# Patient Record
Sex: Female | Born: 1940
Health system: Southern US, Community
[De-identification: ages and names within clinical notes are randomized; demographics above are authoritative.]

## PROBLEM LIST (undated history)

## (undated) DIAGNOSIS — C50919 Malignant neoplasm of unspecified site of unspecified female breast: Secondary | ICD-10-CM

---

## 2020-02-27 ENCOUNTER — Other Ambulatory Visit: Payer: Self-pay

## 2020-02-27 ENCOUNTER — Encounter (HOSPITAL_COMMUNITY): Payer: Self-pay | Admitting: Family Medicine

## 2020-02-27 ENCOUNTER — Emergency Department (HOSPITAL_COMMUNITY): Payer: Medicare HMO

## 2020-02-27 ENCOUNTER — Inpatient Hospital Stay (HOSPITAL_COMMUNITY)
Admission: EM | Admit: 2020-02-27 | Discharge: 2020-03-05 | DRG: 579 | Disposition: A | Discharge status: Home Health | Payer: Medicare HMO | Attending: Internal Medicine | Admitting: Internal Medicine

## 2020-02-27 DIAGNOSIS — R627 Adult failure to thrive: Secondary | ICD-10-CM | POA: Diagnosis present

## 2020-02-27 DIAGNOSIS — R64 Cachexia: Secondary | ICD-10-CM | POA: Diagnosis not present

## 2020-02-27 DIAGNOSIS — J9811 Atelectasis: Secondary | ICD-10-CM | POA: Diagnosis present

## 2020-02-27 DIAGNOSIS — L89151 Pressure ulcer of sacral region, stage 1: Secondary | ICD-10-CM | POA: Diagnosis present

## 2020-02-27 DIAGNOSIS — E871 Hypo-osmolality and hyponatremia: Secondary | ICD-10-CM | POA: Diagnosis present

## 2020-02-27 DIAGNOSIS — C770 Secondary and unspecified malignant neoplasm of lymph nodes of head, face and neck: Secondary | ICD-10-CM | POA: Diagnosis present

## 2020-02-27 DIAGNOSIS — E876 Hypokalemia: Secondary | ICD-10-CM | POA: Diagnosis present

## 2020-02-27 DIAGNOSIS — C799 Secondary malignant neoplasm of unspecified site: Secondary | ICD-10-CM

## 2020-02-27 DIAGNOSIS — C7951 Secondary malignant neoplasm of bone: Secondary | ICD-10-CM | POA: Diagnosis present

## 2020-02-27 DIAGNOSIS — E162 Hypoglycemia, unspecified: Secondary | ICD-10-CM | POA: Diagnosis present

## 2020-02-27 DIAGNOSIS — I248 Other forms of acute ischemic heart disease: Secondary | ICD-10-CM | POA: Diagnosis present

## 2020-02-27 DIAGNOSIS — R778 Other specified abnormalities of plasma proteins: Secondary | ICD-10-CM | POA: Diagnosis not present

## 2020-02-27 DIAGNOSIS — Z800 Family history of malignant neoplasm of digestive organs: Secondary | ICD-10-CM

## 2020-02-27 DIAGNOSIS — R944 Abnormal results of kidney function studies: Secondary | ICD-10-CM | POA: Diagnosis present

## 2020-02-27 DIAGNOSIS — R000 Tachycardia, unspecified: Secondary | ICD-10-CM | POA: Diagnosis present

## 2020-02-27 DIAGNOSIS — E860 Dehydration: Secondary | ICD-10-CM | POA: Diagnosis present

## 2020-02-27 DIAGNOSIS — Z7189 Other specified counseling: Secondary | ICD-10-CM | POA: Diagnosis not present

## 2020-02-27 DIAGNOSIS — Z681 Body mass index (BMI) 19 or less, adult: Secondary | ICD-10-CM

## 2020-02-27 DIAGNOSIS — Z602 Problems related to living alone: Secondary | ICD-10-CM | POA: Diagnosis present

## 2020-02-27 DIAGNOSIS — R7989 Other specified abnormal findings of blood chemistry: Secondary | ICD-10-CM | POA: Diagnosis present

## 2020-02-27 DIAGNOSIS — R54 Age-related physical debility: Secondary | ICD-10-CM | POA: Diagnosis present

## 2020-02-27 DIAGNOSIS — L8990 Pressure ulcer of unspecified site, unspecified stage: Secondary | ICD-10-CM | POA: Insufficient documentation

## 2020-02-27 DIAGNOSIS — C801 Malignant (primary) neoplasm, unspecified: Secondary | ICD-10-CM | POA: Diagnosis not present

## 2020-02-27 DIAGNOSIS — Z66 Do not resuscitate: Secondary | ICD-10-CM | POA: Diagnosis not present

## 2020-02-27 DIAGNOSIS — Z8249 Family history of ischemic heart disease and other diseases of the circulatory system: Secondary | ICD-10-CM

## 2020-02-27 DIAGNOSIS — E872 Acidosis: Secondary | ICD-10-CM | POA: Diagnosis present

## 2020-02-27 DIAGNOSIS — G9389 Other specified disorders of brain: Secondary | ICD-10-CM | POA: Diagnosis not present

## 2020-02-27 DIAGNOSIS — N6320 Unspecified lump in the left breast, unspecified quadrant: Secondary | ICD-10-CM | POA: Diagnosis present

## 2020-02-27 DIAGNOSIS — N6332 Unspecified lump in axillary tail of the left breast: Secondary | ICD-10-CM | POA: Diagnosis not present

## 2020-02-27 DIAGNOSIS — E43 Unspecified severe protein-calorie malnutrition: Secondary | ICD-10-CM | POA: Diagnosis present

## 2020-02-27 DIAGNOSIS — R0602 Shortness of breath: Secondary | ICD-10-CM | POA: Diagnosis not present

## 2020-02-27 DIAGNOSIS — R531 Weakness: Secondary | ICD-10-CM

## 2020-02-27 DIAGNOSIS — Z20822 Contact with and (suspected) exposure to covid-19: Secondary | ICD-10-CM | POA: Diagnosis not present

## 2020-02-27 DIAGNOSIS — R2232 Localized swelling, mass and lump, left upper limb: Secondary | ICD-10-CM | POA: Diagnosis not present

## 2020-02-27 DIAGNOSIS — C773 Secondary and unspecified malignant neoplasm of axilla and upper limb lymph nodes: Secondary | ICD-10-CM | POA: Diagnosis not present

## 2020-02-27 DIAGNOSIS — C787 Secondary malignant neoplasm of liver and intrahepatic bile duct: Secondary | ICD-10-CM | POA: Diagnosis present

## 2020-02-27 DIAGNOSIS — C50912 Malignant neoplasm of unspecified site of left female breast: Principal | ICD-10-CM | POA: Diagnosis present

## 2020-02-27 DIAGNOSIS — Z170 Estrogen receptor positive status [ER+]: Secondary | ICD-10-CM | POA: Diagnosis not present

## 2020-02-27 DIAGNOSIS — C7801 Secondary malignant neoplasm of right lung: Secondary | ICD-10-CM | POA: Diagnosis not present

## 2020-02-27 DIAGNOSIS — Z803 Family history of malignant neoplasm of breast: Secondary | ICD-10-CM

## 2020-02-27 DIAGNOSIS — R109 Unspecified abdominal pain: Secondary | ICD-10-CM | POA: Diagnosis not present

## 2020-02-27 DIAGNOSIS — K5900 Constipation, unspecified: Secondary | ICD-10-CM | POA: Diagnosis present

## 2020-02-27 DIAGNOSIS — R100 Acute abdomen: Secondary | ICD-10-CM | POA: Diagnosis not present

## 2020-02-27 DIAGNOSIS — J90 Pleural effusion, not elsewhere classified: Secondary | ICD-10-CM | POA: Diagnosis present

## 2020-02-27 DIAGNOSIS — J910 Malignant pleural effusion: Secondary | ICD-10-CM | POA: Diagnosis present

## 2020-02-27 DIAGNOSIS — M5431 Sciatica, right side: Secondary | ICD-10-CM | POA: Diagnosis present

## 2020-02-27 DIAGNOSIS — C7989 Secondary malignant neoplasm of other specified sites: Secondary | ICD-10-CM | POA: Diagnosis not present

## 2020-02-27 DIAGNOSIS — C50919 Malignant neoplasm of unspecified site of unspecified female breast: Secondary | ICD-10-CM | POA: Diagnosis not present

## 2020-02-27 DIAGNOSIS — J984 Other disorders of lung: Secondary | ICD-10-CM | POA: Diagnosis present

## 2020-02-27 DIAGNOSIS — Z515 Encounter for palliative care: Secondary | ICD-10-CM | POA: Diagnosis not present

## 2020-02-27 DIAGNOSIS — Z823 Family history of stroke: Secondary | ICD-10-CM

## 2020-02-27 DIAGNOSIS — Z789 Other specified health status: Secondary | ICD-10-CM | POA: Diagnosis not present

## 2020-02-27 DIAGNOSIS — C3490 Malignant neoplasm of unspecified part of unspecified bronchus or lung: Secondary | ICD-10-CM | POA: Diagnosis not present

## 2020-02-27 DIAGNOSIS — S2231XA Fracture of one rib, right side, initial encounter for closed fracture: Secondary | ICD-10-CM | POA: Diagnosis not present

## 2020-02-27 DIAGNOSIS — R079 Chest pain, unspecified: Secondary | ICD-10-CM | POA: Diagnosis not present

## 2020-02-27 DIAGNOSIS — F40240 Claustrophobia: Secondary | ICD-10-CM | POA: Diagnosis present

## 2020-02-27 DIAGNOSIS — J449 Chronic obstructive pulmonary disease, unspecified: Secondary | ICD-10-CM | POA: Diagnosis not present

## 2020-02-27 LAB — HEPATIC FUNCTION PANEL
ALT: 30 U/L (ref 0–44)
AST: 154 U/L — ABNORMAL HIGH (ref 15–41)
Albumin: 2.6 g/dL — ABNORMAL LOW (ref 3.5–5.0)
Alkaline Phosphatase: 652 U/L — ABNORMAL HIGH (ref 38–126)
Bilirubin, Direct: 0.3 mg/dL — ABNORMAL HIGH (ref 0.0–0.2)
Indirect Bilirubin: 0.3 mg/dL (ref 0.3–0.9)
Total Bilirubin: 0.6 mg/dL (ref 0.3–1.2)
Total Protein: 6.4 g/dL — ABNORMAL LOW (ref 6.5–8.1)

## 2020-02-27 LAB — BASIC METABOLIC PANEL
Anion gap: 16 — ABNORMAL HIGH (ref 5–15)
BUN: 31 mg/dL — ABNORMAL HIGH (ref 8–23)
CO2: 21 mmol/L — ABNORMAL LOW (ref 22–32)
Calcium: 11.1 mg/dL — ABNORMAL HIGH (ref 8.9–10.3)
Chloride: 97 mmol/L — ABNORMAL LOW (ref 98–111)
Creatinine, Ser: 0.95 mg/dL (ref 0.44–1.00)
GFR, Estimated: >60 mL/min (ref >60)
Glucose, Bld: 110 mg/dL — ABNORMAL HIGH (ref 70–99)
Potassium: 4.1 mmol/L (ref 3.5–5.1)
Sodium: 134 mmol/L — ABNORMAL LOW (ref 135–145)

## 2020-02-27 LAB — TSH: TSH: 3.188 u[IU]/mL (ref 0.350–4.500)

## 2020-02-27 LAB — TROPONIN I (HIGH SENSITIVITY)
Troponin I (High Sensitivity): 18 ng/L — ABNORMAL HIGH (ref <18)
Troponin I (High Sensitivity): 18 ng/L — ABNORMAL HIGH (ref <18)

## 2020-02-27 LAB — CBC
HCT: 40.9 % (ref 36.0–46.0)
Hemoglobin: 13.5 g/dL (ref 12.0–15.0)
MCH: 27.4 pg (ref 26.0–34.0)
MCHC: 33 g/dL (ref 30.0–36.0)
MCV: 83.1 fL (ref 80.0–100.0)
Platelets: 394 10*3/uL (ref 150–400)
RBC: 4.92 MIL/uL (ref 3.87–5.11)
RDW: 21 % — ABNORMAL HIGH (ref 11.5–15.5)
WBC: 9.4 10*3/uL (ref 4.0–10.5)
nRBC: 0 % (ref 0.0–0.2)

## 2020-02-27 LAB — BRAIN NATRIURETIC PEPTIDE: B Natriuretic Peptide: 71.5 pg/mL (ref 0.0–100.0)

## 2020-02-27 LAB — SARS CORONAVIRUS 2 BY RT PCR (HOSPITAL ORDER, PERFORMED IN ~~LOC~~ HOSPITAL LAB): SARS Coronavirus 2: NEGATIVE

## 2020-02-27 LAB — POC SARS CORONAVIRUS 2 AG -  ED: SARS Coronavirus 2 Ag: NEGATIVE

## 2020-02-27 MED ORDER — ONDANSETRON HCL 4 MG/2ML IJ SOLN: 4.0000 mg | Freq: Four times a day (QID) | INTRAMUSCULAR | Status: DC | PRN

## 2020-02-27 MED ORDER — HYDROCODONE-ACETAMINOPHEN 5-325 MG PO TABS: 1.0000 | ORAL_TABLET | ORAL | Status: DC | PRN

## 2020-02-27 MED ORDER — ONDANSETRON HCL 4 MG PO TABS: 4.0000 mg | ORAL_TABLET | Freq: Four times a day (QID) | ORAL | Status: DC | PRN

## 2020-02-27 MED ORDER — SODIUM CHLORIDE 0.9 % IV SOLN: 250.0000 mL | INTRAVENOUS | Status: DC | PRN

## 2020-02-27 MED ORDER — IOHEXOL 350 MG/ML SOLN
100.0000 mL | Freq: Once | INTRAVENOUS | Status: AC | PRN
  Administered 2020-02-27: 100 mL via INTRAVENOUS

## 2020-02-27 MED ORDER — POLYETHYLENE GLYCOL 3350 17 G PO PACK
17.0000 g | PACK | Freq: Every day | ORAL | Status: DC | PRN
  Administered 2020-03-02: 17 g via ORAL

## 2020-02-27 MED ORDER — ACETAMINOPHEN 650 MG RE SUPP: 650.0000 mg | Freq: Four times a day (QID) | RECTAL | Status: DC | PRN

## 2020-02-27 MED ORDER — FUROSEMIDE 10 MG/ML IJ SOLN
20.0000 mg | Freq: Once | INTRAMUSCULAR | Status: AC
  Administered 2020-02-27: 20 mg via INTRAVENOUS
  Filled 2020-02-27: qty 2

## 2020-02-27 MED ORDER — ACETAMINOPHEN 325 MG PO TABS
650.0000 mg | ORAL_TABLET | Freq: Four times a day (QID) | ORAL | Status: DC | PRN
  Administered 2020-03-01 – 2020-03-04 (×6): 650 mg via ORAL
  Filled 2020-02-27 (×5): qty 2

## 2020-02-27 MED ORDER — SODIUM CHLORIDE 0.9% FLUSH
3.0000 mL | Freq: Two times a day (BID) | INTRAVENOUS | Status: DC
  Administered 2020-02-27 – 2020-03-05 (×10): 3 mL via INTRAVENOUS

## 2020-02-27 NOTE — ED Provider Notes (Signed)
Emergency Department Provider Note   I have reviewed the triage vital signs and the nursing notes.   HISTORY  Chief Complaint Weakness   HPI Morgan Yu is a 80 y.o. female with no known past medical history presents to the emergency department with increased generalized weakness, left arm swelling, breast mass.  Patient tells me that she is not seen a primary care doctor in many years.  She has some sciatica type pain in the right leg which is unchanged today.  She denies feeling weakness or numbness in this leg.  No bowel or bladder incontinence.  No fevers or chills.  Is not having pain in her chest or shortness of breath.  No abdominal pain.  She does have some constipation symptoms which is typical for her.  No blood or black in the bowel movement.  She does not take any prescription medications.  She states 2 weeks ago she noticed some itching on the left breast and then a mass there developed.  She subsequently developed swelling in the left upper extremity. Notes that the left breast is now firm but not tender.     History reviewed. No pertinent past medical history.  Patient Active Problem List   Diagnosis Date Noted  . Metastatic disease ([place]) 02/27/2020  . Elevated LFTs 02/27/2020  . Elevated troponin 02/27/2020   Allergies Patient has no known allergies.  Family History  Problem Relation Age of Onset  . Stroke Mother   . Heart attack Father   . Cancer Brother     Social History Social History   Tobacco Use  . Smoking status: Never Smoker  . Smokeless tobacco: Never Used  Substance Use Topics  . Alcohol use: Not Currently  . Drug use: Never    Review of Systems  Constitutional: No fever/chills. Positive generalized weakness.  Eyes: No visual changes. ENT: No sore throat. Cardiovascular: Denies chest pain. Unilateral LUE edema.  Respiratory: Denies shortness of breath. Gastrointestinal: No abdominal pain.  No nausea, no vomiting.  No diarrhea.  No  constipation. Genitourinary: Negative for dysuria. Musculoskeletal: Negative for back pain. Skin: Itching and rash to the left breast.  Neurological: Negative for headaches, focal weakness or numbness.  10-point ROS otherwise negative.  ____________________________________________   PHYSICAL EXAM:  VITAL SIGNS: ED Triage Vitals  Enc Vitals Group     BP 02/27/20 1521 (!) 160/80     Pulse Rate 02/27/20 1521 (!) 118     Resp 02/27/20 1521 15     Temp 02/27/20 1521 98.5 F (36.9 C)     Temp Source 02/27/20 1521 Oral     SpO2 02/27/20 1521 96 %   Constitutional: Alert and oriented. Well appearing and in no acute distress. Eyes: Conjunctivae are normal.  Head: Atraumatic. Nose: No congestion/rhinnorhea. Mouth/Throat: Mucous membranes are slightly dry.  Neck: No stridor.   Cardiovascular: Mild tachycardia. Good peripheral circulation. Grossly normal heart sounds.  There is significant pitting edema in the left upper extremity through the entire arm with no associated cellulitis.  Respiratory: Normal respiratory effort.  No retractions. Lungs CTAB. Gastrointestinal: Soft and nontender. No distention.  Musculoskeletal: No lower extremity tenderness nor edema. No gross deformities of extremities. Neurologic:  Normal speech and language.  Normal strength and sensation of the bilateral lower extremities including the right lower extremity. Skin:  Skin is warm and dry.  Nonblanching rash with very firm left breast.  There is some changes to the skin over the nipple with mild inversion. No  areas of necrosis.   ____________________________________________   LABS (all labs ordered are listed, but only abnormal results are displayed)  Labs Reviewed  BASIC METABOLIC PANEL - Abnormal; Notable for the following components:      Result Value   Sodium 134 (*)    Chloride 97 (*)    CO2 21 (*)    Glucose, Bld 110 (*)    BUN 31 (*)    Calcium 11.1 (*)    Anion gap 16 (*)    All other  components within normal limits  CBC - Abnormal; Notable for the following components:   RDW 21.0 (*)    All other components within normal limits  HEPATIC FUNCTION PANEL - Abnormal; Notable for the following components:   Total Protein 6.4 (*)    Albumin 2.6 (*)    AST 154 (*)    Alkaline Phosphatase 652 (*)    Bilirubin, Direct 0.3 (*)    All other components within normal limits  TROPONIN I (HIGH SENSITIVITY) - Abnormal; Notable for the following components:   Troponin I (High Sensitivity) 18 (*)    All other components within normal limits  TROPONIN I (HIGH SENSITIVITY) - Abnormal; Notable for the following components:   Troponin I (High Sensitivity) 18 (*)    All other components within normal limits  SARS CORONAVIRUS 2 BY RT PCR (HOSPITAL ORDER, [facility])  TSH  BRAIN NATRIURETIC PEPTIDE  COMPREHENSIVE METABOLIC PANEL  CBC  POC SARS CORONAVIRUS 2 AG -  ED   ____________________________________________  EKG   EKG Interpretation  Date/Time:  Wednesday February 27 2020 18:23:00 EST Ventricular Rate:  104 PR Interval:  126 QRS Duration: 82 QT Interval:  276 QTC Calculation: 363 R Axis:   82 Text Interpretation: Sinus tachycardia Atrial premature complex Anterior infarct, old Repol abnrm suggests ischemia, inferior leads No STEMI Confirmed by Nanda Quinton [phone]) on 02/27/2020 6:26:51 PM       ____________________________________________  RADIOLOGY  CT Angio Chest PE W and/or Wo Contrast  Result Date: 02/27/2020 CLINICAL DATA:  Acute abdominal pain. Elevated liver function test. Suspected breast cancer. Query metastasis. Query shortness of breath. Pulmonary embolus suspected with high probability. EXAM: CT ANGIOGRAPHY CHEST CT ABDOMEN AND PELVIS WITH CONTRAST TECHNIQUE: Multidetector CT imaging of the chest was performed using the standard protocol during bolus administration of intravenous contrast. Multiplanar CT image reconstructions and  MIPs were obtained to evaluate the vascular anatomy. Multidetector CT imaging of the abdomen and pelvis was performed using the standard protocol during bolus administration of intravenous contrast. CONTRAST:  115mL OMNIPAQUE IOHEXOL 350 MG/ML SOLN COMPARISON:  Chest radiograph 02/27/2020 FINDINGS: CTA CHEST FINDINGS Cardiovascular: Satisfactory opacification of the pulmonary arteries to the segmental level. No evidence of pulmonary embolism. Normal heart size. No pericardial effusion. Normal caliber thoracic aorta. No aortic dissection. Mediastinum/Nodes: Esophagus is decompressed. No significant lymphadenopathy in the mediastinum. Multiple enlarged lymph nodes in the left axilla, measuring up to 2.5 x 3.3 cm in diameter, likely metastatic. Lungs/Pleura: Moderate bilateral pleural effusions. Atelectasis in the lung bases with patchy airspace infiltrates in the remaining lungs. This could represent edema or pneumonia. Noncalcified nodule in the right middle lung measuring 9 mm in diameter, likely metastatic. Additional smaller nodules demonstrated in the right lower lung. Evaluation is limited due to motion artifact. Musculoskeletal: Diffuse skeletal metastasis with mixed sclerotic and lucent lesions throughout the visualized skeletal system. Review of the MIP images confirms the above findings. CT ABDOMEN and PELVIS FINDINGS Hepatobiliary:  Multiple low-attenuation peripherally enhancing lesions demonstrated throughout the liver consistent with diffuse hepatic metastasis. Portal veins are patent. Gallbladder and bile ducts are unremarkable. Pancreas: Unremarkable. No pancreatic ductal dilatation or surrounding inflammatory changes. Spleen: Normal in size without focal abnormality. Adrenals/Urinary Tract: Adrenal glands are unremarkable. Kidneys are normal, without renal calculi, focal lesion, or hydronephrosis. Bladder is unremarkable. Stomach/Bowel: Stomach is within normal limits. Appendix appears normal. No  evidence of bowel wall thickening, distention, or inflammatory changes. Vascular/Lymphatic: Aortic atherosclerosis. No enlarged abdominal or pelvic lymph nodes. Reproductive: Uterus and bilateral adnexa are unremarkable. Other: Small amount of free fluid in the abdomen and pelvis, possibly reactive or may indicate peritoneal metastasis. Prominent upper abdominal varices. Edema in the subcutaneous fat throughout the abdominal wall. No free air. Musculoskeletal: Diffuse skeletal metastasis with mixed sclerotic and lucent lesions for about the visualized skeletal elements. Review of the MIP images confirms the above findings. IMPRESSION: 1. No evidence of significant pulmonary embolus. 2. Moderate bilateral pleural effusions with basilar atelectasis. Patchy airspace infiltrates in the remaining lungs may represent edema or pneumonia. 3. Noncalcified nodules in the right middle lung and right lower lung, likely metastatic. 4. Diffuse hepatic metastasis. 5. Diffuse skeletal metastasis. 6. Prominent upper abdominal varices. 7. Small amount of free fluid in the abdomen and pelvis, may be reactive or may indicate peritoneal metastasis. 8. Edema in the subcutaneous fat throughout the abdominal wall. Aortic Atherosclerosis (ICD10-I70.0). Electronically Signed   By: Lucienne Capers M.D.   On: 02/27/2020 19:40   CT ABDOMEN PELVIS W CONTRAST  Result Date: 02/27/2020 CLINICAL DATA:  Acute abdominal pain. Elevated liver function test. Suspected breast cancer. Query metastasis. Query shortness of breath. Pulmonary embolus suspected with high probability. EXAM: CT ANGIOGRAPHY CHEST CT ABDOMEN AND PELVIS WITH CONTRAST TECHNIQUE: Multidetector CT imaging of the chest was performed using the standard protocol during bolus administration of intravenous contrast. Multiplanar CT image reconstructions and MIPs were obtained to evaluate the vascular anatomy. Multidetector CT imaging of the abdomen and pelvis was performed using the  standard protocol during bolus administration of intravenous contrast. CONTRAST:  143mL OMNIPAQUE IOHEXOL 350 MG/ML SOLN COMPARISON:  Chest radiograph 02/27/2020 FINDINGS: CTA CHEST FINDINGS Cardiovascular: Satisfactory opacification of the pulmonary arteries to the segmental level. No evidence of pulmonary embolism. Normal heart size. No pericardial effusion. Normal caliber thoracic aorta. No aortic dissection. Mediastinum/Nodes: Esophagus is decompressed. No significant lymphadenopathy in the mediastinum. Multiple enlarged lymph nodes in the left axilla, measuring up to 2.5 x 3.3 cm in diameter, likely metastatic. Lungs/Pleura: Moderate bilateral pleural effusions. Atelectasis in the lung bases with patchy airspace infiltrates in the remaining lungs. This could represent edema or pneumonia. Noncalcified nodule in the right middle lung measuring 9 mm in diameter, likely metastatic. Additional smaller nodules demonstrated in the right lower lung. Evaluation is limited due to motion artifact. Musculoskeletal: Diffuse skeletal metastasis with mixed sclerotic and lucent lesions throughout the visualized skeletal system. Review of the MIP images confirms the above findings. CT ABDOMEN and PELVIS FINDINGS Hepatobiliary: Multiple low-attenuation peripherally enhancing lesions demonstrated throughout the liver consistent with diffuse hepatic metastasis. Portal veins are patent. Gallbladder and bile ducts are unremarkable. Pancreas: Unremarkable. No pancreatic ductal dilatation or surrounding inflammatory changes. Spleen: Normal in size without focal abnormality. Adrenals/Urinary Tract: Adrenal glands are unremarkable. Kidneys are normal, without renal calculi, focal lesion, or hydronephrosis. Bladder is unremarkable. Stomach/Bowel: Stomach is within normal limits. Appendix appears normal. No evidence of bowel wall thickening, distention, or inflammatory changes. Vascular/Lymphatic: Aortic atherosclerosis. No enlarged  abdominal or pelvic lymph nodes. Reproductive: Uterus and bilateral adnexa are unremarkable. Other: Small amount of free fluid in the abdomen and pelvis, possibly reactive or may indicate peritoneal metastasis. Prominent upper abdominal varices. Edema in the subcutaneous fat throughout the abdominal wall. No free air. Musculoskeletal: Diffuse skeletal metastasis with mixed sclerotic and lucent lesions for about the visualized skeletal elements. Review of the MIP images confirms the above findings. IMPRESSION: 1. No evidence of significant pulmonary embolus. 2. Moderate bilateral pleural effusions with basilar atelectasis. Patchy airspace infiltrates in the remaining lungs may represent edema or pneumonia. 3. Noncalcified nodules in the right middle lung and right lower lung, likely metastatic. 4. Diffuse hepatic metastasis. 5. Diffuse skeletal metastasis. 6. Prominent upper abdominal varices. 7. Small amount of free fluid in the abdomen and pelvis, may be reactive or may indicate peritoneal metastasis. 8. Edema in the subcutaneous fat throughout the abdominal wall. Aortic Atherosclerosis (ICD10-I70.0). Electronically Signed   By: Lucienne Capers M.D.   On: 02/27/2020 19:40   DG Chest Portable 1 View  Result Date: 02/27/2020 CLINICAL DATA:  Shortness of breath EXAM: PORTABLE CHEST 1 VIEW COMPARISON:  None. FINDINGS: There is hyperinflation of the lungs compatible with COPD. Small left pleural effusion with left base atelectasis. Right lung clear. No acute bony abnormality. Multiple old healed right rib fractures. IMPRESSION: Small left pleural effusion with left base atelectasis. COPD. Electronically Signed   By: Rolm Baptise M.D.   On: 02/27/2020 17:56   UE VENOUS DUPLEX ([facility] & [facility] 7 am - 7 pm)  Result Date: 02/27/2020 UPPER VENOUS STUDY  Indications: Swelling of LT forearm/wrist/hand. Limitations: Depth and size of vessels. Comparison Study: No prior studies available. Performing Technologist: Darlin Coco  RDMS  Examination Guidelines: A complete evaluation includes B-mode imaging, spectral Doppler, color Doppler, and power Doppler as needed of all accessible portions of each vessel. Bilateral testing is considered an integral part of a complete examination. Limited examinations for reoccurring indications may be performed as noted.  Right Findings: +----------+------------+---------+-----------+----------+-------+ RIGHT     CompressiblePhasicitySpontaneousPropertiesSummary +----------+------------+---------+-----------+----------+-------+ Subclavian               Yes       Yes                      +----------+------------+---------+-----------+----------+-------+  Left Findings: +----------+------------+---------+-----------+----------+-------+ LEFT      CompressiblePhasicitySpontaneousPropertiesSummary +----------+------------+---------+-----------+----------+-------+ IJV           Full       Yes       Yes                      +----------+------------+---------+-----------+----------+-------+ Subclavian    Full       Yes       Yes                      +----------+------------+---------+-----------+----------+-------+ Axillary      Full       Yes       Yes                      +----------+------------+---------+-----------+----------+-------+ Brachial      Full       Yes       Yes                      +----------+------------+---------+-----------+----------+-------+ Radial        Full                                          +----------+------------+---------+-----------+----------+-------+  Ulnar         Full                                          +----------+------------+---------+-----------+----------+-------+ Cephalic      Full                                          +----------+------------+---------+-----------+----------+-------+ Basilic       Full                                           +----------+------------+---------+-----------+----------+-------+  Summary:  Right: No evidence of thrombosis in the subclavian.  Left: No evidence of deep vein thrombosis in the upper extremity. No evidence of superficial vein thrombosis in the upper extremity.  Indicental: Appearance of enlarged, hypoechoic lymph node with loss of hilar definition in the axilla.  *See table(s) above for measurements and observations.    Preliminary     ____________________________________________   PROCEDURES  Procedure(s) performed:   Procedures  None  ____________________________________________   INITIAL IMPRESSION / ASSESSMENT AND PLAN / ED COURSE  Pertinent labs & imaging results that were available during my care of the patient were reviewed by me and considered in my medical decision making (see chart for details).   Patient with no known prior past history with no current PCP presents with generalized weakness.  She has significant left upper extremity edema with left breast mass concerning for cancer.  Does not appear clinically consistent with abscess.  Patient has tachycardia but no subjective shortness of breath.  No hypoxemia.  Labs from triage include basic metabolic panel and CBC which were unremarkable.  I have added on UA, TSH, troponin, BNP, left upper extremity Doppler along with portable chest x-ray.   Patient's chest x-ray showing pleural effusion.  Imaging ordered of the chest, abdomen, pelvis looking for PE with persistent tachycardia.  No PE but patient does have moderate bilateral pleural effusions likely malignant.  The additional lung fields show some edema versus infiltrate.  Favor edema clinically and have added Lasix.  Patient is not hypoxemic but is persistently tachycardic.   CT abdomen pelvis shows abdominal wall edema along with ascites and multiple areas of metastatic disease in the liver.  I discussed these findings with the patient in detail.  Given her persistent  tachycardia and edema/effusions will admit for Lasix +/- ECHO. Troponin unchanged. Plan for admit.   Discussed patient's case with [facility] to request admission. Patient and family (if present) updated with plan. Care transferred to [facility] service.  I reviewed all nursing notes, vitals, pertinent old records, EKGs, labs, imaging (as available).  ____________________________________________  FINAL CLINICAL IMPRESSION(S) / ED DIAGNOSES  Final diagnoses:  Generalized weakness  Malignant pleural effusion  Metastatic malignant neoplasm, unspecified site (HCC)     MEDICATIONS GIVEN DURING THIS VISIT:  Medications  furosemide (LASIX) injection 20 mg (has no administration in time range)  sodium chloride flush (NS) 0.9 % injection 3 mL (has no administration in time range)  0.9 %  sodium chloride infusion (has no administration in time range)  acetaminophen (TYLENOL) tablet 650 mg (has no administration in time range)    Or  acetaminophen (TYLENOL) suppository 650 mg (has no administration in time range)  HYDROcodone-acetaminophen (NORCO/VICODIN) 5-325 MG per tablet 1 tablet (has no administration in time range)  polyethylene glycol (MIRALAX / GLYCOLAX) packet 17 g (has no administration in time range)  ondansetron (ZOFRAN) tablet 4 mg (has no administration in time range)    Or  ondansetron (ZOFRAN) injection 4 mg (has no administration in time range)  iohexol (OMNIPAQUE) 350 MG/ML injection 100 mL (100 mLs Intravenous Contrast Given 02/27/20 1928)    Note:  This document was prepared using Dragon voice recognition software and may include unintentional dictation errors.  Nanda Quinton, MD, [facility]    Chelsae Zanella, [facility], MD 02/27/20 [phone]

## 2020-02-27 NOTE — ED Triage Notes (Signed)
Pt c/o increasing weakness, reports hx of sciatic pain. Denies cough/fever/headache/chest pain/shortness of breath.

## 2020-02-27 NOTE — H&P (Addendum)
History and Physical    Morgan Yu XTK:[id number] DOB: December 09, 1940 DOA: 02/27/2020  PCP: Patient, No Pcp Per   Patient coming from: Home   Chief Complaint: Left arm swelling, left breast mass   HPI: Morgan Yu is a 80 y.o. female is denies any significant past medical history though has not seen a physician in many years, now presenting to the emergency department with concerns for left breast mass and left arm swelling.  The patient notes that she developed pruritus and later a lump involving the left breast a few months ago and then developed some swelling of the left arm over the past 2 weeks.  She has had 1 mammogram which she states was many years ago.  Never had a colonoscopy.  She denies any chest pain, fevers, chills, leg swelling, cough, or shortness of breath.  ED Course: Upon arrival to the ED, patient is found to be afebrile, saturating well on room air, and tachycardic to 110 with stable blood pressure.  EKG features sinus tachycardia and PAC.  Chemistry panel notable for alkaline phosphatase of 652, albumin 2.6, and AST 154.  CBC is unremarkable.  High-sensitivity troponin is 18 twice.  BNP is normal.  CTA chest is negative for PE but notable for moderate bilateral pleural effusions, patchy airspace opacities, right middle and right lower lobe nodules concerning for metastatic disease, as well as diffuse hepatic and skeletal metastases.  Ultrasound of the left upper extremity is negative for DVT but notable for enlarged axillary node.  Patient was given 20 mg IV Lasix in the ED.  COVID-19 PCR is pending.  Review of Systems:  All other systems reviewed and apart from HPI, are negative.  History reviewed. No pertinent past medical history.  History reviewed. No pertinent surgical history.  Social History:   reports that she has never smoked. She has never used smokeless tobacco. She reports previous alcohol use. She reports that she does not use drugs.  No Known  Allergies  Family History  Problem Relation Age of Onset  . Stroke Mother   . Heart attack Father   . Cancer Brother      Prior to Admission medications   Not on File    Physical Exam: Vitals:   02/27/20 1815 02/27/20 1830 02/27/20 1845 02/27/20 1915  BP: (!) 142/74 (!) 147/66 (!) 144/69 (!) 166/78  Pulse: (!) 107 (!) 105 (!) 103 (!) 114  Resp: (!) 22 (!) 23 19 (!) 23  Temp:      TempSrc:      SpO2: 95% 93% 93% 96%    Constitutional: NAD, calm  Eyes: PERTLA, lids and conjunctivae normal ENMT: Mucous membranes are moist. Posterior pharynx clear of any exudate or lesions.   Neck: normal, supple, no masses, no thyromegaly Respiratory: Diminished bilaterally. No wheezing. No accessory muscle use.  Cardiovascular: Rate ~110 and regular. Left arm swelling.   Abdomen: No distension, no tenderness, soft. Bowel sounds active.  Musculoskeletal: no clubbing / cyanosis. No joint deformity upper and lower extremities.   Skin: Induration and edema to left arm. Warm, dry, well-perfused. Neurologic: CN 2-12 grossly intact. Sensation intact. Moving all extremities.  Psychiatric: Alert and oriented to person, place, and situation. Pleasant and cooperative.    Labs and Imaging on Admission: I have personally reviewed following labs and imaging studies  CBC: Recent Labs  Lab 02/27/20 1537  WBC 9.4  HGB 13.5  HCT 40.9  MCV 83.1  PLT 992   Basic Metabolic Panel:  Recent Labs  Lab 02/27/20 1537  NA 134*  K 4.1  CL 97*  CO2 21*  GLUCOSE 110*  BUN 31*  CREATININE 0.95  CALCIUM 11.1*   GFR: CrCl cannot be calculated (Unknown ideal weight.). Liver Function Tests: Recent Labs  Lab 02/27/20 1721  AST 154*  ALT 30  ALKPHOS 652*  BILITOT 0.6  PROT 6.4*  ALBUMIN 2.6*   No results for input(s): LIPASE, AMYLASE in the last 168 hours. No results for input(s): AMMONIA in the last 168 hours. Coagulation Profile: No results for input(s): INR, PROTIME in the last 168  hours. Cardiac Enzymes: No results for input(s): CKTOTAL, CKMB, CKMBINDEX, TROPONINI in the last 168 hours. BNP (last 3 results) No results for input(s): PROBNP in the last 8760 hours. HbA1C: No results for input(s): HGBA1C in the last 72 hours. CBG: No results for input(s): GLUCAP in the last 168 hours. Lipid Profile: No results for input(s): CHOL, HDL, LDLCALC, TRIG, CHOLHDL, LDLDIRECT in the last 72 hours. Thyroid Function Tests: Recent Labs    02/27/20 1721  TSH 3.188   Anemia Panel: No results for input(s): VITAMINB12, FOLATE, FERRITIN, TIBC, IRON, RETICCTPCT in the last 72 hours. Urine analysis: No results found for: COLORURINE, APPEARANCEUR, LABSPEC, PHURINE, GLUCOSEU, HGBUR, BILIRUBINUR, KETONESUR, PROTEINUR, UROBILINOGEN, NITRITE, LEUKOCYTESUR Sepsis Labs: @LABRCNTIP (procalcitonin:4,lacticidven:4) )No results found for this or any previous visit (from the past 240 hour(s)).   Radiological Exams on Admission: CT Angio Chest PE W and/or Wo Contrast  Result Date: 02/27/2020 CLINICAL DATA:  Acute abdominal pain. Elevated liver function test. Suspected breast cancer. Query metastasis. Query shortness of breath. Pulmonary embolus suspected with high probability. EXAM: CT ANGIOGRAPHY CHEST CT ABDOMEN AND PELVIS WITH CONTRAST TECHNIQUE: Multidetector CT imaging of the chest was performed using the standard protocol during bolus administration of intravenous contrast. Multiplanar CT image reconstructions and MIPs were obtained to evaluate the vascular anatomy. Multidetector CT imaging of the abdomen and pelvis was performed using the standard protocol during bolus administration of intravenous contrast. CONTRAST:  155mL OMNIPAQUE IOHEXOL 350 MG/ML SOLN COMPARISON:  Chest radiograph 02/27/2020 FINDINGS: CTA CHEST FINDINGS Cardiovascular: Satisfactory opacification of the pulmonary arteries to the segmental level. No evidence of pulmonary embolism. Normal heart size. No pericardial  effusion. Normal caliber thoracic aorta. No aortic dissection. Mediastinum/Nodes: Esophagus is decompressed. No significant lymphadenopathy in the mediastinum. Multiple enlarged lymph nodes in the left axilla, measuring up to 2.5 x 3.3 cm in diameter, likely metastatic. Lungs/Pleura: Moderate bilateral pleural effusions. Atelectasis in the lung bases with patchy airspace infiltrates in the remaining lungs. This could represent edema or pneumonia. Noncalcified nodule in the right middle lung measuring 9 mm in diameter, likely metastatic. Additional smaller nodules demonstrated in the right lower lung. Evaluation is limited due to motion artifact. Musculoskeletal: Diffuse skeletal metastasis with mixed sclerotic and lucent lesions throughout the visualized skeletal system. Review of the MIP images confirms the above findings. CT ABDOMEN and PELVIS FINDINGS Hepatobiliary: Multiple low-attenuation peripherally enhancing lesions demonstrated throughout the liver consistent with diffuse hepatic metastasis. Portal veins are patent. Gallbladder and bile ducts are unremarkable. Pancreas: Unremarkable. No pancreatic ductal dilatation or surrounding inflammatory changes. Spleen: Normal in size without focal abnormality. Adrenals/Urinary Tract: Adrenal glands are unremarkable. Kidneys are normal, without renal calculi, focal lesion, or hydronephrosis. Bladder is unremarkable. Stomach/Bowel: Stomach is within normal limits. Appendix appears normal. No evidence of bowel wall thickening, distention, or inflammatory changes. Vascular/Lymphatic: Aortic atherosclerosis. No enlarged abdominal or pelvic lymph nodes. Reproductive: Uterus and bilateral adnexa are unremarkable.  Other: Small amount of free fluid in the abdomen and pelvis, possibly reactive or may indicate peritoneal metastasis. Prominent upper abdominal varices. Edema in the subcutaneous fat throughout the abdominal wall. No free air. Musculoskeletal: Diffuse skeletal  metastasis with mixed sclerotic and lucent lesions for about the visualized skeletal elements. Review of the MIP images confirms the above findings. IMPRESSION: 1. No evidence of significant pulmonary embolus. 2. Moderate bilateral pleural effusions with basilar atelectasis. Patchy airspace infiltrates in the remaining lungs may represent edema or pneumonia. 3. Noncalcified nodules in the right middle lung and right lower lung, likely metastatic. 4. Diffuse hepatic metastasis. 5. Diffuse skeletal metastasis. 6. Prominent upper abdominal varices. 7. Small amount of free fluid in the abdomen and pelvis, may be reactive or may indicate peritoneal metastasis. 8. Edema in the subcutaneous fat throughout the abdominal wall. Aortic Atherosclerosis (ICD10-I70.0). Electronically Signed   By: Lucienne Capers M.D.   On: 02/27/2020 19:40   CT ABDOMEN PELVIS W CONTRAST  Result Date: 02/27/2020 CLINICAL DATA:  Acute abdominal pain. Elevated liver function test. Suspected breast cancer. Query metastasis. Query shortness of breath. Pulmonary embolus suspected with high probability. EXAM: CT ANGIOGRAPHY CHEST CT ABDOMEN AND PELVIS WITH CONTRAST TECHNIQUE: Multidetector CT imaging of the chest was performed using the standard protocol during bolus administration of intravenous contrast. Multiplanar CT image reconstructions and MIPs were obtained to evaluate the vascular anatomy. Multidetector CT imaging of the abdomen and pelvis was performed using the standard protocol during bolus administration of intravenous contrast. CONTRAST:  151mL OMNIPAQUE IOHEXOL 350 MG/ML SOLN COMPARISON:  Chest radiograph 02/27/2020 FINDINGS: CTA CHEST FINDINGS Cardiovascular: Satisfactory opacification of the pulmonary arteries to the segmental level. No evidence of pulmonary embolism. Normal heart size. No pericardial effusion. Normal caliber thoracic aorta. No aortic dissection. Mediastinum/Nodes: Esophagus is decompressed. No significant  lymphadenopathy in the mediastinum. Multiple enlarged lymph nodes in the left axilla, measuring up to 2.5 x 3.3 cm in diameter, likely metastatic. Lungs/Pleura: Moderate bilateral pleural effusions. Atelectasis in the lung bases with patchy airspace infiltrates in the remaining lungs. This could represent edema or pneumonia. Noncalcified nodule in the right middle lung measuring 9 mm in diameter, likely metastatic. Additional smaller nodules demonstrated in the right lower lung. Evaluation is limited due to motion artifact. Musculoskeletal: Diffuse skeletal metastasis with mixed sclerotic and lucent lesions throughout the visualized skeletal system. Review of the MIP images confirms the above findings. CT ABDOMEN and PELVIS FINDINGS Hepatobiliary: Multiple low-attenuation peripherally enhancing lesions demonstrated throughout the liver consistent with diffuse hepatic metastasis. Portal veins are patent. Gallbladder and bile ducts are unremarkable. Pancreas: Unremarkable. No pancreatic ductal dilatation or surrounding inflammatory changes. Spleen: Normal in size without focal abnormality. Adrenals/Urinary Tract: Adrenal glands are unremarkable. Kidneys are normal, without renal calculi, focal lesion, or hydronephrosis. Bladder is unremarkable. Stomach/Bowel: Stomach is within normal limits. Appendix appears normal. No evidence of bowel wall thickening, distention, or inflammatory changes. Vascular/Lymphatic: Aortic atherosclerosis. No enlarged abdominal or pelvic lymph nodes. Reproductive: Uterus and bilateral adnexa are unremarkable. Other: Small amount of free fluid in the abdomen and pelvis, possibly reactive or may indicate peritoneal metastasis. Prominent upper abdominal varices. Edema in the subcutaneous fat throughout the abdominal wall. No free air. Musculoskeletal: Diffuse skeletal metastasis with mixed sclerotic and lucent lesions for about the visualized skeletal elements. Review of the MIP images confirms  the above findings. IMPRESSION: 1. No evidence of significant pulmonary embolus. 2. Moderate bilateral pleural effusions with basilar atelectasis. Patchy airspace infiltrates in the remaining lungs may represent  edema or pneumonia. 3. Noncalcified nodules in the right middle lung and right lower lung, likely metastatic. 4. Diffuse hepatic metastasis. 5. Diffuse skeletal metastasis. 6. Prominent upper abdominal varices. 7. Small amount of free fluid in the abdomen and pelvis, may be reactive or may indicate peritoneal metastasis. 8. Edema in the subcutaneous fat throughout the abdominal wall. Aortic Atherosclerosis (ICD10-I70.0). Electronically Signed   By: Lucienne Capers M.D.   On: 02/27/2020 19:40   DG Chest Portable 1 View  Result Date: 02/27/2020 CLINICAL DATA:  Shortness of breath EXAM: PORTABLE CHEST 1 VIEW COMPARISON:  None. FINDINGS: There is hyperinflation of the lungs compatible with COPD. Small left pleural effusion with left base atelectasis. Right lung clear. No acute bony abnormality. Multiple old healed right rib fractures. IMPRESSION: Small left pleural effusion with left base atelectasis. COPD. Electronically Signed   By: Rolm Baptise M.D.   On: 02/27/2020 17:56   UE VENOUS DUPLEX ([facility] & [facility] 7 am - 7 pm)  Result Date: 02/27/2020 UPPER VENOUS STUDY  Indications: Swelling of LT forearm/wrist/hand. Limitations: Depth and size of vessels. Comparison Study: No prior studies available. Performing Technologist: Darlin Coco RDMS  Examination Guidelines: A complete evaluation includes B-mode imaging, spectral Doppler, color Doppler, and power Doppler as needed of all accessible portions of each vessel. Bilateral testing is considered an integral part of a complete examination. Limited examinations for reoccurring indications may be performed as noted.  Right Findings: +----------+------------+---------+-----------+----------+-------+ RIGHT     CompressiblePhasicitySpontaneousPropertiesSummary  +----------+------------+---------+-----------+----------+-------+ Subclavian               Yes       Yes                      +----------+------------+---------+-----------+----------+-------+  Left Findings: +----------+------------+---------+-----------+----------+-------+ LEFT      CompressiblePhasicitySpontaneousPropertiesSummary +----------+------------+---------+-----------+----------+-------+ IJV           Full       Yes       Yes                      +----------+------------+---------+-----------+----------+-------+ Subclavian    Full       Yes       Yes                      +----------+------------+---------+-----------+----------+-------+ Axillary      Full       Yes       Yes                      +----------+------------+---------+-----------+----------+-------+ Brachial      Full       Yes       Yes                      +----------+------------+---------+-----------+----------+-------+ Radial        Full                                          +----------+------------+---------+-----------+----------+-------+ Ulnar         Full                                          +----------+------------+---------+-----------+----------+-------+ Cephalic      Full                                          +----------+------------+---------+-----------+----------+-------+  Basilic       Full                                          +----------+------------+---------+-----------+----------+-------+  Summary:  Right: No evidence of thrombosis in the subclavian.  Left: No evidence of deep vein thrombosis in the upper extremity. No evidence of superficial vein thrombosis in the upper extremity.  Indicental: Appearance of enlarged, hypoechoic lymph node with loss of hilar definition in the axilla.  *See table(s) above for measurements and observations.    Preliminary     EKG: Independently reviewed. Sinus tachycardia, rate 104, PAC.    Assessment/Plan   1. Metastatic disease  - Presents with ~2 weeks of left arm swelling, does not have DVT or superficial thrombosis but axillary adenopathy was noted on [place] in ED and CT findings consistent with metastatic disease  - Message sent to oncology with request for routine consultation    2. Left arm swelling  - Ruled-out for DVT in ED, likely secondary to lymphatic obstruction  - Elevate arm, supportive care    3. Pleural effusions, ?pulmonary edema  - Albumin is 2.6, BNP normal, likely related to metastatic disease  - She was given 20 mg IV Lasix in ED  - Check echo, consider thoracentesis   4. Elevated troponin  - Borderline troponin elevation noted in ED  - There is no anginal symptoms, unlikely ACS, echo ordered as above   DVT prophylaxis: SCDs  Code Status: Full  Level of Care: medical telemetry  Family Communication: Cousin updated at bedside Disposition Plan:  Patient is from: Home  Anticipated d/c is to: Home  Anticipated d/c date is: 02/28/20 Patient currently: Pending therapy assessment, possible biopsy  Consults called: Message sent to oncology with request for routine consultation   Admission status: Observation     Vianne Bulls, MD [facility]  02/27/2020, 8:52 PM

## 2020-02-27 NOTE — Progress Notes (Signed)
Upper extremity venous LT study completed.  Preliminary results relayed to Long, MD.   See CV Proc for preliminary results report.   Darlin Coco, RDMS

## 2020-02-28 ENCOUNTER — Inpatient Hospital Stay (HOSPITAL_COMMUNITY): Payer: Medicare HMO

## 2020-02-28 ENCOUNTER — Encounter (HOSPITAL_COMMUNITY): Payer: Self-pay | Admitting: Family Medicine

## 2020-02-28 DIAGNOSIS — C787 Secondary malignant neoplasm of liver and intrahepatic bile duct: Secondary | ICD-10-CM

## 2020-02-28 DIAGNOSIS — Z515 Encounter for palliative care: Secondary | ICD-10-CM

## 2020-02-28 DIAGNOSIS — L8990 Pressure ulcer of unspecified site, unspecified stage: Secondary | ICD-10-CM | POA: Insufficient documentation

## 2020-02-28 DIAGNOSIS — R0602 Shortness of breath: Secondary | ICD-10-CM | POA: Diagnosis not present

## 2020-02-28 DIAGNOSIS — Z170 Estrogen receptor positive status [ER+]: Secondary | ICD-10-CM

## 2020-02-28 DIAGNOSIS — N6320 Unspecified lump in the left breast, unspecified quadrant: Secondary | ICD-10-CM

## 2020-02-28 DIAGNOSIS — R531 Weakness: Secondary | ICD-10-CM

## 2020-02-28 DIAGNOSIS — C50912 Malignant neoplasm of unspecified site of left female breast: Principal | ICD-10-CM

## 2020-02-28 DIAGNOSIS — Z7189 Other specified counseling: Secondary | ICD-10-CM

## 2020-02-28 LAB — COMPREHENSIVE METABOLIC PANEL
ALT: 27 U/L (ref 0–44)
AST: 143 U/L — ABNORMAL HIGH (ref 15–41)
Albumin: 2.2 g/dL — ABNORMAL LOW (ref 3.5–5.0)
Alkaline Phosphatase: 590 U/L — ABNORMAL HIGH (ref 38–126)
Anion gap: 16 — ABNORMAL HIGH (ref 5–15)
BUN: 28 mg/dL — ABNORMAL HIGH (ref 8–23)
CO2: 21 mmol/L — ABNORMAL LOW (ref 22–32)
Calcium: 10.7 mg/dL — ABNORMAL HIGH (ref 8.9–10.3)
Chloride: 97 mmol/L — ABNORMAL LOW (ref 98–111)
Creatinine, Ser: 0.89 mg/dL (ref 0.44–1.00)
GFR, Estimated: >60 mL/min (ref >60)
Glucose, Bld: 70 mg/dL (ref 70–99)
Potassium: 3.7 mmol/L (ref 3.5–5.1)
Sodium: 134 mmol/L — ABNORMAL LOW (ref 135–145)
Total Bilirubin: 1.2 mg/dL (ref 0.3–1.2)
Total Protein: 5.7 g/dL — ABNORMAL LOW (ref 6.5–8.1)

## 2020-02-28 LAB — CBC
HCT: 38 % (ref 36.0–46.0)
Hemoglobin: 12.6 g/dL (ref 12.0–15.0)
MCH: 27.6 pg (ref 26.0–34.0)
MCHC: 33.2 g/dL (ref 30.0–36.0)
MCV: 83.3 fL (ref 80.0–100.0)
Platelets: 349 10*3/uL (ref 150–400)
RBC: 4.56 MIL/uL (ref 3.87–5.11)
RDW: 21.2 % — ABNORMAL HIGH (ref 11.5–15.5)
WBC: 9.2 10*3/uL (ref 4.0–10.5)
nRBC: 0 % (ref 0.0–0.2)

## 2020-02-28 LAB — ECHOCARDIOGRAM COMPLETE
AR max vel: 1.93 cm2
AV Peak grad: 12.5 mmHg
Ao pk vel: 1.77 m/s
Area-P 1/2: 5.66 cm2
S' Lateral: 2.4 cm

## 2020-02-28 MED ORDER — PERFLUTREN LIPID MICROSPHERE
1.0000 mL | INTRAVENOUS | Status: AC | PRN
  Administered 2020-02-28: 2 mL via INTRAVENOUS
  Filled 2020-02-28: qty 10

## 2020-02-28 MED ORDER — TRAMADOL HCL 50 MG PO TABS: 50.0000 mg | ORAL_TABLET | Freq: Four times a day (QID) | ORAL | Status: DC | PRN

## 2020-02-28 MED ORDER — LORAZEPAM 2 MG/ML IJ SOLN
0.5000 mg | Freq: Once | INTRAMUSCULAR | Status: AC
  Administered 2020-02-28: 0.5 mg via INTRAVENOUS
  Filled 2020-02-28: qty 1

## 2020-02-28 MED ORDER — GADOBUTROL 1 MMOL/ML IV SOLN
5.0000 mL | Freq: Once | INTRAVENOUS | Status: AC | PRN
  Administered 2020-02-28: 5 mL via INTRAVENOUS

## 2020-02-28 MED ORDER — MIRTAZAPINE 15 MG PO TABS
7.5000 mg | ORAL_TABLET | Freq: Every day | ORAL | Status: DC
  Administered 2020-02-28 – 2020-03-01 (×3): 7.5 mg via ORAL
  Filled 2020-02-28 (×4): qty 1

## 2020-02-28 MED ORDER — FUROSEMIDE 10 MG/ML IJ SOLN
20.0000 mg | Freq: Every day | INTRAMUSCULAR | Status: DC
  Administered 2020-02-29 – 2020-03-02 (×3): 20 mg via INTRAVENOUS
  Filled 2020-02-28 (×3): qty 2

## 2020-02-28 MED ORDER — SODIUM CHLORIDE 0.9 % IV SOLN
60.0000 mg | Freq: Once | INTRAVENOUS | Status: AC
  Administered 2020-02-28: 60 mg via INTRAVENOUS
  Filled 2020-02-28: qty 20

## 2020-02-28 NOTE — ED Notes (Signed)
Pt transported to MRI 

## 2020-02-28 NOTE — Consult Note (Signed)
Chief Complaint: Patient was seen in consultation today for image guided biopsy of liver lesion Chief Complaint  Patient presents with  . Weakness    Referring Physician(s): Iruku,P  Supervising Physician: Corrie Mckusick  Patient Status: [facility] - ED  History of Present Illness: Morgan Yu is a 80 y.o. female  with no significant past medical history but has not seen a physician in many years.  She presented to the [facility] ED 1/26 with weakness.  She has a left breast mass and developed left arm swelling.  The patient thinks that the breast mass and swelling have been present for at least a few weeks to months.  She states that she initially developed pruritus and later a lump involving the left breast.  Left arm swelling has been more recent.  Last mammogram was many years ago.  CTA chest and CT of the abdomen pelvis were performed which showed no evidence of PE but did show moderate bilateral pleural effusions, nodules in the right middle lung and right lower lung likely metastatic, diffuse hepatic metastasis, diffuse skeletal metastasis.LUE venous doppler study yesterday was neg for DVT but showed enlarged axillary node. Request now received from oncology for image guided liver lesion biopsy.   History reviewed. No pertinent past medical history.  History reviewed. No pertinent surgical history.  Allergies: Patient has no known allergies.  Medications: Prior to Admission medications   Medication Sig Start Date End Date Taking? Authorizing Provider  acetaminophen (TYLENOL) 500 MG tablet Take 500 mg by mouth every 6 (six) hours as needed for moderate pain.   Yes [provider]  Probiotic Product (PROBIOTIC PO) Take 1 capsule by mouth daily.   Yes [provider]     Family History  Problem Relation Age of Onset  . Stroke Mother   . Heart attack Father   . Cancer Brother   . Esophageal cancer Maternal Aunt   . Breast cancer Maternal Aunt     Social History    Socioeconomic History  . Marital status: Widowed    Spouse name: Not on file  . Number of children: Not on file  . Years of education: Not on file  . Highest education level: Not on file  Occupational History  . Not on file  Tobacco Use  . Smoking status: Never Smoker  . Smokeless tobacco: Never Used  Substance and Sexual Activity  . Alcohol use: Not Currently  . Drug use: Never  . Sexual activity: Not on file  Other Topics Concern  . Not on file  Social History Narrative   No biological children   Has 2 stepchildren   Social Determinants of Health   Financial Resource Strain: Not on file  Food Insecurity: Not on file  Transportation Needs: Not on file  Physical Activity: Not on file  Stress: Not on file  Social Connections: Not on file      Review of Systems denies fever,HA,CP,dyspnea, cough, abd /back pain,N/V or bleeding  Vital Signs: BP 128/69   Pulse 91   Temp 98.5 F (36.9 C) (Oral)   Resp 19   SpO2 94%   Physical Exam awake, answers questions ok; chest- dim BS bases; heart-RRR; abd- soft,+BS,NT; palpable cervical/left axillary nodes; friable, erythematous skin noted medial left breast(reported left breast mass); no LE edema  Imaging: CT Angio Chest PE W and/or Wo Contrast  Result Date: 02/27/2020 CLINICAL DATA:  Acute abdominal pain. Elevated liver function test. Suspected breast cancer. Query metastasis. Query shortness of  breath. Pulmonary embolus suspected with high probability. EXAM: CT ANGIOGRAPHY CHEST CT ABDOMEN AND PELVIS WITH CONTRAST TECHNIQUE: Multidetector CT imaging of the chest was performed using the standard protocol during bolus administration of intravenous contrast. Multiplanar CT image reconstructions and MIPs were obtained to evaluate the vascular anatomy. Multidetector CT imaging of the abdomen and pelvis was performed using the standard protocol during bolus administration of intravenous contrast. CONTRAST:  172mL OMNIPAQUE IOHEXOL 350  MG/ML SOLN COMPARISON:  Chest radiograph 02/27/2020 FINDINGS: CTA CHEST FINDINGS Cardiovascular: Satisfactory opacification of the pulmonary arteries to the segmental level. No evidence of pulmonary embolism. Normal heart size. No pericardial effusion. Normal caliber thoracic aorta. No aortic dissection. Mediastinum/Nodes: Esophagus is decompressed. No significant lymphadenopathy in the mediastinum. Multiple enlarged lymph nodes in the left axilla, measuring up to 2.5 x 3.3 cm in diameter, likely metastatic. Lungs/Pleura: Moderate bilateral pleural effusions. Atelectasis in the lung bases with patchy airspace infiltrates in the remaining lungs. This could represent edema or pneumonia. Noncalcified nodule in the right middle lung measuring 9 mm in diameter, likely metastatic. Additional smaller nodules demonstrated in the right lower lung. Evaluation is limited due to motion artifact. Musculoskeletal: Diffuse skeletal metastasis with mixed sclerotic and lucent lesions throughout the visualized skeletal system. Review of the MIP images confirms the above findings. CT ABDOMEN and PELVIS FINDINGS Hepatobiliary: Multiple low-attenuation peripherally enhancing lesions demonstrated throughout the liver consistent with diffuse hepatic metastasis. Portal veins are patent. Gallbladder and bile ducts are unremarkable. Pancreas: Unremarkable. No pancreatic ductal dilatation or surrounding inflammatory changes. Spleen: Normal in size without focal abnormality. Adrenals/Urinary Tract: Adrenal glands are unremarkable. Kidneys are normal, without renal calculi, focal lesion, or hydronephrosis. Bladder is unremarkable. Stomach/Bowel: Stomach is within normal limits. Appendix appears normal. No evidence of bowel wall thickening, distention, or inflammatory changes. Vascular/Lymphatic: Aortic atherosclerosis. No enlarged abdominal or pelvic lymph nodes. Reproductive: Uterus and bilateral adnexa are unremarkable. Other: Small amount of  free fluid in the abdomen and pelvis, possibly reactive or may indicate peritoneal metastasis. Prominent upper abdominal varices. Edema in the subcutaneous fat throughout the abdominal wall. No free air. Musculoskeletal: Diffuse skeletal metastasis with mixed sclerotic and lucent lesions for about the visualized skeletal elements. Review of the MIP images confirms the above findings. IMPRESSION: 1. No evidence of significant pulmonary embolus. 2. Moderate bilateral pleural effusions with basilar atelectasis. Patchy airspace infiltrates in the remaining lungs may represent edema or pneumonia. 3. Noncalcified nodules in the right middle lung and right lower lung, likely metastatic. 4. Diffuse hepatic metastasis. 5. Diffuse skeletal metastasis. 6. Prominent upper abdominal varices. 7. Small amount of free fluid in the abdomen and pelvis, may be reactive or may indicate peritoneal metastasis. 8. Edema in the subcutaneous fat throughout the abdominal wall. Aortic Atherosclerosis (ICD10-I70.0). Electronically Signed   By: Lucienne Capers M.D.   On: 02/27/2020 19:40   MR Brain W Wo Contrast  Result Date: 02/28/2020 CLINICAL DATA:  Breast cancer, staging EXAM: MRI HEAD WITHOUT AND WITH CONTRAST TECHNIQUE: Multiplanar, multiecho pulse sequences of the brain and surrounding structures were obtained without and with intravenous contrast. CONTRAST:  12mL GADAVIST GADOBUTROL 1 MMOL/ML IV SOLN COMPARISON:  None. FINDINGS: Brain: There is no acute infarction or intracranial hemorrhage. There is no intracranial mass, mass effect, or edema. There is no hydrocephalus or extra-axial fluid collection. Prominence of the ventricles and sulci reflects minor generalized parenchymal volume loss. Patchy foci of T2 hyperintensity in the supratentorial white matter are nonspecific but probably reflect mild chronic microvascular ischemic changes. Punctate  focus of susceptibility in the right temporal subcortical white matter likely reflects  chronic microhemorrhage. No abnormal enhancement. Vascular: Major vessel flow voids at the skull base are preserved. Skull and upper cervical spine: Abnormal T1 marrow signal in the visualized cervical spine. Patchy abnormal marrow signal the calvarium and skull base. Sinuses/Orbits: Paranasal sinuses are aerated. Orbits are unremarkable. Other: Sella is unremarkable.  Mastoid air cells are clear. IMPRESSION: No evidence of intracranial metastatic disease. Diffuse osseous metastatic disease. Mild chronic microvascular ischemic changes. Electronically Signed   By: Macy Mis M.D.   On: 02/28/2020 13:57   CT ABDOMEN PELVIS W CONTRAST  Result Date: 02/27/2020 CLINICAL DATA:  Acute abdominal pain. Elevated liver function test. Suspected breast cancer. Query metastasis. Query shortness of breath. Pulmonary embolus suspected with high probability. EXAM: CT ANGIOGRAPHY CHEST CT ABDOMEN AND PELVIS WITH CONTRAST TECHNIQUE: Multidetector CT imaging of the chest was performed using the standard protocol during bolus administration of intravenous contrast. Multiplanar CT image reconstructions and MIPs were obtained to evaluate the vascular anatomy. Multidetector CT imaging of the abdomen and pelvis was performed using the standard protocol during bolus administration of intravenous contrast. CONTRAST:  158mL OMNIPAQUE IOHEXOL 350 MG/ML SOLN COMPARISON:  Chest radiograph 02/27/2020 FINDINGS: CTA CHEST FINDINGS Cardiovascular: Satisfactory opacification of the pulmonary arteries to the segmental level. No evidence of pulmonary embolism. Normal heart size. No pericardial effusion. Normal caliber thoracic aorta. No aortic dissection. Mediastinum/Nodes: Esophagus is decompressed. No significant lymphadenopathy in the mediastinum. Multiple enlarged lymph nodes in the left axilla, measuring up to 2.5 x 3.3 cm in diameter, likely metastatic. Lungs/Pleura: Moderate bilateral pleural effusions. Atelectasis in the lung bases with  patchy airspace infiltrates in the remaining lungs. This could represent edema or pneumonia. Noncalcified nodule in the right middle lung measuring 9 mm in diameter, likely metastatic. Additional smaller nodules demonstrated in the right lower lung. Evaluation is limited due to motion artifact. Musculoskeletal: Diffuse skeletal metastasis with mixed sclerotic and lucent lesions throughout the visualized skeletal system. Review of the MIP images confirms the above findings. CT ABDOMEN and PELVIS FINDINGS Hepatobiliary: Multiple low-attenuation peripherally enhancing lesions demonstrated throughout the liver consistent with diffuse hepatic metastasis. Portal veins are patent. Gallbladder and bile ducts are unremarkable. Pancreas: Unremarkable. No pancreatic ductal dilatation or surrounding inflammatory changes. Spleen: Normal in size without focal abnormality. Adrenals/Urinary Tract: Adrenal glands are unremarkable. Kidneys are normal, without renal calculi, focal lesion, or hydronephrosis. Bladder is unremarkable. Stomach/Bowel: Stomach is within normal limits. Appendix appears normal. No evidence of bowel wall thickening, distention, or inflammatory changes. Vascular/Lymphatic: Aortic atherosclerosis. No enlarged abdominal or pelvic lymph nodes. Reproductive: Uterus and bilateral adnexa are unremarkable. Other: Small amount of free fluid in the abdomen and pelvis, possibly reactive or may indicate peritoneal metastasis. Prominent upper abdominal varices. Edema in the subcutaneous fat throughout the abdominal wall. No free air. Musculoskeletal: Diffuse skeletal metastasis with mixed sclerotic and lucent lesions for about the visualized skeletal elements. Review of the MIP images confirms the above findings. IMPRESSION: 1. No evidence of significant pulmonary embolus. 2. Moderate bilateral pleural effusions with basilar atelectasis. Patchy airspace infiltrates in the remaining lungs may represent edema or pneumonia.  3. Noncalcified nodules in the right middle lung and right lower lung, likely metastatic. 4. Diffuse hepatic metastasis. 5. Diffuse skeletal metastasis. 6. Prominent upper abdominal varices. 7. Small amount of free fluid in the abdomen and pelvis, may be reactive or may indicate peritoneal metastasis. 8. Edema in the subcutaneous fat throughout the abdominal wall. Aortic Atherosclerosis (  ICD10-I70.0). Electronically Signed   By: Lucienne Capers M.D.   On: 02/27/2020 19:40   DG Chest Portable 1 View  Result Date: 02/27/2020 CLINICAL DATA:  Shortness of breath EXAM: PORTABLE CHEST 1 VIEW COMPARISON:  None. FINDINGS: There is hyperinflation of the lungs compatible with COPD. Small left pleural effusion with left base atelectasis. Right lung clear. No acute bony abnormality. Multiple old healed right rib fractures. IMPRESSION: Small left pleural effusion with left base atelectasis. COPD. Electronically Signed   By: Rolm Baptise M.D.   On: 02/27/2020 17:56   UE VENOUS DUPLEX ([facility] & [facility] 7 am - 7 pm)  Result Date: 02/27/2020 UPPER VENOUS STUDY  Indications: Swelling of LT forearm/wrist/hand. Limitations: Depth and size of vessels. Comparison Study: No prior studies available. Performing Technologist: Darlin Coco RDMS  Examination Guidelines: A complete evaluation includes B-mode imaging, spectral Doppler, color Doppler, and power Doppler as needed of all accessible portions of each vessel. Bilateral testing is considered an integral part of a complete examination. Limited examinations for reoccurring indications may be performed as noted.  Right Findings: +----------+------------+---------+-----------+----------+-------+ RIGHT     CompressiblePhasicitySpontaneousPropertiesSummary +----------+------------+---------+-----------+----------+-------+ Subclavian               Yes       Yes                      +----------+------------+---------+-----------+----------+-------+  Left Findings:  +----------+------------+---------+-----------+----------+-------+ LEFT      CompressiblePhasicitySpontaneousPropertiesSummary +----------+------------+---------+-----------+----------+-------+ IJV           Full       Yes       Yes                      +----------+------------+---------+-----------+----------+-------+ Subclavian    Full       Yes       Yes                      +----------+------------+---------+-----------+----------+-------+ Axillary      Full       Yes       Yes                      +----------+------------+---------+-----------+----------+-------+ Brachial      Full       Yes       Yes                      +----------+------------+---------+-----------+----------+-------+ Radial        Full                                          +----------+------------+---------+-----------+----------+-------+ Ulnar         Full                                          +----------+------------+---------+-----------+----------+-------+ Cephalic      Full                                          +----------+------------+---------+-----------+----------+-------+ Basilic       Full                                          +----------+------------+---------+-----------+----------+-------+  Summary:  Right: No evidence of thrombosis in the subclavian.  Left: No evidence of deep vein thrombosis in the upper extremity. No evidence of superficial vein thrombosis in the upper extremity.  Indicental: Appearance of enlarged, hypoechoic lymph node with loss of hilar definition in the axilla.  *See table(s) above for measurements and observations.  Diagnosing physician: Harold Barban MD Electronically signed by Harold Barban MD on 02/27/2020 at 9:42:15 PM.    Final     Labs:  CBC: Recent Labs    02/27/20 1537 02/28/20 0443  WBC 9.4 9.2  HGB 13.5 12.6  HCT 40.9 38.0  PLT 394 349    COAGS: No results for input(s): INR, APTT in the last 8760  hours.  BMP: Recent Labs    02/27/20 1537 02/28/20 0443  NA 134* 134*  K 4.1 3.7  CL 97* 97*  CO2 21* 21*  GLUCOSE 110* 70  BUN 31* 28*  CALCIUM 11.1* 10.7*  CREATININE 0.95 0.89  GFRNONAA >60 >60    LIVER FUNCTION TESTS: Recent Labs    02/27/20 1721 02/28/20 0443  BILITOT 0.6 1.2  AST 154* 143*  ALT 30 27  ALKPHOS 652* 590*  PROT 6.4* 5.7*  ALBUMIN 2.6* 2.2*    TUMOR MARKERS: No results for input(s): AFPTM, CEA, CA199, CHROMGRNA in the last 8760 hours.  Assessment and Plan: 80 y.o. female  with no significant past medical history but has not seen a physician in many years.  She presented to the [facility] ED 1/26 with weakness.  She has a left breast mass and developed left arm swelling.  The patient thinks that the breast mass and swelling have been present for at least a few weeks to months.  She states that she initially developed pruritus and later a lump involving the left breast.  Left arm swelling has been more recent.  Last mammogram was many years ago.  CTA chest and CT of the abdomen pelvis were performed which showed no evidence of PE but did show moderate bilateral pleural effusions, nodules in the right middle lung and right lower lung likely metastatic, diffuse hepatic metastasis, diffuse skeletal metastasis.LUE venous doppler study yesterday was neg for DVT but showed enlarged axillary node. Request now received from oncology for image guided liver lesion biopsy. Imaging studies were reviewed by Dr. Kathlene Cote. WBC nl, hgb 12.6, plts 349k, creat nl, COVID 19 neg, PT/INR pend.  Risks and benefits of procedure was discussed with the patient  family including, but not limited to bleeding, infection, damage to adjacent structures or low yield requiring additional tests.  All of the questions were answered and there is agreement to proceed.  Consent signed and in chart.  Procedure planned for 1/28  Thank you for this interesting consult.  I greatly enjoyed meeting HILDAGARD SOBECKI and look forward to participating in their care.  A copy of this report was sent to the requesting provider on this date.  Electronically Signed: D. Rowe Robert, PA-C 02/28/2020, 3:15 PM   I spent a total of 30 minutes   in face to face in clinical consultation, greater than 50% of which was counseling/coordinating care for image guided liver lesion biopsy

## 2020-02-28 NOTE — Evaluation (Signed)
Physical Therapy Evaluation Patient Details Name: Morgan Yu MRN: [id number] DOB: Dec 24, 1940 Today's Date: 02/28/2020   History of Present Illness  Pt is a 80 y/o female admitted secondary to worsening LUE swelling. Found to have L breast mass with concern for metastasis. No pertinent PMH in the chart.  Clinical Impression  Pt admitted secondary to problem above with deficits below. Requiring min guard A for mobility within ED room. Pt reporting RLE pain at baseline secondary to sciatica. Feel pt would benefit from HHPT at d/c. Reports she has good family support that can assist when needed. Will continue to follow acutely.     Follow Up Recommendations Home health PT    Equipment Recommendations  Rolling walker with 5" wheels    Recommendations for Other Services OT consult     Precautions / Restrictions Precautions Precautions: Fall Restrictions Weight Bearing Restrictions: No      Mobility  Bed Mobility Overal bed mobility: Needs Assistance Bed Mobility: Supine to Sit;Sit to Supine     Supine to sit: Min assist Sit to supine: Supervision   General bed mobility comments: Required assist for trunk elevation to come to sitting.    Transfers Overall transfer level: Needs assistance Equipment used: 1 person hand held assist Transfers: Sit to/from Stand Sit to Stand: Min guard         General transfer comment: Min guard A for steadying  Ambulation/Gait Ambulation/Gait assistance: Min guard Gait Distance (Feet): 10 Feet Assistive device: 1 person hand held assist Gait Pattern/deviations: Step-through pattern;Decreased stride length;Narrow base of support Gait velocity: Decreased   General Gait Details: Narrow BOS with mild unsteadiness. No overt LOB noted. Using HHA for support  Stairs            Wheelchair Mobility    Modified Rankin (Stroke Patients Only)       Balance Overall balance assessment: Needs assistance Sitting-balance support: No  upper extremity supported;Feet supported Sitting balance-Leahy Scale: Good     Standing balance support: Single extremity supported;During functional activity Standing balance-Leahy Scale: Fair Standing balance comment: Able to maintain static standing without UE support                             Pertinent Vitals/Pain Pain Assessment: No/denies pain    Home Living Family/patient expects to be discharged to:: Private residence Living Arrangements: Alone Available Help at Discharge: Family;Available 24 hours/day Type of Home: House Home Access: Stairs to enter Entrance Stairs-Rails: None Entrance Stairs-Number of Steps: 3 Home Layout: One level Home Equipment: None      Prior Function Level of Independence: Independent               Hand Dominance   Dominant Hand: Right    Extremity/Trunk Assessment   Upper Extremity Assessment Upper Extremity Assessment: Defer to OT evaluation;LUE deficits/detail LUE Deficits / Details: LUE swelling noted throughout arm.    Lower Extremity Assessment Lower Extremity Assessment: Generalized weakness;RLE deficits/detail RLE Deficits / Details: Reports sciatic pain at baseline in RLE    Cervical / Trunk Assessment Cervical / Trunk Assessment: Kyphotic  Communication   Communication: No difficulties  Cognition Arousal/Alertness: Awake/alert Behavior During Therapy: WFL for tasks assessed/performed Overall Cognitive Status: Within Functional Limits for tasks assessed  General Comments      Exercises     Assessment/Plan    PT Assessment Patient needs continued PT services  PT Problem List Decreased strength;Decreased balance;Decreased mobility;Decreased activity tolerance       PT Treatment Interventions DME instruction;Gait training;Functional mobility training;Therapeutic exercise;Therapeutic activities;Stair training;Patient/family education;Balance  training    PT Goals (Current goals can be found in the Care Plan section)  Acute Rehab PT Goals Patient Stated Goal: to go home PT Goal Formulation: With patient Time For Goal Achievement: 03/13/20 Potential to Achieve Goals: Good    Frequency Min 3X/week   Barriers to discharge        Co-evaluation               AM-PAC PT "6 Clicks" Mobility  Outcome Measure Help needed turning from your back to your side while in a flat bed without using bedrails?: A Little Help needed moving from lying on your back to sitting on the side of a flat bed without using bedrails?: A Little Help needed moving to and from a bed to a chair (including a wheelchair)?: A Little Help needed standing up from a chair using your arms (e.g., wheelchair or bedside chair)?: A Little Help needed to walk in hospital room?: A Little Help needed climbing 3-5 steps with a railing? : A Little 6 Click Score: 18    End of Session Equipment Utilized During Treatment: Gait belt Activity Tolerance: Patient tolerated treatment well Patient left: in bed;with call bell/phone within reach;with family/visitor present (on stretcher in ED) Nurse Communication: Mobility status PT Visit Diagnosis: Unsteadiness on feet (R26.81);Muscle weakness (generalized) (M62.81)    Time: 4854-6270 PT Time Calculation (min) (ACUTE ONLY): 23 min   Charges:   PT Evaluation $PT Eval Low Complexity: 1 Low PT Treatments $Therapeutic Activity: 8-22 mins        Lou Miner, DPT  Acute Rehabilitation Services  Pager: [phone] Office: [phone]   Rudean Hitt 02/28/2020, 1:51 PM

## 2020-02-28 NOTE — Consult Note (Addendum)
[place]  [phone] [phone]   MEDICAL ONCOLOGY - INITIAL CONSULTATION  Referral MD: Dr. Antonieta Pert  Reason for Referral: Abnormal CT scans concerning for metastatic cancer  HPI: [name] is a 80 year old female with no significant past medical history but has not seen a physician in many years.  She presented to the emergency department with weakness.  She has a left breast mass and developed left arm swelling.  The patient thinks that the breast mass and swelling have been present for at least a few weeks to months.  She states that she initially developed pruritus and later a lump involving the left breast.  Left arm swelling has been more recent.  Last mammogram was many years ago.  CTA chest and CT of the abdomen pelvis were performed which showed no evidence of PE but did show moderate bilateral pleural effusions, nodules in the right middle lung and right lower lung likely metastatic, diffuse hepatic metastasis, diffuse skeletal metastasis.  CBC normal.  Chemistry performed earlier today showed an elevated BUN of 28, normal creatinine 0.89, elevated calcium of 10.7 with a low albumin of 2.2 (corrected calcium of 11.7), AST 143, alk phos 590, normal T bili.  The patient was seen in the emergency department today.  Her cousin, Morgan Yu, was at the bedside.  The patient tells [place] that she has had weight loss recently but unsure how much weight she has lost.  She is not certain how long the breast mass has been present and how long she has had left arm swelling but estimates this to be at least weeks to months.  She reports generalized weakness.  She is not having any headaches or dizziness.  No seizure activity.  Denies chest pain, shortness of breath, cough, abdominal pain, nausea, vomiting. No bony pain reported.  She fell a few nights ago.  Thinks that she lost her balance.  She reports being independent with her ADLs but over the past 2 weeks has stopped  driving.  Family member at the bedside indicates that she may have some mild confusion.  The patient is widowed and lives alone.  She has no biological children but has 2 stepchildren.  She has friends and family who check on her.  Denies history of alcohol tobacco use.  Family history significant for a maternal aunt with esophageal cancer (smoker) and a paternal aunt with breast cancer in her 76s.  The patient reports menarche around age 74 and menopause at around age 77.  Used hormone replacement therapy for about 1 year around age 77.  Medical oncology was asked see the patient make recommendations regarding her abnormal CT scan results.   History reviewed. No pertinent past medical history.:  History reviewed. No pertinent surgical history.:  Current Facility-Administered Medications  Medication Dose Route Frequency Provider Last Rate Last Admin  . 0.9 %  sodium chloride infusion  250 mL Intravenous PRN Opyd, Ilene Qua, MD      . acetaminophen (TYLENOL) tablet 650 mg  650 mg Oral Q6H PRN Opyd, Ilene Qua, MD       Or  . acetaminophen (TYLENOL) suppository 650 mg  650 mg Rectal Q6H PRN Opyd, Ilene Qua, MD      . Derrill Memo ON 02/29/2020] furosemide (LASIX) injection 20 mg  20 mg Intravenous Daily Kc, Ramesh, MD      . LORazepam (ATIVAN) injection 0.5 mg  0.5 mg Intravenous Once Maryanna Shape, NP      . ondansetron (  ZOFRAN) tablet 4 mg  4 mg Oral Q6H PRN Opyd, Ilene Qua, MD       Or  . ondansetron (ZOFRAN) injection 4 mg  4 mg Intravenous Q6H PRN Opyd, Ilene Qua, MD      . polyethylene glycol (MIRALAX / GLYCOLAX) packet 17 g  17 g Oral Daily PRN Opyd, Ilene Qua, MD      . sodium chloride flush (NS) 0.9 % injection 3 mL  3 mL Intravenous Q12H Opyd, Ilene Qua, MD   3 mL at 02/27/20 2109  . traMADol (ULTRAM) tablet 50 mg  50 mg Oral Q6H PRN Antonieta Pert, MD       Current Outpatient Medications  Medication Sig Dispense Refill  . acetaminophen (TYLENOL) 500 MG tablet Take 500 mg by mouth every 6  (six) hours as needed for moderate pain.    . Probiotic Product (PROBIOTIC PO) Take 1 capsule by mouth daily.       No Known Allergies:  Family History  Problem Relation Age of Onset  . Stroke Mother   . Heart attack Father   . Cancer Brother   . Esophageal cancer Maternal Aunt   . Breast cancer Maternal Aunt   :  Social History   Socioeconomic History  . Marital status: Widowed    Spouse name: Not on file  . Number of children: Not on file  . Years of education: Not on file  . Highest education level: Not on file  Occupational History  . Not on file  Tobacco Use  . Smoking status: Never Smoker  . Smokeless tobacco: Never Used  Substance and Sexual Activity  . Alcohol use: Not Currently  . Drug use: Never  . Sexual activity: Not on file  Other Topics Concern  . Not on file  Social History Narrative   No biological children   Has 2 stepchildren   Social Determinants of Health   Financial Resource Strain: Not on file  Food Insecurity: Not on file  Transportation Needs: Not on file  Physical Activity: Not on file  Stress: Not on file  Social Connections: Not on file  Intimate Partner Violence: Not on file  :  Review of Systems: A comprehensive 14 point review of systems was negative except as noted in the HPI.  Exam: Patient Vitals for the past 24 hrs:  BP Temp Temp src Pulse Resp SpO2  02/28/20 1000 124/78 -- -- 100 19 92 %  02/28/20 0930 134/66 -- -- 96 19 93 %  02/28/20 0700 134/68 -- -- 96 20 93 %  02/28/20 0630 135/68 -- -- 95 19 92 %  02/28/20 0600 126/64 -- -- 87 17 91 %  02/28/20 0530 134/67 -- -- 91 18 91 %  02/28/20 0500 134/70 -- -- 93 20 93 %  02/28/20 0400 139/63 -- -- 88 18 93 %  02/28/20 0230 139/64 -- -- 92 19 92 %  02/27/20 2300 (!) 141/71 -- -- 97 17 95 %  02/27/20 2100 (!) 153/85 -- -- (!) 109 (!) 23 93 %  02/27/20 1915 (!) 166/78 -- -- (!) 114 (!) 23 96 %  02/27/20 1845 (!) 144/69 -- -- (!) 103 19 93 %  02/27/20 1830 (!) 147/66 --  -- (!) 105 (!) 23 93 %  02/27/20 1815 (!) 142/74 -- -- (!) 107 (!) 22 95 %  02/27/20 1800 (!) 157/82 -- -- (!) 109 18 93 %  02/27/20 1745 (!) 147/78 -- -- (!) 104 19 96 %  02/27/20 1730 140/75 -- -- (!) 105 20 92 %  02/27/20 1521 (!) 160/80 98.5 F (36.9 C) Oral (!) 118 15 96 %    General: Elderly female, no distress    Eyes:  no scleral icterus.   ENT:  There were no oropharyngeal lesions.   Lymphatics: Multiple palpable cervical lymph nodes, palpable left axillary lymph nodes BREAST: Left breast with erythema, skin involvement noted, palpable mass in the left breast occupying pretty much the entire breast,  Respiratory: lungs were clear bilaterally without wheezing or crackles.   Cardiovascular:  Regular rate and rhythm, S1/S2, without murmur, rub or gallop.  There was no pedal edema.   GI:  abdomen was soft, flat, nontender, nondistended, without organomegaly.   Musculoskeletal: Strength symmetrical in the upper and lower extremities. Skin exam was without echymosis, petichae.   Neuro exam was nonfocal. Patient was alert and oriented.  Attention was good.   Language was appropriate.  Mood was normal without depression.  Speech was not pressured.  Thought content was not tangential.     Lab Results  Component Value Date   WBC 9.2 02/28/2020   HGB 12.6 02/28/2020   HCT 38.0 02/28/2020   PLT 349 02/28/2020   GLUCOSE 70 02/28/2020   ALT 27 02/28/2020   AST 143 (H) 02/28/2020   NA 134 (L) 02/28/2020   K 3.7 02/28/2020   CL 97 (L) 02/28/2020   CREATININE 0.89 02/28/2020   BUN 28 (H) 02/28/2020   CO2 21 (L) 02/28/2020    CT Angio Chest PE W and/or Wo Contrast  Result Date: 02/27/2020 CLINICAL DATA:  Acute abdominal pain. Elevated liver function test. Suspected breast cancer. Query metastasis. Query shortness of breath. Pulmonary embolus suspected with high probability. EXAM: CT ANGIOGRAPHY CHEST CT ABDOMEN AND PELVIS WITH CONTRAST TECHNIQUE: Multidetector CT imaging of the chest  was performed using the standard protocol during bolus administration of intravenous contrast. Multiplanar CT image reconstructions and MIPs were obtained to evaluate the vascular anatomy. Multidetector CT imaging of the abdomen and pelvis was performed using the standard protocol during bolus administration of intravenous contrast. CONTRAST:  129m OMNIPAQUE IOHEXOL 350 MG/ML SOLN COMPARISON:  Chest radiograph 02/27/2020 FINDINGS: CTA CHEST FINDINGS Cardiovascular: Satisfactory opacification of the pulmonary arteries to the segmental level. No evidence of pulmonary embolism. Normal heart size. No pericardial effusion. Normal caliber thoracic aorta. No aortic dissection. Mediastinum/Nodes: Esophagus is decompressed. No significant lymphadenopathy in the mediastinum. Multiple enlarged lymph nodes in the left axilla, measuring up to 2.5 x 3.3 cm in diameter, likely metastatic. Lungs/Pleura: Moderate bilateral pleural effusions. Atelectasis in the lung bases with patchy airspace infiltrates in the remaining lungs. This could represent edema or pneumonia. Noncalcified nodule in the right middle lung measuring 9 mm in diameter, likely metastatic. Additional smaller nodules demonstrated in the right lower lung. Evaluation is limited due to motion artifact. Musculoskeletal: Diffuse skeletal metastasis with mixed sclerotic and lucent lesions throughout the visualized skeletal system. Review of the MIP images confirms the above findings. CT ABDOMEN and PELVIS FINDINGS Hepatobiliary: Multiple low-attenuation peripherally enhancing lesions demonstrated throughout the liver consistent with diffuse hepatic metastasis. Portal veins are patent. Gallbladder and bile ducts are unremarkable. Pancreas: Unremarkable. No pancreatic ductal dilatation or surrounding inflammatory changes. Spleen: Normal in size without focal abnormality. Adrenals/Urinary Tract: Adrenal glands are unremarkable. Kidneys are normal, without renal calculi,  focal lesion, or hydronephrosis. Bladder is unremarkable. Stomach/Bowel: Stomach is within normal limits. Appendix appears normal. No evidence of bowel wall thickening, distention, or inflammatory  changes. Vascular/Lymphatic: Aortic atherosclerosis. No enlarged abdominal or pelvic lymph nodes. Reproductive: Uterus and bilateral adnexa are unremarkable. Other: Small amount of free fluid in the abdomen and pelvis, possibly reactive or may indicate peritoneal metastasis. Prominent upper abdominal varices. Edema in the subcutaneous fat throughout the abdominal wall. No free air. Musculoskeletal: Diffuse skeletal metastasis with mixed sclerotic and lucent lesions for about the visualized skeletal elements. Review of the MIP images confirms the above findings. IMPRESSION: 1. No evidence of significant pulmonary embolus. 2. Moderate bilateral pleural effusions with basilar atelectasis. Patchy airspace infiltrates in the remaining lungs may represent edema or pneumonia. 3. Noncalcified nodules in the right middle lung and right lower lung, likely metastatic. 4. Diffuse hepatic metastasis. 5. Diffuse skeletal metastasis. 6. Prominent upper abdominal varices. 7. Small amount of free fluid in the abdomen and pelvis, may be reactive or may indicate peritoneal metastasis. 8. Edema in the subcutaneous fat throughout the abdominal wall. Aortic Atherosclerosis (ICD10-I70.0). Electronically Signed   By: Lucienne Capers M.D.   On: 02/27/2020 19:40   CT ABDOMEN PELVIS W CONTRAST  Result Date: 02/27/2020 CLINICAL DATA:  Acute abdominal pain. Elevated liver function test. Suspected breast cancer. Query metastasis. Query shortness of breath. Pulmonary embolus suspected with high probability. EXAM: CT ANGIOGRAPHY CHEST CT ABDOMEN AND PELVIS WITH CONTRAST TECHNIQUE: Multidetector CT imaging of the chest was performed using the standard protocol during bolus administration of intravenous contrast. Multiplanar CT image reconstructions  and MIPs were obtained to evaluate the vascular anatomy. Multidetector CT imaging of the abdomen and pelvis was performed using the standard protocol during bolus administration of intravenous contrast. CONTRAST:  112m OMNIPAQUE IOHEXOL 350 MG/ML SOLN COMPARISON:  Chest radiograph 02/27/2020 FINDINGS: CTA CHEST FINDINGS Cardiovascular: Satisfactory opacification of the pulmonary arteries to the segmental level. No evidence of pulmonary embolism. Normal heart size. No pericardial effusion. Normal caliber thoracic aorta. No aortic dissection. Mediastinum/Nodes: Esophagus is decompressed. No significant lymphadenopathy in the mediastinum. Multiple enlarged lymph nodes in the left axilla, measuring up to 2.5 x 3.3 cm in diameter, likely metastatic. Lungs/Pleura: Moderate bilateral pleural effusions. Atelectasis in the lung bases with patchy airspace infiltrates in the remaining lungs. This could represent edema or pneumonia. Noncalcified nodule in the right middle lung measuring 9 mm in diameter, likely metastatic. Additional smaller nodules demonstrated in the right lower lung. Evaluation is limited due to motion artifact. Musculoskeletal: Diffuse skeletal metastasis with mixed sclerotic and lucent lesions throughout the visualized skeletal system. Review of the MIP images confirms the above findings. CT ABDOMEN and PELVIS FINDINGS Hepatobiliary: Multiple low-attenuation peripherally enhancing lesions demonstrated throughout the liver consistent with diffuse hepatic metastasis. Portal veins are patent. Gallbladder and bile ducts are unremarkable. Pancreas: Unremarkable. No pancreatic ductal dilatation or surrounding inflammatory changes. Spleen: Normal in size without focal abnormality. Adrenals/Urinary Tract: Adrenal glands are unremarkable. Kidneys are normal, without renal calculi, focal lesion, or hydronephrosis. Bladder is unremarkable. Stomach/Bowel: Stomach is within normal limits. Appendix appears normal. No  evidence of bowel wall thickening, distention, or inflammatory changes. Vascular/Lymphatic: Aortic atherosclerosis. No enlarged abdominal or pelvic lymph nodes. Reproductive: Uterus and bilateral adnexa are unremarkable. Other: Small amount of free fluid in the abdomen and pelvis, possibly reactive or may indicate peritoneal metastasis. Prominent upper abdominal varices. Edema in the subcutaneous fat throughout the abdominal wall. No free air. Musculoskeletal: Diffuse skeletal metastasis with mixed sclerotic and lucent lesions for about the visualized skeletal elements. Review of the MIP images confirms the above findings. IMPRESSION: 1. No evidence of significant pulmonary embolus.  2. Moderate bilateral pleural effusions with basilar atelectasis. Patchy airspace infiltrates in the remaining lungs may represent edema or pneumonia. 3. Noncalcified nodules in the right middle lung and right lower lung, likely metastatic. 4. Diffuse hepatic metastasis. 5. Diffuse skeletal metastasis. 6. Prominent upper abdominal varices. 7. Small amount of free fluid in the abdomen and pelvis, may be reactive or may indicate peritoneal metastasis. 8. Edema in the subcutaneous fat throughout the abdominal wall. Aortic Atherosclerosis (ICD10-I70.0). Electronically Signed   By: Lucienne Capers M.D.   On: 02/27/2020 19:40   DG Chest Portable 1 View  Result Date: 02/27/2020 CLINICAL DATA:  Shortness of breath EXAM: PORTABLE CHEST 1 VIEW COMPARISON:  None. FINDINGS: There is hyperinflation of the lungs compatible with COPD. Small left pleural effusion with left base atelectasis. Right lung clear. No acute bony abnormality. Multiple old healed right rib fractures. IMPRESSION: Small left pleural effusion with left base atelectasis. COPD. Electronically Signed   By: Rolm Baptise M.D.   On: 02/27/2020 17:56   UE VENOUS DUPLEX ([facility] & [facility] 7 am - 7 pm)  Result Date: 02/27/2020 UPPER VENOUS STUDY  Indications: Swelling of LT  forearm/wrist/hand. Limitations: Depth and size of vessels. Comparison Study: No prior studies available. Performing Technologist: Darlin Coco RDMS  Examination Guidelines: A complete evaluation includes B-mode imaging, spectral Doppler, color Doppler, and power Doppler as needed of all accessible portions of each vessel. Bilateral testing is considered an integral part of a complete examination. Limited examinations for reoccurring indications may be performed as noted.  Right Findings: +----------+------------+---------+-----------+----------+-------+ RIGHT     CompressiblePhasicitySpontaneousPropertiesSummary +----------+------------+---------+-----------+----------+-------+ Subclavian               Yes       Yes                      +----------+------------+---------+-----------+----------+-------+  Left Findings: +----------+------------+---------+-----------+----------+-------+ LEFT      CompressiblePhasicitySpontaneousPropertiesSummary +----------+------------+---------+-----------+----------+-------+ IJV           Full       Yes       Yes                      +----------+------------+---------+-----------+----------+-------+ Subclavian    Full       Yes       Yes                      +----------+------------+---------+-----------+----------+-------+ Axillary      Full       Yes       Yes                      +----------+------------+---------+-----------+----------+-------+ Brachial      Full       Yes       Yes                      +----------+------------+---------+-----------+----------+-------+ Radial        Full                                          +----------+------------+---------+-----------+----------+-------+ Ulnar         Full                                          +----------+------------+---------+-----------+----------+-------+  Cephalic      Full                                           +----------+------------+---------+-----------+----------+-------+ Basilic       Full                                          +----------+------------+---------+-----------+----------+-------+  Summary:  Right: No evidence of thrombosis in the subclavian.  Left: No evidence of deep vein thrombosis in the upper extremity. No evidence of superficial vein thrombosis in the upper extremity.  Indicental: Appearance of enlarged, hypoechoic lymph node with loss of hilar definition in the axilla.  *See table(s) above for measurements and observations.  Diagnosing physician: Harold Barban MD Electronically signed by Harold Barban MD on 02/27/2020 at 9:42:15 PM.    Final      CT Angio Chest PE W and/or Wo Contrast  Result Date: 02/27/2020 CLINICAL DATA:  Acute abdominal pain. Elevated liver function test. Suspected breast cancer. Query metastasis. Query shortness of breath. Pulmonary embolus suspected with high probability. EXAM: CT ANGIOGRAPHY CHEST CT ABDOMEN AND PELVIS WITH CONTRAST TECHNIQUE: Multidetector CT imaging of the chest was performed using the standard protocol during bolus administration of intravenous contrast. Multiplanar CT image reconstructions and MIPs were obtained to evaluate the vascular anatomy. Multidetector CT imaging of the abdomen and pelvis was performed using the standard protocol during bolus administration of intravenous contrast. CONTRAST:  143m OMNIPAQUE IOHEXOL 350 MG/ML SOLN COMPARISON:  Chest radiograph 02/27/2020 FINDINGS: CTA CHEST FINDINGS Cardiovascular: Satisfactory opacification of the pulmonary arteries to the segmental level. No evidence of pulmonary embolism. Normal heart size. No pericardial effusion. Normal caliber thoracic aorta. No aortic dissection. Mediastinum/Nodes: Esophagus is decompressed. No significant lymphadenopathy in the mediastinum. Multiple enlarged lymph nodes in the left axilla, measuring up to 2.5 x 3.3 cm in diameter, likely metastatic.  Lungs/Pleura: Moderate bilateral pleural effusions. Atelectasis in the lung bases with patchy airspace infiltrates in the remaining lungs. This could represent edema or pneumonia. Noncalcified nodule in the right middle lung measuring 9 mm in diameter, likely metastatic. Additional smaller nodules demonstrated in the right lower lung. Evaluation is limited due to motion artifact. Musculoskeletal: Diffuse skeletal metastasis with mixed sclerotic and lucent lesions throughout the visualized skeletal system. Review of the MIP images confirms the above findings. CT ABDOMEN and PELVIS FINDINGS Hepatobiliary: Multiple low-attenuation peripherally enhancing lesions demonstrated throughout the liver consistent with diffuse hepatic metastasis. Portal veins are patent. Gallbladder and bile ducts are unremarkable. Pancreas: Unremarkable. No pancreatic ductal dilatation or surrounding inflammatory changes. Spleen: Normal in size without focal abnormality. Adrenals/Urinary Tract: Adrenal glands are unremarkable. Kidneys are normal, without renal calculi, focal lesion, or hydronephrosis. Bladder is unremarkable. Stomach/Bowel: Stomach is within normal limits. Appendix appears normal. No evidence of bowel wall thickening, distention, or inflammatory changes. Vascular/Lymphatic: Aortic atherosclerosis. No enlarged abdominal or pelvic lymph nodes. Reproductive: Uterus and bilateral adnexa are unremarkable. Other: Small amount of free fluid in the abdomen and pelvis, possibly reactive or may indicate peritoneal metastasis. Prominent upper abdominal varices. Edema in the subcutaneous fat throughout the abdominal wall. No free air. Musculoskeletal: Diffuse skeletal metastasis with mixed sclerotic and lucent lesions for about the visualized skeletal elements. Review of the MIP images confirms the above findings. IMPRESSION: 1. No  evidence of significant pulmonary embolus. 2. Moderate bilateral pleural effusions with basilar atelectasis.  Patchy airspace infiltrates in the remaining lungs may represent edema or pneumonia. 3. Noncalcified nodules in the right middle lung and right lower lung, likely metastatic. 4. Diffuse hepatic metastasis. 5. Diffuse skeletal metastasis. 6. Prominent upper abdominal varices. 7. Small amount of free fluid in the abdomen and pelvis, may be reactive or may indicate peritoneal metastasis. 8. Edema in the subcutaneous fat throughout the abdominal wall. Aortic Atherosclerosis (ICD10-I70.0). Electronically Signed   By: Lucienne Capers M.D.   On: 02/27/2020 19:40   CT ABDOMEN PELVIS W CONTRAST  Result Date: 02/27/2020 CLINICAL DATA:  Acute abdominal pain. Elevated liver function test. Suspected breast cancer. Query metastasis. Query shortness of breath. Pulmonary embolus suspected with high probability. EXAM: CT ANGIOGRAPHY CHEST CT ABDOMEN AND PELVIS WITH CONTRAST TECHNIQUE: Multidetector CT imaging of the chest was performed using the standard protocol during bolus administration of intravenous contrast. Multiplanar CT image reconstructions and MIPs were obtained to evaluate the vascular anatomy. Multidetector CT imaging of the abdomen and pelvis was performed using the standard protocol during bolus administration of intravenous contrast. CONTRAST:  169m OMNIPAQUE IOHEXOL 350 MG/ML SOLN COMPARISON:  Chest radiograph 02/27/2020 FINDINGS: CTA CHEST FINDINGS Cardiovascular: Satisfactory opacification of the pulmonary arteries to the segmental level. No evidence of pulmonary embolism. Normal heart size. No pericardial effusion. Normal caliber thoracic aorta. No aortic dissection. Mediastinum/Nodes: Esophagus is decompressed. No significant lymphadenopathy in the mediastinum. Multiple enlarged lymph nodes in the left axilla, measuring up to 2.5 x 3.3 cm in diameter, likely metastatic. Lungs/Pleura: Moderate bilateral pleural effusions. Atelectasis in the lung bases with patchy airspace infiltrates in the remaining  lungs. This could represent edema or pneumonia. Noncalcified nodule in the right middle lung measuring 9 mm in diameter, likely metastatic. Additional smaller nodules demonstrated in the right lower lung. Evaluation is limited due to motion artifact. Musculoskeletal: Diffuse skeletal metastasis with mixed sclerotic and lucent lesions throughout the visualized skeletal system. Review of the MIP images confirms the above findings. CT ABDOMEN and PELVIS FINDINGS Hepatobiliary: Multiple low-attenuation peripherally enhancing lesions demonstrated throughout the liver consistent with diffuse hepatic metastasis. Portal veins are patent. Gallbladder and bile ducts are unremarkable. Pancreas: Unremarkable. No pancreatic ductal dilatation or surrounding inflammatory changes. Spleen: Normal in size without focal abnormality. Adrenals/Urinary Tract: Adrenal glands are unremarkable. Kidneys are normal, without renal calculi, focal lesion, or hydronephrosis. Bladder is unremarkable. Stomach/Bowel: Stomach is within normal limits. Appendix appears normal. No evidence of bowel wall thickening, distention, or inflammatory changes. Vascular/Lymphatic: Aortic atherosclerosis. No enlarged abdominal or pelvic lymph nodes. Reproductive: Uterus and bilateral adnexa are unremarkable. Other: Small amount of free fluid in the abdomen and pelvis, possibly reactive or may indicate peritoneal metastasis. Prominent upper abdominal varices. Edema in the subcutaneous fat throughout the abdominal wall. No free air. Musculoskeletal: Diffuse skeletal metastasis with mixed sclerotic and lucent lesions for about the visualized skeletal elements. Review of the MIP images confirms the above findings. IMPRESSION: 1. No evidence of significant pulmonary embolus. 2. Moderate bilateral pleural effusions with basilar atelectasis. Patchy airspace infiltrates in the remaining lungs may represent edema or pneumonia. 3. Noncalcified nodules in the right middle  lung and right lower lung, likely metastatic. 4. Diffuse hepatic metastasis. 5. Diffuse skeletal metastasis. 6. Prominent upper abdominal varices. 7. Small amount of free fluid in the abdomen and pelvis, may be reactive or may indicate peritoneal metastasis. 8. Edema in the subcutaneous fat throughout the abdominal wall. Aortic Atherosclerosis (ICD10-I70.0).  Electronically Signed   By: Lucienne Capers M.D.   On: 02/27/2020 19:40   DG Chest Portable 1 View  Result Date: 02/27/2020 CLINICAL DATA:  Shortness of breath EXAM: PORTABLE CHEST 1 VIEW COMPARISON:  None. FINDINGS: There is hyperinflation of the lungs compatible with COPD. Small left pleural effusion with left base atelectasis. Right lung clear. No acute bony abnormality. Multiple old healed right rib fractures. IMPRESSION: Small left pleural effusion with left base atelectasis. COPD. Electronically Signed   By: Rolm Baptise M.D.   On: 02/27/2020 17:56   UE VENOUS DUPLEX ([facility] & [facility] 7 am - 7 pm)  Result Date: 02/27/2020 UPPER VENOUS STUDY  Indications: Swelling of LT forearm/wrist/hand. Limitations: Depth and size of vessels. Comparison Study: No prior studies available. Performing Technologist: Darlin Coco RDMS  Examination Guidelines: A complete evaluation includes B-mode imaging, spectral Doppler, color Doppler, and power Doppler as needed of all accessible portions of each vessel. Bilateral testing is considered an integral part of a complete examination. Limited examinations for reoccurring indications may be performed as noted.  Right Findings: +----------+------------+---------+-----------+----------+-------+ RIGHT     CompressiblePhasicitySpontaneousPropertiesSummary +----------+------------+---------+-----------+----------+-------+ Subclavian               Yes       Yes                      +----------+------------+---------+-----------+----------+-------+  Left Findings:  +----------+------------+---------+-----------+----------+-------+ LEFT      CompressiblePhasicitySpontaneousPropertiesSummary +----------+------------+---------+-----------+----------+-------+ IJV           Full       Yes       Yes                      +----------+------------+---------+-----------+----------+-------+ Subclavian    Full       Yes       Yes                      +----------+------------+---------+-----------+----------+-------+ Axillary      Full       Yes       Yes                      +----------+------------+---------+-----------+----------+-------+ Brachial      Full       Yes       Yes                      +----------+------------+---------+-----------+----------+-------+ Radial        Full                                          +----------+------------+---------+-----------+----------+-------+ Ulnar         Full                                          +----------+------------+---------+-----------+----------+-------+ Cephalic      Full                                          +----------+------------+---------+-----------+----------+-------+ Basilic       Full                                          +----------+------------+---------+-----------+----------+-------+  Summary:  Right: No evidence of thrombosis in the subclavian.  Left: No evidence of deep vein thrombosis in the upper extremity. No evidence of superficial vein thrombosis in the upper extremity.  Indicental: Appearance of enlarged, hypoechoic lymph node with loss of hilar definition in the axilla.  *See table(s) above for measurements and observations.  Diagnosing physician: Harold Barban MD Electronically signed by Harold Barban MD on 02/27/2020 at 9:42:15 PM.    Final    Assessment and Plan:  Palpable left breast mass with axillary and cervical lymphadenopathy with abnormal CT scans concerning for pulmonary nodules, liver masses, and bone lesions -Discussed CT  scan findings with the patient and her family member which are concerning for metastatic breast cancer. -We discussed that we need to confirm the diagnosis with a biopsy.  Recommend ultrasound-guided biopsy of one of her liver lesions. -We discussed that there are different types of breast cancer and treatment would depend on the biopsy results. -We discussed that metastatic breast cancer is not curable but is treatable. -Further recommendations regarding treatment pending biopsy results. -Recommend MRI of the brain with and without contrast to evaluate for metastatic disease.  An order for Ativan 0.5 mg IV 30 minutes prior to MRI has been ordered due to claustrophobia.  Hypercalcemia likely due to malignancy -Corrected calcium is elevated at 11.7. -We will discuss with Dr. Chryl Heck about possible bisphosphonate therapy.  We will follow up with the patient once the biopsy results are available and make further recommendations regarding treatment options.  Thank you for this referral.   Mikey Bussing, DNP, AGPCNP-BC, AOCNP  Attending Note  I personally saw the patient, reviewed the chart and examined the patient. The plan of care was discussed with the patient and the admitting team. I agree with the assessment and plan as documented above. Thank you very much for the consultation. I agree that this presentation is highly concerning for metastatic breast cancer If she indeed is found to have ER positive breast cancer, she can be started on oral therapy and she may tolerate it very well.  CT guided liver biopsy will establish the diagnosis and the stage. Hypercalcemia likely from malignancy, mild, gave her a dose of pamidronate, Please montior CMP daily while she is here. Ok to FU outpatient with biopsy results She may benefit from [facility] .  Carrin Vannostrand M.D.

## 2020-02-28 NOTE — Progress Notes (Addendum)
NEW ADMISSION NOTE  Arrival Method: bed Mental Orientation: Alert and oriented x4 Telemetry: yes Assessment: Completed Skin: see notes Iv: right antecubital  Pain: 0 Tubes: 1 Safety Measures: Safety Fall Prevention Plan has been given, discussed and signed Admission: Completed [facility] Orientation: Patient has been orientated to the room, unit and staff.  Family: 0  Orders have been reviewed and implemented. Will continue to monitor the patient. Call light has been placed within reach and bed alarm has been activated.   Patient arrive on admission with stage 1 pressure injury on sacrum, redness across upper back bilaterally. Patient also had redness, crusting, dry, and flaky skin over left breast where possible cancer site is located. Patient also has swelling mass along left upper extremity.   Beatris Ship, RN

## 2020-02-28 NOTE — Progress Notes (Signed)
  Echocardiogram 2D Echocardiogram has been performed.  Morgan Yu 02/28/2020, 11:20 AM

## 2020-02-28 NOTE — Progress Notes (Addendum)
PROGRESS NOTE    LUNDON ROSIER  TGG:[id number] DOB: May 02, 1940 DOA: 02/27/2020 PCP: Patient, No Pcp Per   Chief Complaint  Patient presents with  . Weakness    Brief Narrative: As per admitting: 80 y.o. female is denies any significant past medical history though has not seen a physician in many years, now presenting to the emergency department with concerns for left breast mass and left arm swelling.  The patient notes that she developed pruritus and later a lump involving the left breast a few months ago and then developed some swelling of the left arm over the past 2 weeks.  She has had 1 mammogram which she states was many years ago.  Never had a colonoscopy.  She denies any chest pain, fevers, chills, leg swelling, cough, or shortness of breath.  ED Course: Upon arrival to the ED, patient is found to be afebrile, saturating well on room air, and tachycardic to 110 with stable blood pressure.  EKG features sinus tachycardia and PAC.  Chemistry panel notable for alkaline phosphatase of 652, albumin 2.6, and AST 154.  CBC is unremarkable.  High-sensitivity troponin is 18 twice.  BNP is normal.  CTA chest is negative for PE but notable for moderate bilateral pleural effusions, patchy airspace opacities, right middle and right lower lobe nodules concerning for metastatic disease, as well as diffuse hepatic and skeletal metastases.  Ultrasound of the left upper extremity is negative for DVT but notable for enlarged axillary node.  Patient was given 20 mg IV Lasix in the ED. COVID-19 PCR is pending   Subjective: Resting comfortably. Aaox3, resting,cousin at bedside no shortness of breath or chest pain.  Assessment & Plan:  Metastatic disease: left breast mass on exam CT imaging showing extensive metastasis to liver, bone , small free fluid in the abdomen and pelvis question reactive versus peritoneal metastasis, and noncalcified nodules in the RML and RLL likely metastatic, with left upper  extremity lymphedema.  Primary likely from the breast ?? - liver biopsy. Keep NPO for now.Oncology input awaited.  Palliative care also consulted.  Mild hypercalcemia- suspect 2/2 #1. Monitor  Elevated LFTs from liver metastasis.  Monitor.  Hyponatremia / metabolic acidosis:Monitor lab  Moderate bilateral pleural effusion with basilar atelectasis on Lasix.  She is not short of breath and not hypoxic.  BNP stable ruling out CHF, echo has been ordered.  Hold off on thoracentesis until further palliative care meeting.  Elevated troponin subtle increase no chest pain.  Follow-up echocardiogram likely demand mismatch from pleural effusion  Sciatica on rt leg- cvhronic  Has not seen doctor in many years.  Goals of care discussed with patient and patient's healthcare power of attorney and patient wishes for DNR and changed.  Palliative care consult has been requested in the setting of multiple metastatic disease with poor prognosis pending oncology consultation.  Nutrition: Diet Order            Diet NPO time specified Except for: Sips with Meds, Ice Chips  Diet effective midnight                 There is no height or weight on file to calculate BMI.  DVT prophylaxis: SCDs Start: 02/27/20 2048 Code Status:   Code Status: DNR  Family Communication: plan of care discussed with patient  And her cousin Madaline Savage how is HCPOA at bedside.  Status is: Inpatient Remains inpatient appropriate because:Inpatient level of care appropriate due to severity of illness  Dispo: The patient  is from: Home              Anticipated d/c is to: TBD              Anticipated d/c date is: 2 days              Patient currently is not medically stable to d/c.   Difficult to place patient No  Consultants:see note  Procedures:see note  Culture/Microbiology No results found for: SDES, SPECREQUEST, CULT, REPTSTATUS  Other culture-see note  Medications: Scheduled Meds: . [START ON 02/29/2020] furosemide  20 mg  Intravenous Daily  . sodium chloride flush  3 mL Intravenous Q12H   Continuous Infusions: . sodium chloride     Antimicrobials: Anti-infectives (From admission, onward)   None     Objective: Vitals: Today's Vitals   02/28/20 0630 02/28/20 0700 02/28/20 0930 02/28/20 1000  BP: 135/68 134/68 134/66 124/78  Pulse: 95 96 96 100  Resp: 19 20 19 19   Temp:      TempSrc:      SpO2: 92% 93% 93% 92%  PainSc:       No intake or output data in the 24 hours ending 02/28/20 1255 There were no vitals filed for this visit. Weight change:   Intake/Output from previous day: No intake/output data recorded. Intake/Output this shift: No intake/output data recorded. There were no vitals filed for this visit.  Examination: General exam: AAOX3, old for age,NAD, weak appearing. HEENT:Oral mucosa moist, Ear/Nose WNL grossly,dentition normal. Respiratory system: bilaterally DIMINISHED AT BASE,no wheezing or crackles,no use of accessory muscle, non tender. Cardiovascular system: S1 & S2 +, regular, No JVD. Gastrointestinal system: Abdomen soft, NT,ND, BS+. Nervous System:Alert, awake, moving extremities and grossly nonfocal Extremities: No edema, distal peripheral pulses palpable.  Skin: No rashes,no icterus.  Left breast with scarring lesions MSK: Normal muscle bulk,tone, power  Data Reviewed: I have personally reviewed following labs and imaging studies CBC: Recent Labs  Lab 02/27/20 1537 02/28/20 0443  WBC 9.4 9.2  HGB 13.5 12.6  HCT 40.9 38.0  MCV 83.1 83.3  PLT 394 785   Basic Metabolic Panel: Recent Labs  Lab 02/27/20 1537 02/28/20 0443  NA 134* 134*  K 4.1 3.7  CL 97* 97*  CO2 21* 21*  GLUCOSE 110* 70  BUN 31* 28*  CREATININE 0.95 0.89  CALCIUM 11.1* 10.7*   GFR: CrCl cannot be calculated (Unknown ideal weight.). Liver Function Tests: Recent Labs  Lab 02/27/20 1721 02/28/20 0443  AST 154* 143*  ALT 30 27  ALKPHOS 652* 590*  BILITOT 0.6 1.2  PROT 6.4* 5.7*   ALBUMIN 2.6* 2.2*   No results for input(s): LIPASE, AMYLASE in the last 168 hours. No results for input(s): AMMONIA in the last 168 hours. Coagulation Profile: No results for input(s): INR, PROTIME in the last 168 hours. Cardiac Enzymes: No results for input(s): CKTOTAL, CKMB, CKMBINDEX, TROPONINI in the last 168 hours. BNP (last 3 results) No results for input(s): PROBNP in the last 8760 hours. HbA1C: No results for input(s): HGBA1C in the last 72 hours. CBG: No results for input(s): GLUCAP in the last 168 hours. Lipid Profile: No results for input(s): CHOL, HDL, LDLCALC, TRIG, CHOLHDL, LDLDIRECT in the last 72 hours. Thyroid Function Tests: Recent Labs    02/27/20 1721  TSH 3.188   Anemia Panel: No results for input(s): VITAMINB12, FOLATE, FERRITIN, TIBC, IRON, RETICCTPCT in the last 72 hours. Sepsis Labs: No results for input(s): PROCALCITON, LATICACIDVEN in the last 168 hours.  Recent Results (from the past 240 hour(s))  SARS Coronavirus 2 by RT PCR (hospital order, performed in [facility] hospital lab) Nasopharyngeal Nasopharyngeal Swab     Status: None   Collection Time: 02/27/20  9:07 PM   Specimen: Nasopharyngeal Swab  Result Value Ref Range Status   SARS Coronavirus 2 NEGATIVE NEGATIVE Final    Comment: (NOTE) SARS-CoV-2 target nucleic acids are NOT DETECTED.  The SARS-CoV-2 RNA is generally detectable in upper and lower respiratory specimens during the acute phase of infection. The lowest concentration of SARS-CoV-2 viral copies this assay can detect is 250 copies / mL. A negative result does not preclude SARS-CoV-2 infection and should not be used as the sole basis for treatment or other patient management decisions.  A negative result may occur with improper specimen collection / handling, submission of specimen other than nasopharyngeal swab, presence of viral mutation(s) within the areas targeted by this assay, and inadequate number of viral copies (<250  copies / mL). A negative result must be combined with clinical observations, patient history, and epidemiological information.  Fact Sheet for Patients:   StrictlyIdeas.no  Fact Sheet for Healthcare Providers: BankingDealers.co.za  This test is not yet approved or  cleared by the [place] FDA and has been authorized for detection and/or diagnosis of SARS-CoV-2 by FDA under an Emergency Use Authorization (EUA).  This EUA will remain in effect (meaning this test can be used) for the duration of the COVID-19 declaration under Section 564(b)(1) of the Act, 21 U.S.C. section 360bbb-3(b)(1), unless the authorization is terminated or revoked sooner.  Performed at [facility], [place] [address], [place], [place] [address]      Radiology Studies: CT Angio Chest PE W and/or Wo Contrast  Result Date: 02/27/2020 CLINICAL DATA:  Acute abdominal pain. Elevated liver function test. Suspected breast cancer. Query metastasis. Query shortness of breath. Pulmonary embolus suspected with high probability. EXAM: CT ANGIOGRAPHY CHEST CT ABDOMEN AND PELVIS WITH CONTRAST TECHNIQUE: Multidetector CT imaging of the chest was performed using the standard protocol during bolus administration of intravenous contrast. Multiplanar CT image reconstructions and MIPs were obtained to evaluate the vascular anatomy. Multidetector CT imaging of the abdomen and pelvis was performed using the standard protocol during bolus administration of intravenous contrast. CONTRAST:  193mL OMNIPAQUE IOHEXOL 350 MG/ML SOLN COMPARISON:  Chest radiograph 02/27/2020 FINDINGS: CTA CHEST FINDINGS Cardiovascular: Satisfactory opacification of the pulmonary arteries to the segmental level. No evidence of pulmonary embolism. Normal heart size. No pericardial effusion. Normal caliber thoracic aorta. No aortic dissection. Mediastinum/Nodes: Esophagus is decompressed. No significant lymphadenopathy  in the mediastinum. Multiple enlarged lymph nodes in the left axilla, measuring up to 2.5 x 3.3 cm in diameter, likely metastatic. Lungs/Pleura: Moderate bilateral pleural effusions. Atelectasis in the lung bases with patchy airspace infiltrates in the remaining lungs. This could represent edema or pneumonia. Noncalcified nodule in the right middle lung measuring 9 mm in diameter, likely metastatic. Additional smaller nodules demonstrated in the right lower lung. Evaluation is limited due to motion artifact. Musculoskeletal: Diffuse skeletal metastasis with mixed sclerotic and lucent lesions throughout the visualized skeletal system. Review of the MIP images confirms the above findings. CT ABDOMEN and PELVIS FINDINGS Hepatobiliary: Multiple low-attenuation peripherally enhancing lesions demonstrated throughout the liver consistent with diffuse hepatic metastasis. Portal veins are patent. Gallbladder and bile ducts are unremarkable. Pancreas: Unremarkable. No pancreatic ductal dilatation or surrounding inflammatory changes. Spleen: Normal in size without focal abnormality. Adrenals/Urinary Tract: Adrenal glands are unremarkable. Kidneys are normal,  without renal calculi, focal lesion, or hydronephrosis. Bladder is unremarkable. Stomach/Bowel: Stomach is within normal limits. Appendix appears normal. No evidence of bowel wall thickening, distention, or inflammatory changes. Vascular/Lymphatic: Aortic atherosclerosis. No enlarged abdominal or pelvic lymph nodes. Reproductive: Uterus and bilateral adnexa are unremarkable. Other: Small amount of free fluid in the abdomen and pelvis, possibly reactive or may indicate peritoneal metastasis. Prominent upper abdominal varices. Edema in the subcutaneous fat throughout the abdominal wall. No free air. Musculoskeletal: Diffuse skeletal metastasis with mixed sclerotic and lucent lesions for about the visualized skeletal elements. Review of the MIP images confirms the above  findings. IMPRESSION: 1. No evidence of significant pulmonary embolus. 2. Moderate bilateral pleural effusions with basilar atelectasis. Patchy airspace infiltrates in the remaining lungs may represent edema or pneumonia. 3. Noncalcified nodules in the right middle lung and right lower lung, likely metastatic. 4. Diffuse hepatic metastasis. 5. Diffuse skeletal metastasis. 6. Prominent upper abdominal varices. 7. Small amount of free fluid in the abdomen and pelvis, may be reactive or may indicate peritoneal metastasis. 8. Edema in the subcutaneous fat throughout the abdominal wall. Aortic Atherosclerosis (ICD10-I70.0). Electronically Signed   By: Lucienne Capers M.D.   On: 02/27/2020 19:40   CT ABDOMEN PELVIS W CONTRAST  Result Date: 02/27/2020 CLINICAL DATA:  Acute abdominal pain. Elevated liver function test. Suspected breast cancer. Query metastasis. Query shortness of breath. Pulmonary embolus suspected with high probability. EXAM: CT ANGIOGRAPHY CHEST CT ABDOMEN AND PELVIS WITH CONTRAST TECHNIQUE: Multidetector CT imaging of the chest was performed using the standard protocol during bolus administration of intravenous contrast. Multiplanar CT image reconstructions and MIPs were obtained to evaluate the vascular anatomy. Multidetector CT imaging of the abdomen and pelvis was performed using the standard protocol during bolus administration of intravenous contrast. CONTRAST:  120mL OMNIPAQUE IOHEXOL 350 MG/ML SOLN COMPARISON:  Chest radiograph 02/27/2020 FINDINGS: CTA CHEST FINDINGS Cardiovascular: Satisfactory opacification of the pulmonary arteries to the segmental level. No evidence of pulmonary embolism. Normal heart size. No pericardial effusion. Normal caliber thoracic aorta. No aortic dissection. Mediastinum/Nodes: Esophagus is decompressed. No significant lymphadenopathy in the mediastinum. Multiple enlarged lymph nodes in the left axilla, measuring up to 2.5 x 3.3 cm in diameter, likely metastatic.  Lungs/Pleura: Moderate bilateral pleural effusions. Atelectasis in the lung bases with patchy airspace infiltrates in the remaining lungs. This could represent edema or pneumonia. Noncalcified nodule in the right middle lung measuring 9 mm in diameter, likely metastatic. Additional smaller nodules demonstrated in the right lower lung. Evaluation is limited due to motion artifact. Musculoskeletal: Diffuse skeletal metastasis with mixed sclerotic and lucent lesions throughout the visualized skeletal system. Review of the MIP images confirms the above findings. CT ABDOMEN and PELVIS FINDINGS Hepatobiliary: Multiple low-attenuation peripherally enhancing lesions demonstrated throughout the liver consistent with diffuse hepatic metastasis. Portal veins are patent. Gallbladder and bile ducts are unremarkable. Pancreas: Unremarkable. No pancreatic ductal dilatation or surrounding inflammatory changes. Spleen: Normal in size without focal abnormality. Adrenals/Urinary Tract: Adrenal glands are unremarkable. Kidneys are normal, without renal calculi, focal lesion, or hydronephrosis. Bladder is unremarkable. Stomach/Bowel: Stomach is within normal limits. Appendix appears normal. No evidence of bowel wall thickening, distention, or inflammatory changes. Vascular/Lymphatic: Aortic atherosclerosis. No enlarged abdominal or pelvic lymph nodes. Reproductive: Uterus and bilateral adnexa are unremarkable. Other: Small amount of free fluid in the abdomen and pelvis, possibly reactive or may indicate peritoneal metastasis. Prominent upper abdominal varices. Edema in the subcutaneous fat throughout the abdominal wall. No free air. Musculoskeletal: Diffuse skeletal metastasis with  mixed sclerotic and lucent lesions for about the visualized skeletal elements. Review of the MIP images confirms the above findings. IMPRESSION: 1. No evidence of significant pulmonary embolus. 2. Moderate bilateral pleural effusions with basilar atelectasis.  Patchy airspace infiltrates in the remaining lungs may represent edema or pneumonia. 3. Noncalcified nodules in the right middle lung and right lower lung, likely metastatic. 4. Diffuse hepatic metastasis. 5. Diffuse skeletal metastasis. 6. Prominent upper abdominal varices. 7. Small amount of free fluid in the abdomen and pelvis, may be reactive or may indicate peritoneal metastasis. 8. Edema in the subcutaneous fat throughout the abdominal wall. Aortic Atherosclerosis (ICD10-I70.0). Electronically Signed   By: Lucienne Capers M.D.   On: 02/27/2020 19:40   DG Chest Portable 1 View  Result Date: 02/27/2020 CLINICAL DATA:  Shortness of breath EXAM: PORTABLE CHEST 1 VIEW COMPARISON:  None. FINDINGS: There is hyperinflation of the lungs compatible with COPD. Small left pleural effusion with left base atelectasis. Right lung clear. No acute bony abnormality. Multiple old healed right rib fractures. IMPRESSION: Small left pleural effusion with left base atelectasis. COPD. Electronically Signed   By: Rolm Baptise M.D.   On: 02/27/2020 17:56   UE VENOUS DUPLEX ([facility] & [facility] 7 am - 7 pm)  Result Date: 02/27/2020 UPPER VENOUS STUDY  Indications: Swelling of LT forearm/wrist/hand. Limitations: Depth and size of vessels. Comparison Study: No prior studies available. Performing Technologist: Darlin Coco RDMS  Examination Guidelines: A complete evaluation includes B-mode imaging, spectral Doppler, color Doppler, and power Doppler as needed of all accessible portions of each vessel. Bilateral testing is considered an integral part of a complete examination. Limited examinations for reoccurring indications may be performed as noted.  Right Findings: +----------+------------+---------+-----------+----------+-------+ RIGHT     CompressiblePhasicitySpontaneousPropertiesSummary +----------+------------+---------+-----------+----------+-------+ Subclavian               Yes       Yes                       +----------+------------+---------+-----------+----------+-------+  Left Findings: +----------+------------+---------+-----------+----------+-------+ LEFT      CompressiblePhasicitySpontaneousPropertiesSummary +----------+------------+---------+-----------+----------+-------+ IJV           Full       Yes       Yes                      +----------+------------+---------+-----------+----------+-------+ Subclavian    Full       Yes       Yes                      +----------+------------+---------+-----------+----------+-------+ Axillary      Full       Yes       Yes                      +----------+------------+---------+-----------+----------+-------+ Brachial      Full       Yes       Yes                      +----------+------------+---------+-----------+----------+-------+ Radial        Full                                          +----------+------------+---------+-----------+----------+-------+ Ulnar         Full                                          +----------+------------+---------+-----------+----------+-------+  Cephalic      Full                                          +----------+------------+---------+-----------+----------+-------+ Basilic       Full                                          +----------+------------+---------+-----------+----------+-------+  Summary:  Right: No evidence of thrombosis in the subclavian.  Left: No evidence of deep vein thrombosis in the upper extremity. No evidence of superficial vein thrombosis in the upper extremity.  Indicental: Appearance of enlarged, hypoechoic lymph node with loss of hilar definition in the axilla.  *See table(s) above for measurements and observations.  Diagnosing physician: Harold Barban MD Electronically signed by Harold Barban MD on 02/27/2020 at 9:42:15 PM.    Final      LOS: 1 day   Antonieta Pert, MD [facility]  02/28/2020, 12:55 PM

## 2020-02-28 NOTE — Consult Note (Addendum)
Consultation Note Date: 02/28/2020   Patient Name: Morgan Yu  DOB: 1940-08-18  MRN: [id number]  Age / Sex: 80 y.o., female  PCP: Patient, No Pcp Per Referring Physician: Antonieta Pert, MD  Reason for Consultation: Establishing goals of care  HPI/Patient Profile: 80 y.o. female  with no known medical history though has not had medical care in many years who presented to the emergency department on 02/27/2020 with left breast mass and left arm swelling. Patient reports developing pruritus and a lump in the left breast a few months ago, then later developed some swelling of the left arm. ED Course: Stable vital signs. Labs significant for alkaline phosphatase of 652, albumin 2.6, and AST 154. CBC unremarkable. High-sensitivity troponin is 18 (twice). BNP normal. Ultrasound of the left upper extremity is negative for DVT nut notable for enlarged axillary lymph node. CTA chest is negative for PE but shows moderate bilateral pleural effusions, patchy airspace opacities, and right middle and right lower lobe nodules concerning for metastatic disease. CT abdomen shows diffuse hepatic and skeletal metastases.   Clinical Assessment and Goals of Care: I have reviewed medical records including EPIC notes, labs and imaging, and met at bedside with patient  to discuss diagnosis, prognosis, GOC, EOL wishes, disposition, and options.  I introduced Palliative Medicine as specialized medical care for people living with serious illness. It focuses on providing relief from the symptoms and stress of a serious illness.   We discussed a brief life review of the patient. She still works in accounts payable, 2 days per week. She was married to her late husband for almost 87 years. He passed away from myeloma. She has lived alone since then.   As far as functional and nutritional status, she has been independent with her ADLs. Stopped  driving 2 weeks ago because she was too weak. Has not had much appetite. When I asked about weight loss, she is unable to provide an amount in lbs but states she went from a size 12 to a size 8 in about 2 months.    We discussed her current medical situation and concern for metastatic breast cancer. She states she was not surprised by the diagnosis. She shares that when her cousin Morgan Yu asked why she didn't tell her sooner, she states she didn't want to worry her.  Patient is able to verbalize that metastatic breast cancer is not curable, but is treatable. Discussed that the treatment plan will depend on the biopsy results.   I attempted to elicit values and goals of care important to the patient. She states she wants to remain in her home for as long as possible.   The difference between aggressive medical intervention and comfort care was considered in light of the patient's goals of care. Patient verbalizes concern that she is very weak and will not be able to handle treatment.   16:30--I spoke with patient's cousin Morgan Yu by phone and shared with her the above discussion I had with patient. Morgan Yu reports that patient also expressed  to her concerns about pursuing treatment. Morgan Yu encouraged her not to make any decisions after the biopsy and I agreed this was a good plan.   Primary decision maker: Patient. Patient states her surrogate decision maker would be her cousin Morgan Yu [phone]  SUMMARY OF RECOMMENDATIONS   - DNR/DNI as previously documented - continue current medical care - liver biopsy scheduled for tomorrow - patient expresses concern that she is too weak to handle treatment - PMT will continue to follow  Code Status/Advance Care Planning:  DNR  Symptom Management:   Mirtazapine 7.5 mg at bedtime (for sleep and anorexia)  Palliative Prophylaxis:   Frequent Pain Assessment and Turn Reposition  Additional Recommendations (Limitations, Scope, Preferences):  Full Scope  Treatment for now  Psycho-social/Spiritual:   Created space and opportunity for patient and family to express thoughts and feelings regarding patient's current medical situation.   Emotional support provided   Prognosis:   Unable to determine  Discharge Planning: To Be Determined      Primary Diagnoses: Present on Admission: . Metastatic disease ([place]) . Elevated LFTs . Elevated troponin . Pleural effusion . Breast mass, left . Sinus tachycardia   I have reviewed the medical record, interviewed the patient and family, and examined the patient. The following aspects are pertinent.  History reviewed. No pertinent past medical history.  Family History  Problem Relation Age of Onset  . Stroke Mother   . Heart attack Father   . Cancer Brother    Scheduled Meds: . sodium chloride flush  3 mL Intravenous Q12H   Continuous Infusions: . sodium chloride     PRN Meds:.sodium chloride, acetaminophen **OR** acetaminophen, ondansetron **OR** ondansetron (ZOFRAN) IV, polyethylene glycol, traMADol Medications Prior to Admission:  Prior to Admission medications   Medication Sig Start Date End Date Taking? Authorizing Provider  acetaminophen (TYLENOL) 500 MG tablet Take 500 mg by mouth every 6 (six) hours as needed for moderate pain.   Yes [provider]  Probiotic Product (PROBIOTIC PO) Take 1 capsule by mouth daily.   Yes [provider]   No Known Allergies Review of Systems  Constitutional: Positive for unexpected weight change.  Neurological: Positive for weakness.    Physical Exam Vitals reviewed.  Constitutional:      General: She is not in acute distress.    Appearance: She is cachectic. She is ill-appearing.  Cardiovascular:     Rate and Rhythm: Normal rate.  Pulmonary:     Effort: Pulmonary effort is normal.  Neurological:     Mental Status: She is alert and oriented to person, place, and time.     Motor: Weakness present.     Vital  Signs: BP 124/78   Pulse 100   Temp 98.5 F (36.9 C) (Oral)   Resp 19   SpO2 92%  Pain Scale: 0-10   Pain Score: 0-No pain   SpO2: SpO2: 92 % O2 Device:SpO2: 92 % O2 Flow Rate: .   IO: Intake/output summary: No intake or output data in the 24 hours ending 02/28/20 1111     Palliative Assessment/Data PPS 30-40%     Time In: 15:00 Time Out: 16:13 Time Total: 73 minutes Greater than 50%  of this time was spent counseling and coordinating care related to the above assessment and plan.  Signed by: Lavena Bullion, NP   Please contact Palliative Medicine Team phone at [phone] for questions and concerns.  For individual provider: See Shea Evans

## 2020-02-29 ENCOUNTER — Inpatient Hospital Stay (HOSPITAL_COMMUNITY): Payer: Medicare HMO

## 2020-02-29 LAB — CBC
HCT: 37.7 % (ref 36.0–46.0)
Hemoglobin: 11.9 g/dL — ABNORMAL LOW (ref 12.0–15.0)
MCH: 26.6 pg (ref 26.0–34.0)
MCHC: 31.6 g/dL (ref 30.0–36.0)
MCV: 84.3 fL (ref 80.0–100.0)
Platelets: 362 10*3/uL (ref 150–400)
RBC: 4.47 MIL/uL (ref 3.87–5.11)
RDW: 21.1 % — ABNORMAL HIGH (ref 11.5–15.5)
WBC: 8.4 10*3/uL (ref 4.0–10.5)
nRBC: 0 % (ref 0.0–0.2)

## 2020-02-29 LAB — COMPREHENSIVE METABOLIC PANEL
ALT: 26 U/L (ref 0–44)
AST: 143 U/L — ABNORMAL HIGH (ref 15–41)
Albumin: 2.3 g/dL — ABNORMAL LOW (ref 3.5–5.0)
Alkaline Phosphatase: 583 U/L — ABNORMAL HIGH (ref 38–126)
Anion gap: 17 — ABNORMAL HIGH (ref 5–15)
BUN: 32 mg/dL — ABNORMAL HIGH (ref 8–23)
CO2: 21 mmol/L — ABNORMAL LOW (ref 22–32)
Calcium: 11.1 mg/dL — ABNORMAL HIGH (ref 8.9–10.3)
Chloride: 98 mmol/L (ref 98–111)
Creatinine, Ser: 0.91 mg/dL (ref 0.44–1.00)
GFR, Estimated: >60 mL/min (ref >60)
Glucose, Bld: 63 mg/dL — ABNORMAL LOW (ref 70–99)
Potassium: 3.8 mmol/L (ref 3.5–5.1)
Sodium: 136 mmol/L (ref 135–145)
Total Bilirubin: 1.4 mg/dL — ABNORMAL HIGH (ref 0.3–1.2)
Total Protein: 5.7 g/dL — ABNORMAL LOW (ref 6.5–8.1)

## 2020-02-29 LAB — PROTIME-INR
INR: 1 (ref 0.8–1.2)
Prothrombin Time: 13.1 seconds (ref 11.4–15.2)

## 2020-02-29 LAB — GLUCOSE, CAPILLARY
Glucose-Capillary: 140 mg/dL — ABNORMAL HIGH (ref 70–99)
Glucose-Capillary: 55 mg/dL — ABNORMAL LOW (ref 70–99)
Glucose-Capillary: 91 mg/dL (ref 70–99)

## 2020-02-29 MED ORDER — LIDOCAINE HCL (PF) 1 % IJ SOLN
INTRAMUSCULAR | Status: AC
  Filled 2020-02-29: qty 30

## 2020-02-29 MED ORDER — DEXTROSE 50 % IV SOLN
INTRAVENOUS | Status: AC
  Filled 2020-02-29: qty 50

## 2020-02-29 MED ORDER — DEXTROSE 50 % IV SOLN
12.5000 g | INTRAVENOUS | Status: AC
  Administered 2020-02-29: 12.5 g via INTRAVENOUS

## 2020-02-29 MED ORDER — MIDAZOLAM HCL 2 MG/2ML IJ SOLN
INTRAMUSCULAR | Status: AC
  Filled 2020-02-29: qty 2

## 2020-02-29 MED ORDER — DEXTROSE-NACL 5-0.9 % IV SOLN: INTRAVENOUS | Status: DC

## 2020-02-29 MED ORDER — SODIUM CHLORIDE 0.9 % IV SOLN
INTRAVENOUS | Status: AC | PRN
  Administered 2020-02-29: 50 mL/h via INTRAVENOUS

## 2020-02-29 MED ORDER — LIDOCAINE HCL (PF) 1 % IJ SOLN
INTRAMUSCULAR | Status: AC | PRN
  Administered 2020-02-29: 2 mL

## 2020-02-29 MED ORDER — MIDAZOLAM HCL 2 MG/2ML IJ SOLN
INTRAMUSCULAR | Status: AC | PRN
  Administered 2020-02-29: 0.5 mg via INTRAVENOUS

## 2020-02-29 MED ORDER — FENTANYL CITRATE (PF) 100 MCG/2ML IJ SOLN
INTRAMUSCULAR | Status: AC | PRN
  Administered 2020-02-29: 25 ug via INTRAVENOUS

## 2020-02-29 MED ORDER — FENTANYL CITRATE (PF) 100 MCG/2ML IJ SOLN
INTRAMUSCULAR | Status: AC
  Filled 2020-02-29: qty 2

## 2020-02-29 NOTE — Plan of Care (Signed)
  Problem: Education: Goal: Knowledge of General Education information will improve Description Including pain rating scale, medication(s)/side effects and non-pharmacologic comfort measures Outcome: Progressing   Problem: Health Behavior/Discharge Planning: Goal: Ability to manage health-related needs will improve Outcome: Progressing   

## 2020-02-29 NOTE — TOC Initial Note (Addendum)
Transition of Care [facility]) - Initial/Assessment Note    Patient Details  Name: Morgan Yu MRN: [id number] Date of Birth: 31-Oct-1940  [facility]) CM/SW Contact:    Bartholomew Crews, RN Phone Number: [phone] 02/29/2020, 3:31 PM  Clinical Narrative:           Spoke with patient's cousin, Edd Fabian, at bedside. Patient is currently in a procedure. PTA home alone with cat and dog - family assisting with animal care. Her husband passed away almost 4 years ago.  No DME. Patient had been independent. No DME. No HH or caregiver services needed.   Discussed request for PCP. Patient has not had medical care in years. Advised that patient can call member number on her insurance card for in network providers. Will follow up with patient as well.  Edd Fabian stated that she is HCPOA. Requested copy of document. Gayle verbalized understanding.   TOC following for transition needs.          Expected Discharge Plan: [place] Barriers to Discharge: Continued Medical Work up   Patient Goals and CMS Choice        Expected Discharge Plan and Services Expected Discharge Plan: [place] In-house Referral: Hospice / Palliative Care Discharge Planning Services: CM Consult   Living arrangements for the past 2 months: Single Family Home                                      Prior Living Arrangements/Services Living arrangements for the past 2 months: Single Family Home Lives with:: Self                Criminal Activity/Legal Involvement Pertinent to Current Situation/Hospitalization: No - Comment as needed  Activities of Daily Living      Permission Sought/Granted                  Emotional Assessment              Admission diagnosis:  Liver metastases ([place]) [C78.7] 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 Pressure injury of skin 02/28/2020  . Metastatic disease ([place]) 02/27/2020  . Elevated LFTs 02/27/2020  . Elevated troponin 02/27/2020  . Pleural effusion 02/27/2020  . Breast mass, left 02/27/2020  . Sinus tachycardia 02/27/2020   PCP:  Patient, No Pcp Per Pharmacy:   [id number] - [place], [place] - [address] [place] [address] Phone: [phone] Fax: [phone]     Social Determinants of Health (SDOH) Interventions    Readmission Risk Interventions No flowsheet data found.

## 2020-02-29 NOTE — Evaluation (Signed)
Occupational Therapy Evaluation Patient Details Name: Morgan Yu MRN: [id number] DOB: Jan 26, 1941 Today's Date: 02/29/2020    History of Present Illness Pt is a 80 y/o female admitted secondary to worsening LUE swelling. Found to have L breast mass with concern for metastasis. No pertinent PMH in the chart.   Clinical Impression   PTA patient was living alone and reports independence with self-care tasks and household mobility without AD. Patient was driving until a few weeks ago. Patient currently functioning below baseline with deficits in strength, activity tolerance, and cardiopulmonary status requiring supplemental O2 via [place]. SpO2 93% on 4L with short-distance functional transfers. Patient reports no use of supplemental O2 at home. Patient would benefit from continued acute OT services in prep for safe d/c home with recommendation for HHOT and 24hr supervision/assist from family. Daughter present at bedside reports ability to provide necessary supervision/assist.     Follow Up Recommendations  Home health OT;Supervision/Assistance - 24 hour    Equipment Recommendations  3 in 1 bedside commode    Recommendations for Other Services       Precautions / Restrictions Precautions Precautions: Fall Precaution Comments: 4L O2 via [place]. Not on O2 at baseline. Restrictions Weight Bearing Restrictions: No      Mobility Bed Mobility Overal bed mobility: Needs Assistance             General bed mobility comments: Patient seated EOB upon entry.    Transfers Overall transfer level: Needs assistance Equipment used: 1 person hand held assist Transfers: Sit to/from [facility] Sit to Stand: Min guard Stand pivot transfers: Min assist       General transfer comment: Min guard A for steadying and safety with sit to stand transfers. Min A for stand-pivot to and from [facility] without AD. Patient reaching out for furniture.    Balance Overall balance assessment: Needs  assistance Sitting-balance support: No upper extremity supported;Feet supported Sitting balance-Leahy Scale: Good     Standing balance support: Single extremity supported;During functional activity Standing balance-Leahy Scale: Fair Standing balance comment: Able to maintain static standing during toileting tasks with Min guard for steadying.                           ADL either performed or assessed with clinical judgement   ADL Overall ADL's : Needs assistance/impaired                         Toilet Transfer: Min Psychiatric nurse Details (indicate cue type and reason): HHA +1 for SPT to [facility]. Toileting- Clothing Manipulation and Hygiene: Minimal assistance;Sit to/from stand Toileting - Clothing Manipulation Details (indicate cue type and reason): Assist with hygiene management for thoroughness.     Functional mobility during ADLs: Min guard General ADL Comments: Min guard for short-distance mobility with +1 HHA.     Vision Baseline Vision/History: Wears glasses Wears Glasses: At all times Patient Visual Report: No change from baseline Vision Assessment?: No apparent visual deficits     Perception     Praxis      Pertinent Vitals/Pain Pain Assessment: 0-10 Pain Score: 7  Pain Location: RLE sciatica Pain Descriptors / Indicators: Sharp Pain Intervention(s): Limited activity within patient's tolerance;Monitored during session;Repositioned     Hand Dominance Right   Extremity/Trunk Assessment Upper Extremity Assessment Upper Extremity Assessment: Generalized weakness LUE Deficits / Details: LUE swelling noted throughout arm.   Lower Extremity Assessment Lower Extremity Assessment:  Defer to PT evaluation   Cervical / Trunk Assessment Cervical / Trunk Assessment: Kyphotic   Communication Communication Communication: No difficulties   Cognition Arousal/Alertness: Awake/alert Behavior During Therapy: WFL for tasks  assessed/performed Overall Cognitive Status: Impaired/Different from baseline Area of Impairment: Safety/judgement                         Safety/Judgement: Decreased awareness of safety     General Comments: A&Ox4. Able to follow 1-2 step verbal commands with good accuracy. Cues for safety awareness. Patient mildly impulsive attempting to stand before OT was ready.   General Comments  Edematous LUE.    Exercises     Shoulder Instructions      Home Living Family/patient expects to be discharged to:: Private residence Living Arrangements: Alone Available Help at Discharge: Family;Available 24 hours/day Type of Home: House Home Access: Stairs to enter [employer] of Steps: 3 Entrance Stairs-Rails: None Home Layout: One level     Bathroom Shower/Tub: Tub/shower unit;Walk-in shower   Bathroom Toilet: Handicapped height     Home Equipment: None          Prior Functioning/Environment Level of Independence: Independent        Comments: Independent with household mobility without AD. Does not drive. Completes ADLs/IADLs including light meal prep with I.        OT Problem List: Decreased strength;Impaired balance (sitting and/or standing);Decreased safety awareness;Decreased knowledge of use of DME or AE      OT Treatment/Interventions: Self-care/ADL training;Therapeutic exercise;Energy conservation;DME and/or AE instruction;Patient/family education;Balance training    OT Goals(Current goals can be found in the care plan section) Acute Rehab OT Goals Patient Stated Goal: to go home OT Goal Formulation: With patient Time For Goal Achievement: 03/14/20 Potential to Achieve Goals: Good ADL Goals Pt Will Perform Grooming: with supervision;standing Pt Will Perform Upper Body Dressing: sitting;Independently Pt Will Perform Lower Body Dressing: with supervision;sit to/from stand Pt Will Transfer to Toilet: with supervision;ambulating Pt Will Perform  Toileting - Clothing Manipulation and hygiene: with supervision;sit to/from stand Pt/caregiver will Perform Home Exercise Program: Both right and left upper extremity;With written HEP provided  OT Frequency: Min 2X/week   Barriers to D/C:            Co-evaluation              AM-PAC OT "6 Clicks" Daily Activity     Outcome Measure Help from another person eating meals?: None Help from another person taking care of personal grooming?: A Little Help from another person toileting, which includes using toliet, bedpan, or urinal?: A Little Help from another person bathing (including washing, rinsing, drying)?: A Little Help from another person to put on and taking off regular upper body clothing?: None Help from another person to put on and taking off regular lower body clothing?: A Little 6 Click Score: 20   End of Session Equipment Utilized During Treatment: Gait belt Nurse Communication: Mobility status  Activity Tolerance: Patient tolerated treatment well Patient left: in chair;with call bell/phone within reach;with family/visitor present  OT Visit Diagnosis: Unsteadiness on feet (R26.81);Muscle weakness (generalized) (M62.81)                Time: 3329-5188 OT Time Calculation (min): 25 min Charges:  OT General Charges $OT Visit: 1 Visit OT Evaluation $OT Eval Moderate Complexity: 1 Mod OT Treatments $Self Care/Home Management : 8-22 mins  Sharena Dibenedetto H. OTR/L Supplemental OT, Department of rehab services [phone]  [facility]  R H. 02/29/2020, 12:06 PM

## 2020-02-29 NOTE — Progress Notes (Signed)
Patient oxygen saturation dropped to 90% on room air, RN placed patient on 4L of oxygen and she is currently saturating at 92%. MD has been notified and RN will continue to monitor this patient

## 2020-02-29 NOTE — TOC Progression Note (Signed)
Transition of Care [facility]) - Progression Note    Patient Details  Name: Morgan Yu MRN: [id number] Date of Birth: Mar 13, 1940  Transition of Care [facility]) CM/SW Contact  Bartholomew Crews, RN Phone Number: [phone] 02/29/2020, 6:01 PM  Clinical Narrative:     Spoke with patient and Edd Fabian at the bedside to introduce self and role. Patient agreeable to ongoing conversations with [facility] as needed. TOC following for transition needs.   Expected Discharge Plan: [place] Barriers to Discharge: Continued Medical Work up  Expected Discharge Plan and Services Expected Discharge Plan: [place] In-house Referral: Hospice / Palliative Care Discharge Planning Services: CM Consult   Living arrangements for the past 2 months: Single Family Home                                       Social Determinants of Health (SDOH) Interventions    Readmission Risk Interventions No flowsheet data found.

## 2020-02-29 NOTE — Procedures (Signed)
Interventional Radiology Procedure Note  Procedure:   US guided biopsy of left axillary/chest wall mass.  This is the least invasive, lowest risk site for sample.   Mx 16G core.   Complications: None  Recommendations:  - Routine wound care - Do not submerge - follow up path   Signed,  Dulcy Fanny. Earleen Newport, DO

## 2020-02-29 NOTE — Progress Notes (Addendum)
PROGRESS NOTE    Morgan Yu  BZJ:[id number] DOB: [birth year] DOA: 02/27/2020 PCP: Patient, No Pcp Per   Chief Complaint  Patient presents with  . Weakness    Brief Narrative: As per admitting: 80 y.o. female is denies any significant past medical history though has not seen a physician in many years, now presenting to the emergency department with concerns for left breast mass and left arm swelling.  The patient notes that she developed pruritus and later a lump involving the left breast a few months ago and then developed some swelling of the left arm over the past 2 weeks.  She has had 1 mammogram which she states was many years ago.  Never had a colonoscopy.  She denies any chest pain, fevers, chills, leg swelling, cough, or shortness of breath.  ED Course:Upon arrival to the ED, patient is found to be afebrile, saturating well on room air, and tachycardic to 110 with stable blood pressure.  EKG features sinus tachycardia and PAC.  Chemistry panel notable for alkaline phosphatase of 652, albumin 2.6, and AST 154.  CBC is unremarkable.  High-sensitivity troponin is 18 twice.  BNP is normal.  CTA chest is negative for PE but notable for moderate bilateral pleural effusions, patchy airspace opacities, right middle and right lower lobe nodules concerning for metastatic disease, as well as diffuse hepatic and skeletal metastases.  Ultrasound of the left upper extremity is negative for DVT but notable for enlarged axillary node.  Patient was given 20 mg IV Lasix in the ED.   Subjective:  Patient somewhat sleepy this morning-attributes to the sleeping medication she received last night. CBG this morning shows 63-asked nursing staff to check and CBG showed 55 given 50% dextrose-improved to 140. Patient has no new complaints no pain.  Assessment & Plan:  Metastatic disease w/ palpable left breast mass, axillary and cervical lymphadenopathy and CT scan with pulmonary nodules, liver masses and bone  lesions concerning for metastatic disease:  Appreciate oncology input awaiting for biopsy of liver versus lymph nodes-defer to IR/oncology.  Hypoglycemia CBG 63>55 , likely from n.p.o., start on D5 normal saline and is status post 50% dextrose, CBG POCT ordered and started on D5 normal saline.   Hypercalcemia of malignancy continue to monitor CMP.  Defer to oncology  Mild metabolic acidosis likely from liver mets dehydration.  Added IV fluids.  Elevated LFTs from liver metastasis.  Monitor.  Hyponatremia / metabolic acidosis:Monitor lab continue gentle hydration this morning  Moderate bilateral pleural effusion with basilar atelectasis on Lasix.  On IV Lasix.  Not hypoxic. bnp stable.  Elevated troponin subtle increase no chest pain.  Echo shows EF 65 to 70% no regional WMA likely demand mismatch from pleural effusion  Sciatica on rt leg- cvhronic  Has not seen doctor in many years.  Goals of care palliative care is following closely.  Status changed to DNR after discussion with patient and POA.  Overall prognosis remains poor pending biopsy result  Nutrition: Diet Order            Diet NPO time specified Except for: Sips with Meds  Diet effective midnight                 There is no height or weight on file to calculate BMI.  DVT prophylaxis: SCDs Start: 02/27/20 2048 Code Status:   Code Status: DNR  Family Communication: plan of care was discussed with patient  and her cousin Gale-HCPOA . Status is: Inpatient Remains  inpatient appropriate because:Inpatient level of care appropriate due to severity of illness Dispo: The patient is from: Home              Anticipated d/c is to: TBD              Anticipated d/c date is: 2 days              Patient currently is not medically stable to d/c.   Difficult to place patient No  Consultants:see note  Procedures:see note  Culture/Microbiology No results found for: SDES, SPECREQUEST, CULT, REPTSTATUS  Other culture-see  note  Medications: Scheduled Meds: . furosemide  20 mg Intravenous Daily  . mirtazapine  7.5 mg Oral QHS  . sodium chloride flush  3 mL Intravenous Q12H   Continuous Infusions: . sodium chloride    . dextrose 5 % and 0.9% NaCl     Antimicrobials: Anti-infectives (From admission, onward)   None     Objective: Vitals: Today's Vitals   02/28/20 1702 02/28/20 1747 02/28/20 2028 02/29/20 0334  BP: 139/67 136/60 129/62 140/66  Pulse: 96 97 97 94  Resp: (!) 28 20 20 14   Temp: 98.3 F (36.8 C) 98.1 F (36.7 C) 97.7 F (36.5 C) 97.7 F (36.5 C)  TempSrc: Oral Oral Oral Oral  SpO2: 94% 92% 92% 92%  Weight:  39.4 kg    PainSc:   0-No pain     Intake/Output Summary (Last 24 hours) at 02/29/2020 0743 Last data filed at 02/29/2020 0600 Gross per 24 hour  Intake 240 ml  Output 120 ml  Net 120 ml   Filed Weights   02/28/20 1747  Weight: 39.4 kg   Weight change:   Intake/Output from previous day: 01/27 0701 - 01/28 0700 In: 240 [P.O.:240] Out: 120 [Urine:120] Intake/Output this shift: No intake/output data recorded. Filed Weights   02/28/20 1747  Weight: 39.4 kg    Examination: General exam: AAOx3,ill frail looking.NAD,weak appearing. HEENT:Oral mucosa moist, Ear/Nose WNL grossly, dentition normal. Respiratory system: bilaterally clear,no wheezing or crackles,no use of accessory muscle. Cardiovascular system: S1 & S2 +,No JVD. Gastrointestinal system: Abdomen soft,NT,ND,BS+. Nervous System:Alert, awake, moving extremities and grossly nonfocal. Extremities: No edema, distal peripheral pulses palpable.  Skin: No rashes,no icterus. MSK: Normal muscle bulk,tone, power.  Data Reviewed: I have personally reviewed following labs and imaging studies CBC: Recent Labs  Lab 02/27/20 1537 02/28/20 0443 02/29/20 0515  WBC 9.4 9.2 8.4  HGB 13.5 12.6 11.9*  HCT 40.9 38.0 37.7  MCV 83.1 83.3 84.3  PLT 394 349 053   Basic Metabolic Panel: Recent Labs  Lab  02/27/20 1537 02/28/20 0443 02/29/20 0515  NA 134* 134* 136  K 4.1 3.7 3.8  CL 97* 97* 98  CO2 21* 21* 21*  GLUCOSE 110* 70 63*  BUN 31* 28* 32*  CREATININE 0.95 0.89 0.91  CALCIUM 11.1* 10.7* 11.1*   GFR: CrCl cannot be calculated (Unknown ideal weight.). Liver Function Tests: Recent Labs  Lab 02/27/20 1721 02/28/20 0443 02/29/20 0515  AST 154* 143* 143*  ALT 30 27 26   ALKPHOS 652* 590* 583*  BILITOT 0.6 1.2 1.4*  PROT 6.4* 5.7* 5.7*  ALBUMIN 2.6* 2.2* 2.3*   No results for input(s): LIPASE, AMYLASE in the last 168 hours. No results for input(s): AMMONIA in the last 168 hours. Coagulation Profile: Recent Labs  Lab 02/29/20 0515  INR 1.0   Cardiac Enzymes: No results for input(s): CKTOTAL, CKMB, CKMBINDEX, TROPONINI in the last 168 hours. BNP (  last 3 results) No results for input(s): PROBNP in the last 8760 hours. HbA1C: No results for input(s): HGBA1C in the last 72 hours. CBG: No results for input(s): GLUCAP in the last 168 hours. Lipid Profile: No results for input(s): CHOL, HDL, LDLCALC, TRIG, CHOLHDL, LDLDIRECT in the last 72 hours. Thyroid Function Tests: Recent Labs    02/27/20 1721  TSH 3.188   Anemia Panel: No results for input(s): VITAMINB12, FOLATE, FERRITIN, TIBC, IRON, RETICCTPCT in the last 72 hours. Sepsis Labs: No results for input(s): PROCALCITON, LATICACIDVEN in the last 168 hours.  Recent Results (from the past 240 hour(s))  SARS Coronavirus 2 by RT PCR (hospital order, performed in [facility] hospital lab) Nasopharyngeal Nasopharyngeal Swab     Status: None   Collection Time: 02/27/20  9:07 PM   Specimen: Nasopharyngeal Swab  Result Value Ref Range Status   SARS Coronavirus 2 NEGATIVE NEGATIVE Final    Comment: (NOTE) SARS-CoV-2 target nucleic acids are NOT DETECTED.  The SARS-CoV-2 RNA is generally detectable in upper and lower respiratory specimens during the acute phase of infection. The lowest concentration of SARS-CoV-2  viral copies this assay can detect is 250 copies / mL. A negative result does not preclude SARS-CoV-2 infection and should not be used as the sole basis for treatment or other patient management decisions.  A negative result may occur with improper specimen collection / handling, submission of specimen other than nasopharyngeal swab, presence of viral mutation(s) within the areas targeted by this assay, and inadequate number of viral copies (<250 copies / mL). A negative result must be combined with clinical observations, patient history, and epidemiological information.  Fact Sheet for Patients:   StrictlyIdeas.no  Fact Sheet for Healthcare Providers: BankingDealers.co.za  This test is not yet approved or  cleared by the [place] FDA and has been authorized for detection and/or diagnosis of SARS-CoV-2 by FDA under an Emergency Use Authorization (EUA).  This EUA will remain in effect (meaning this test can be used) for the duration of the COVID-19 declaration under Section 564(b)(1) of the Act, 21 U.S.C. section 360bbb-3(b)(1), unless the authorization is terminated or revoked sooner.  Performed at [facility], [place] [address], [place], [place] [address]      Radiology Studies: CT Angio Chest PE W and/or Wo Contrast  Result Date: 02/27/2020 CLINICAL DATA:  Acute abdominal pain. Elevated liver function test. Suspected breast cancer. Query metastasis. Query shortness of breath. Pulmonary embolus suspected with high probability. EXAM: CT ANGIOGRAPHY CHEST CT ABDOMEN AND PELVIS WITH CONTRAST TECHNIQUE: Multidetector CT imaging of the chest was performed using the standard protocol during bolus administration of intravenous contrast. Multiplanar CT image reconstructions and MIPs were obtained to evaluate the vascular anatomy. Multidetector CT imaging of the abdomen and pelvis was performed using the standard protocol during bolus  administration of intravenous contrast. CONTRAST:  181mL OMNIPAQUE IOHEXOL 350 MG/ML SOLN COMPARISON:  Chest radiograph 02/27/2020 FINDINGS: CTA CHEST FINDINGS Cardiovascular: Satisfactory opacification of the pulmonary arteries to the segmental level. No evidence of pulmonary embolism. Normal heart size. No pericardial effusion. Normal caliber thoracic aorta. No aortic dissection. Mediastinum/Nodes: Esophagus is decompressed. No significant lymphadenopathy in the mediastinum. Multiple enlarged lymph nodes in the left axilla, measuring up to 2.5 x 3.3 cm in diameter, likely metastatic. Lungs/Pleura: Moderate bilateral pleural effusions. Atelectasis in the lung bases with patchy airspace infiltrates in the remaining lungs. This could represent edema or pneumonia. Noncalcified nodule in the right middle lung measuring 9 mm in diameter,  likely metastatic. Additional smaller nodules demonstrated in the right lower lung. Evaluation is limited due to motion artifact. Musculoskeletal: Diffuse skeletal metastasis with mixed sclerotic and lucent lesions throughout the visualized skeletal system. Review of the MIP images confirms the above findings. CT ABDOMEN and PELVIS FINDINGS Hepatobiliary: Multiple low-attenuation peripherally enhancing lesions demonstrated throughout the liver consistent with diffuse hepatic metastasis. Portal veins are patent. Gallbladder and bile ducts are unremarkable. Pancreas: Unremarkable. No pancreatic ductal dilatation or surrounding inflammatory changes. Spleen: Normal in size without focal abnormality. Adrenals/Urinary Tract: Adrenal glands are unremarkable. Kidneys are normal, without renal calculi, focal lesion, or hydronephrosis. Bladder is unremarkable. Stomach/Bowel: Stomach is within normal limits. Appendix appears normal. No evidence of bowel wall thickening, distention, or inflammatory changes. Vascular/Lymphatic: Aortic atherosclerosis. No enlarged abdominal or pelvic lymph nodes.  Reproductive: Uterus and bilateral adnexa are unremarkable. Other: Small amount of free fluid in the abdomen and pelvis, possibly reactive or may indicate peritoneal metastasis. Prominent upper abdominal varices. Edema in the subcutaneous fat throughout the abdominal wall. No free air. Musculoskeletal: Diffuse skeletal metastasis with mixed sclerotic and lucent lesions for about the visualized skeletal elements. Review of the MIP images confirms the above findings. IMPRESSION: 1. No evidence of significant pulmonary embolus. 2. Moderate bilateral pleural effusions with basilar atelectasis. Patchy airspace infiltrates in the remaining lungs may represent edema or pneumonia. 3. Noncalcified nodules in the right middle lung and right lower lung, likely metastatic. 4. Diffuse hepatic metastasis. 5. Diffuse skeletal metastasis. 6. Prominent upper abdominal varices. 7. Small amount of free fluid in the abdomen and pelvis, may be reactive or may indicate peritoneal metastasis. 8. Edema in the subcutaneous fat throughout the abdominal wall. Aortic Atherosclerosis (ICD10-I70.0). Electronically Signed   By: Lucienne Capers M.D.   On: 02/27/2020 19:40   MR Brain W Wo Contrast  Result Date: 02/28/2020 CLINICAL DATA:  Breast cancer, staging EXAM: MRI HEAD WITHOUT AND WITH CONTRAST TECHNIQUE: Multiplanar, multiecho pulse sequences of the brain and surrounding structures were obtained without and with intravenous contrast. CONTRAST:  15mL GADAVIST GADOBUTROL 1 MMOL/ML IV SOLN COMPARISON:  None. FINDINGS: Brain: There is no acute infarction or intracranial hemorrhage. There is no intracranial mass, mass effect, or edema. There is no hydrocephalus or extra-axial fluid collection. Prominence of the ventricles and sulci reflects minor generalized parenchymal volume loss. Patchy foci of T2 hyperintensity in the supratentorial white matter are nonspecific but probably reflect mild chronic microvascular ischemic changes. Punctate  focus of susceptibility in the right temporal subcortical white matter likely reflects chronic microhemorrhage. No abnormal enhancement. Vascular: Major vessel flow voids at the skull base are preserved. Skull and upper cervical spine: Abnormal T1 marrow signal in the visualized cervical spine. Patchy abnormal marrow signal the calvarium and skull base. Sinuses/Orbits: Paranasal sinuses are aerated. Orbits are unremarkable. Other: Sella is unremarkable.  Mastoid air cells are clear. IMPRESSION: No evidence of intracranial metastatic disease. Diffuse osseous metastatic disease. Mild chronic microvascular ischemic changes. Electronically Signed   By: Macy Mis M.D.   On: 02/28/2020 13:57   CT ABDOMEN PELVIS W CONTRAST  Result Date: 02/27/2020 CLINICAL DATA:  Acute abdominal pain. Elevated liver function test. Suspected breast cancer. Query metastasis. Query shortness of breath. Pulmonary embolus suspected with high probability. EXAM: CT ANGIOGRAPHY CHEST CT ABDOMEN AND PELVIS WITH CONTRAST TECHNIQUE: Multidetector CT imaging of the chest was performed using the standard protocol during bolus administration of intravenous contrast. Multiplanar CT image reconstructions and MIPs were obtained to evaluate the vascular anatomy. Multidetector CT imaging  of the abdomen and pelvis was performed using the standard protocol during bolus administration of intravenous contrast. CONTRAST:  131mL OMNIPAQUE IOHEXOL 350 MG/ML SOLN COMPARISON:  Chest radiograph 02/27/2020 FINDINGS: CTA CHEST FINDINGS Cardiovascular: Satisfactory opacification of the pulmonary arteries to the segmental level. No evidence of pulmonary embolism. Normal heart size. No pericardial effusion. Normal caliber thoracic aorta. No aortic dissection. Mediastinum/Nodes: Esophagus is decompressed. No significant lymphadenopathy in the mediastinum. Multiple enlarged lymph nodes in the left axilla, measuring up to 2.5 x 3.3 cm in diameter, likely metastatic.  Lungs/Pleura: Moderate bilateral pleural effusions. Atelectasis in the lung bases with patchy airspace infiltrates in the remaining lungs. This could represent edema or pneumonia. Noncalcified nodule in the right middle lung measuring 9 mm in diameter, likely metastatic. Additional smaller nodules demonstrated in the right lower lung. Evaluation is limited due to motion artifact. Musculoskeletal: Diffuse skeletal metastasis with mixed sclerotic and lucent lesions throughout the visualized skeletal system. Review of the MIP images confirms the above findings. CT ABDOMEN and PELVIS FINDINGS Hepatobiliary: Multiple low-attenuation peripherally enhancing lesions demonstrated throughout the liver consistent with diffuse hepatic metastasis. Portal veins are patent. Gallbladder and bile ducts are unremarkable. Pancreas: Unremarkable. No pancreatic ductal dilatation or surrounding inflammatory changes. Spleen: Normal in size without focal abnormality. Adrenals/Urinary Tract: Adrenal glands are unremarkable. Kidneys are normal, without renal calculi, focal lesion, or hydronephrosis. Bladder is unremarkable. Stomach/Bowel: Stomach is within normal limits. Appendix appears normal. No evidence of bowel wall thickening, distention, or inflammatory changes. Vascular/Lymphatic: Aortic atherosclerosis. No enlarged abdominal or pelvic lymph nodes. Reproductive: Uterus and bilateral adnexa are unremarkable. Other: Small amount of free fluid in the abdomen and pelvis, possibly reactive or may indicate peritoneal metastasis. Prominent upper abdominal varices. Edema in the subcutaneous fat throughout the abdominal wall. No free air. Musculoskeletal: Diffuse skeletal metastasis with mixed sclerotic and lucent lesions for about the visualized skeletal elements. Review of the MIP images confirms the above findings. IMPRESSION: 1. No evidence of significant pulmonary embolus. 2. Moderate bilateral pleural effusions with basilar atelectasis.  Patchy airspace infiltrates in the remaining lungs may represent edema or pneumonia. 3. Noncalcified nodules in the right middle lung and right lower lung, likely metastatic. 4. Diffuse hepatic metastasis. 5. Diffuse skeletal metastasis. 6. Prominent upper abdominal varices. 7. Small amount of free fluid in the abdomen and pelvis, may be reactive or may indicate peritoneal metastasis. 8. Edema in the subcutaneous fat throughout the abdominal wall. Aortic Atherosclerosis (ICD10-I70.0). Electronically Signed   By: Lucienne Capers M.D.   On: 02/27/2020 19:40   DG Chest Portable 1 View  Result Date: 02/27/2020 CLINICAL DATA:  Shortness of breath EXAM: PORTABLE CHEST 1 VIEW COMPARISON:  None. FINDINGS: There is hyperinflation of the lungs compatible with COPD. Small left pleural effusion with left base atelectasis. Right lung clear. No acute bony abnormality. Multiple old healed right rib fractures. IMPRESSION: Small left pleural effusion with left base atelectasis. COPD. Electronically Signed   By: Rolm Baptise M.D.   On: 02/27/2020 17:56   ECHOCARDIOGRAM COMPLETE  Result Date: 02/28/2020    ECHOCARDIOGRAM REPORT   Patient Name:   ANAVI BRANSCUM Date of Exam: 02/28/2020 Medical Rec #:  [id number]      Height: Accession #:    [id number]     Weight: Date of Birth:  01-19-1941       BSA: Patient Age:    75 years       BP:           124/78 mmHg  Patient Gender: F              HR:           100 bpm. Exam Location:  Inpatient Procedure: 2D Echo, Cardiac Doppler and Color Doppler Indications:    Dyspnea  History:        Patient has no prior history of Echocardiogram examinations.                 Signs/Symptoms:Dyspnea. New metastatic breast cancer, bilateral                 pleural effusions.  Sonographer:    Dustin Flock Referring Phys: [phone] [place]  1. Left ventricular ejection fraction, by estimation, is 65 to 70%. The left ventricle has normal function. The left ventricle has no regional wall  motion abnormalities. Left ventricular diastolic parameters are consistent with Grade I diastolic dysfunction (impaired relaxation).  2. Right ventricular systolic function is normal. The right ventricular size is normal. There is normal pulmonary artery systolic pressure. The estimated right ventricular systolic pressure is 30.0 mmHg.  3. The mitral valve is normal in structure. No evidence of mitral valve regurgitation. No evidence of mitral stenosis.  4. The aortic valve is tricuspid. Aortic valve regurgitation is not visualized. No aortic stenosis is present.  5. The inferior vena cava is normal in size with greater than 50% respiratory variability, suggesting right atrial pressure of 3 mmHg.  6. Left pleural effusion noted. FINDINGS  Left Ventricle: Left ventricular ejection fraction, by estimation, is 65 to 70%. The left ventricle has normal function. The left ventricle has no regional wall motion abnormalities. The left ventricular internal cavity size was normal in size. There is  no left ventricular hypertrophy. Left ventricular diastolic parameters are consistent with Grade I diastolic dysfunction (impaired relaxation). Right Ventricle: The right ventricular size is normal. No increase in right ventricular wall thickness. Right ventricular systolic function is normal. There is normal pulmonary artery systolic pressure. The tricuspid regurgitant velocity is 2.66 m/s, and  with an assumed right atrial pressure of 3 mmHg, the estimated right ventricular systolic pressure is 92.3 mmHg. Left Atrium: Left atrial size was normal in size. Right Atrium: Right atrial size was normal in size. Pericardium: Left pleural effusion noted. There is no evidence of pericardial effusion. Mitral Valve: The mitral valve is normal in structure. Mild mitral annular calcification. No evidence of mitral valve regurgitation. No evidence of mitral valve stenosis. Tricuspid Valve: The tricuspid valve is normal in structure. Tricuspid  valve regurgitation is trivial. Aortic Valve: The aortic valve is tricuspid. Aortic valve regurgitation is not visualized. No aortic stenosis is present. Aortic valve peak gradient measures 12.5 mmHg. Pulmonic Valve: The pulmonic valve was normal in structure. Pulmonic valve regurgitation is not visualized. Aorta: The aortic root is normal in size and structure. Venous: The inferior vena cava is normal in size with greater than 50% respiratory variability, suggesting right atrial pressure of 3 mmHg. IAS/Shunts: No atrial level shunt detected by color flow Doppler.  LEFT VENTRICLE PLAX 2D LVIDd:         3.70 cm  Diastology LVIDs:         2.40 cm  LV e' medial:    5.00 cm/s LV PW:         0.90 cm  LV E/e' medial:  13.0 LV IVS:        1.00 cm  LV e' lateral:   6.09 cm/s LVOT diam:  1.80 cm  LV E/e' lateral: 10.7 LV SV:         75 LVOT Area:     2.54 cm  RIGHT VENTRICLE RV Basal diam:  2.70 cm RV S prime:     18.50 cm/s TAPSE (M-mode): 3.5 cm LEFT ATRIUM             RIGHT ATRIUM LA diam:        2.80 cm RA Area:     11.60 cm LA Vol (A2C):   36.9 ml RA Volume:   24.70 ml LA Vol (A4C):   29.7 ml LA Biplane Vol: 33.8 ml  AORTIC VALVE AV Area (Vmax): 1.93 cm AV Vmax:        177.00 cm/s AV Peak Grad:   12.5 mmHg LVOT Vmax:      134.00 cm/s LVOT Vmean:     92.200 cm/s LVOT VTI:       0.294 m  AORTA Ao Root diam: 2.70 cm MITRAL VALVE                TRICUSPID VALVE MV Area (PHT): 5.66 cm     TR Peak grad:   28.3 mmHg MV Decel Time: 134 msec     TR Vmax:        266.00 cm/s MV E velocity: 64.90 cm/s MV A velocity: 109.00 cm/s  SHUNTS MV E/A ratio:  0.60         Systemic VTI:  0.29 m                             Systemic Diam: 1.80 cm Loralie Champagne MD Electronically signed by Loralie Champagne MD Signature Date/Time: 02/28/2020/6:28:13 PM    Final    UE VENOUS DUPLEX ([place] & [facility] 7 am - 7 pm)  Result Date: 02/27/2020 UPPER VENOUS STUDY  Indications: Swelling of LT forearm/wrist/hand. Limitations: Depth and size of vessels.  Comparison Study: No prior studies available. Performing Technologist: Darlin Coco RDMS  Examination Guidelines: A complete evaluation includes B-mode imaging, spectral Doppler, color Doppler, and power Doppler as needed of all accessible portions of each vessel. Bilateral testing is considered an integral part of a complete examination. Limited examinations for reoccurring indications may be performed as noted.  Right Findings: +----------+------------+---------+-----------+----------+-------+ RIGHT     CompressiblePhasicitySpontaneousPropertiesSummary +----------+------------+---------+-----------+----------+-------+ Subclavian               Yes       Yes                      +----------+------------+---------+-----------+----------+-------+  Left Findings: +----------+------------+---------+-----------+----------+-------+ LEFT      CompressiblePhasicitySpontaneousPropertiesSummary +----------+------------+---------+-----------+----------+-------+ IJV           Full       Yes       Yes                      +----------+------------+---------+-----------+----------+-------+ Subclavian    Full       Yes       Yes                      +----------+------------+---------+-----------+----------+-------+ Axillary      Full       Yes       Yes                      +----------+------------+---------+-----------+----------+-------+ Brachial      Full  Yes       Yes                      +----------+------------+---------+-----------+----------+-------+ Radial        Full                                          +----------+------------+---------+-----------+----------+-------+ Ulnar         Full                                          +----------+------------+---------+-----------+----------+-------+ Cephalic      Full                                          +----------+------------+---------+-----------+----------+-------+ Basilic       Full                                           +----------+------------+---------+-----------+----------+-------+  Summary:  Right: No evidence of thrombosis in the subclavian.  Left: No evidence of deep vein thrombosis in the upper extremity. No evidence of superficial vein thrombosis in the upper extremity.  Indicental: Appearance of enlarged, hypoechoic lymph node with loss of hilar definition in the axilla.  *See table(s) above for measurements and observations.  Diagnosing physician: Harold Barban MD Electronically signed by Harold Barban MD on 02/27/2020 at 9:42:15 PM.    Final      LOS: 2 days   Antonieta Pert, MD [facility]  02/29/2020, 7:43 AM

## 2020-02-29 NOTE — Progress Notes (Signed)
Patient's last cbg was 55 at 4 and MD gave verbal order to give 50% dextrose 12.5g and RN will recheck cbg in 37mn. Recent blood sugar was 140 at 0823

## 2020-02-29 NOTE — Progress Notes (Addendum)
HEMATOLOGY-ONCOLOGY PROGRESS NOTE  SUBJECTIVE: Morgan Yu has no complaints today.  Family member is at the bedside.  She is leaving to go to her liver biopsy in IR very shortly.  Denies chest pain, shortness of breath, abdominal pain, nausea, vomiting.  Still has itching to her left breast.  REVIEW OF SYSTEMS:   Constitutional: Denies fevers, chills.  Positive for anorexia and weight loss. Eyes: Denies blurriness of vision Ears, nose, mouth, throat, and face: Denies mucositis or sore throat Respiratory: Denies cough, dyspnea or wheezes Cardiovascular: Denies palpitation, chest discomfort Gastrointestinal:  Denies nausea, heartburn or change in bowel habits Skin: Denies abnormal skin rashes Lymphatics: Denies new lymphadenopathy or easy bruising Neurological:Denies numbness, tingling or new weaknesses Behavioral/Psych: Mood is stable, no new changes  Extremities: No lower extremity edema Breast: Left breast mass with left arm swelling All other systems were reviewed with the patient and are negative.  I have reviewed the past medical history, past surgical history, social history and family history with the patient and they are unchanged from previous note.   PHYSICAL EXAMINATION: ECOG PERFORMANCE STATUS: 2 - Symptomatic, <50% confined to bed  Vitals:   02/29/20 0931 02/29/20 1235  BP: (!) 147/70 138/61  Pulse: 98 (!) 102  Resp: 18 20  Temp: 98.7 F (37.1 C) 98.8 F (37.1 C)  SpO2: 93% 94%   Filed Weights   02/28/20 1747  Weight: 39.4 kg    Intake/Output from previous day: 01/27 0701 - 01/28 0700 In: 240 [P.O.:240] Out: 120 [Urine:120]  General: Elderly female, no distress    Eyes:  no scleral icterus.   ENT:  There were no oropharyngeal lesions.   Lymphatics: Multiple palpable cervical lymph nodes, palpable left axillary lymph nodes BREAST: Left breast with erythema, skin involvement noted, palpable mass in the left breast occupying pretty much the entire breast,   Respiratory: lungs were clear bilaterally without wheezing or crackles.   Cardiovascular:  Regular rate and rhythm, S1/S2, without murmur, rub or gallop.  There was no pedal edema.   GI:  abdomen was soft, flat, nontender, nondistended, without organomegaly.   Skin exam was without echymosis, petichae.   Neuro exam was nonfocal. Patient was alert and oriented.  Attention was good. Language was appropriate.  Mood was normal without depression.  Speech was not pressured.  Thought content was not tangential.    LABORATORY DATA:  I have reviewed the data as listed CMP Latest Ref Rng & Units 02/29/2020 02/28/2020 02/27/2020  Glucose 70 - 99 mg/dL 63(L) 70 110(H)  BUN 8 - 23 mg/dL 32(H) 28(H) 31(H)  Creatinine 0.44 - 1.00 mg/dL 0.91 0.89 0.95  Sodium 135 - 145 mmol/L 136 134(L) 134(L)  Potassium 3.5 - 5.1 mmol/L 3.8 3.7 4.1  Chloride 98 - 111 mmol/L 98 97(L) 97(L)  CO2 22 - 32 mmol/L 21(L) 21(L) 21(L)  Calcium 8.9 - 10.3 mg/dL 11.1(H) 10.7(H) 11.1(H)  Total Protein 6.5 - 8.1 g/dL 5.7(L) 5.7(L) 6.4(L)  Total Bilirubin 0.3 - 1.2 mg/dL 1.4(H) 1.2 0.6  Alkaline Phos 38 - 126 U/L 583(H) 590(H) 652(H)  AST 15 - 41 U/L 143(H) 143(H) 154(H)  ALT 0 - 44 U/L 26 27 30     Lab Results  Component Value Date   WBC 8.4 02/29/2020   HGB 11.9 (L) 02/29/2020   HCT 37.7 02/29/2020   MCV 84.3 02/29/2020   PLT 362 02/29/2020    DG Chest 2 View  Result Date: 02/29/2020 CLINICAL DATA:  Pleural effusions, weakness EXAM: CHEST -  2 VIEW COMPARISON:  Radiograph 02/27/2020 FINDINGS: Normal cardiac silhouette. Small LEFT effusion. Lungs are hyperinflated. Chronic bronchitic markings. No pneumothorax. No focal consolidation. Severe degenerative changes in the glenoid fossa. IMPRESSION: 1. Small LEFT effusion. 2. Hyperinflated lungs and chronic bronchitic markings. Electronically Signed   By: Suzy Bouchard M.D.   On: 02/29/2020 12:38   CT Angio Chest PE W and/or Wo Contrast  Result Date: 02/27/2020 CLINICAL DATA:   Acute abdominal pain. Elevated liver function test. Suspected breast cancer. Query metastasis. Query shortness of breath. Pulmonary embolus suspected with high probability. EXAM: CT ANGIOGRAPHY CHEST CT ABDOMEN AND PELVIS WITH CONTRAST TECHNIQUE: Multidetector CT imaging of the chest was performed using the standard protocol during bolus administration of intravenous contrast. Multiplanar CT image reconstructions and MIPs were obtained to evaluate the vascular anatomy. Multidetector CT imaging of the abdomen and pelvis was performed using the standard protocol during bolus administration of intravenous contrast. CONTRAST:  136mL OMNIPAQUE IOHEXOL 350 MG/ML SOLN COMPARISON:  Chest radiograph 02/27/2020 FINDINGS: CTA CHEST FINDINGS Cardiovascular: Satisfactory opacification of the pulmonary arteries to the segmental level. No evidence of pulmonary embolism. Normal heart size. No pericardial effusion. Normal caliber thoracic aorta. No aortic dissection. Mediastinum/Nodes: Esophagus is decompressed. No significant lymphadenopathy in the mediastinum. Multiple enlarged lymph nodes in the left axilla, measuring up to 2.5 x 3.3 cm in diameter, likely metastatic. Lungs/Pleura: Moderate bilateral pleural effusions. Atelectasis in the lung bases with patchy airspace infiltrates in the remaining lungs. This could represent edema or pneumonia. Noncalcified nodule in the right middle lung measuring 9 mm in diameter, likely metastatic. Additional smaller nodules demonstrated in the right lower lung. Evaluation is limited due to motion artifact. Musculoskeletal: Diffuse skeletal metastasis with mixed sclerotic and lucent lesions throughout the visualized skeletal system. Review of the MIP images confirms the above findings. CT ABDOMEN and PELVIS FINDINGS Hepatobiliary: Multiple low-attenuation peripherally enhancing lesions demonstrated throughout the liver consistent with diffuse hepatic metastasis. Portal veins are patent.  Gallbladder and bile ducts are unremarkable. Pancreas: Unremarkable. No pancreatic ductal dilatation or surrounding inflammatory changes. Spleen: Normal in size without focal abnormality. Adrenals/Urinary Tract: Adrenal glands are unremarkable. Kidneys are normal, without renal calculi, focal lesion, or hydronephrosis. Bladder is unremarkable. Stomach/Bowel: Stomach is within normal limits. Appendix appears normal. No evidence of bowel wall thickening, distention, or inflammatory changes. Vascular/Lymphatic: Aortic atherosclerosis. No enlarged abdominal or pelvic lymph nodes. Reproductive: Uterus and bilateral adnexa are unremarkable. Other: Small amount of free fluid in the abdomen and pelvis, possibly reactive or may indicate peritoneal metastasis. Prominent upper abdominal varices. Edema in the subcutaneous fat throughout the abdominal wall. No free air. Musculoskeletal: Diffuse skeletal metastasis with mixed sclerotic and lucent lesions for about the visualized skeletal elements. Review of the MIP images confirms the above findings. IMPRESSION: 1. No evidence of significant pulmonary embolus. 2. Moderate bilateral pleural effusions with basilar atelectasis. Patchy airspace infiltrates in the remaining lungs may represent edema or pneumonia. 3. Noncalcified nodules in the right middle lung and right lower lung, likely metastatic. 4. Diffuse hepatic metastasis. 5. Diffuse skeletal metastasis. 6. Prominent upper abdominal varices. 7. Small amount of free fluid in the abdomen and pelvis, may be reactive or may indicate peritoneal metastasis. 8. Edema in the subcutaneous fat throughout the abdominal wall. Aortic Atherosclerosis (ICD10-I70.0). Electronically Signed   By: Lucienne Capers M.D.   On: 02/27/2020 19:40   MR Brain W Wo Contrast  Result Date: 02/28/2020 CLINICAL DATA:  Breast cancer, staging EXAM: MRI HEAD WITHOUT AND WITH CONTRAST TECHNIQUE:  Multiplanar, multiecho pulse sequences of the brain and  surrounding structures were obtained without and with intravenous contrast. CONTRAST:  34mL GADAVIST GADOBUTROL 1 MMOL/ML IV SOLN COMPARISON:  None. FINDINGS: Brain: There is no acute infarction or intracranial hemorrhage. There is no intracranial mass, mass effect, or edema. There is no hydrocephalus or extra-axial fluid collection. Prominence of the ventricles and sulci reflects minor generalized parenchymal volume loss. Patchy foci of T2 hyperintensity in the supratentorial white matter are nonspecific but probably reflect mild chronic microvascular ischemic changes. Punctate focus of susceptibility in the right temporal subcortical white matter likely reflects chronic microhemorrhage. No abnormal enhancement. Vascular: Major vessel flow voids at the skull base are preserved. Skull and upper cervical spine: Abnormal T1 marrow signal in the visualized cervical spine. Patchy abnormal marrow signal the calvarium and skull base. Sinuses/Orbits: Paranasal sinuses are aerated. Orbits are unremarkable. Other: Sella is unremarkable.  Mastoid air cells are clear. IMPRESSION: No evidence of intracranial metastatic disease. Diffuse osseous metastatic disease. Mild chronic microvascular ischemic changes. Electronically Signed   By: Macy Mis M.D.   On: 02/28/2020 13:57   CT ABDOMEN PELVIS W CONTRAST  Result Date: 02/27/2020 CLINICAL DATA:  Acute abdominal pain. Elevated liver function test. Suspected breast cancer. Query metastasis. Query shortness of breath. Pulmonary embolus suspected with high probability. EXAM: CT ANGIOGRAPHY CHEST CT ABDOMEN AND PELVIS WITH CONTRAST TECHNIQUE: Multidetector CT imaging of the chest was performed using the standard protocol during bolus administration of intravenous contrast. Multiplanar CT image reconstructions and MIPs were obtained to evaluate the vascular anatomy. Multidetector CT imaging of the abdomen and pelvis was performed using the standard protocol during bolus  administration of intravenous contrast. CONTRAST:  140mL OMNIPAQUE IOHEXOL 350 MG/ML SOLN COMPARISON:  Chest radiograph 02/27/2020 FINDINGS: CTA CHEST FINDINGS Cardiovascular: Satisfactory opacification of the pulmonary arteries to the segmental level. No evidence of pulmonary embolism. Normal heart size. No pericardial effusion. Normal caliber thoracic aorta. No aortic dissection. Mediastinum/Nodes: Esophagus is decompressed. No significant lymphadenopathy in the mediastinum. Multiple enlarged lymph nodes in the left axilla, measuring up to 2.5 x 3.3 cm in diameter, likely metastatic. Lungs/Pleura: Moderate bilateral pleural effusions. Atelectasis in the lung bases with patchy airspace infiltrates in the remaining lungs. This could represent edema or pneumonia. Noncalcified nodule in the right middle lung measuring 9 mm in diameter, likely metastatic. Additional smaller nodules demonstrated in the right lower lung. Evaluation is limited due to motion artifact. Musculoskeletal: Diffuse skeletal metastasis with mixed sclerotic and lucent lesions throughout the visualized skeletal system. Review of the MIP images confirms the above findings. CT ABDOMEN and PELVIS FINDINGS Hepatobiliary: Multiple low-attenuation peripherally enhancing lesions demonstrated throughout the liver consistent with diffuse hepatic metastasis. Portal veins are patent. Gallbladder and bile ducts are unremarkable. Pancreas: Unremarkable. No pancreatic ductal dilatation or surrounding inflammatory changes. Spleen: Normal in size without focal abnormality. Adrenals/Urinary Tract: Adrenal glands are unremarkable. Kidneys are normal, without renal calculi, focal lesion, or hydronephrosis. Bladder is unremarkable. Stomach/Bowel: Stomach is within normal limits. Appendix appears normal. No evidence of bowel wall thickening, distention, or inflammatory changes. Vascular/Lymphatic: Aortic atherosclerosis. No enlarged abdominal or pelvic lymph nodes.  Reproductive: Uterus and bilateral adnexa are unremarkable. Other: Small amount of free fluid in the abdomen and pelvis, possibly reactive or may indicate peritoneal metastasis. Prominent upper abdominal varices. Edema in the subcutaneous fat throughout the abdominal wall. No free air. Musculoskeletal: Diffuse skeletal metastasis with mixed sclerotic and lucent lesions for about the visualized skeletal elements. Review of the MIP images confirms  the above findings. IMPRESSION: 1. No evidence of significant pulmonary embolus. 2. Moderate bilateral pleural effusions with basilar atelectasis. Patchy airspace infiltrates in the remaining lungs may represent edema or pneumonia. 3. Noncalcified nodules in the right middle lung and right lower lung, likely metastatic. 4. Diffuse hepatic metastasis. 5. Diffuse skeletal metastasis. 6. Prominent upper abdominal varices. 7. Small amount of free fluid in the abdomen and pelvis, may be reactive or may indicate peritoneal metastasis. 8. Edema in the subcutaneous fat throughout the abdominal wall. Aortic Atherosclerosis (ICD10-I70.0). Electronically Signed   By: Lucienne Capers M.D.   On: 02/27/2020 19:40   DG Chest Portable 1 View  Result Date: 02/27/2020 CLINICAL DATA:  Shortness of breath EXAM: PORTABLE CHEST 1 VIEW COMPARISON:  None. FINDINGS: There is hyperinflation of the lungs compatible with COPD. Small left pleural effusion with left base atelectasis. Right lung clear. No acute bony abnormality. Multiple old healed right rib fractures. IMPRESSION: Small left pleural effusion with left base atelectasis. COPD. Electronically Signed   By: Rolm Baptise M.D.   On: 02/27/2020 17:56   ECHOCARDIOGRAM COMPLETE  Result Date: 02/28/2020    ECHOCARDIOGRAM REPORT   Patient Name:   Morgan Yu Date of Exam: 02/28/2020 Medical Rec #:  [id number]      Height: Accession #:    [id number]     Weight: Date of Birth:  07-16-1940       BSA: Patient Age:    5 years       BP:            124/78 mmHg Patient Gender: F              HR:           100 bpm. Exam Location:  Inpatient Procedure: 2D Echo, Cardiac Doppler and Color Doppler Indications:    Dyspnea  History:        Patient has no prior history of Echocardiogram examinations.                 Signs/Symptoms:Dyspnea. New metastatic breast cancer, bilateral                 pleural effusions.  Sonographer:    Dustin Flock Referring Phys: [phone] [place]  1. Left ventricular ejection fraction, by estimation, is 65 to 70%. The left ventricle has normal function. The left ventricle has no regional wall motion abnormalities. Left ventricular diastolic parameters are consistent with Grade I diastolic dysfunction (impaired relaxation).  2. Right ventricular systolic function is normal. The right ventricular size is normal. There is normal pulmonary artery systolic pressure. The estimated right ventricular systolic pressure is 40.3 mmHg.  3. The mitral valve is normal in structure. No evidence of mitral valve regurgitation. No evidence of mitral stenosis.  4. The aortic valve is tricuspid. Aortic valve regurgitation is not visualized. No aortic stenosis is present.  5. The inferior vena cava is normal in size with greater than 50% respiratory variability, suggesting right atrial pressure of 3 mmHg.  6. Left pleural effusion noted. FINDINGS  Left Ventricle: Left ventricular ejection fraction, by estimation, is 65 to 70%. The left ventricle has normal function. The left ventricle has no regional wall motion abnormalities. The left ventricular internal cavity size was normal in size. There is  no left ventricular hypertrophy. Left ventricular diastolic parameters are consistent with Grade I diastolic dysfunction (impaired relaxation). Right Ventricle: The right ventricular size is normal. No increase in right ventricular wall thickness. Right  ventricular systolic function is normal. There is normal pulmonary artery systolic pressure.  The tricuspid regurgitant velocity is 2.66 m/s, and  with an assumed right atrial pressure of 3 mmHg, the estimated right ventricular systolic pressure is 32.6 mmHg. Left Atrium: Left atrial size was normal in size. Right Atrium: Right atrial size was normal in size. Pericardium: Left pleural effusion noted. There is no evidence of pericardial effusion. Mitral Valve: The mitral valve is normal in structure. Mild mitral annular calcification. No evidence of mitral valve regurgitation. No evidence of mitral valve stenosis. Tricuspid Valve: The tricuspid valve is normal in structure. Tricuspid valve regurgitation is trivial. Aortic Valve: The aortic valve is tricuspid. Aortic valve regurgitation is not visualized. No aortic stenosis is present. Aortic valve peak gradient measures 12.5 mmHg. Pulmonic Valve: The pulmonic valve was normal in structure. Pulmonic valve regurgitation is not visualized. Aorta: The aortic root is normal in size and structure. Venous: The inferior vena cava is normal in size with greater than 50% respiratory variability, suggesting right atrial pressure of 3 mmHg. IAS/Shunts: No atrial level shunt detected by color flow Doppler.  LEFT VENTRICLE PLAX 2D LVIDd:         3.70 cm  Diastology LVIDs:         2.40 cm  LV e' medial:    5.00 cm/s LV PW:         0.90 cm  LV E/e' medial:  13.0 LV IVS:        1.00 cm  LV e' lateral:   6.09 cm/s LVOT diam:     1.80 cm  LV E/e' lateral: 10.7 LV SV:         75 LVOT Area:     2.54 cm  RIGHT VENTRICLE RV Basal diam:  2.70 cm RV S prime:     18.50 cm/s TAPSE (M-mode): 3.5 cm LEFT ATRIUM             RIGHT ATRIUM LA diam:        2.80 cm RA Area:     11.60 cm LA Vol (A2C):   36.9 ml RA Volume:   24.70 ml LA Vol (A4C):   29.7 ml LA Biplane Vol: 33.8 ml  AORTIC VALVE AV Area (Vmax): 1.93 cm AV Vmax:        177.00 cm/s AV Peak Grad:   12.5 mmHg LVOT Vmax:      134.00 cm/s LVOT Vmean:     92.200 cm/s LVOT VTI:       0.294 m  AORTA Ao Root diam: 2.70 cm MITRAL VALVE                 TRICUSPID VALVE MV Area (PHT): 5.66 cm     TR Peak grad:   28.3 mmHg MV Decel Time: 134 msec     TR Vmax:        266.00 cm/s MV E velocity: 64.90 cm/s MV A velocity: 109.00 cm/s  SHUNTS MV E/A ratio:  0.60         Systemic VTI:  0.29 m                             Systemic Diam: 1.80 cm Loralie Champagne MD Electronically signed by Loralie Champagne MD Signature Date/Time: 02/28/2020/6:28:13 PM    Final    UE VENOUS DUPLEX ([facility] 7 am - 7 pm)  Result Date: 02/27/2020 UPPER VENOUS STUDY  Indications: Swelling of  LT forearm/wrist/hand. Limitations: Depth and size of vessels. Comparison Study: No prior studies available. Performing Technologist: Darlin Coco RDMS  Examination Guidelines: A complete evaluation includes B-mode imaging, spectral Doppler, color Doppler, and power Doppler as needed of all accessible portions of each vessel. Bilateral testing is considered an integral part of a complete examination. Limited examinations for reoccurring indications may be performed as noted.  Right Findings: +----------+------------+---------+-----------+----------+-------+ RIGHT     CompressiblePhasicitySpontaneousPropertiesSummary +----------+------------+---------+-----------+----------+-------+ Subclavian               Yes       Yes                      +----------+------------+---------+-----------+----------+-------+  Left Findings: +----------+------------+---------+-----------+----------+-------+ LEFT      CompressiblePhasicitySpontaneousPropertiesSummary +----------+------------+---------+-----------+----------+-------+ IJV           Full       Yes       Yes                      +----------+------------+---------+-----------+----------+-------+ Subclavian    Full       Yes       Yes                      +----------+------------+---------+-----------+----------+-------+ Axillary      Full       Yes       Yes                       +----------+------------+---------+-----------+----------+-------+ Brachial      Full       Yes       Yes                      +----------+------------+---------+-----------+----------+-------+ Radial        Full                                          +----------+------------+---------+-----------+----------+-------+ Ulnar         Full                                          +----------+------------+---------+-----------+----------+-------+ Cephalic      Full                                          +----------+------------+---------+-----------+----------+-------+ Basilic       Full                                          +----------+------------+---------+-----------+----------+-------+  Summary:  Right: No evidence of thrombosis in the subclavian.  Left: No evidence of deep vein thrombosis in the upper extremity. No evidence of superficial vein thrombosis in the upper extremity.  Indicental: Appearance of enlarged, hypoechoic lymph node with loss of hilar definition in the axilla.  *See table(s) above for measurements and observations.  Diagnosing physician: Harold Barban MD Electronically signed by Harold Barban MD on 02/27/2020 at 9:42:15 PM.    Final     ASSESSMENT AND PLAN: Palpable left breast mass  with axillary and cervical lymphadenopathy with abnormal CT scans concerning for pulmonary nodules, liver masses, and bone lesions -Discussed CT scan findings with the patient and her family member which are concerning for metastatic breast cancer. -We discussed that we need to confirm the diagnosis with a biopsy. Recommend ultrasound-guided biopsy of one of her liver lesions.  Liver biopsy to be performed today. -We discussed that there are different types of breast cancer and treatment would depend on the biopsy results. -We discussed that metastatic breast cancer is not curable but is treatable. -Further recommendations regarding treatment pending biopsy  results. -MRI of the brain did not show any evidence of metastatic disease.   Hypercalcemia likely due to malignancy -Corrected calcium remains elevated. -Status post pamidronate 02/28/2020. -Anticipate that will take a few days for her calcium level to continue to decline. -Recommend IV hydration and Lasix to help reduce calcium level. -Calcium level needs to be decreasing before discharge can be considered.    LOS: 2 days   Mikey Bussing, DNP, AGPCNP-BC, AOCNP 02/29/20  Attending Note  I personally saw the patient, reviewed the chart and examined the patient. The plan of care was discussed with the patient and the admitting team. I agree with the assessment and plan as documented above. Thank you very much for the consultation. Saw the patient, agree with the above mentioned plan.

## 2020-03-01 DIAGNOSIS — R7989 Other specified abnormal findings of blood chemistry: Secondary | ICD-10-CM

## 2020-03-01 LAB — COMPREHENSIVE METABOLIC PANEL
ALT: 49 U/L — ABNORMAL HIGH (ref 0–44)
AST: 252 U/L — ABNORMAL HIGH (ref 15–41)
Albumin: 2.1 g/dL — ABNORMAL LOW (ref 3.5–5.0)
Alkaline Phosphatase: 534 U/L — ABNORMAL HIGH (ref 38–126)
Anion gap: 15 (ref 5–15)
BUN: 25 mg/dL — ABNORMAL HIGH (ref 8–23)
CO2: 21 mmol/L — ABNORMAL LOW (ref 22–32)
Calcium: 9.6 mg/dL (ref 8.9–10.3)
Chloride: 103 mmol/L (ref 98–111)
Creatinine, Ser: 0.75 mg/dL (ref 0.44–1.00)
GFR, Estimated: >60 mL/min (ref >60)
Glucose, Bld: 127 mg/dL — ABNORMAL HIGH (ref 70–99)
Potassium: 3.3 mmol/L — ABNORMAL LOW (ref 3.5–5.1)
Sodium: 139 mmol/L (ref 135–145)
Total Bilirubin: 0.8 mg/dL (ref 0.3–1.2)
Total Protein: 5.3 g/dL — ABNORMAL LOW (ref 6.5–8.1)

## 2020-03-01 LAB — CBC
HCT: 38.6 % (ref 36.0–46.0)
Hemoglobin: 13 g/dL (ref 12.0–15.0)
MCH: 27.7 pg (ref 26.0–34.0)
MCHC: 33.7 g/dL (ref 30.0–36.0)
MCV: 82.1 fL (ref 80.0–100.0)
Platelets: 316 10*3/uL (ref 150–400)
RBC: 4.7 MIL/uL (ref 3.87–5.11)
RDW: 20.9 % — ABNORMAL HIGH (ref 11.5–15.5)
WBC: 7 10*3/uL (ref 4.0–10.5)
nRBC: 0 % (ref 0.0–0.2)

## 2020-03-01 MED ORDER — POTASSIUM CHLORIDE CRYS ER 20 MEQ PO TBCR
40.0000 meq | EXTENDED_RELEASE_TABLET | Freq: Once | ORAL | Status: AC
  Administered 2020-03-01: 40 meq via ORAL
  Filled 2020-03-01: qty 2

## 2020-03-01 NOTE — Progress Notes (Signed)
Hypercalcemia has improved. Oncology will arrange FU outpatient to discuss pathology and treatment recommendations. Discharge predisposition per primary team

## 2020-03-01 NOTE — TOC Progression Note (Addendum)
Transition of Care [facility]) - Progression Note    Patient Details  Name: Morgan Yu MRN: [id number] Date of Birth: Nov 28, 1940  Transition of Care [facility]) CM/SW [place], [place] Phone Number: 03/01/2020, 4:28 PM  Clinical Narrative:     CSW was consulted by Gregary Signs from [facility] concerning pt living alone.CSW met with pt and Gayle at bedside to discuss SNF vs HH.  CSW introduced self and role.  Pt is agreeable for CSW to send out in hub for possible placement.  Pt is not sure if she wants to go to a SNF.  Pt and Edd Fabian would like to be informed of possible SNF's.  TOC team will continue to assist with disposition planning.   Expected Discharge Plan: [place] Barriers to Discharge: Continued Medical Work up  Expected Discharge Plan and Services Expected Discharge Plan: [place] In-house Referral: Hospice / Palliative Care Discharge Planning Services: CM Consult   Living arrangements for the past 2 months: Single Family Home                                       Social Determinants of Health (SDOH) Interventions    Readmission Risk Interventions No flowsheet data found.

## 2020-03-01 NOTE — Progress Notes (Signed)
Daily Progress Note   Patient Name: Morgan Yu       Date: 03/01/2020 DOB: 02-15-1940  Age: 80 y.o. MRN#: [id number] Attending Physician: Morgan Pert, MD Primary Care Physician: Patient, No Pcp Per Admit Date: 02/27/2020  Reason for Follow-up: continued [place] discussion, disposition  Subjective: 15:08--Spoke with Morgan Yu by phone (returning her call from earlier). She expresses concern that patient was possibly going to be discharged today with a plan of home health with PT. Patient lives alone and does not have anyone to stay with her 24/7. Morgan Yu expresses concern that patient is still very weak and will not be safe staying at home alone.  Reviewed PT notes with Morgan Yu and let her know that patient had told PT that she had "good support at home".   I offered to reach out to the social worker to further discuss disposition options - Morgan Yu expresses appreciation.  I spoke with CSW Morgan Yu and notified her of Morgan Yu's concerns; she states she will follow-up with Morgan Yu at bedside.   17:45--I met with patient and Morgan Yu at bedside. Morgan Yu has been provided with a list of skilled nursing facilities. Patient expresses she would rather go home, but is deferring to Morgan Yu for what she thinks is best. Discussed that SNF for rehab would only be temporary and would hopefully help improve her strength and functional status. It would also buy time for Morgan Yu to hopefull obtain private caregiver services to support patient safely living at home.   I offered and explained the option for an outpatient palliative referral to check-in  with patient and family and continue goals of care discussions. Morgan Yu and patient are agreeable to this referral.   Discussed need to follow up outpatient with oncology for pathology results and treatment  plan. If patient decides to pursue treatment, she can continue outpatient palliative. If she decides not to pursue treatment, she would be eligible for hospice. Emphasized that home hospice is intermittent care, and would recommend private caregivers to help supplement.   Length of Stay: 3  Current Medications: Scheduled Meds:  . furosemide  20 mg Intravenous Daily  . mirtazapine  7.5 mg Oral QHS  . sodium chloride flush  3 mL Intravenous Q12H    Continuous Infusions: . sodium chloride    . dextrose 5 % and 0.9% NaCl 10  mL/hr at 03/01/20 1618    PRN Meds: sodium chloride, acetaminophen **OR** acetaminophen, ondansetron **OR** ondansetron (ZOFRAN) IV, polyethylene glycol, traMADol  Physical Exam Vitals reviewed.  Constitutional:      General: She is not in acute distress.    Appearance: She is ill-appearing.     Comments: Very thin and frail  Pulmonary:     Effort: Pulmonary effort is normal.  Neurological:     Mental Status: She is alert and oriented to person, place, and time.     Motor: Weakness present.             Vital Signs: BP 130/62 (BP Location: Left Arm)   Pulse 96   Temp 98.7 F (37.1 C) (Oral)   Resp 18   Wt 39.4 kg   SpO2 92%  SpO2: SpO2: 92 % O2 Device: O2 Device: Room Air O2 Flow Rate: O2 Flow Rate (L/min): 2 L/min  Intake/output summary:   Intake/Output Summary (Last 24 hours) at 03/01/2020 1626 Last data filed at 03/01/2020 1600 Gross per 24 hour  Intake 1668.28 ml  Output --  Net 1668.28 ml   LBM: Last BM Date: 02/25/20 Baseline Weight: Weight: 39.4 kg Most recent weight: Weight: 39.4 kg       Palliative Assessment/Data: PPS 30-40%     Palliative Care Assessment & Plan   HPI/Patient Profile: 80 y.o. female  with no known medical history though has not had medical care in many years who presented to the emergency department on 02/27/2020 with left breast mass and left arm swelling. Patient reports developing pruritus and a lump in the  left breast a few months ago, then developed some swelling of the left arm over the past 2 weeks. ED Course: Stable vital signs. Labs significant for alkaline phosphatase of 652, albumin 2.6, and AST 154. CBC unremarkable. High-sensitivity troponin is 18 (twice). BNP normal. Ultrasound of the left upper extremity is negative for DVT nut notable for enlarged axillary lymph node. CTA chest is negative for PE but shows moderate bilateral pleural effusions, patchy airspace opacities, and right middle and right lower lobe nodules concerning for metastatic disease. CT abdomen shows diffuse hepatic and skeletal metastases.   Assessment: - left palpable breast mass, axillary and cervical lymphadenopathy - pulmonary nodules, liver masses, and bone lesions concerning for metastatic disease - hypercalcemia of malignancy -dehydration - hypoglycemia - elevated LFTs - generalized weakness  Recommendations/Plan: DNR/DNI as previously documented Continue current medical care Patient to follow up outpatient with oncology for pathology results and treatment plan Continue mirtazapine 7.5 mg at bedtime (for sleep and anorexia) Outpatient palliative referral - preference is [facility] will continue to follow  Goals of Care and Additional Recommendations: Limitations on Scope of Treatment: Full Scope Treatment  Code Status: DNR/DNI  Prognosis:  < 6 months  Discharge Planning: [place] for rehab with Palliative care service follow-up (most likely)  Care plan was discussed with CSW  Thank you for allowing the Palliative Medicine Team to assist in the care of this patient.   Total Time 35 minutes Prolonged Time Billed  no       Greater than 50%  of this time was spent counseling and coordinating care related to the above assessment and plan.  Morgan Bullion, NP  Please contact Palliative Medicine Team phone at [phone] for questions and concerns.

## 2020-03-01 NOTE — Progress Notes (Signed)
PROGRESS NOTE    Morgan Yu  RWE:[id number] DOB: Jan 26, 1941 DOA: 02/27/2020 PCP: Patient, No Pcp Per   Chief Complaint  Patient presents with  . Weakness    Brief Narrative: 80 year old female no significant past medical history but has not seen a PCP in many years presented to the ED with left breast mass and left arm swelling  As per report-she notes that she developed pruritus and later a lump involving the left breast a few months ago and then developed some swelling of the left arm over the past 2 weeks.  She has had 1 mammogram which she states was many years ago.  Never had a colonoscopy.  She denies any chest pain, fevers, chills, leg swelling, cough, or shortness of breath.  ED Course:Upon arrival to the ED, patient is found to be afebrile, saturating well on room air, and tachycardic to 110 with stable blood pressure.  EKG features sinus tachycardia and PAC.  Chemistry panel notable for alkaline phosphatase of 652, albumin 2.6, and AST 154.  CBC is unremarkable.  High-sensitivity troponin is 18 twice.  BNP is normal.  CTA chest is negative for PE but notable for moderate bilateral pleural effusions, patchy airspace opacities, right middle and right lower lobe nodules concerning for metastatic disease, as well as diffuse hepatic and skeletal metastases.  Ultrasound of the left upper extremity is negative for DVT but notable for enlarged axillary node.  Patient was given 20 mg IV Lasix in the ED.  Seen by hematology oncology noted to have hypercalcemia of malignancy.  Underwent left axillary biopsy 1/28. Pt calcium is much better after  Pamidronate. Doing well, off o2.Oncology PT,OT has seen and advised HHPT,3 IN 1.  Subjective: Alert, not in pain.  Resting comfortably in the bedside chair.Has come off oxygen. Patient had biopsy yesterday of the lymph nodes.    Assessment & Plan:  Metastatic disease w/ palpable left breast mass, axillary and cervical lymphadenopathy and CT scan  with pulmonary nodules, liver masses and bone lesions concerning for metastatic disease: S/p left axillary lymph node biopsy by IR 1/28.MRI negative for brain mets.  Follow-up biopsy result and follow-up with oncology likley outpatient for further plan.  Hypercalcemia of malignancy : calcium much better, corrected ca at 11, s/p pamidronate on 1/27.    Hypokalemia: Potassium supplementation added.  Mild metabolic acidosis likely from liver mets dehydration.  Encourage oral hydration.  Hypoglycemia CBG 63>55 , likely from n.p.o.Resolved. Cont po diet. Dehydration with elevated BUN continue IV fluids and Lasix. Elevated LFTs from liver metastasis.  LFTs are up outpatient follow-up advised Hyponatremia: improved. Moderate bilateral pleural effusion with basilar atelectasis on Lasix.  Pleural effusion much better with Lasix.  At this time asymptomatic.  Acute pulmonary insufficiency patient was needing 2 L nasal cannula to maintain saturation, and has been weaned off. Elevated troponin subtle increase no chest pain.  Echo shows EF 65 to 70% no regional WMA likely demand mismatch from pleural effusion Sciatica on rt leg- cvhronic Has not seen doctor in many years. Deconditioning in the setting of multiple comorbidities. Goals of care palliative care is following closely.  Status changed to DNR after discussion with patient and POA.  Overall prognosis remains poor pending biopsy result WILL NEED OP PALLIATIVE CARE F/U AND ONCOLOGY FU TO DISCUSS MORE.  Consult dietitian to assess nutritional status and to augment diet  Nutrition: Diet Order            Diet regular Room service appropriate? Yes;  Fluid consistency: Thin  Diet effective now                 There is no height or weight on file to calculate BMI.  DVT prophylaxis: SCDs Start: 02/27/20 2048 Code Status:   Code Status: DNR  Family Communication: plan of care was discussed with patient. Cousin updated by RN Status is:  Inpatient Remains inpatient appropriate because:Inpatient level of care appropriate due to severity of illness Dispo: The patient is from: Home              Anticipated d/c is to: HHPT               Anticipated d/c date is: 1 day              Patient currently is not medically stable to d/c.   Difficult to place patient No  Consultants:see note  Procedures:see note  Culture/Microbiology No results found for: SDES, SPECREQUEST, CULT, REPTSTATUS  Other culture-see note  Medications: Scheduled Meds: . furosemide  20 mg Intravenous Daily  . mirtazapine  7.5 mg Oral QHS  . potassium chloride  40 mEq Oral Once  . sodium chloride flush  3 mL Intravenous Q12H   Continuous Infusions: . sodium chloride    . dextrose 5 % and 0.9% NaCl 75 mL/hr at 02/29/20 2128   Antimicrobials: Anti-infectives (From admission, onward)   None     Objective: Vitals: Today's Vitals   02/29/20 2106 02/29/20 2217 03/01/20 0513 03/01/20 0924  BP: (!) 158/64  (!) 119/52 130/62  Pulse: (!) 109  91 96  Resp: 18  18 18   Temp: 99.8 F (37.7 C)  98.9 F (37.2 C) 98.7 F (37.1 C)  TempSrc: Oral  Oral Oral  SpO2: 91%  93% 92%  Weight:      PainSc:  Asleep      Intake/Output Summary (Last 24 hours) at 03/01/2020 1312 Last data filed at 03/01/2020 0601 Gross per 24 hour  Intake 1525.82 ml  Output --  Net 1525.82 ml   Filed Weights   02/28/20 1747  Weight: 39.4 kg   Weight change:   Intake/Output from previous day: 01/28 0701 - 01/29 0700 In: 1857.1 [P.O.:118; I.V.:1739.1] Out: -  Intake/Output this shift: No intake/output data recorded. Filed Weights   02/28/20 1747  Weight: 39.4 kg    Examination: General exam: AAOX3, ill thin looking and frail looking HEENT:Oral mucosa moist, Ear/Nose WNL grossly, dentition normal. Respiratory system: bilaterally CLEAR,no wheezing or crackles,no use of accessory muscle Cardiovascular system: S1 & S2 +, No JVD. Gastrointestinal system: Abdomen soft,  NT,ND, BS+ Nervous System:Alert, awake, moving extremities and grossly nonfocal Extremities: No edema, distal peripheral pulses palpable.  Skin: No rashes,no icterus. Left breast mass. MSK: thin  muscle bulk,tone, power  Data Reviewed: I have personally reviewed following labs and imaging studies CBC: Recent Labs  Lab 02/27/20 1537 02/28/20 0443 02/29/20 0515 03/01/20 0323  WBC 9.4 9.2 8.4 7.0  HGB 13.5 12.6 11.9* 13.0  HCT 40.9 38.0 37.7 38.6  MCV 83.1 83.3 84.3 82.1  PLT 394 349 362 161   Basic Metabolic Panel: Recent Labs  Lab 02/27/20 1537 02/28/20 0443 02/29/20 0515 03/01/20 0323  NA 134* 134* 136 139  K 4.1 3.7 3.8 3.3*  CL 97* 97* 98 103  CO2 21* 21* 21* 21*  GLUCOSE 110* 70 63* 127*  BUN 31* 28* 32* 25*  CREATININE 0.95 0.89 0.91 0.75  CALCIUM 11.1* 10.7* 11.1* 9.6  GFR: CrCl cannot be calculated (Unknown ideal weight.). Liver Function Tests: Recent Labs  Lab 02/27/20 1721 02/28/20 0443 02/29/20 0515 03/01/20 0323  AST 154* 143* 143* 252*  ALT 30 27 26  49*  ALKPHOS 652* 590* 583* 534*  BILITOT 0.6 1.2 1.4* 0.8  PROT 6.4* 5.7* 5.7* 5.3*  ALBUMIN 2.6* 2.2* 2.3* 2.1*   No results for input(s): LIPASE, AMYLASE in the last 168 hours. No results for input(s): AMMONIA in the last 168 hours. Coagulation Profile: Recent Labs  Lab 02/29/20 0515  INR 1.0   Cardiac Enzymes: No results for input(s): CKTOTAL, CKMB, CKMBINDEX, TROPONINI in the last 168 hours. BNP (last 3 results) No results for input(s): PROBNP in the last 8760 hours. HbA1C: No results for input(s): HGBA1C in the last 72 hours. CBG: Recent Labs  Lab 02/29/20 0749 02/29/20 0823 02/29/20 1544  GLUCAP 55* 140* 91   Lipid Profile: No results for input(s): CHOL, HDL, LDLCALC, TRIG, CHOLHDL, LDLDIRECT in the last 72 hours. Thyroid Function Tests: Recent Labs    02/27/20 1721  TSH 3.188   Anemia Panel: No results for input(s): VITAMINB12, FOLATE, FERRITIN, TIBC, IRON, RETICCTPCT  in the last 72 hours. Sepsis Labs: No results for input(s): PROCALCITON, LATICACIDVEN in the last 168 hours.  Recent Results (from the past 240 hour(s))  SARS Coronavirus 2 by RT PCR (hospital order, performed in [facility] hospital lab) Nasopharyngeal Nasopharyngeal Swab     Status: None   Collection Time: 02/27/20  9:07 PM   Specimen: Nasopharyngeal Swab  Result Value Ref Range Status   SARS Coronavirus 2 NEGATIVE NEGATIVE Final    Comment: (NOTE) SARS-CoV-2 target nucleic acids are NOT DETECTED.  The SARS-CoV-2 RNA is generally detectable in upper and lower respiratory specimens during the acute phase of infection. The lowest concentration of SARS-CoV-2 viral copies this assay can detect is 250 copies / mL. A negative result does not preclude SARS-CoV-2 infection and should not be used as the sole basis for treatment or other patient management decisions.  A negative result may occur with improper specimen collection / handling, submission of specimen other than nasopharyngeal swab, presence of viral mutation(s) within the areas targeted by this assay, and inadequate number of viral copies (<250 copies / mL). A negative result must be combined with clinical observations, patient history, and epidemiological information.  Fact Sheet for Patients:   StrictlyIdeas.no  Fact Sheet for Healthcare Providers: BankingDealers.co.za  This test is not yet approved or  cleared by the [place] FDA and has been authorized for detection and/or diagnosis of SARS-CoV-2 by FDA under an Emergency Use Authorization (EUA).  This EUA will remain in effect (meaning this test can be used) for the duration of the COVID-19 declaration under Section 564(b)(1) of the Act, 21 U.S.C. section 360bbb-3(b)(1), unless the authorization is terminated or revoked sooner.  Performed at [facility], [place] [address] [place], [place] [address]       Radiology Studies: DG Chest 2 View  Result Date: 02/29/2020 CLINICAL DATA:  Pleural effusions, weakness EXAM: CHEST - 2 VIEW COMPARISON:  Radiograph 02/27/2020 FINDINGS: Normal cardiac silhouette. Small LEFT effusion. Lungs are hyperinflated. Chronic bronchitic markings. No pneumothorax. No focal consolidation. Severe degenerative changes in the glenoid fossa. IMPRESSION: 1. Small LEFT effusion. 2. Hyperinflated lungs and chronic bronchitic markings. Electronically Signed   By: Suzy Bouchard M.D.   On: 02/29/2020 12:38   MR Brain W Wo Contrast  Result Date: 02/28/2020 CLINICAL DATA:  Breast cancer, staging EXAM:  MRI HEAD WITHOUT AND WITH CONTRAST TECHNIQUE: Multiplanar, multiecho pulse sequences of the brain and surrounding structures were obtained without and with intravenous contrast. CONTRAST:  35mL GADAVIST GADOBUTROL 1 MMOL/ML IV SOLN COMPARISON:  None. FINDINGS: Brain: There is no acute infarction or intracranial hemorrhage. There is no intracranial mass, mass effect, or edema. There is no hydrocephalus or extra-axial fluid collection. Prominence of the ventricles and sulci reflects minor generalized parenchymal volume loss. Patchy foci of T2 hyperintensity in the supratentorial white matter are nonspecific but probably reflect mild chronic microvascular ischemic changes. Punctate focus of susceptibility in the right temporal subcortical white matter likely reflects chronic microhemorrhage. No abnormal enhancement. Vascular: Major vessel flow voids at the skull base are preserved. Skull and upper cervical spine: Abnormal T1 marrow signal in the visualized cervical spine. Patchy abnormal marrow signal the calvarium and skull base. Sinuses/Orbits: Paranasal sinuses are aerated. Orbits are unremarkable. Other: Sella is unremarkable.  Mastoid air cells are clear. IMPRESSION: No evidence of intracranial metastatic disease. Diffuse osseous metastatic disease. Mild chronic microvascular ischemic changes.  Electronically Signed   By: Macy Mis M.D.   On: 02/28/2020 13:57   [place] CORE BIOPSY (LYMPH NODES)  Result Date: 02/29/2020 INDICATION: 80 year old female with a history of breast carcinoma with metastatic disease. EXAM: IMAGE GUIDED BIOPSY OF LEFT CHEST WALL MASS/AXILLARY LYMPH NODE MEDICATIONS: None. ANESTHESIA/SEDATION: Moderate (conscious) sedation was employed during this procedure. A total of Versed 0.5 mg and Fentanyl 25 mcg was administered intravenously. Moderate Sedation Time: 10 minutes. The patient's level of consciousness and vital signs were monitored continuously by radiology nursing throughout the procedure under my direct supervision. FLUOROSCOPY TIME:  Ultrasound COMPLICATIONS: None PROCEDURE: Informed written consent was obtained from the patient after a thorough discussion of the procedural risks, benefits and alternatives. All questions were addressed. Maximal Sterile Barrier Technique was utilized including caps, mask, sterile gowns, sterile gloves, sterile drape, hand hygiene and skin antiseptic. A timeout was performed prior to the initiation of the procedure Ultrasound survey was performed with images stored and sent to PACs. The left axillary region was prepped with chlorhexidine in a sterile fashion, and a sterile drape was applied covering the operative field. A sterile gown and sterile gloves were used for the procedure. Local anesthesia was provided with 1% Lidocaine. Ultrasound guidance was used to infiltrate the region with 1% lidocaine for local anesthesia. Four separate 16 gauge core biopsy were then acquired of the left axillary mass/lymph node using ultrasound guidance. Images were stored. Final image was stored after biopsy. Patient tolerated the procedure well and remained hemodynamically stable throughout. No complications were encountered and no significant blood loss was encounter IMPRESSION: Status post ultrasound-guided left axillary mass biopsy. Signed, Dulcy Fanny.  Dellia Nims, RPVI [facility] Radiology Electronically Signed   By: Corrie Mckusick D.O.   On: 02/29/2020 16:40     LOS: 3 days   Antonieta Pert, MD [facility]  03/01/2020, 1:12 PM

## 2020-03-02 MED ORDER — MIRTAZAPINE 15 MG PO TABS
15.0000 mg | ORAL_TABLET | Freq: Every day | ORAL | Status: DC
  Administered 2020-03-02 – 2020-03-04 (×3): 15 mg via ORAL
  Filled 2020-03-02 (×3): qty 1

## 2020-03-02 NOTE — Progress Notes (Signed)
PROGRESS NOTE    Morgan Yu  BWG:[id number] DOB: 1940-05-17 DOA: 02/27/2020 PCP: Patient, No Pcp Per   Chief Complaint  Patient presents with  . Weakness    Brief Narrative: 80 year old female no significant past medical history but has not seen a PCP in many years presented to the ED with left breast mass and left arm swelling  As per report-she notes that she developed pruritus and later a lump involving the left breast a few months ago and then developed some swelling of the left arm over the past 2 weeks.  She has had 1 mammogram which she states was many years ago.  Never had a colonoscopy.  She denies any chest pain, fevers, chills, leg swelling, cough, or shortness of breath.  ED Course:Upon arrival to the ED, patient is found to be afebrile, saturating well on room air, and tachycardic to 110 with stable blood pressure.  EKG features sinus tachycardia and PAC.  Chemistry panel notable for alkaline phosphatase of 652, albumin 2.6, and AST 154.  CBC is unremarkable.  High-sensitivity troponin is 18 twice.  BNP is normal.  CTA chest is negative for PE but notable for moderate bilateral pleural effusions, patchy airspace opacities, right middle and right lower lobe nodules concerning for metastatic disease, as well as diffuse hepatic and skeletal metastases.  Ultrasound of the left upper extremity is negative for DVT but notable for enlarged axillary node.  Patient was given 20 mg IV Lasix in the ED.  Seen by hematology oncology noted to have hypercalcemia of malignancy.  Underwent left axillary biopsy 1/28. Pt calcium is much better after  Pamidronate. Doing well, off o2.Oncology PT,OT has seen and advised HHPT,3 IN 1. Patient lives alone and she wants to go to skilled nursing facility  Subjective: Seen this morning.  Patient resting comfortably.  Patient reports she has decided to go to skilled nursing facility waiting to talk to the social worker , she has a SNF list and as crossed out  the skilled nursing facility that she does not want to go to.  Assessment & Plan:  Metastatic disease w/ palpable left breast mass, axillary and cervical lymphadenopathy and CT scan with pulmonary nodules, liver masses and bone lesions concerning for metastatic disease: S/p left axillary lymph node biopsy by IR 1/28.MRI negative for brain mets.  Plan is for outpatient follow-up with oncology for further plan   Hypercalcemia of malignancy : calcium much better per oncology acceptable and okay to discharge.s/p pamidronate on 1/27.    Hypokalemia: Replaced  Mild metabolic acidosis likely from liver mets dehydration.  Encourage p.o.  Hypoglycemia CBG 63>55 , likely from n.p.o. allow oral intake.  CBG stable Dehydration with elevated BUN which is improved to 25.  Continue oral intake  Elevated LFTs from liver metastasis.  LFTs are up outpatient follow-up advised with oncology as outpatient Hyponatremia: Resolved Moderate bilateral pleural effusion with basilar atelectasis.Pleural effusion much better with Lasix.  Discontinue Lasix.  She is on room air.  Acute pulmonary insufficiency patient was needing 2 L nasal cannula briefly but has resolved.  Elevated troponin subtle increase no chest pain.  Echo shows EF 65 to 70% no regional WMA likely demand mismatch from pleural effusion Sciatica on rt leg- cvhronic Has not seen doctor in many years. Deconditioning in the setting of multiple comorbidities.  Continue PT OT, patient is requesting to go to skilled nursing facility given that she lives alone not much help at home.  Goals of care palliative  care is following closely.  Status changed to DNR after discussion with patient and POA.  Overall prognosis remains poor pending biopsy result -and plan is for outpatient follow-up with oncology to discuss more once biopsy is back.    Consult dietitian to assess nutritional status and to augment diet  Nutrition: Diet Order            Diet regular Room  service appropriate? Yes; Fluid consistency: Thin  Diet effective now                 There is no height or weight on file to calculate BMI.  Nutrition: Diet Order            Diet regular Room service appropriate? Yes; Fluid consistency: Thin  Diet effective now                       Pt's There is no height or weight on file to calculate BMI.  Pressure Ulcer: Pressure Injury 02/28/20 Sacrum Left Stage 1 -  Intact skin with non-blanchable redness of a localized area usually over a bony prominence. redness (Active)  02/28/20 1747  Location: Sacrum  Location Orientation: Left  Staging: Stage 1 -  Intact skin with non-blanchable redness of a localized area usually over a bony prominence.  Wound Description (Comments): redness  Present on Admission: Yes   DVT prophylaxis: SCDs Start: 02/27/20 2048 Code Status:   Code Status: DNR  Family Communication: plan of care was discussed with patient. Cousin updated by RN Status is: Inpatient Remains inpatient appropriate because:Inpatient level of care appropriate due to severity of illness Dispo: The patient is from: Home              Anticipated d/c is to: snf              Anticipated d/c date is: Bed available              Patient currently is medically stable to d/c.   Difficult to place patient No  Consultants:see note  Procedures:see note  Culture/Microbiology No results found for: SDES, SPECREQUEST, CULT, REPTSTATUS  Other culture-see note  Medications: Scheduled Meds: . furosemide  20 mg Intravenous Daily  . mirtazapine  7.5 mg Oral QHS  . sodium chloride flush  3 mL Intravenous Q12H   Continuous Infusions: . sodium chloride    . dextrose 5 % and 0.9% NaCl 10 mL/hr at 03/01/20 1618   Antimicrobials: Anti-infectives (From admission, onward)   None     Objective: Vitals: Today's Vitals   03/02/20 0310 03/02/20 0404 03/02/20 0825 03/02/20 1009  BP:  (!) 141/75  138/72  Pulse:  96  96  Resp:    18  Temp:   97.6 F (36.4 C)  97.8 F (36.6 C)  TempSrc:  Oral  Oral  SpO2:  93%  96%  Weight:      PainSc: 0-No pain  0-No pain     Intake/Output Summary (Last 24 hours) at 03/02/2020 1217 Last data filed at 03/02/2020 0900 Gross per 24 hour  Intake 684.63 ml  Output 850 ml  Net -165.37 ml   Filed Weights   02/28/20 1747 03/01/20 2042  Weight: 39.4 kg 42 kg   Weight change:   Intake/Output from previous day: 01/29 0701 - 01/30 0700 In: 684.6 [P.O.:240; I.V.:444.6] Out: 850 [Urine:850] Intake/Output this shift: Total I/O In: 120 [P.O.:120] Out: -  Filed Weights   02/28/20 1747 03/01/20 2042  Weight: 39.4 kg 42 kg    Examination: General exam: AAOX3, ill thin looking and frail looking HEENT:Oral mucosa moist, Ear/Nose WNL grossly, dentition normal. Respiratory system: bilaterally CLEAR,no wheezing or crackles,no use of accessory muscle Cardiovascular system: S1 & S2 +, No JVD. Gastrointestinal system: Abdomen soft, NT,ND, BS+ Nervous System:Alert, awake, moving extremities and grossly nonfocal Extremities: No edema, distal peripheral pulses palpable.  Skin: No rashes,no icterus. Left breast mass. MSK: thin  muscle bulk,tone, power  Data Reviewed: I have personally reviewed following labs and imaging studies CBC: Recent Labs  Lab 02/27/20 1537 02/28/20 0443 02/29/20 0515 03/01/20 0323  WBC 9.4 9.2 8.4 7.0  HGB 13.5 12.6 11.9* 13.0  HCT 40.9 38.0 37.7 38.6  MCV 83.1 83.3 84.3 82.1  PLT 394 349 362 440   Basic Metabolic Panel: Recent Labs  Lab 02/27/20 1537 02/28/20 0443 02/29/20 0515 03/01/20 0323  NA 134* 134* 136 139  K 4.1 3.7 3.8 3.3*  CL 97* 97* 98 103  CO2 21* 21* 21* 21*  GLUCOSE 110* 70 63* 127*  BUN 31* 28* 32* 25*  CREATININE 0.95 0.89 0.91 0.75  CALCIUM 11.1* 10.7* 11.1* 9.6   GFR: CrCl cannot be calculated (Unknown ideal weight.). Liver Function Tests: Recent Labs  Lab 02/27/20 1721 02/28/20 0443 02/29/20 0515 03/01/20 0323  AST 154* 143*  143* 252*  ALT 30 27 26  49*  ALKPHOS 652* 590* 583* 534*  BILITOT 0.6 1.2 1.4* 0.8  PROT 6.4* 5.7* 5.7* 5.3*  ALBUMIN 2.6* 2.2* 2.3* 2.1*   No results for input(s): LIPASE, AMYLASE in the last 168 hours. No results for input(s): AMMONIA in the last 168 hours. Coagulation Profile: Recent Labs  Lab 02/29/20 0515  INR 1.0   Cardiac Enzymes: No results for input(s): CKTOTAL, CKMB, CKMBINDEX, TROPONINI in the last 168 hours. BNP (last 3 results) No results for input(s): PROBNP in the last 8760 hours. HbA1C: No results for input(s): HGBA1C in the last 72 hours. CBG: Recent Labs  Lab 02/29/20 0749 02/29/20 0823 02/29/20 1544  GLUCAP 55* 140* 91   Lipid Profile: No results for input(s): CHOL, HDL, LDLCALC, TRIG, CHOLHDL, LDLDIRECT in the last 72 hours. Thyroid Function Tests: No results for input(s): TSH, T4TOTAL, FREET4, T3FREE, THYROIDAB in the last 72 hours. Anemia Panel: No results for input(s): VITAMINB12, FOLATE, FERRITIN, TIBC, IRON, RETICCTPCT in the last 72 hours. Sepsis Labs: No results for input(s): PROCALCITON, LATICACIDVEN in the last 168 hours.  Recent Results (from the past 240 hour(s))  SARS Coronavirus 2 by RT PCR (hospital order, performed in [facility] hospital lab) Nasopharyngeal Nasopharyngeal Swab     Status: None   Collection Time: 02/27/20  9:07 PM   Specimen: Nasopharyngeal Swab  Result Value Ref Range Status   SARS Coronavirus 2 NEGATIVE NEGATIVE Final    Comment: (NOTE) SARS-CoV-2 target nucleic acids are NOT DETECTED.  The SARS-CoV-2 RNA is generally detectable in upper and lower respiratory specimens during the acute phase of infection. The lowest concentration of SARS-CoV-2 viral copies this assay can detect is 250 copies / mL. A negative result does not preclude SARS-CoV-2 infection and should not be used as the sole basis for treatment or other patient management decisions.  A negative result may occur with improper specimen collection /  handling, submission of specimen other than nasopharyngeal swab, presence of viral mutation(s) within the areas targeted by this assay, and inadequate number of viral copies (<250 copies / mL). A negative result must be combined with clinical observations,  patient history, and epidemiological information.  Fact Sheet for Patients:   StrictlyIdeas.no  Fact Sheet for Healthcare Providers: BankingDealers.co.za  This test is not yet approved or  cleared by the [place] FDA and has been authorized for detection and/or diagnosis of SARS-CoV-2 by FDA under an Emergency Use Authorization (EUA).  This EUA will remain in effect (meaning this test can be used) for the duration of the COVID-19 declaration under Section 564(b)(1) of the Act, 21 U.S.C. section 360bbb-3(b)(1), unless the authorization is terminated or revoked sooner.  Performed at [facility], [place] [address] [place], [place] [address]      Radiology Studies: DG Chest 2 View  Result Date: 02/29/2020 CLINICAL DATA:  Pleural effusions, weakness EXAM: CHEST - 2 VIEW COMPARISON:  Radiograph 02/27/2020 FINDINGS: Normal cardiac silhouette. Small LEFT effusion. Lungs are hyperinflated. Chronic bronchitic markings. No pneumothorax. No focal consolidation. Severe degenerative changes in the glenoid fossa. IMPRESSION: 1. Small LEFT effusion. 2. Hyperinflated lungs and chronic bronchitic markings. Electronically Signed   By: Suzy Bouchard M.D.   On: 02/29/2020 12:38   [place] CORE BIOPSY (LYMPH NODES)  Result Date: 02/29/2020 INDICATION: 80 year old female with a history of breast carcinoma with metastatic disease. EXAM: IMAGE GUIDED BIOPSY OF LEFT CHEST WALL MASS/AXILLARY LYMPH NODE MEDICATIONS: None. ANESTHESIA/SEDATION: Moderate (conscious) sedation was employed during this procedure. A total of Versed 0.5 mg and Fentanyl 25 mcg was administered intravenously. Moderate Sedation Time:  10 minutes. The patient's level of consciousness and vital signs were monitored continuously by radiology nursing throughout the procedure under my direct supervision. FLUOROSCOPY TIME:  Ultrasound COMPLICATIONS: None PROCEDURE: Informed written consent was obtained from the patient after a thorough discussion of the procedural risks, benefits and alternatives. All questions were addressed. Maximal Sterile Barrier Technique was utilized including caps, mask, sterile gowns, sterile gloves, sterile drape, hand hygiene and skin antiseptic. A timeout was performed prior to the initiation of the procedure Ultrasound survey was performed with images stored and sent to PACs. The left axillary region was prepped with chlorhexidine in a sterile fashion, and a sterile drape was applied covering the operative field. A sterile gown and sterile gloves were used for the procedure. Local anesthesia was provided with 1% Lidocaine. Ultrasound guidance was used to infiltrate the region with 1% lidocaine for local anesthesia. Four separate 16 gauge core biopsy were then acquired of the left axillary mass/lymph node using ultrasound guidance. Images were stored. Final image was stored after biopsy. Patient tolerated the procedure well and remained hemodynamically stable throughout. No complications were encountered and no significant blood loss was encounter IMPRESSION: Status post ultrasound-guided left axillary mass biopsy. Signed, Dulcy Fanny. Dellia Nims, RPVI [facility] Electronically Signed   By: Corrie Mckusick D.O.   On: 02/29/2020 16:40     LOS: 4 days   Antonieta Pert, MD [facility]  03/02/2020, 12:17 PM

## 2020-03-02 NOTE — Progress Notes (Signed)
Occupational Therapy Treatment Patient Details Name: Morgan Yu MRN: [id number] DOB: 03-Jul-1940 Today's Date: 03/02/2020    History of present illness Pt is a 80 y/o female admitted secondary to worsening LUE swelling. Found to have L breast mass with concern for metastasis. No pertinent PMH in the chart.   OT comments  Pt progressing well. Pt requires assist for OOB ADL and mobility with HHA and set-up to supervisionA. Pt requiring increased time and unsteady without assist +1. Pt stating "I can't go home and take care of mysel," but per PT evaluation with daughter present, pt's daughter can provide support for her. O2 >90% on RA. Pt would benefit from continued OT skilled services. OT following acutely.     Follow Up Recommendations  Home health OT;Supervision/Assistance - 24 hour    Equipment Recommendations  3 in 1 bedside commode    Recommendations for Other Services      Precautions / Restrictions Precautions Precautions: Fall Precaution Comments: RA Restrictions Weight Bearing Restrictions: No       Mobility Bed Mobility Overal bed mobility: Needs Assistance Bed Mobility: Supine to Sit;Sit to Supine     Supine to sit: Min guard Sit to supine: Supervision   General bed mobility comments: SupervisionA to minguardA with rail use  Transfers Overall transfer level: Needs assistance Equipment used: 1 person hand held assist Transfers: Sit to/from [facility] Sit to Stand: Min guard Stand pivot transfers: Min guard       General transfer comment: Pt ambulating a few steps to [facility] <-> bed    Balance Overall balance assessment: Needs assistance Sitting-balance support: No upper extremity supported;Feet supported Sitting balance-Leahy Scale: Good     Standing balance support: Single extremity supported;During functional activity Standing balance-Leahy Scale: Fair Standing balance comment: supervisionA to minguardA durign pericare                            ADL either performed or assessed with clinical judgement   ADL Overall ADL's : Needs assistance/impaired                         Toilet Transfer: Min Psychiatric nurse Details (indicate cue type and reason): HHA +1 for SPT to [facility]. Toileting- Clothing Manipulation and Hygiene: Supervision/safety;Sitting/lateral lean;Sit to/from stand Toileting - Water quality scientist Details (indicate cue type and reason): set-upA only     Functional mobility during ADLs: Min guard General ADL Comments: Pt requires assist for OOB ADL and mobility with HHA and set-up to [place] [facility].     Vision   Vision Assessment?: No apparent visual deficits   Perception     Praxis      Cognition Arousal/Alertness: Awake/alert Behavior During Therapy: WFL for tasks assessed/performed Overall Cognitive Status: Impaired/Different from baseline Area of Impairment: Safety/judgement                         Safety/Judgement: Decreased awareness of safety     General Comments: A/O x4; pt following all commands. "I don't think that I can go home like this. I live alone and I am weak." Previous charting with daughter present, reports that daughter can assist with pt's needs.        Exercises     Shoulder Instructions       General Comments LUE edema, o2 on RA >90%    Pertinent Vitals/ Pain  Pain Assessment: 0-10 Pain Score: 0-No pain Pain Descriptors / Indicators: Discomfort Pain Intervention(s): Monitored during session  Home Living                                          Prior Functioning/Environment              Frequency  Min 2X/week        Progress Toward Goals  OT Goals(current goals can now be found in the care plan section)  Progress towards OT goals: Progressing toward goals  Acute Rehab OT Goals Patient Stated Goal: to go home OT Goal Formulation: With patient Time For Goal Achievement:  03/14/20 Potential to Achieve Goals: Good ADL Goals Pt Will Perform Grooming: with supervision;standing Pt Will Perform Upper Body Dressing: sitting;Independently Pt Will Perform Lower Body Dressing: with supervision;sit to/from stand Pt Will Transfer to Toilet: with supervision;ambulating Pt Will Perform Toileting - Clothing Manipulation and hygiene: with supervision;sit to/from stand Pt/caregiver will Perform Home Exercise Program: Both right and left upper extremity;With written HEP provided  Plan Discharge plan remains appropriate    Co-evaluation                 AM-PAC OT "6 Clicks" Daily Activity     Outcome Measure   Help from another person eating meals?: None Help from another person taking care of personal grooming?: A Little Help from another person toileting, which includes using toliet, bedpan, or urinal?: A Little Help from another person bathing (including washing, rinsing, drying)?: A Little Help from another person to put on and taking off regular upper body clothing?: None Help from another person to put on and taking off regular lower body clothing?: A Little 6 Click Score: 20    End of Session        Activity Tolerance Patient tolerated treatment well   Patient Left in bed;with call bell/phone within reach;with bed alarm set;with nursing/sitter in room   Nurse Communication Mobility status        Time: 7035-0093 OT Time Calculation (min): 24 min  Charges: OT General Charges $OT Visit: 1 Visit OT Treatments $Self Care/Home Management : 8-22 mins $Therapeutic Activity: 8-22 mins  Jefferey Pica, OTR/L Acute Rehabilitation Services Pager: [phone] Office: [phone]    Seanna Sisler C 03/02/2020, 1:58 PM

## 2020-03-02 NOTE — Progress Notes (Addendum)
Daily Progress Note   Patient Name: Morgan Yu       Date: 03/02/2020 DOB: [birth year]  Age: 80 y.o. MRN#: [id number] Attending Physician: Antonieta Pert, MD Primary Care Physician: Patient, No Pcp Per Admit Date: 02/27/2020  Reason for Follow-up: continued [place] discussion, symptom management   Subjective: Patient reports continued poor appetite and difficulty sleeping. She seems in better spirits overall. Reports she was able to get to the [facility] with assistance earlier today.   Jamison Oka at bedside. Reviewed the immediate plan for SNF/rehab and referral to outpatient palliative. The ultimate goal however is to have patient at home. Depending on what decision are made about pursuing cancer treatment, plan would be home with [facility] and PT versus home with hospice. Edd Fabian also plans to start looking for private caregivers to help at home   I completed a MOST form today. The patient and family outlined their wishes for the following treatment decisions:  Cardiopulmonary Resuscitation: Do Not Attempt Resuscitation (DNR/No CPR)  Medical Interventions: Comfort Measures: Keep clean, warm, and dry. Use medication by any route, positioning, wound care, and other measures to relieve pain and suffering. Use oxygen, suction and manual treatment of airway obstruction as needed for comfort. Do not transfer to the hospital unless comfort needs cannot be met in current location.  Antibiotics: Determine use of limitation of antibiotics when infection occurs  IV Fluids: No IV fluids (provide other measures to ensure comfort)  Feeding Tube: No feeding tube     Length of Stay: 4  Current Medications: Scheduled Meds:  . mirtazapine  7.5 mg Oral QHS  . sodium chloride flush  3 mL Intravenous Q12H    Continuous  Infusions: . sodium chloride      PRN Meds: sodium chloride, acetaminophen **OR** acetaminophen, ondansetron **OR** ondansetron (ZOFRAN) IV, polyethylene glycol, traMADol  Physical Exam Vitals reviewed.  Constitutional:      General: She is not in acute distress.    Appearance: She is ill-appearing.     Comments: Thin and frail  Pulmonary:     Effort: Pulmonary effort is normal.  Neurological:     Mental Status: She is alert and oriented to person, place, and time.     Motor: Weakness present.             Vital Signs: BP  138/72 (BP Location: Right Arm)   Pulse 96   Temp 97.8 F (36.6 C) (Oral)   Resp 18   Ht 5' (1.524 m)   Wt 42 kg   SpO2 96%   BMI 18.08 kg/m  SpO2: SpO2: 96 % O2 Device: O2 Device: Room Air O2 Flow Rate: O2 Flow Rate (L/min): 2 L/min  Intake/output summary:   Intake/Output Summary (Last 24 hours) at 03/02/2020 1501 Last data filed at 03/02/2020 0900 Gross per 24 hour  Intake 564.63 ml  Output 850 ml  Net -285.37 ml   LBM: Last BM Date: 03/02/20 Baseline Weight: Weight: 39.4 kg Most recent weight: Weight: 42 kg       Palliative Assessment/Data: PPS 30-40%     Palliative Care Assessment & Plan   HPI/Patient Profile:79 y.o.femalewith no known medical history though has not had medical care in many years who presented to the emergency departmenton1/26/2022with left breast mass and left arm swelling.Patient reports developing pruritus and a lump in the left breast a few months ago, then developed some swelling of the left arm over the past 2 weeks. ED Course:Stable vital signs. Labs significant for alkaline phosphatase of 652, albumin 2.6, and AST 154. CBC unremarkable. High-sensitivity troponin is 18 (twice). BNP normal. Ultrasound of the left upper extremity is negative for DVT nut notable for enlarged axillary lymph node. CTA chest is negative for PE but shows moderate bilateral pleural effusions, patchy airspace opacities, and right  middle and right lower lobe nodules concerning for metastatic disease. CT abdomen shows diffuse hepatic and skeletal metastases.   Assessment: - left palpable breast mass, axillary and cervical lymphadenopathy - pulmonary nodules, liver masses, and bone lesions concerning for metastatic disease - hypercalcemia of malignancy -dehydration - hypoglycemia - elevated LFTs - generalized weakness  Recommendations/Plan: DNR/DNI as previously documented MOST form completed - original placed on shadow chart and copy to be scanned into EMR Continue current medical care Patient to follow up outpatient with oncology for pathology results and treatment plan Increased mirtazapine to 15 mg at bedtime (for sleep and anorexia) Spiritual care consult for support and to have Gayle documented as HCPOA Outpatient palliative referral - I have notified TOC and spoken with liaison from [facility] and Additional Recommendations: Limitations on Scope of Treatment: no artificial feeding, no IV fluids, no BiPAP  Code Status: DNR/DNI  Prognosis: Likely less than 6 months in the setting of metastatic breast cancer and failure to thrive  Discharge Planning: SNF for rehab with outpatient palliative follow-up   Thank you for allowing the Palliative Medicine Team to assist in the care of this patient.   Total Time 25 minutes Prolonged Time Billed  no      Greater than 50%  of this time was spent counseling and coordinating care related to the above assessment and plan.  Lavena Bullion, NP  Please contact Palliative Medicine Team phone at [phone] for questions and concerns.

## 2020-03-02 NOTE — TOC Progression Note (Signed)
Transition of Care [facility]) - Progression Note    Patient Details  Name: ASHETON VIRAMONTES MRN: [id number] Date of Birth: [birth year]  Transition of Care [facility]) CM/SW [place], [place] Phone Number: 03/02/2020, 3:53 PM  Clinical Narrative:   CSW received message from RN that patient's relative, Edd Fabian, requested a call. CSW spoke with Edd Fabian, and she provided information on the patient's preferences for SNF. Patient asked about [facility], [facility], [place], [place] [facility], and [facility]. CSW indicated that [facility] and [facility] were not taking admissions at this time, and [facility] did not take the [employer]. CSW to follow up with [facility] and [place] to see if they would be able to offer on the patient. Noting that OT worked with patient today and is still recommending home health; if the recommendation is not SNF, then insurance will not approve. CSW to follow.    Expected Discharge Plan: [place] Barriers to Discharge: Continued Medical Work up,Insurance Authorization  Expected Discharge Plan and Services Expected Discharge Plan: [place] In-house Referral: Hospice / Palliative Care Discharge Planning Services: CM Consult   Living arrangements for the past 2 months: Single Family Home                                       Social Determinants of Health (SDOH) Interventions    Readmission Risk Interventions No flowsheet data found.

## 2020-03-03 ENCOUNTER — Telehealth: Payer: Self-pay | Admitting: *Deleted

## 2020-03-03 DIAGNOSIS — Z66 Do not resuscitate: Secondary | ICD-10-CM

## 2020-03-03 DIAGNOSIS — J90 Pleural effusion, not elsewhere classified: Secondary | ICD-10-CM

## 2020-03-03 DIAGNOSIS — Z789 Other specified health status: Secondary | ICD-10-CM

## 2020-03-03 DIAGNOSIS — E43 Unspecified severe protein-calorie malnutrition: Secondary | ICD-10-CM

## 2020-03-03 DIAGNOSIS — C50919 Malignant neoplasm of unspecified site of unspecified female breast: Secondary | ICD-10-CM

## 2020-03-03 NOTE — Telephone Encounter (Signed)
Received call from Dr. Tresa Moore in Pathology regarding results of recent lymph node biopsy. She states it is metastatic cancer. Full report will be avialbale soon. Message sent to Dr. Chryl Heck

## 2020-03-03 NOTE — Progress Notes (Signed)
Physical Therapy Treatment Patient Details Name: Morgan Yu MRN: [id number] DOB: 02/20/1940 Today's Date: 03/03/2020    History of Present Illness Pt is a 80 y/o female admitted secondary to worsening LUE swelling. Found to have L breast mass with concern for metastasis. No pertinent PMH in the chart.    PT Comments    Pt was seen for gait and transfers in her room, after discussion about her assistance available at home.  Has her family with her and will be expecting to go home with their help.  Has not received hospice approval over insurance, but will be considered for SNF care as a result.  Pt is requiring a contact assistance with all mobility, and will expect her to receive help in SNF to increase independence.  If family help is available then will expect HHPT and assistance with mobility to get her home.  Follow Up Recommendations  Home health PT     Equipment Recommendations  Rolling walker with 5" wheels    Recommendations for Other Services OT consult     Precautions / Restrictions Precautions Precautions: Fall Precaution Comments: monitor vitals Restrictions Weight Bearing Restrictions: No    Mobility  Bed Mobility Overal bed mobility: Needs Assistance Bed Mobility: Supine to Sit;Sit to Supine     Supine to sit: Min assist Sit to supine: Supervision      Transfers Overall transfer level: Needs assistance   Transfers: Sit to/from Stand Sit to Stand: Min guard            Ambulation/Gait Ambulation/Gait assistance: Min guard;Min assist Gait Distance (Feet): 50 Feet Assistive device: 1 person hand held assist;Rolling walker (2 wheeled) Gait Pattern/deviations: Step-through pattern;Step-to pattern;Decreased stride length;Wide base of support Gait velocity: Decreased Gait velocity interpretation: <1.31 ft/sec, indicative of household ambulator General Gait Details: HHA with pt reaching for furniture with her other hand   Stairs              Wheelchair Mobility    Modified Rankin (Stroke Patients Only)       Balance Overall balance assessment: Needs assistance Sitting-balance support: Feet supported Sitting balance-Leahy Scale: Good     Standing balance support: Single extremity supported;During functional activity Standing balance-Leahy Scale: Fair                              Cognition Arousal/Alertness: Awake/alert Behavior During Therapy: WFL for tasks assessed/performed Overall Cognitive Status: Impaired/Different from baseline Area of Impairment: Safety/judgement;Problem solving;Awareness                         Safety/Judgement: Decreased awareness of safety;Decreased awareness of deficits Awareness: Intellectual Problem Solving: Slow processing;Requires verbal cues;Requires tactile cues        Exercises      General Comments General comments (skin integrity, edema, etc.): Pt is up to walk with help and is more stable wtih a RW, less so with HHA due to instability without two hand support      Pertinent Vitals/Pain Pain Assessment: No/denies pain    Home Living                      Prior Function            PT Goals (current goals can now be found in the care plan section) Acute Rehab PT Goals Patient Stated Goal: to go home Progress towards PT goals: Progressing toward goals  Frequency    Min 3X/week      PT Plan Current plan remains appropriate    Co-evaluation              AM-PAC PT "6 Clicks" Mobility   Outcome Measure  Help needed turning from your back to your side while in a flat bed without using bedrails?: A Little Help needed moving from lying on your back to sitting on the side of a flat bed without using bedrails?: A Little Help needed moving to and from a bed to a chair (including a wheelchair)?: A Little Help needed standing up from a chair using your arms (e.g., wheelchair or bedside chair)?: A Little Help needed to  walk in hospital room?: A Little Help needed climbing 3-5 steps with a railing? : A Lot 6 Click Score: 17    End of Session Equipment Utilized During Treatment: Gait belt Activity Tolerance: Patient tolerated treatment well Patient left: in bed;with call bell/phone within reach;with family/visitor present Nurse Communication: Mobility status PT Visit Diagnosis: Unsteadiness on feet (R26.81);Muscle weakness (generalized) (M62.81)     Time: 0347-4259 PT Time Calculation (min) (ACUTE ONLY): 26 min  Charges:  $Gait Training: 8-22 mins $Therapeutic Activity: 8-22 mins                 Ramond Dial 03/03/2020, 8:25 PM Mee Hives, PT MS Acute Rehab Dept. Number: [place]

## 2020-03-03 NOTE — Progress Notes (Signed)
Daily Progress Note   Patient Name: Morgan Yu       Date: 03/03/2020 DOB: 11/21/1940  Age: 80 y.o. MRN#: [id number] Attending Physician: Antonieta Pert, MD Primary Care Physician: Patient, No Pcp Per Admit Date: 02/27/2020  Reason for Consultation/Follow-up: Disposition and Establishing goals of care  Subjective: Chart review performed - noted lymph node biopsy results returned as metastatic cancer. Received report from primary RN - no acute concerns.   Went to visit patient at bedside - cousin/Morgan Yu was present. Patient was lying in bed awake, alert, oriented, and able to participate conversation. No signs or non-verbal gestures of pain or discomfort noted. No respiratory distress, increased work of breathing, or secretions noted. She denies pain or shortness of breath.   Upon my arrival patient states that she has not yet been informed of biopsy results. Gently updated patient on result of metastatic cancer result. Patient states that was not the news she wanted but the news "does not surprise" her. Emotional support provided and time allowed to think about information just given to her. I asked the patient if she wanted to continue to discuss goals and options or take time to just process information - patient willing to discuss options/next steps. Reviewed that the plan before biopsy results returned were to discharge to SNF rehab with outpatient Palliative Care and follow up with oncology outpatient.  I attempted to elicit values and goals of care important to the patient. The difference between aggressive medical intervention and comfort care was considered in light of the patient's goals of care. The patient stated that she is still interested in getting more information from oncology around  treatment options. She still expresses concern about being too weak for treatment, but would at least like to know her options and the opinion of oncology before making further decisions, which is reasonable. We discussed her motivation to continue working with PT if discharged to rehab facility - she is willing and motivated. She acknowledges that she is too weak to return home and understands that returning home to live alone is risky/unsafe at this time. She is still agreeable to outpatient Palliative Care to follow her at discharge.   Patient asked about completing HCPOA while in house - education provided that we can get that completed with chaplain and that consult has been  placed. Patient wants to name her cousin/Morgan Yu HCPOA.   All questions and concerns addressed. Encouraged to call with questions and/or concerns. PMT card provided.   Length of Stay: 5  Current Medications: Scheduled Meds:  . mirtazapine  15 mg Oral QHS  . sodium chloride flush  3 mL Intravenous Q12H    Continuous Infusions: . sodium chloride      PRN Meds: sodium chloride, acetaminophen **OR** acetaminophen, ondansetron **OR** ondansetron (ZOFRAN) IV, polyethylene glycol, traMADol  Physical Exam Vitals and nursing note reviewed.  Constitutional:      General: She is not in acute distress. Pulmonary:     Effort: No respiratory distress.  Skin:    General: Skin is warm and dry.  Neurological:     Mental Status: She is alert and oriented to person, place, and time.     Motor: Weakness present.  Psychiatric:        Attention and Perception: Attention normal.        Behavior: Behavior is cooperative.        Cognition and Memory: Cognition and memory normal.             Vital Signs: BP 121/82   Pulse (!) 102   Temp 98.3 F (36.8 C) (Oral)   Resp 18   Ht 5' (1.524 m)   Wt 40 kg   SpO2 96%   BMI 17.22 kg/m  SpO2: SpO2: 96 % O2 Device: O2 Device: Room Air O2 Flow Rate: O2 Flow Rate (L/min): 2  L/min  Intake/output summary:   Intake/Output Summary (Last 24 hours) at 03/03/2020 1844 Last data filed at 03/03/2020 1034 Gross per 24 hour  Intake 243 ml  Output 200 ml  Net 43 ml   LBM: Last BM Date: 03/02/20 Baseline Weight: Weight: 39.4 kg Most recent weight: Weight: 40 kg       Palliative Assessment/Data: 30-40%    Flowsheet Rows   Flowsheet Row Most Recent Value  Intake Tab   Clinical Assessment   Psychosocial & Spiritual Assessment   Palliative Care Outcomes   Patient/Family meeting held? Yes  Who was at the meeting? patient, cousin  Palliative Care Outcomes Clarified goals of care, Counseled regarding hospice  Patient/Family wishes: Interventions discontinued/not started  Mechanical Ventilation, BiPAP, Tube feedings/TPN      Patient Active Problem List   Diagnosis Date Noted  . Pressure injury of skin 02/28/2020  . Metastatic disease ([place]) 02/27/2020  . Elevated LFTs 02/27/2020  . Elevated troponin 02/27/2020  . Pleural effusion 02/27/2020  . Breast mass, left 02/27/2020  . Sinus tachycardia 02/27/2020    Palliative Care Assessment & Plan   Patient Profile: 80 y.o.femalewith no known medical history though has not had medical care in many years who presented to the emergency departmenton1/26/2022with left breast mass and left arm swelling.Patient reports developing pruritus and a lump in the left breast a few months ago, then developed some swelling of the left arm over the past 2 weeks. ED Course:Stable vital signs. Labs significant for alkaline phosphatase of 652, albumin 2.6, and AST 154. CBC unremarkable. High-sensitivity troponin is 18 (twice). BNP normal. Ultrasound of the left upper extremity is negative for DVT nut notable for enlarged axillary lymph node. CTA chest is negative for PE but shows moderate bilateral pleural effusions, patchy airspace opacities, and right middle and right lower lobe nodules concerning for metastatic disease. CT  abdomen shows diffuse hepatic and skeletal metastases.  Assessment: Left palpable breast mass  Axillary and cervical lymphadenopathy  Pulmonary nodules Liver masses Bone lesions  Moderate bilateral pleural effusion Hypercalcemia of malignancy Dehydration Hypoglycemia Elevated LFTs Generalized weakness  Recommendations/Plan: Continue current medical treatment Continue DNR/DNI as previously documented Patient would like to speak with oncology to obtain more information before making further/final decisions around pursing treatment vs hospice Patient is hopeful oncology can see her prior to discharge - Oncology notified of patient's request Patient is agreeable for discharge to SNF rehab with outpatient Palliative Care to follow and see oncology outpatient if they are not able to follow up in house  Patient wants to make her cousin/Morgan Yu Audie Pinto - chaplain previously consulted PMT will continue to follow and support holistically  Goals of Care and Additional Recommendations: Limitations on Scope of Treatment: Full Scope Treatment  Code Status:    Code Status Orders  (From admission, onward)         Start     Ordered   02/28/20 0728  Do not attempt resuscitation (DNR)  Continuous       Question Answer Comment  In the event of cardiac or respiratory ARREST Do not call a "code blue"   In the event of cardiac or respiratory ARREST Do not perform Intubation, CPR, defibrillation or ACLS   In the event of cardiac or respiratory ARREST Use medication by any route, position, wound care, and other measures to relive pain and suffering. May use oxygen, suction and manual treatment of airway obstruction as needed for comfort.      02/28/20 0727        Code Status History    Date Active Date Inactive Code Status Order ID Comments User Context   02/27/2020 2049 02/28/2020 0727 Full Code [id number]  Vianne Bulls, MD ED   Advance Care Planning Activity      Prognosis:  Unable  to determine - pending if patient decides to pursue treatment.   Discharge Planning: To Be Determined SNF rehab with outpatient PC vs home hospice  Care plan was discussed with patient, patient's cousin/Morgan Yu, primary RN, Mikey Bussing, Morgan Yu/oncology, Dr. Iruka/oncology  Thank you for allowing the Palliative Medicine Team to assist in the care of this patient.   Total Time 40 minutes Prolonged Time Billed  no       Greater than 50%  of this time was spent counseling and coordinating care related to the above assessment and plan.  Morgan Landsman, Morgan Yu  Please contact Palliative Medicine Team phone at [phone] for questions and concerns.

## 2020-03-03 NOTE — Progress Notes (Signed)
PROGRESS NOTE    Morgan Yu  OJJ:[id number] DOB: [birth year] DOA: 02/27/2020 PCP: Patient, No Pcp Per   Chief Complaint  Patient presents with  . Weakness    Brief Narrative: 80 year old female no significant past medical history but has not seen a PCP in many years presented to the ED with left breast mass and left arm swelling  As per report-she notes that she developed pruritus and later a lump involving the left breast a few months ago and then developed some swelling of the left arm over the past 2 weeks.  She has had 1 mammogram which she states was many years ago.  Never had a colonoscopy.  She denies any chest pain, fevers, chills, leg swelling, cough, or shortness of breath.  ED Course:Upon arrival to the ED, patient is found to be afebrile, saturating well on room air, and tachycardic to 110 with stable blood pressure.  EKG features sinus tachycardia and PAC.  Chemistry panel notable for alkaline phosphatase of 652, albumin 2.6, and AST 154.  CBC is unremarkable.  High-sensitivity troponin is 18 twice.  BNP is normal.  CTA chest is negative for PE but notable for moderate bilateral pleural effusions, patchy airspace opacities, right middle and right lower lobe nodules concerning for metastatic disease, as well as diffuse hepatic and skeletal metastases.  Ultrasound of the left upper extremity is negative for DVT but notable for enlarged axillary node.  Patient was given 20 mg IV Lasix in the ED.  Seen by hematology oncology noted to have hypercalcemia of malignancy.  Underwent left axillary biopsy 1/28. Pt calcium is much better after  Pamidronate. Doing well, off o2.Oncology PT,OT has seen and advised HHPT,3 IN 1. Patient lives alone and she wants to go to skilled nursing facility/ALF and TOC working on placement  Subjective: Seen this morning.  Patient appears to be very, reports he needs assistance and wants to go to ALF/SNF and waiting on placement  No other acute events  overnight  Assessment & Plan:  Metastatic disease w/ palpable left breast mass, axillary and cervical lymphadenopathy and CT scan with pulmonary nodules, liver masses and bone lesions concerning for metastatic disease: S/p left axillary lymph node biopsy by IR 1/28-biopsy result came back today soon primary breast cancer, prognostic markers pending.MRI negative for brain mets. Plan is for outpatient follow-up with oncology for further management.   Hypercalcemia of malignancy :  S/p pamidronate on 1/27.  Follow-up calcium level outpatient with oncology follow-up.  Hypokalemia: Replaced Dehydration/hyponatremia/mild metabolic acidosis in the setting of poor oral intake, encourage oral hydration.    Hypoglycemia: Resolved  Elevated LFTs from liver metastasis.  LFTs are up outpatient follow-up advised with oncology as outpatient  Moderate bilateral pleural effusion with basilar atelectasis.Pleural effusion much better with Lasix.  Discontinued Lasix.  Denies shortness of breath.  On room air.    Acute pulmonary insufficiency patient was needing 2 L nasal cannula briefly but has resolved.  Elevated troponin subtle increase no chest pain.  Echo shows EF 65 to 70% no regional WMA likely demand mismatch from pleural effusion Sciatica on rt leg- cvhronic Has not seen doctor in many years. Deconditioning in the setting of multiple comorbidities.  Continue PT OT, patient is requesting to go to skilled nursing facility as she lives alone and not much help at home  Goals of care palliative care is following closely.  Status changed to DNR after discussion with patient and POA.  Overall prognosis remains poor pending biopsy result -  and plan is for outpatient follow-up with oncology to discuss more once biopsy is back.    Poor nutritional status with poor oral intake suspect protein calorie malnutrition,moderate:from  malignancy. Patient has the following signs/symptoms consistent with PCM: FAT LOSS, thin  muscle mass, low BMI at 17 Plan: Dietitian consult and augment nutrition/add suupplement to improve caloric intake. Cont remeron.  Nutrition: Diet Order            Diet regular Room service appropriate? Yes; Fluid consistency: Thin  Diet effective now                 Body mass index is 17.22 kg/m.  Nutrition: Diet Order            Diet regular Room service appropriate? Yes; Fluid consistency: Thin  Diet effective now               Pt's Body mass index is 17.22 kg/m.  Pressure Ulcer: Pressure Injury 02/28/20 Sacrum Left Stage 1 -  Intact skin with non-blanchable redness of a localized area usually over a bony prominence. redness (Active)  02/28/20 1747  Location: Sacrum  Location Orientation: Left  Staging: Stage 1 -  Intact skin with non-blanchable redness of a localized area usually over a bony prominence.  Wound Description (Comments): redness  Present on Admission: Yes   DVT prophylaxis: SCDs Start: 02/27/20 2048 Code Status:   Code Status: DNR  Family Communication: plan of care was discussed with patient. Cousin updated by RN Status is: Inpatient Remains inpatient appropriate because:Inpatient level of care appropriate due to severity of illness Dispo: The patient is from: Home              Anticipated d/c is to: snf              Anticipated d/c date is: Bed available              Patient currently is medically stable to d/c.   Difficult to place patient No  Consultants:see note  Procedures:see note  Culture/Microbiology No results found for: SDES, SPECREQUEST, CULT, REPTSTATUS  Other culture-see note  Medications: Scheduled Meds: . mirtazapine  15 mg Oral QHS  . sodium chloride flush  3 mL Intravenous Q12H   Continuous Infusions: . sodium chloride      Antimicrobials: Anti-infectives (From admission, onward)   None     Objective: Vitals: Today's Vitals   03/02/20 2333 03/03/20 0538 03/03/20 0617 03/03/20 0705  BP:  132/70    Pulse:  (!)  105    Resp:      Temp:      TempSrc:      SpO2:  93%    Weight:      Height:      PainSc: Asleep  5  0-No pain    Intake/Output Summary (Last 24 hours) at 03/03/2020 1005 Last data filed at 03/03/2020 0959 Gross per 24 hour  Intake 363 ml  Output 200 ml  Net 163 ml   Filed Weights   03/01/20 2042 03/02/20 1200 03/02/20 2114  Weight: 42 kg 42 kg 40 kg   Weight change: 0 kg  Intake/Output from previous day: 01/30 0701 - 01/31 0700 In: 480 [P.O.:480] Out: 200 [Urine:200] Intake/Output this shift: Total I/O In: 3 [I.V.:3] Out: -  Filed Weights   03/01/20 2042 03/02/20 1200 03/02/20 2114  Weight: 42 kg 42 kg 40 kg    Examination: General exam: AAOx3,cachectic, ill ,weak, appearing HEENT:Oral mucosa moist, Ear/Nose WNL  grossly,dentition normal. Respiratory system: bilaterally clear,no wheezing or crackles,no use of accessory muscle, non tender. Cardiovascular system: S1 & S2 +, regular, No JVD. Gastrointestinal system: Abdomen soft, NT,ND, BS+. Nervous System:Alert, awake, moving extremities and grossly nonfocal Extremities: No edema, distal peripheral pulses palpable.  Skin: No rashes,no icterus. MSK: thin muscle mass, temporal area thin,tone, power   Data Reviewed: I have personally reviewed following labs and imaging studies CBC: Recent Labs  Lab 02/27/20 1537 02/28/20 0443 02/29/20 0515 03/01/20 0323  WBC 9.4 9.2 8.4 7.0  HGB 13.5 12.6 11.9* 13.0  HCT 40.9 38.0 37.7 38.6  MCV 83.1 83.3 84.3 82.1  PLT 394 349 362 144   Basic Metabolic Panel: Recent Labs  Lab 02/27/20 1537 02/28/20 0443 02/29/20 0515 03/01/20 0323  NA 134* 134* 136 139  K 4.1 3.7 3.8 3.3*  CL 97* 97* 98 103  CO2 21* 21* 21* 21*  GLUCOSE 110* 70 63* 127*  BUN 31* 28* 32* 25*  CREATININE 0.95 0.89 0.91 0.75  CALCIUM 11.1* 10.7* 11.1* 9.6   GFR: Estimated Creatinine Clearance: 36 mL/min (by C-G formula based on SCr of 0.75 mg/dL). Liver Function Tests: Recent Labs  Lab  02/27/20 1721 02/28/20 0443 02/29/20 0515 03/01/20 0323  AST 154* 143* 143* 252*  ALT 30 27 26  49*  ALKPHOS 652* 590* 583* 534*  BILITOT 0.6 1.2 1.4* 0.8  PROT 6.4* 5.7* 5.7* 5.3*  ALBUMIN 2.6* 2.2* 2.3* 2.1*   No results for input(s): LIPASE, AMYLASE in the last 168 hours. No results for input(s): AMMONIA in the last 168 hours. Coagulation Profile: Recent Labs  Lab 02/29/20 0515  INR 1.0   Cardiac Enzymes: No results for input(s): CKTOTAL, CKMB, CKMBINDEX, TROPONINI in the last 168 hours. BNP (last 3 results) No results for input(s): PROBNP in the last 8760 hours. HbA1C: No results for input(s): HGBA1C in the last 72 hours. CBG: Recent Labs  Lab 02/29/20 0749 02/29/20 0823 02/29/20 1544  GLUCAP 55* 140* 91   Lipid Profile: No results for input(s): CHOL, HDL, LDLCALC, TRIG, CHOLHDL, LDLDIRECT in the last 72 hours. Thyroid Function Tests: No results for input(s): TSH, T4TOTAL, FREET4, T3FREE, THYROIDAB in the last 72 hours. Anemia Panel: No results for input(s): VITAMINB12, FOLATE, FERRITIN, TIBC, IRON, RETICCTPCT in the last 72 hours. Sepsis Labs: No results for input(s): PROCALCITON, LATICACIDVEN in the last 168 hours.  Recent Results (from the past 240 hour(s))  SARS Coronavirus 2 by RT PCR (hospital order, performed in [facility] hospital lab) Nasopharyngeal Nasopharyngeal Swab     Status: None   Collection Time: 02/27/20  9:07 PM   Specimen: Nasopharyngeal Swab  Result Value Ref Range Status   SARS Coronavirus 2 NEGATIVE NEGATIVE Final    Comment: (NOTE) SARS-CoV-2 target nucleic acids are NOT DETECTED.  The SARS-CoV-2 RNA is generally detectable in upper and lower respiratory specimens during the acute phase of infection. The lowest concentration of SARS-CoV-2 viral copies this assay can detect is 250 copies / mL. A negative result does not preclude SARS-CoV-2 infection and should not be used as the sole basis for treatment or other patient management  decisions.  A negative result may occur with improper specimen collection / handling, submission of specimen other than nasopharyngeal swab, presence of viral mutation(s) within the areas targeted by this assay, and inadequate number of viral copies (<250 copies / mL). A negative result must be combined with clinical observations, patient history, and epidemiological information.  Fact Sheet for Patients:   StrictlyIdeas.no  Fact Sheet for Healthcare Providers: BankingDealers.co.za  This test is not yet approved or  cleared by the [place] FDA and has been authorized for detection and/or diagnosis of SARS-CoV-2 by FDA under an Emergency Use Authorization (EUA).  This EUA will remain in effect (meaning this test can be used) for the duration of the COVID-19 declaration under Section 564(b)(1) of the Act, 21 U.S.C. section 360bbb-3(b)(1), unless the authorization is terminated or revoked sooner.  Performed at [facility], [place] [address], [place], [place] [address]      Radiology Studies: No results found.   LOS: 5 days   Antonieta Pert, MD [facility]  03/03/2020, 10:05 AM

## 2020-03-03 NOTE — Progress Notes (Signed)
   We received referral over weekend for this pt for home Palliative care. Unfortunately the pt does not have insurance within network for [place] to provide this care. I have updated SW as well Lorriane Shire know.   Please call with any questions: Webb Silversmith RN [phone]

## 2020-03-03 NOTE — TOC Progression Note (Signed)
Transition of Care [facility]) - Progression Note    Patient Details  Name: Morgan Yu MRN: [id number] Date of Birth: [birth year]  Transition of Care [facility]) CM/SW Contact  Bartholomew Crews, RN Phone Number:  [phone] 03/03/2020, 6:00 PM  Clinical Narrative:     Spoke with patient and Edd Fabian at the bedside. No bed offers at this time. Search expanded. Discussed barriers with few SNFs in network with patient's insurance. Discussed recommendations from therapy for [facility] and 24 hour supervision. 24 hour supervision is not a Medicare guideline for SNF benefit. Discussed home with family and private caregiver support and [facility] for therapy. Discussed private caregiver resources. Discussed patient staying with Edd Fabian, however, patient has a dog and cat that need care.   Patient stated that she has a cancer policy. She was told by insurance company that she needed the policy report. Advised to follow up with oncology. Noted appt scheduled for Thursday afternoon this week. Appt information provided to [facility].   Patient and Edd Fabian asking about HCPOA paperwork. Edd Fabian is patient's POA, but patient has not completed documents for HCPOA. Spiritual consult placed.   TOC following for transition needs.   Expected Discharge Plan: [place] Barriers to Discharge: Continued Medical Work up,Insurance Authorization  Expected Discharge Plan and Services Expected Discharge Plan: [place] In-house Referral: Hospice / Palliative Care Discharge Planning Services: CM Consult   Living arrangements for the past 2 months: Single Family Home                                       Social Determinants of Health (SDOH) Interventions    Readmission Risk Interventions No flowsheet data found.

## 2020-03-04 DIAGNOSIS — E43 Unspecified severe protein-calorie malnutrition: Secondary | ICD-10-CM | POA: Insufficient documentation

## 2020-03-04 DIAGNOSIS — C7951 Secondary malignant neoplasm of bone: Secondary | ICD-10-CM

## 2020-03-04 MED ORDER — ENSURE ENLIVE PO LIQD
237.0000 mL | Freq: Three times a day (TID) | ORAL | Status: DC
  Administered 2020-03-04 – 2020-03-05 (×2): 237 mL via ORAL

## 2020-03-04 NOTE — TOC Progression Note (Signed)
Transition of Care [facility]) - Progression Note    Patient Details  Name: Morgan Yu MRN: [id number] Date of Birth: [birth year]  Transition of Care [facility]) CM/SW Contact  Bartholomew Crews, RN Phone Number: [phone] 03/04/2020, 12:03 PM  Clinical Narrative:     Spoke with patient at bedside. Bed offer provided with Medicare.gov review print off. Patient to discuss with [facility]. TOC following for transition needs.   Expected Discharge Plan: [place] Barriers to Discharge: Continued Medical Work up,Insurance Authorization  Expected Discharge Plan and Services Expected Discharge Plan: [place] In-house Referral: Hospice / Palliative Care Discharge Planning Services: CM Consult   Living arrangements for the past 2 months: Single Family Home                                       Social Determinants of Health (SDOH) Interventions    Readmission Risk Interventions No flowsheet data found.

## 2020-03-04 NOTE — Progress Notes (Signed)
Chaplain responded to [place] for [place].  Chaplain explained HCPOA and assisted pt in filling out form.  Chaplain advised inability to notarize document during this time of Covid.  Chaplain offered the rehab facility where pt will go after discharge will likely have a notary.  Pt denied anything further from [place].  [place]

## 2020-03-04 NOTE — TOC Progression Note (Signed)
Transition of Care [facility]) - Progression Note    Patient Details  Name: Morgan Yu MRN: [id number] Date of Birth: 1940-06-11  Transition of Care [facility]) CM/SW Contact  Bartholomew Crews, RN Phone Number: [phone] 03/04/2020, 5:17 PM  Clinical Narrative:     Spoke with patient and Edd Fabian at the bedside this afternoon. Patient declined the only bed offer received. Discussed needs for transition home.   DME 3N1 and RW walker ordered through [employer] for delivery to the room.   HH PT, OT, and SW arranged through [facility].   Discussed a variety of home care providers for caregiver assistance at home. Corliss Blacker at [facility] to meet with patient and Edd Fabian tomorrow morning about 10am to discuss what [facility]ana [facility] can offer.   Assisted patient and Edd Fabian to access My Chart successfully.   Family to provide transport home at time of discharge.   TOC following for transition needs.   Expected Discharge Plan: [place] Barriers to Discharge: Continued Medical Work up  Expected Discharge Plan and Services Expected Discharge Plan: [place] In-house Referral: Hospice / Palliative Care Discharge Planning Services: CM Consult Post Acute Care Choice: [place] arrangements for the past 2 months: Single Family Home                 DME Arranged: 3-N-1,Walker DME Agency: [employer] Date DME Agency Contacted: 03/04/20 Time DME Agency Contacted: [phone] Representative spoke with at DME Agency: Freda Munro [place]: PT,OT,Social Work [place]: [facility] [place] Date [place]: 03/04/20 Time [place]: [place] Representative spoke with at [place]: [place] ([place]) Interventions    Readmission Risk Interventions No flowsheet data found.

## 2020-03-04 NOTE — Progress Notes (Signed)
Initial Nutrition Assessment  DOCUMENTATION CODES:   Severe malnutrition in context of chronic illness  INTERVENTION:  Ensure Enlive po TID, each supplement provides 350 kcal and 20 grams of protein  Magic cup TID with meals, each supplement provides 290 kcal and 9 grams of protein    NUTRITION DIAGNOSIS:   Severe Malnutrition related to chronic illness (cancer) as evidenced by severe muscle depletion,severe fat depletion.    GOAL:   Patient will meet greater than or equal to 90% of their needs    MONITOR:   PO intake,Supplement acceptance,Weight trends,Skin  REASON FOR ASSESSMENT:   Consult Assessment of nutrition requirement/status  ASSESSMENT:   Pt admitted with palpable L breast mass, axillary and cervical lymphadenopathy and CT scan with pulmonary nodules, liver masses and bone lesions concerning for metastatic disease. Biopsy confirmed primary breast cancer; prognostic markers pending, MRI negative for brain mets. No known PMH; pt has not been to the doctor in many years.  Pt pending ALF/SNF placement. Per MD, pt is to have outpatient follow-up with oncology.   PMT is following. Per Palliative note on 1/30 and MOST form, pt is now on comfort measures. However, MD requested that RD assess pt for nutrition status/requirements. Note pt does not wish to have artificial nutrition or IVF.  Pt sleeping at time of RD visit and did not wake to RD voice/touch.    No weight history available for review. Note pt is underweight.  PO intake: 0-90% x last 8 recorded meals (48% average meal intake)  No UOP documented.  No labs taken since 1/29  Medications: remeron  NUTRITION - FOCUSED PHYSICAL EXAM:  Flowsheet Row Most Recent Value  Orbital Region Severe depletion  Upper Arm Region Severe depletion  Thoracic and Lumbar Region Severe depletion  Buccal Region Severe depletion  Temple Region Severe depletion  Clavicle Bone Region Severe depletion  Clavicle and  Acromion Bone Region Severe depletion  Scapular Bone Region Severe depletion  Dorsal Hand Severe depletion  Patellar Region Severe depletion  Anterior Thigh Region Severe depletion  Posterior Calf Region Severe depletion  Edema (RD Assessment) Mild  [LUE]  Hair Reviewed  Eyes Unable to assess  Mouth Unable to assess  Skin Reviewed  Nails Reviewed       Diet Order:   Diet Order            Diet regular Room service appropriate? Yes; Fluid consistency: Thin  Diet effective now                 EDUCATION NEEDS:   No education needs have been identified at this time  Skin:  Skin Assessment: Skin Integrity Issues: Skin Integrity Issues:: Stage I Stage I: sacrum  Last BM:  1/30  Height:   Ht Readings from Last 1 Encounters:  03/02/20 5' (1.524 m)    Weight:   Wt Readings from Last 1 Encounters:  03/02/20 40 kg    BMI:  Body mass index is 17.22 kg/m.  Estimated Nutritional Needs:   Kcal:  1200-1400  Protein:  60-70 grams  Fluid:  >1.2L/d    Larkin Ina, MS, RD, LDN RD pager number and weekend/on-call pager number located in [place].

## 2020-03-04 NOTE — Progress Notes (Addendum)
HEMATOLOGY-ONCOLOGY PROGRESS NOTE  SUBJECTIVE: Morgan Yu offers no complaints today.  Denies pain.  The patient is awaiting discharge and will likely go to SNF for rehabilitation.  REVIEW OF SYSTEMS:   Constitutional: Denies fevers, chills.  Positive for anorexia and weight loss. Eyes: Denies blurriness of vision Ears, nose, mouth, throat, and face: Denies mucositis or sore throat Respiratory: Denies cough, dyspnea or wheezes Cardiovascular: Denies palpitation, chest discomfort Gastrointestinal:  Denies nausea, heartburn or change in bowel habits Skin: Denies abnormal skin rashes Lymphatics: Denies new lymphadenopathy or easy bruising Neurological:Denies numbness, tingling or new weaknesses Behavioral/Psych: Mood is stable, no new changes  Extremities: No lower extremity edema Breast: Left breast mass with left arm swelling All other systems were reviewed with the patient and are negative.  I have reviewed the past medical history, past surgical history, social history and family history with the patient and they are unchanged from previous note.   PHYSICAL EXAMINATION: ECOG PERFORMANCE STATUS: 2 - Symptomatic, <50% confined to bed  Vitals:   03/04/20 0617 03/04/20 0918  BP: 138/65 132/63  Pulse: (!) 103 96  Resp: 19 20  Temp: 98.5 F (36.9 C) 97.9 F (36.6 C)  SpO2: 98% 94%   Filed Weights   03/01/20 2042 03/02/20 1200 03/02/20 2114  Weight: 42 kg 42 kg 40 kg    Intake/Output from previous day: 01/31 0701 - 02/01 0700 In: 523 [P.O.:520; I.V.:3] Out: 101 [Urine:100; Stool:1]  General: Elderly female, no distress    Eyes:  no scleral icterus.   ENT:  There were no oropharyngeal lesions.   Respiratory: lungs were clear bilaterally without wheezing or crackles.   Cardiovascular:  Regular rate and rhythm, S1/S2, without murmur, rub or gallop.  There was no pedal edema.   GI:  abdomen was soft, flat, nontender, nondistended, without organomegaly.   Skin exam was without  echymosis, petichae.   Neuro exam was nonfocal. Patient was alert and oriented.  Attention was good. Language was appropriate.  Mood was normal without depression.  Speech was not pressured.  Thought content was not tangential.    LABORATORY DATA:  I have reviewed the data as listed CMP Latest Ref Rng & Units 03/01/2020 02/29/2020 02/28/2020  Glucose 70 - 99 mg/dL 127(H) 63(L) 70  BUN 8 - 23 mg/dL 25(H) 32(H) 28(H)  Creatinine 0.44 - 1.00 mg/dL 0.75 0.91 0.89  Sodium 135 - 145 mmol/L 139 136 134(L)  Potassium 3.5 - 5.1 mmol/L 3.3(L) 3.8 3.7  Chloride 98 - 111 mmol/L 103 98 97(L)  CO2 22 - 32 mmol/L 21(L) 21(L) 21(L)  Calcium 8.9 - 10.3 mg/dL 9.6 11.1(H) 10.7(H)  Total Protein 6.5 - 8.1 g/dL 5.3(L) 5.7(L) 5.7(L)  Total Bilirubin 0.3 - 1.2 mg/dL 0.8 1.4(H) 1.2  Alkaline Phos 38 - 126 U/L 534(H) 583(H) 590(H)  AST 15 - 41 U/L 252(H) 143(H) 143(H)  ALT 0 - 44 U/L 49(H) 26 27    Lab Results  Component Value Date   WBC 7.0 03/01/2020   HGB 13.0 03/01/2020   HCT 38.6 03/01/2020   MCV 82.1 03/01/2020   PLT 316 03/01/2020    DG Chest 2 View  Result Date: 02/29/2020 CLINICAL DATA:  Pleural effusions, weakness EXAM: CHEST - 2 VIEW COMPARISON:  Radiograph 02/27/2020 FINDINGS: Normal cardiac silhouette. Small LEFT effusion. Lungs are hyperinflated. Chronic bronchitic markings. No pneumothorax. No focal consolidation. Severe degenerative changes in the glenoid fossa. IMPRESSION: 1. Small LEFT effusion. 2. Hyperinflated lungs and chronic bronchitic markings. Electronically Signed  By: Suzy Bouchard M.D.   On: 02/29/2020 12:38   CT Angio Chest PE W and/or Wo Contrast  Result Date: 02/27/2020 CLINICAL DATA:  Acute abdominal pain. Elevated liver function test. Suspected breast cancer. Query metastasis. Query shortness of breath. Pulmonary embolus suspected with high probability. EXAM: CT ANGIOGRAPHY CHEST CT ABDOMEN AND PELVIS WITH CONTRAST TECHNIQUE: Multidetector CT imaging of the chest was  performed using the standard protocol during bolus administration of intravenous contrast. Multiplanar CT image reconstructions and MIPs were obtained to evaluate the vascular anatomy. Multidetector CT imaging of the abdomen and pelvis was performed using the standard protocol during bolus administration of intravenous contrast. CONTRAST:  169mL OMNIPAQUE IOHEXOL 350 MG/ML SOLN COMPARISON:  Chest radiograph 02/27/2020 FINDINGS: CTA CHEST FINDINGS Cardiovascular: Satisfactory opacification of the pulmonary arteries to the segmental level. No evidence of pulmonary embolism. Normal heart size. No pericardial effusion. Normal caliber thoracic aorta. No aortic dissection. Mediastinum/Nodes: Esophagus is decompressed. No significant lymphadenopathy in the mediastinum. Multiple enlarged lymph nodes in the left axilla, measuring up to 2.5 x 3.3 cm in diameter, likely metastatic. Lungs/Pleura: Moderate bilateral pleural effusions. Atelectasis in the lung bases with patchy airspace infiltrates in the remaining lungs. This could represent edema or pneumonia. Noncalcified nodule in the right middle lung measuring 9 mm in diameter, likely metastatic. Additional smaller nodules demonstrated in the right lower lung. Evaluation is limited due to motion artifact. Musculoskeletal: Diffuse skeletal metastasis with mixed sclerotic and lucent lesions throughout the visualized skeletal system. Review of the MIP images confirms the above findings. CT ABDOMEN and PELVIS FINDINGS Hepatobiliary: Multiple low-attenuation peripherally enhancing lesions demonstrated throughout the liver consistent with diffuse hepatic metastasis. Portal veins are patent. Gallbladder and bile ducts are unremarkable. Pancreas: Unremarkable. No pancreatic ductal dilatation or surrounding inflammatory changes. Spleen: Normal in size without focal abnormality. Adrenals/Urinary Tract: Adrenal glands are unremarkable. Kidneys are normal, without renal calculi, focal  lesion, or hydronephrosis. Bladder is unremarkable. Stomach/Bowel: Stomach is within normal limits. Appendix appears normal. No evidence of bowel wall thickening, distention, or inflammatory changes. Vascular/Lymphatic: Aortic atherosclerosis. No enlarged abdominal or pelvic lymph nodes. Reproductive: Uterus and bilateral adnexa are unremarkable. Other: Small amount of free fluid in the abdomen and pelvis, possibly reactive or may indicate peritoneal metastasis. Prominent upper abdominal varices. Edema in the subcutaneous fat throughout the abdominal wall. No free air. Musculoskeletal: Diffuse skeletal metastasis with mixed sclerotic and lucent lesions for about the visualized skeletal elements. Review of the MIP images confirms the above findings. IMPRESSION: 1. No evidence of significant pulmonary embolus. 2. Moderate bilateral pleural effusions with basilar atelectasis. Patchy airspace infiltrates in the remaining lungs may represent edema or pneumonia. 3. Noncalcified nodules in the right middle lung and right lower lung, likely metastatic. 4. Diffuse hepatic metastasis. 5. Diffuse skeletal metastasis. 6. Prominent upper abdominal varices. 7. Small amount of free fluid in the abdomen and pelvis, may be reactive or may indicate peritoneal metastasis. 8. Edema in the subcutaneous fat throughout the abdominal wall. Aortic Atherosclerosis (ICD10-I70.0). Electronically Signed   By: Lucienne Capers M.D.   On: 02/27/2020 19:40   MR Brain W Wo Contrast  Result Date: 02/28/2020 CLINICAL DATA:  Breast cancer, staging EXAM: MRI HEAD WITHOUT AND WITH CONTRAST TECHNIQUE: Multiplanar, multiecho pulse sequences of the brain and surrounding structures were obtained without and with intravenous contrast. CONTRAST:  61mL GADAVIST GADOBUTROL 1 MMOL/ML IV SOLN COMPARISON:  None. FINDINGS: Brain: There is no acute infarction or intracranial hemorrhage. There is no intracranial mass, mass effect, or  edema. There is no  hydrocephalus or extra-axial fluid collection. Prominence of the ventricles and sulci reflects minor generalized parenchymal volume loss. Patchy foci of T2 hyperintensity in the supratentorial white matter are nonspecific but probably reflect mild chronic microvascular ischemic changes. Punctate focus of susceptibility in the right temporal subcortical white matter likely reflects chronic microhemorrhage. No abnormal enhancement. Vascular: Major vessel flow voids at the skull base are preserved. Skull and upper cervical spine: Abnormal T1 marrow signal in the visualized cervical spine. Patchy abnormal marrow signal the calvarium and skull base. Sinuses/Orbits: Paranasal sinuses are aerated. Orbits are unremarkable. Other: Sella is unremarkable.  Mastoid air cells are clear. IMPRESSION: No evidence of intracranial metastatic disease. Diffuse osseous metastatic disease. Mild chronic microvascular ischemic changes. Electronically Signed   By: Macy Mis M.D.   On: 02/28/2020 13:57   CT ABDOMEN PELVIS W CONTRAST  Result Date: 02/27/2020 CLINICAL DATA:  Acute abdominal pain. Elevated liver function test. Suspected breast cancer. Query metastasis. Query shortness of breath. Pulmonary embolus suspected with high probability. EXAM: CT ANGIOGRAPHY CHEST CT ABDOMEN AND PELVIS WITH CONTRAST TECHNIQUE: Multidetector CT imaging of the chest was performed using the standard protocol during bolus administration of intravenous contrast. Multiplanar CT image reconstructions and MIPs were obtained to evaluate the vascular anatomy. Multidetector CT imaging of the abdomen and pelvis was performed using the standard protocol during bolus administration of intravenous contrast. CONTRAST:  141mL OMNIPAQUE IOHEXOL 350 MG/ML SOLN COMPARISON:  Chest radiograph 02/27/2020 FINDINGS: CTA CHEST FINDINGS Cardiovascular: Satisfactory opacification of the pulmonary arteries to the segmental level. No evidence of pulmonary embolism. Normal  heart size. No pericardial effusion. Normal caliber thoracic aorta. No aortic dissection. Mediastinum/Nodes: Esophagus is decompressed. No significant lymphadenopathy in the mediastinum. Multiple enlarged lymph nodes in the left axilla, measuring up to 2.5 x 3.3 cm in diameter, likely metastatic. Lungs/Pleura: Moderate bilateral pleural effusions. Atelectasis in the lung bases with patchy airspace infiltrates in the remaining lungs. This could represent edema or pneumonia. Noncalcified nodule in the right middle lung measuring 9 mm in diameter, likely metastatic. Additional smaller nodules demonstrated in the right lower lung. Evaluation is limited due to motion artifact. Musculoskeletal: Diffuse skeletal metastasis with mixed sclerotic and lucent lesions throughout the visualized skeletal system. Review of the MIP images confirms the above findings. CT ABDOMEN and PELVIS FINDINGS Hepatobiliary: Multiple low-attenuation peripherally enhancing lesions demonstrated throughout the liver consistent with diffuse hepatic metastasis. Portal veins are patent. Gallbladder and bile ducts are unremarkable. Pancreas: Unremarkable. No pancreatic ductal dilatation or surrounding inflammatory changes. Spleen: Normal in size without focal abnormality. Adrenals/Urinary Tract: Adrenal glands are unremarkable. Kidneys are normal, without renal calculi, focal lesion, or hydronephrosis. Bladder is unremarkable. Stomach/Bowel: Stomach is within normal limits. Appendix appears normal. No evidence of bowel wall thickening, distention, or inflammatory changes. Vascular/Lymphatic: Aortic atherosclerosis. No enlarged abdominal or pelvic lymph nodes. Reproductive: Uterus and bilateral adnexa are unremarkable. Other: Small amount of free fluid in the abdomen and pelvis, possibly reactive or may indicate peritoneal metastasis. Prominent upper abdominal varices. Edema in the subcutaneous fat throughout the abdominal wall. No free air.  Musculoskeletal: Diffuse skeletal metastasis with mixed sclerotic and lucent lesions for about the visualized skeletal elements. Review of the MIP images confirms the above findings. IMPRESSION: 1. No evidence of significant pulmonary embolus. 2. Moderate bilateral pleural effusions with basilar atelectasis. Patchy airspace infiltrates in the remaining lungs may represent edema or pneumonia. 3. Noncalcified nodules in the right middle lung and right lower lung, likely metastatic. 4. Diffuse  hepatic metastasis. 5. Diffuse skeletal metastasis. 6. Prominent upper abdominal varices. 7. Small amount of free fluid in the abdomen and pelvis, may be reactive or may indicate peritoneal metastasis. 8. Edema in the subcutaneous fat throughout the abdominal wall. Aortic Atherosclerosis (ICD10-I70.0). Electronically Signed   By: Lucienne Capers M.D.   On: 02/27/2020 19:40   DG Chest Portable 1 View  Result Date: 02/27/2020 CLINICAL DATA:  Shortness of breath EXAM: PORTABLE CHEST 1 VIEW COMPARISON:  None. FINDINGS: There is hyperinflation of the lungs compatible with COPD. Small left pleural effusion with left base atelectasis. Right lung clear. No acute bony abnormality. Multiple old healed right rib fractures. IMPRESSION: Small left pleural effusion with left base atelectasis. COPD. Electronically Signed   By: Rolm Baptise M.D.   On: 02/27/2020 17:56   ECHOCARDIOGRAM COMPLETE  Result Date: 02/28/2020    ECHOCARDIOGRAM REPORT   Patient Name:   Morgan Yu Date of Exam: 02/28/2020 Medical Rec #:  [id number]      Height: Accession #:    [id number]     Weight: Date of Birth:  May 20, 1940       BSA: Patient Age:    58 years       BP:           124/78 mmHg Patient Gender: F              HR:           100 bpm. Exam Location:  Inpatient Procedure: 2D Echo, Cardiac Doppler and Color Doppler Indications:    Dyspnea  History:        Patient has no prior history of Echocardiogram examinations.                 Signs/Symptoms:Dyspnea.  New metastatic breast cancer, bilateral                 pleural effusions.  Sonographer:    Dustin Flock Referring Phys: [phone] Gregory  1. Left ventricular ejection fraction, by estimation, is 65 to 70%. The left ventricle has normal function. The left ventricle has no regional wall motion abnormalities. Left ventricular diastolic parameters are consistent with Grade I diastolic dysfunction (impaired relaxation).  2. Right ventricular systolic function is normal. The right ventricular size is normal. There is normal pulmonary artery systolic pressure. The estimated right ventricular systolic pressure is 15.4 mmHg.  3. The mitral valve is normal in structure. No evidence of mitral valve regurgitation. No evidence of mitral stenosis.  4. The aortic valve is tricuspid. Aortic valve regurgitation is not visualized. No aortic stenosis is present.  5. The inferior vena cava is normal in size with greater than 50% respiratory variability, suggesting right atrial pressure of 3 mmHg.  6. Left pleural effusion noted. FINDINGS  Left Ventricle: Left ventricular ejection fraction, by estimation, is 65 to 70%. The left ventricle has normal function. The left ventricle has no regional wall motion abnormalities. The left ventricular internal cavity size was normal in size. There is  no left ventricular hypertrophy. Left ventricular diastolic parameters are consistent with Grade I diastolic dysfunction (impaired relaxation). Right Ventricle: The right ventricular size is normal. No increase in right ventricular wall thickness. Right ventricular systolic function is normal. There is normal pulmonary artery systolic pressure. The tricuspid regurgitant velocity is 2.66 m/s, and  with an assumed right atrial pressure of 3 mmHg, the estimated right ventricular systolic pressure is 00.8 mmHg. Left Atrium: Left atrial size was normal in  size. Right Atrium: Right atrial size was normal in size. Pericardium: Left  pleural effusion noted. There is no evidence of pericardial effusion. Mitral Valve: The mitral valve is normal in structure. Mild mitral annular calcification. No evidence of mitral valve regurgitation. No evidence of mitral valve stenosis. Tricuspid Valve: The tricuspid valve is normal in structure. Tricuspid valve regurgitation is trivial. Aortic Valve: The aortic valve is tricuspid. Aortic valve regurgitation is not visualized. No aortic stenosis is present. Aortic valve peak gradient measures 12.5 mmHg. Pulmonic Valve: The pulmonic valve was normal in structure. Pulmonic valve regurgitation is not visualized. Aorta: The aortic root is normal in size and structure. Venous: The inferior vena cava is normal in size with greater than 50% respiratory variability, suggesting right atrial pressure of 3 mmHg. IAS/Shunts: No atrial level shunt detected by color flow Doppler.  LEFT VENTRICLE PLAX 2D LVIDd:         3.70 cm  Diastology LVIDs:         2.40 cm  LV e' medial:    5.00 cm/s LV PW:         0.90 cm  LV E/e' medial:  13.0 LV IVS:        1.00 cm  LV e' lateral:   6.09 cm/s LVOT diam:     1.80 cm  LV E/e' lateral: 10.7 LV SV:         75 LVOT Area:     2.54 cm  RIGHT VENTRICLE RV Basal diam:  2.70 cm RV S prime:     18.50 cm/s TAPSE (M-mode): 3.5 cm LEFT ATRIUM             RIGHT ATRIUM LA diam:        2.80 cm RA Area:     11.60 cm LA Vol (A2C):   36.9 ml RA Volume:   24.70 ml LA Vol (A4C):   29.7 ml LA Biplane Vol: 33.8 ml  AORTIC VALVE AV Area (Vmax): 1.93 cm AV Vmax:        177.00 cm/s AV Peak Grad:   12.5 mmHg LVOT Vmax:      134.00 cm/s LVOT Vmean:     92.200 cm/s LVOT VTI:       0.294 m  AORTA Ao Root diam: 2.70 cm MITRAL VALVE                TRICUSPID VALVE MV Area (PHT): 5.66 cm     TR Peak grad:   28.3 mmHg MV Decel Time: 134 msec     TR Vmax:        266.00 cm/s MV E velocity: 64.90 cm/s MV A velocity: 109.00 cm/s  SHUNTS MV E/A ratio:  0.60         Systemic VTI:  0.29 m                              Systemic Diam: 1.80 cm Loralie Champagne MD Electronically signed by Loralie Champagne MD Signature Date/Time: 02/28/2020/6:28:13 PM    Final    [place] CORE BIOPSY (LYMPH NODES)  Result Date: 02/29/2020 INDICATION: 80 year old female with a history of breast carcinoma with metastatic disease. EXAM: IMAGE GUIDED BIOPSY OF LEFT CHEST WALL MASS/AXILLARY LYMPH NODE MEDICATIONS: None. ANESTHESIA/SEDATION: Moderate (conscious) sedation was employed during this procedure. A total of Versed 0.5 mg and Fentanyl 25 mcg was administered intravenously. Moderate Sedation Time: 10 minutes. The patient's level of consciousness and vital signs  were monitored continuously by radiology nursing throughout the procedure under my direct supervision. FLUOROSCOPY TIME:  Ultrasound COMPLICATIONS: None PROCEDURE: Informed written consent was obtained from the patient after a thorough discussion of the procedural risks, benefits and alternatives. All questions were addressed. Maximal Sterile Barrier Technique was utilized including caps, mask, sterile gowns, sterile gloves, sterile drape, hand hygiene and skin antiseptic. A timeout was performed prior to the initiation of the procedure Ultrasound survey was performed with images stored and sent to PACs. The left axillary region was prepped with chlorhexidine in a sterile fashion, and a sterile drape was applied covering the operative field. A sterile gown and sterile gloves were used for the procedure. Local anesthesia was provided with 1% Lidocaine. Ultrasound guidance was used to infiltrate the region with 1% lidocaine for local anesthesia. Four separate 16 gauge core biopsy were then acquired of the left axillary mass/lymph node using ultrasound guidance. Images were stored. Final image was stored after biopsy. Patient tolerated the procedure well and remained hemodynamically stable throughout. No complications were encountered and no significant blood loss was encounter IMPRESSION: Status post  ultrasound-guided left axillary mass biopsy. Signed, Dulcy Fanny. Dellia Nims, RPVI [facility] Electronically Signed   By: Corrie Mckusick D.O.   On: 02/29/2020 16:40   UE VENOUS DUPLEX ([facility] & [facility] 7 am - 7 pm)  Result Date: 02/27/2020 UPPER VENOUS STUDY  Indications: Swelling of LT forearm/wrist/hand. Limitations: Depth and size of vessels. Comparison Study: No prior studies available. Performing Technologist: Darlin Coco RDMS  Examination Guidelines: A complete evaluation includes B-mode imaging, spectral Doppler, color Doppler, and power Doppler as needed of all accessible portions of each vessel. Bilateral testing is considered an integral part of a complete examination. Limited examinations for reoccurring indications may be performed as noted.  Right Findings: +----------+------------+---------+-----------+----------+-------+ RIGHT     CompressiblePhasicitySpontaneousPropertiesSummary +----------+------------+---------+-----------+----------+-------+ Subclavian               Yes       Yes                      +----------+------------+---------+-----------+----------+-------+  Left Findings: +----------+------------+---------+-----------+----------+-------+ LEFT      CompressiblePhasicitySpontaneousPropertiesSummary +----------+------------+---------+-----------+----------+-------+ IJV           Full       Yes       Yes                      +----------+------------+---------+-----------+----------+-------+ Subclavian    Full       Yes       Yes                      +----------+------------+---------+-----------+----------+-------+ Axillary      Full       Yes       Yes                      +----------+------------+---------+-----------+----------+-------+ Brachial      Full       Yes       Yes                      +----------+------------+---------+-----------+----------+-------+ Radial        Full                                           +----------+------------+---------+-----------+----------+-------+  Ulnar         Full                                          +----------+------------+---------+-----------+----------+-------+ Cephalic      Full                                          +----------+------------+---------+-----------+----------+-------+ Basilic       Full                                          +----------+------------+---------+-----------+----------+-------+  Summary:  Right: No evidence of thrombosis in the subclavian.  Left: No evidence of deep vein thrombosis in the upper extremity. No evidence of superficial vein thrombosis in the upper extremity.  Indicental: Appearance of enlarged, hypoechoic lymph node with loss of hilar definition in the axilla.  *See table(s) above for measurements and observations.  Diagnosing physician: Harold Barban MD Electronically signed by Harold Barban MD on 02/27/2020 at 9:42:15 PM.    Final     ASSESSMENT AND PLAN: Metastatic breast cancer with pulmonary nodules, liver masses, and bone lesions -Discussed biopsy results of the chest wall/left axillary mass which were consistent with metastatic carcinoma.  Overall, morphology consistent with breast primary as expected.  -Discussed with the patient that we are awaiting the prognostic profile before we can determine treatment recommendations. -We again discussed that her metastatic breast cancer is not curable but is treatable. -Depending on prognostic profile, the patient may be a candidate for antiestrogen therapy. -We will arrange for outpatient follow-up to discuss the final biopsy results and discuss treatment options.   Hypercalcemia likely due to malignancy -Corrected calcium elevated on admission. -Status post pamidronate 02/28/2020. -Calcium level has now normalized.  We will continue to follow as an outpatient.   LOS: 6 days   Mikey Bussing, DNP, AGPCNP-BC,  AOCNP 03/04/20  Attending Note  I personally saw the patient, reviewed the chart and examined the patient. The plan of care was discussed with the patient and the admitting team. I agree with the assessment and plan as documented above. Thank you very much for the consultation. Discussed that based on prognostic profile, treatment recommendations will be decided. She will FU with me as planned on Thursday If prognostics available, if not we will move it to next Thursday. All questions were answered to the best of my knowledge.

## 2020-03-04 NOTE — Progress Notes (Signed)
PROGRESS NOTE    RUE VALLADARES  ZWC:[id number] DOB: 1940-04-05 DOA: 02/27/2020 PCP: Patient, No Pcp Per   Chief Complaint  Patient presents with  . Weakness    Brief Narrative: 80 year old female no significant past medical history but has not seen a PCP in many years presented to the ED with left breast mass and left arm swelling  As per report-she notes that she developed pruritus and later a lump involving the left breast a few months ago and then developed some swelling of the left arm over the past 2 weeks.  She has had 1 mammogram which she states was many years ago.  Never had a colonoscopy.  She denies any chest pain, fevers, chills, leg swelling, cough, or shortness of breath.  ED Course:Upon arrival to the ED, patient is found to be afebrile, saturating well on room air, and tachycardic to 110 with stable blood pressure.  EKG features sinus tachycardia and PAC.  Chemistry panel notable for alkaline phosphatase of 652, albumin 2.6, and AST 154.  CBC is unremarkable.  High-sensitivity troponin is 18 twice.  BNP is normal.  CTA chest is negative for PE but notable for moderate bilateral pleural effusions, patchy airspace opacities, right middle and right lower lobe nodules concerning for metastatic disease, as well as diffuse hepatic and skeletal metastases.  Ultrasound of the left upper extremity is negative for DVT but notable for enlarged axillary node.  Patient was given 20 mg IV Lasix in the ED.  Seen by hematology oncology noted to have hypercalcemia of malignancy.  Underwent left axillary biopsy 1/28. Pt calcium is much better after  Pamidronate. Doing well, off o2.Oncology PT,OT has seen and advised HHPT,3 IN 1. Patient lives alone and she wants to go to skilled nursing facility/ALF and [facility] working on placement. Patient still discussing with family about going to home versus facility  Subjective: Seen this morning has no complaint.  No pain.  She would like to hear from  oncology  Assessment & Plan:  Primary breast cancer with mets to  axillary and cervical lymphnodes and pulmonary nodules, liver masses and bone lesions concerning for metastatic disease there: left axillary lymph node biopsy done 1/28 resulted 1/31- prognostic markers pending.MRI negative for brain mets.  I have sent message to oncology regarding further plan.   Hypercalcemia of malignancy :  S/p pamidronate on 1/27.  Follow-up calcium level outpatient with oncology follow-up.  Hypokalemia: Replaced Dehydration/hyponatremia/mild metabolic acidosis in the setting of poor oral intake, encourage oral hydration.  Improved.  Hypoglycemia: Resolved  Elevated LFTs from liver metastasis.  Her LFTs were up, advise outpatient follow-up.   Moderate bilateral pleural effusion with basilar atelectasis.Pleural effusion much better with Lasix.  OfF Lasix.  She is on room air no shortness of breath.  Acute pulmonary insufficiency patient was needing 2 L nasal cannula briefly but has resolved.  Elevated troponin subtle increase no chest pain.  Echo shows EF 65 to 70% no regional WMA likely demand mismatch from pleural effusion Sciatica on rt leg- cvhronic Has not seen doctor in many years. Deconditioning in the setting of multiple comorbidities.  Continue PT OT, patient is requesting to go to skilled nursing facility as she lives alone and not much help at home  Goals of care palliative care is following closely.  Status changed to DNR after discussion with patient and POA.  Overall prognosis remains poor pending biopsy result -and plan is for outpatient follow-up with oncology to discuss more once biopsy is  back.    Nutrition Status: Severe protein calorie malnutrition: pt has evidence of severe muscle and fat loss, cachexia, see below Nutrition Problem: Severe Malnutrition Etiology: chronic illness (cancer) Signs/Symptoms: severe muscle depletion,severe fat depletion Interventions: Ensure Enlive (each  supplement provides 350kcal and 20 grams of protein),Magic cup Cont remeron.  Nutrition: Diet Order            Diet regular Room service appropriate? Yes; Fluid consistency: Thin  Diet effective now                 Body mass index is 17.22 kg/m.  Nutrition: Diet Order            Diet regular Room service appropriate? Yes; Fluid consistency: Thin  Diet effective now               Pt's Body mass index is 17.22 kg/m.  Pressure Ulcer: Pressure Injury 02/28/20 Sacrum Left Stage 1 -  Intact skin with non-blanchable redness of a localized area usually over a bony prominence. redness (Active)  02/28/20 1747  Location: Sacrum  Location Orientation: Left  Staging: Stage 1 -  Intact skin with non-blanchable redness of a localized area usually over a bony prominence.  Wound Description (Comments): redness  Present on Admission: Yes   DVT prophylaxis: SCDs Start: 02/27/20 2048 Code Status:   Code Status: DNR  Family Communication: plan of care was discussed with patient. Cousin updated by RN Status is: Inpatient Remains inpatient appropriate because:Inpatient level of care appropriate due to severity of illness Dispo: The patient is from: Home              Anticipated d/c is to: snf vs HH              Anticipated d/c date is: ocne bed available              Patient currently is medically stable to d/c.   Difficult to place patient ;No  Consultants:see note  Procedures:see note  Culture/Microbiology No results found for: SDES, SPECREQUEST, CULT, REPTSTATUS  Other culture-see note  Medications: Scheduled Meds: . feeding supplement  237 mL Oral TID BM  . mirtazapine  15 mg Oral QHS  . sodium chloride flush  3 mL Intravenous Q12H   Continuous Infusions: . sodium chloride      Antimicrobials: Anti-infectives (From admission, onward)   None     Objective: Vitals: Today's Vitals   03/03/20 2245 03/04/20 0617 03/04/20 0705 03/04/20 0918  BP:  138/65  132/63   Pulse:  (!) 103  96  Resp:  19  20  Temp:  98.5 F (36.9 C)  97.9 F (36.6 C)  TempSrc:    Oral  SpO2:  98%  94%  Weight:      Height:      PainSc: Asleep  0-No pain     Intake/Output Summary (Last 24 hours) at 03/04/2020 1344 Last data filed at 03/04/2020 1300 Gross per 24 hour  Intake 163 ml  Output 101 ml  Net 62 ml   Filed Weights   03/01/20 2042 03/02/20 1200 03/02/20 2114  Weight: 42 kg 42 kg 40 kg   Weight change:   Intake/Output from previous day: 01/31 0701 - 02/01 0700 In: 523 [P.O.:520; I.V.:3] Out: 101 [Urine:100; Stool:1] Intake/Output this shift: Total I/O In: 3 [I.V.:3] Out: -  Filed Weights   03/01/20 2042 03/02/20 1200 03/02/20 2114  Weight: 42 kg 42 kg 40 kg    Examination: General exam:  AAOx3, ill frail elderly appears older than stated age HEENT:Oral mucosa moist, Ear/Nose WNL grossly, dentition normal. Respiratory system: bilaterally clear,no wheezing or crackles,no use of accessory muscle Cardiovascular system: S1 & S2 +, No JVD,. Gastrointestinal system: Abdomen soft, NT,ND, BS+ Nervous System:Alert, awake, moving extremities and grossly nonfocal Extremities: No edema, distal peripheral pulses palpable.  Skin: No rashes,no icterus.  Left breast mass+. MSK: thin muscle bulk,tone, powerr   Data Reviewed: I have personally reviewed following labs and imaging studies CBC: Recent Labs  Lab 02/27/20 1537 02/28/20 0443 02/29/20 0515 03/01/20 0323  WBC 9.4 9.2 8.4 7.0  HGB 13.5 12.6 11.9* 13.0  HCT 40.9 38.0 37.7 38.6  MCV 83.1 83.3 84.3 82.1  PLT 394 349 362 213   Basic Metabolic Panel: Recent Labs  Lab 02/27/20 1537 02/28/20 0443 02/29/20 0515 03/01/20 0323  NA 134* 134* 136 139  K 4.1 3.7 3.8 3.3*  CL 97* 97* 98 103  CO2 21* 21* 21* 21*  GLUCOSE 110* 70 63* 127*  BUN 31* 28* 32* 25*  CREATININE 0.95 0.89 0.91 0.75  CALCIUM 11.1* 10.7* 11.1* 9.6   GFR: Estimated Creatinine Clearance: 36 mL/min (by C-G formula based on SCr  of 0.75 mg/dL). Liver Function Tests: Recent Labs  Lab 02/27/20 1721 02/28/20 0443 02/29/20 0515 03/01/20 0323  AST 154* 143* 143* 252*  ALT 30 27 26  49*  ALKPHOS 652* 590* 583* 534*  BILITOT 0.6 1.2 1.4* 0.8  PROT 6.4* 5.7* 5.7* 5.3*  ALBUMIN 2.6* 2.2* 2.3* 2.1*   No results for input(s): LIPASE, AMYLASE in the last 168 hours. No results for input(s): AMMONIA in the last 168 hours. Coagulation Profile: Recent Labs  Lab 02/29/20 0515  INR 1.0   Cardiac Enzymes: No results for input(s): CKTOTAL, CKMB, CKMBINDEX, TROPONINI in the last 168 hours. BNP (last 3 results) No results for input(s): PROBNP in the last 8760 hours. HbA1C: No results for input(s): HGBA1C in the last 72 hours. CBG: Recent Labs  Lab 02/29/20 0749 02/29/20 0823 02/29/20 1544  GLUCAP 55* 140* 91   Lipid Profile: No results for input(s): CHOL, HDL, LDLCALC, TRIG, CHOLHDL, LDLDIRECT in the last 72 hours. Thyroid Function Tests: No results for input(s): TSH, T4TOTAL, FREET4, T3FREE, THYROIDAB in the last 72 hours. Anemia Panel: No results for input(s): VITAMINB12, FOLATE, FERRITIN, TIBC, IRON, RETICCTPCT in the last 72 hours. Sepsis Labs: No results for input(s): PROCALCITON, LATICACIDVEN in the last 168 hours.  Recent Results (from the past 240 hour(s))  SARS Coronavirus 2 by RT PCR (hospital order, performed in [facility] hospital lab) Nasopharyngeal Nasopharyngeal Swab     Status: None   Collection Time: 02/27/20  9:07 PM   Specimen: Nasopharyngeal Swab  Result Value Ref Range Status   SARS Coronavirus 2 NEGATIVE NEGATIVE Final    Comment: (NOTE) SARS-CoV-2 target nucleic acids are NOT DETECTED.  The SARS-CoV-2 RNA is generally detectable in upper and lower respiratory specimens during the acute phase of infection. The lowest concentration of SARS-CoV-2 viral copies this assay can detect is 250 copies / mL. A negative result does not preclude SARS-CoV-2 infection and should not be used as  the sole basis for treatment or other patient management decisions.  A negative result may occur with improper specimen collection / handling, submission of specimen other than nasopharyngeal swab, presence of viral mutation(s) within the areas targeted by this assay, and inadequate number of viral copies (<250 copies / mL). A negative result must be combined with clinical observations, patient  history, and epidemiological information.  Fact Sheet for Patients:   StrictlyIdeas.no  Fact Sheet for Healthcare Providers: BankingDealers.co.za  This test is not yet approved or  cleared by the [place] FDA and has been authorized for detection and/or diagnosis of SARS-CoV-2 by FDA under an Emergency Use Authorization (EUA).  This EUA will remain in effect (meaning this test can be used) for the duration of the COVID-19 declaration under Section 564(b)(1) of the Act, 21 U.S.C. section 360bbb-3(b)(1), unless the authorization is terminated or revoked sooner.  Performed at [facility], [place] [address] [place], [place] [address]      Radiology Studies: No results found.   LOS: 6 days   Antonieta Pert, MD [facility]  03/04/2020, 1:44 PM

## 2020-03-05 DIAGNOSIS — C799 Secondary malignant neoplasm of unspecified site: Secondary | ICD-10-CM | POA: Diagnosis not present

## 2020-03-05 DIAGNOSIS — J90 Pleural effusion, not elsewhere classified: Secondary | ICD-10-CM | POA: Diagnosis not present

## 2020-03-05 MED ORDER — MIRTAZAPINE 15 MG PO TABS: 15.0000 mg | ORAL_TABLET | Freq: Every day | ORAL | 0 refills | Status: DC

## 2020-03-05 MED ORDER — ENSURE ENLIVE PO LIQD: 237.0000 mL | Freq: Three times a day (TID) | ORAL | 12 refills | Status: DC

## 2020-03-05 NOTE — Discharge Summary (Signed)
Physician Discharge Summary  Morgan Yu RJJ:[id number] DOB: 06/18/1940 DOA: 02/27/2020  PCP: Patient, No Pcp Per  Admit date: 02/27/2020 Discharge date: 03/05/2020  Admitted From: home Disposition:  hh  Recommendations for Outpatient Follow-up:  Follow up with PCP in 1-2 weeks Please obtain BMP/CBC in one week Please follow up on the following pending results:  Home Health:yes  Equipment/Devices: yes  Discharge Condition: Stable Code Status:   Code Status: DNR Diet recommendation:  Diet Order             Diet general           Diet regular Room service appropriate? Yes; Fluid consistency: Thin  Diet effective now                    Brief/Interim Summary: 80 year old female no significant past medical history but has not seen a PCP in many years presented to the ED with left breast mass and left arm swelling  As per report-she notes that she developed pruritus and later a lump involving the left breast a few months ago and then developed some swelling of the left arm over the past 2 weeks.  She has had 1 mammogram which she states was many years ago.  Never had a colonoscopy.  She denies any chest pain, fevers, chills, leg swelling, cough, or shortness of breath.   ED Course:Upon arrival to the ED, patient is found to be afebrile, saturating well on room air, and tachycardic to 110 with stable blood pressure.  EKG features sinus tachycardia and PAC.  Chemistry panel notable for alkaline phosphatase of 652, albumin 2.6, and AST 154.  CBC is unremarkable.  High-sensitivity troponin is 18 twice.  BNP is normal.  CTA chest is negative for PE but notable for moderate bilateral pleural effusions, patchy airspace opacities, right middle and right lower lobe nodules concerning for metastatic disease, as well as diffuse hepatic and skeletal metastases.  Ultrasound of the left upper extremity is negative for DVT but notable for enlarged axillary node.  Patient was given 20 mg IV Lasix in  the ED.  Seen by hematology oncology noted to have hypercalcemia of malignancy.  Underwent left axillary biopsy 1/28. Pt calcium is much better after  Pamidronate. Doing well, off o2.Oncology PT,OT has seen and advised HHPT,3 IN 1. Patient lives alone and she wants to go to skilled nursing facility/ALF and [facility] working on placement. Patient still discussing with family about going to home versus facility She has decided to go home with home health and outpatient follow-up with oncology Home health has been set up  Discharge Diagnoses:    Left breast cancer with bone lesions concerning for metastatic disease : left axillary lymph node biopsy done 1/28 resulted 1/31- prognostic markers pending.MRI negative for brain mets.  I have sent message to oncology regarding further plan.  They are going to follow-up as outpatient.   Hypercalcemia of malignancy :  S/p pamidronate on 1/27.  Follow-up calcium level outpatient with oncology follow-up.   Hypokalemia: Replaced Dehydration/hyponatremia/mild metabolic acidosis in the setting of poor oral intake, encourage oral hydration.  Improved.   Hypoglycemia: Resolved   Elevated LFTs from liver metastasis.  Her LFTs were up, advise outpatient follow-up.    Moderate bilateral pleural effusion with basilar atelectasis.Pleural effusion much better with Lasix.  OfF Lasix.  She is on room air no shortness of breath.   Acute pulmonary insufficiency patient was needing 2 L nasal cannula briefly but has resolved.  She is doing well respiratory wise. Elevated troponin subtle increase no chest pain.  Echo shows EF 65 to 70% no regional WMA likely demand mismatch from pleural effusion Sciatica on rt leg- cvhronic Has not seen doctor in many years. Deconditioning in the setting of multiple comorbidities.  Continue PT OT, plans for discharge to home and home health.    Goals of care palliative care is following closely.  Status changed to DNR after discussion with  patient and POA.  Outpatient palliative follow-up/oncology follow-up  Nutrition Status: Severe protein calorie malnutrition: pt has evidence of severe muscle and fat loss, cachexia, see below Nutrition Problem: Severe Malnutrition Etiology: chronic illness (cancer) Signs/Symptoms: severe muscle depletion,severe fat depletion Interventions: Ensure Enlive (each supplement provides 350kcal and 20 grams of protein),Magic cup  Pressure Ulcer: Pressure Injury 02/28/20 Sacrum Left Stage 1 -  Intact skin with non-blanchable redness of a localized area usually over a bony prominence. redness (Active)  02/28/20 1747  Location: Sacrum  Location Orientation: Left  Staging: Stage 1 -  Intact skin with non-blanchable redness of a localized area usually over a bony prominence.  Wound Description (Comments): redness  Present on Admission: Yes    Consults: Oncology PMT  Subjective: AAOX,3 not in pain, WANTS TO GO HOME with [facility]  Discharge Exam: Vitals:   03/05/20 0954 03/05/20 1100  BP: 123/70 136/79  Pulse: (!) 101 87  Resp: 18 19  Temp: 98.3 F (36.8 C)   SpO2: 95% 93%   General: Pt is alert, awake, not in acute distress Cardiovascular: RRR, S1/S2 +, no rubs, no gallops Respiratory: CTA bilaterally, no wheezing, no rhonchi Abdominal: Soft, NT, ND, bowel sounds + Extremities: no edema, no cyanosis  Discharge Instructions  Discharge Instructions     Diet general   Complete by: As directed    Discharge instructions   Complete by: As directed    Please follow-up with oncology office for appointment to discuss further plan of care  Please call call MD or return to ER for similar or worsening recurring problem that brought you to hospital or if any fever,nausea/vomiting,abdominal pain, uncontrolled pain, chest pain,  shortness of breath or any other alarming symptoms.  Please follow-up your doctor as instructed in a week time and call the office for appointment.  Please avoid alcohol,  smoking, or any other illicit substance and maintain healthy habits including taking your regular medications as prescribed.  You were cared for by a hospitalist during your hospital stay. If you have any questions about your discharge medications or the care you received while you were in the hospital after you are discharged, you can call the unit and ask to speak with the hospitalist on call if the hospitalist that took care of you is not available.  Once you are discharged, your primary care physician will handle any further medical issues. Please note that NO REFILLS for any discharge medications will be authorized once you are discharged, as it is imperative that you return to your primary care physician (or establish a relationship with a primary care physician if you do not have one) for your aftercare needs so that they can reassess your need for medications and monitor your lab values   Increase activity slowly   Complete by: As directed    No wound care   Complete by: As directed       Allergies as of 03/05/2020   No Known Allergies      Medication List  TAKE these medications    acetaminophen 500 MG tablet Commonly known as: TYLENOL Take 500 mg by mouth every 6 (six) hours as needed for moderate pain.   feeding supplement Liqd Take 237 mLs by mouth 3 (three) times daily between meals.   mirtazapine 15 MG tablet Commonly known as: REMERON Take 1 tablet (15 mg total) by mouth at bedtime.   PROBIOTIC PO Take 1 capsule by mouth daily.               Durable Medical Equipment  (From admission, onward)           Start     Ordered   03/04/20 1650  For home use only DME Walker rolling  Once       Question Answer Comment  Walker: With [place] Wheels   Patient needs a walker to treat with the following condition Decreased functional mobility and endurance      03/04/20 1650   03/01/20 1312  For home use only DME 3 n 1  Once        03/01/20 1311             Follow-up Information     [facility]. Call.   Why: to schedule new patient appointment  to make an appointment to establish as new patient - 3 numbers have been provided for primary providers - if you need additional providers, call the member number on you [facility] card Contact information: [address] [place], [place] [address] [place]. Call.   Why: to make an appointment to establish as new patient - 3 numbers have been provided for primary providers - if you need additional providers, call the member number on you [employer] card Contact information: [place] [address] [place], [place]        [place]. Call.   Why: Call to schedule a new patient appointment - 3 choices have been provided to establish with a primary care provider - please call member services on you [facility] card for additional choices Contact information: [address] [place], [place], [facility] Follow up.   Specialty: Home Health Services Why: the office will call to schedule therapy visits Contact information: [address] [place] [address] [phone]         [name], MD. Call.   Specialty: Hematology and Oncology Why: for f/u on thursday Contact information: [place] [address] [phone]                No Known Allergies  The results of significant diagnostics from this hospitalization (including imaging, microbiology, ancillary and laboratory) are listed below for reference.    Microbiology: Recent Results (from the past 240 hour(s))  SARS Coronavirus 2 by RT PCR (hospital order, performed in [facility] hospital lab) Nasopharyngeal Nasopharyngeal Swab     Status: None   Collection Time: 02/27/20  9:07 PM   Specimen: Nasopharyngeal Swab  Result Value Ref Range Status   SARS Coronavirus 2 NEGATIVE  NEGATIVE Final    Comment: (NOTE) SARS-CoV-2 target nucleic acids are NOT DETECTED.  The SARS-CoV-2 RNA is generally detectable in upper and lower respiratory specimens during the acute phase of infection. The lowest concentration of SARS-CoV-2 viral copies this assay can detect is 250 copies / mL. A negative result  does not preclude SARS-CoV-2 infection and should not be used as the sole basis for treatment or other patient management decisions.  A negative result may occur with improper specimen collection / handling, submission of specimen other than nasopharyngeal swab, presence of viral mutation(s) within the areas targeted by this assay, and inadequate number of viral copies (<250 copies / mL). A negative result must be combined with clinical observations, patient history, and epidemiological information.  Fact Sheet for Patients:   StrictlyIdeas.no  Fact Sheet for Healthcare Providers: BankingDealers.co.za  This test is not yet approved or  cleared by the [place] FDA and has been authorized for detection and/or diagnosis of SARS-CoV-2 by FDA under an Emergency Use Authorization (EUA).  This EUA will remain in effect (meaning this test can be used) for the duration of the COVID-19 declaration under Section 564(b)(1) of the Act, 21 U.S.C. section 360bbb-3(b)(1), unless the authorization is terminated or revoked sooner.  Performed at [facility], [place] [address] [place], [place] [address]     Procedures/Studies: DG Chest 2 View  Result Date: 02/29/2020 CLINICAL DATA:  Pleural effusions, weakness EXAM: CHEST - 2 VIEW COMPARISON:  Radiograph 02/27/2020 FINDINGS: Normal cardiac silhouette. Small LEFT effusion. Lungs are hyperinflated. Chronic bronchitic markings. No pneumothorax. No focal consolidation. Severe degenerative changes in the glenoid fossa. IMPRESSION: 1. Small LEFT effusion. 2. Hyperinflated lungs and  chronic bronchitic markings. Electronically Signed   By: Suzy Bouchard M.D.   On: 02/29/2020 12:38   CT Angio Chest PE W and/or Wo Contrast  Result Date: 02/27/2020 CLINICAL DATA:  Acute abdominal pain. Elevated liver function test. Suspected breast cancer. Query metastasis. Query shortness of breath. Pulmonary embolus suspected with high probability. EXAM: CT ANGIOGRAPHY CHEST CT ABDOMEN AND PELVIS WITH CONTRAST TECHNIQUE: Multidetector CT imaging of the chest was performed using the standard protocol during bolus administration of intravenous contrast. Multiplanar CT image reconstructions and MIPs were obtained to evaluate the vascular anatomy. Multidetector CT imaging of the abdomen and pelvis was performed using the standard protocol during bolus administration of intravenous contrast. CONTRAST:  174mL OMNIPAQUE IOHEXOL 350 MG/ML SOLN COMPARISON:  Chest radiograph 02/27/2020 FINDINGS: CTA CHEST FINDINGS Cardiovascular: Satisfactory opacification of the pulmonary arteries to the segmental level. No evidence of pulmonary embolism. Normal heart size. No pericardial effusion. Normal caliber thoracic aorta. No aortic dissection. Mediastinum/Nodes: Esophagus is decompressed. No significant lymphadenopathy in the mediastinum. Multiple enlarged lymph nodes in the left axilla, measuring up to 2.5 x 3.3 cm in diameter, likely metastatic. Lungs/Pleura: Moderate bilateral pleural effusions. Atelectasis in the lung bases with patchy airspace infiltrates in the remaining lungs. This could represent edema or pneumonia. Noncalcified nodule in the right middle lung measuring 9 mm in diameter, likely metastatic. Additional smaller nodules demonstrated in the right lower lung. Evaluation is limited due to motion artifact. Musculoskeletal: Diffuse skeletal metastasis with mixed sclerotic and lucent lesions throughout the visualized skeletal system. Review of the MIP images confirms the above findings. CT ABDOMEN and PELVIS  FINDINGS Hepatobiliary: Multiple low-attenuation peripherally enhancing lesions demonstrated throughout the liver consistent with diffuse hepatic metastasis. Portal veins are patent. Gallbladder and bile ducts are unremarkable. Pancreas: Unremarkable. No pancreatic ductal dilatation or surrounding inflammatory changes. Spleen: Normal in size without focal abnormality. Adrenals/Urinary Tract: Adrenal glands are unremarkable. Kidneys are normal, without renal calculi, focal lesion, or hydronephrosis. Bladder is unremarkable. Stomach/Bowel: Stomach is within normal limits. Appendix appears normal. No evidence of bowel wall thickening, distention, or inflammatory changes. Vascular/Lymphatic: Aortic atherosclerosis. No enlarged  abdominal or pelvic lymph nodes. Reproductive: Uterus and bilateral adnexa are unremarkable. Other: Small amount of free fluid in the abdomen and pelvis, possibly reactive or may indicate peritoneal metastasis. Prominent upper abdominal varices. Edema in the subcutaneous fat throughout the abdominal wall. No free air. Musculoskeletal: Diffuse skeletal metastasis with mixed sclerotic and lucent lesions for about the visualized skeletal elements. Review of the MIP images confirms the above findings. IMPRESSION: 1. No evidence of significant pulmonary embolus. 2. Moderate bilateral pleural effusions with basilar atelectasis. Patchy airspace infiltrates in the remaining lungs may represent edema or pneumonia. 3. Noncalcified nodules in the right middle lung and right lower lung, likely metastatic. 4. Diffuse hepatic metastasis. 5. Diffuse skeletal metastasis. 6. Prominent upper abdominal varices. 7. Small amount of free fluid in the abdomen and pelvis, may be reactive or may indicate peritoneal metastasis. 8. Edema in the subcutaneous fat throughout the abdominal wall. Aortic Atherosclerosis (ICD10-I70.0). Electronically Signed   By: Lucienne Capers M.D.   On: 02/27/2020 19:40   MR Brain W Wo  Contrast  Result Date: 02/28/2020 CLINICAL DATA:  Breast cancer, staging EXAM: MRI HEAD WITHOUT AND WITH CONTRAST TECHNIQUE: Multiplanar, multiecho pulse sequences of the brain and surrounding structures were obtained without and with intravenous contrast. CONTRAST:  28mL GADAVIST GADOBUTROL 1 MMOL/ML IV SOLN COMPARISON:  None. FINDINGS: Brain: There is no acute infarction or intracranial hemorrhage. There is no intracranial mass, mass effect, or edema. There is no hydrocephalus or extra-axial fluid collection. Prominence of the ventricles and sulci reflects minor generalized parenchymal volume loss. Patchy foci of T2 hyperintensity in the supratentorial white matter are nonspecific but probably reflect mild chronic microvascular ischemic changes. Punctate focus of susceptibility in the right temporal subcortical white matter likely reflects chronic microhemorrhage. No abnormal enhancement. Vascular: Major vessel flow voids at the skull base are preserved. Skull and upper cervical spine: Abnormal T1 marrow signal in the visualized cervical spine. Patchy abnormal marrow signal the calvarium and skull base. Sinuses/Orbits: Paranasal sinuses are aerated. Orbits are unremarkable. Other: Sella is unremarkable.  Mastoid air cells are clear. IMPRESSION: No evidence of intracranial metastatic disease. Diffuse osseous metastatic disease. Mild chronic microvascular ischemic changes. Electronically Signed   By: Macy Mis M.D.   On: 02/28/2020 13:57   CT ABDOMEN PELVIS W CONTRAST  Result Date: 02/27/2020 CLINICAL DATA:  Acute abdominal pain. Elevated liver function test. Suspected breast cancer. Query metastasis. Query shortness of breath. Pulmonary embolus suspected with high probability. EXAM: CT ANGIOGRAPHY CHEST CT ABDOMEN AND PELVIS WITH CONTRAST TECHNIQUE: Multidetector CT imaging of the chest was performed using the standard protocol during bolus administration of intravenous contrast. Multiplanar CT image  reconstructions and MIPs were obtained to evaluate the vascular anatomy. Multidetector CT imaging of the abdomen and pelvis was performed using the standard protocol during bolus administration of intravenous contrast. CONTRAST:  137mL OMNIPAQUE IOHEXOL 350 MG/ML SOLN COMPARISON:  Chest radiograph 02/27/2020 FINDINGS: CTA CHEST FINDINGS Cardiovascular: Satisfactory opacification of the pulmonary arteries to the segmental level. No evidence of pulmonary embolism. Normal heart size. No pericardial effusion. Normal caliber thoracic aorta. No aortic dissection. Mediastinum/Nodes: Esophagus is decompressed. No significant lymphadenopathy in the mediastinum. Multiple enlarged lymph nodes in the left axilla, measuring up to 2.5 x 3.3 cm in diameter, likely metastatic. Lungs/Pleura: Moderate bilateral pleural effusions. Atelectasis in the lung bases with patchy airspace infiltrates in the remaining lungs. This could represent edema or pneumonia. Noncalcified nodule in the right middle lung measuring 9 mm in diameter, likely metastatic. Additional smaller  nodules demonstrated in the right lower lung. Evaluation is limited due to motion artifact. Musculoskeletal: Diffuse skeletal metastasis with mixed sclerotic and lucent lesions throughout the visualized skeletal system. Review of the MIP images confirms the above findings. CT ABDOMEN and PELVIS FINDINGS Hepatobiliary: Multiple low-attenuation peripherally enhancing lesions demonstrated throughout the liver consistent with diffuse hepatic metastasis. Portal veins are patent. Gallbladder and bile ducts are unremarkable. Pancreas: Unremarkable. No pancreatic ductal dilatation or surrounding inflammatory changes. Spleen: Normal in size without focal abnormality. Adrenals/Urinary Tract: Adrenal glands are unremarkable. Kidneys are normal, without renal calculi, focal lesion, or hydronephrosis. Bladder is unremarkable. Stomach/Bowel: Stomach is within normal limits. Appendix  appears normal. No evidence of bowel wall thickening, distention, or inflammatory changes. Vascular/Lymphatic: Aortic atherosclerosis. No enlarged abdominal or pelvic lymph nodes. Reproductive: Uterus and bilateral adnexa are unremarkable. Other: Small amount of free fluid in the abdomen and pelvis, possibly reactive or may indicate peritoneal metastasis. Prominent upper abdominal varices. Edema in the subcutaneous fat throughout the abdominal wall. No free air. Musculoskeletal: Diffuse skeletal metastasis with mixed sclerotic and lucent lesions for about the visualized skeletal elements. Review of the MIP images confirms the above findings. IMPRESSION: 1. No evidence of significant pulmonary embolus. 2. Moderate bilateral pleural effusions with basilar atelectasis. Patchy airspace infiltrates in the remaining lungs may represent edema or pneumonia. 3. Noncalcified nodules in the right middle lung and right lower lung, likely metastatic. 4. Diffuse hepatic metastasis. 5. Diffuse skeletal metastasis. 6. Prominent upper abdominal varices. 7. Small amount of free fluid in the abdomen and pelvis, may be reactive or may indicate peritoneal metastasis. 8. Edema in the subcutaneous fat throughout the abdominal wall. Aortic Atherosclerosis (ICD10-I70.0). Electronically Signed   By: Lucienne Capers M.D.   On: 02/27/2020 19:40   DG Chest Portable 1 View  Result Date: 02/27/2020 CLINICAL DATA:  Shortness of breath EXAM: PORTABLE CHEST 1 VIEW COMPARISON:  None. FINDINGS: There is hyperinflation of the lungs compatible with COPD. Small left pleural effusion with left base atelectasis. Right lung clear. No acute bony abnormality. Multiple old healed right rib fractures. IMPRESSION: Small left pleural effusion with left base atelectasis. COPD. Electronically Signed   By: Rolm Baptise M.D.   On: 02/27/2020 17:56   ECHOCARDIOGRAM COMPLETE  Result Date: 02/28/2020    ECHOCARDIOGRAM REPORT   Patient Name:   FUSAYE WACHTEL  Date of Exam: 02/28/2020 Medical Rec #:  [id number]      Height: Accession #:    [id number]     Weight: Date of Birth:  [birth year]       BSA: Patient Age:    64 years       BP:           124/78 mmHg Patient Gender: F              HR:           100 bpm. Exam Location:  Inpatient Procedure: 2D Echo, Cardiac Doppler and Color Doppler Indications:    Dyspnea  History:        Patient has no prior history of Echocardiogram examinations.                 Signs/Symptoms:Dyspnea. New metastatic breast cancer, bilateral                 pleural effusions.  Sonographer:    Dustin Flock Referring Phys: [phone] [place]  1. Left ventricular ejection fraction, by estimation, is 65 to 70%. The left ventricle  has normal function. The left ventricle has no regional wall motion abnormalities. Left ventricular diastolic parameters are consistent with Grade I diastolic dysfunction (impaired relaxation).  2. Right ventricular systolic function is normal. The right ventricular size is normal. There is normal pulmonary artery systolic pressure. The estimated right ventricular systolic pressure is 74.2 mmHg.  3. The mitral valve is normal in structure. No evidence of mitral valve regurgitation. No evidence of mitral stenosis.  4. The aortic valve is tricuspid. Aortic valve regurgitation is not visualized. No aortic stenosis is present.  5. The inferior vena cava is normal in size with greater than 50% respiratory variability, suggesting right atrial pressure of 3 mmHg.  6. Left pleural effusion noted. FINDINGS  Left Ventricle: Left ventricular ejection fraction, by estimation, is 65 to 70%. The left ventricle has normal function. The left ventricle has no regional wall motion abnormalities. The left ventricular internal cavity size was normal in size. There is  no left ventricular hypertrophy. Left ventricular diastolic parameters are consistent with Grade I diastolic dysfunction (impaired relaxation). Right Ventricle: The  right ventricular size is normal. No increase in right ventricular wall thickness. Right ventricular systolic function is normal. There is normal pulmonary artery systolic pressure. The tricuspid regurgitant velocity is 2.66 m/s, and  with an assumed right atrial pressure of 3 mmHg, the estimated right ventricular systolic pressure is 59.5 mmHg. Left Atrium: Left atrial size was normal in size. Right Atrium: Right atrial size was normal in size. Pericardium: Left pleural effusion noted. There is no evidence of pericardial effusion. Mitral Valve: The mitral valve is normal in structure. Mild mitral annular calcification. No evidence of mitral valve regurgitation. No evidence of mitral valve stenosis. Tricuspid Valve: The tricuspid valve is normal in structure. Tricuspid valve regurgitation is trivial. Aortic Valve: The aortic valve is tricuspid. Aortic valve regurgitation is not visualized. No aortic stenosis is present. Aortic valve peak gradient measures 12.5 mmHg. Pulmonic Valve: The pulmonic valve was normal in structure. Pulmonic valve regurgitation is not visualized. Aorta: The aortic root is normal in size and structure. Venous: The inferior vena cava is normal in size with greater than 50% respiratory variability, suggesting right atrial pressure of 3 mmHg. IAS/Shunts: No atrial level shunt detected by color flow Doppler.  LEFT VENTRICLE PLAX 2D LVIDd:         3.70 cm  Diastology LVIDs:         2.40 cm  LV e' medial:    5.00 cm/s LV PW:         0.90 cm  LV E/e' medial:  13.0 LV IVS:        1.00 cm  LV e' lateral:   6.09 cm/s LVOT diam:     1.80 cm  LV E/e' lateral: 10.7 LV SV:         75 LVOT Area:     2.54 cm  RIGHT VENTRICLE RV Basal diam:  2.70 cm RV S prime:     18.50 cm/s TAPSE (M-mode): 3.5 cm LEFT ATRIUM             RIGHT ATRIUM LA diam:        2.80 cm RA Area:     11.60 cm LA Vol (A2C):   36.9 ml RA Volume:   24.70 ml LA Vol (A4C):   29.7 ml LA Biplane Vol: 33.8 ml  AORTIC VALVE AV Area (Vmax): 1.93  cm AV Vmax:        177.00 cm/s AV Peak Grad:  12.5 mmHg LVOT Vmax:      134.00 cm/s LVOT Vmean:     92.200 cm/s LVOT VTI:       0.294 m  AORTA Ao Root diam: 2.70 cm MITRAL VALVE                TRICUSPID VALVE MV Area (PHT): 5.66 cm     TR Peak grad:   28.3 mmHg MV Decel Time: 134 msec     TR Vmax:        266.00 cm/s MV E velocity: 64.90 cm/s MV A velocity: 109.00 cm/s  SHUNTS MV E/A ratio:  0.60         Systemic VTI:  0.29 m                             Systemic Diam: 1.80 cm Loralie Champagne MD Electronically signed by Loralie Champagne MD Signature Date/Time: 02/28/2020/6:28:13 PM    Final    [place] CORE BIOPSY (LYMPH NODES)  Result Date: 02/29/2020 INDICATION: 80 year old female with a history of breast carcinoma with metastatic disease. EXAM: IMAGE GUIDED BIOPSY OF LEFT CHEST WALL MASS/AXILLARY LYMPH NODE MEDICATIONS: None. ANESTHESIA/SEDATION: Moderate (conscious) sedation was employed during this procedure. A total of Versed 0.5 mg and Fentanyl 25 mcg was administered intravenously. Moderate Sedation Time: 10 minutes. The patient's level of consciousness and vital signs were monitored continuously by radiology nursing throughout the procedure under my direct supervision. FLUOROSCOPY TIME:  Ultrasound COMPLICATIONS: None PROCEDURE: Informed written consent was obtained from the patient after a thorough discussion of the procedural risks, benefits and alternatives. All questions were addressed. Maximal Sterile Barrier Technique was utilized including caps, mask, sterile gowns, sterile gloves, sterile drape, hand hygiene and skin antiseptic. A timeout was performed prior to the initiation of the procedure Ultrasound survey was performed with images stored and sent to PACs. The left axillary region was prepped with chlorhexidine in a sterile fashion, and a sterile drape was applied covering the operative field. A sterile gown and sterile gloves were used for the procedure. Local anesthesia was provided with 1%  Lidocaine. Ultrasound guidance was used to infiltrate the region with 1% lidocaine for local anesthesia. Four separate 16 gauge core biopsy were then acquired of the left axillary mass/lymph node using ultrasound guidance. Images were stored. Final image was stored after biopsy. Patient tolerated the procedure well and remained hemodynamically stable throughout. No complications were encountered and no significant blood loss was encounter IMPRESSION: Status post ultrasound-guided left axillary mass biopsy. Signed, Dulcy Fanny. Dellia Nims, RPVI [facility] Electronically Signed   By: Corrie Mckusick D.O.   On: 02/29/2020 16:40   UE VENOUS DUPLEX ([facility] & [facility] 7 am - 7 pm)  Result Date: 02/27/2020 UPPER VENOUS STUDY  Indications: Swelling of LT forearm/wrist/hand. Limitations: Depth and size of vessels. Comparison Study: No prior studies available. Performing Technologist: Darlin Coco RDMS  Examination Guidelines: A complete evaluation includes B-mode imaging, spectral Doppler, color Doppler, and power Doppler as needed of all accessible portions of each vessel. Bilateral testing is considered an integral part of a complete examination. Limited examinations for reoccurring indications may be performed as noted.  Right Findings: +----------+------------+---------+-----------+----------+-------+ RIGHT     CompressiblePhasicitySpontaneousPropertiesSummary +----------+------------+---------+-----------+----------+-------+ Subclavian               Yes       Yes                      +----------+------------+---------+-----------+----------+-------+  Left Findings: +----------+------------+---------+-----------+----------+-------+ LEFT      CompressiblePhasicitySpontaneousPropertiesSummary +----------+------------+---------+-----------+----------+-------+ IJV           Full       Yes       Yes                       +----------+------------+---------+-----------+----------+-------+ Subclavian    Full       Yes       Yes                      +----------+------------+---------+-----------+----------+-------+ Axillary      Full       Yes       Yes                      +----------+------------+---------+-----------+----------+-------+ Brachial      Full       Yes       Yes                      +----------+------------+---------+-----------+----------+-------+ Radial        Full                                          +----------+------------+---------+-----------+----------+-------+ Ulnar         Full                                          +----------+------------+---------+-----------+----------+-------+ Cephalic      Full                                          +----------+------------+---------+-----------+----------+-------+ Basilic       Full                                          +----------+------------+---------+-----------+----------+-------+  Summary:  Right: No evidence of thrombosis in the subclavian.  Left: No evidence of deep vein thrombosis in the upper extremity. No evidence of superficial vein thrombosis in the upper extremity.  Indicental: Appearance of enlarged, hypoechoic lymph node with loss of hilar definition in the axilla.  *See table(s) above for measurements and observations.  Diagnosing physician: Harold Barban MD Electronically signed by Harold Barban MD on 02/27/2020 at 9:42:15 PM.    Final      Labs: BNP (last 3 results) Recent Labs    02/27/20 1721  BNP 29.9   Basic Metabolic Panel: Recent Labs  Lab 02/28/20 0443 02/29/20 0515 03/01/20 0323  NA 134* 136 139  K 3.7 3.8 3.3*  CL 97* 98 103  CO2 21* 21* 21*  GLUCOSE 70 63* 127*  BUN 28* 32* 25*  CREATININE 0.89 0.91 0.75  CALCIUM 10.7* 11.1* 9.6   Liver Function Tests: Recent Labs  Lab 02/27/20 1721 02/28/20 0443 02/29/20 0515 03/01/20 0323  AST 154* 143* 143* 252*   ALT 30 27 26  49*  ALKPHOS 652* 590* 583* 534*  BILITOT 0.6 1.2 1.4* 0.8  PROT 6.4* 5.7* 5.7* 5.3*  ALBUMIN 2.6* 2.2* 2.3* 2.1*   No results  for input(s): LIPASE, AMYLASE in the last 168 hours. No results for input(s): AMMONIA in the last 168 hours. CBC: Recent Labs  Lab 02/28/20 0443 02/29/20 0515 03/01/20 0323  WBC 9.2 8.4 7.0  HGB 12.6 11.9* 13.0  HCT 38.0 37.7 38.6  MCV 83.3 84.3 82.1  PLT 349 362 316   Cardiac Enzymes: No results for input(s): CKTOTAL, CKMB, CKMBINDEX, TROPONINI in the last 168 hours. BNP: Invalid input(s): POCBNP CBG: Recent Labs  Lab 02/29/20 0749 02/29/20 0823 02/29/20 1544  GLUCAP 55* 140* 91   D-Dimer No results for input(s): DDIMER in the last 72 hours. Hgb A1c No results for input(s): HGBA1C in the last 72 hours. Lipid Profile No results for input(s): CHOL, HDL, LDLCALC, TRIG, CHOLHDL, LDLDIRECT in the last 72 hours. Thyroid function studies No results for input(s): TSH, T4TOTAL, T3FREE, THYROIDAB in the last 72 hours.  Invalid input(s): FREET3 Anemia work up No results for input(s): VITAMINB12, FOLATE, FERRITIN, TIBC, IRON, RETICCTPCT in the last 72 hours. Urinalysis No results found for: COLORURINE, APPEARANCEUR, [place], [place], [place], [place], [place], [place], PROTEINUR, UROBILINOGEN, NITRITE, LEUKOCYTESUR Sepsis Labs Invalid input(s): PROCALCITONIN,  WBC,  LACTICIDVEN Microbiology Recent Results (from the past 240 hour(s))  SARS Coronavirus 2 by RT PCR (hospital order, performed in [facility] hospital lab) Nasopharyngeal Nasopharyngeal Swab     Status: None   Collection Time: 02/27/20  9:07 PM   Specimen: Nasopharyngeal Swab  Result Value Ref Range Status   SARS Coronavirus 2 NEGATIVE NEGATIVE Final    Comment: (NOTE) SARS-CoV-2 target nucleic acids are NOT DETECTED.  The SARS-CoV-2 RNA is generally detectable in upper and lower respiratory specimens during the acute phase of infection. The lowest concentration  of SARS-CoV-2 viral copies this assay can detect is 250 copies / mL. A negative result does not preclude SARS-CoV-2 infection and should not be used as the sole basis for treatment or other patient management decisions.  A negative result may occur with improper specimen collection / handling, submission of specimen other than nasopharyngeal swab, presence of viral mutation(s) within the areas targeted by this assay, and inadequate number of viral copies (<250 copies / mL). A negative result must be combined with clinical observations, patient history, and epidemiological information.  Fact Sheet for Patients:   StrictlyIdeas.no  Fact Sheet for Healthcare Providers: BankingDealers.co.za  This test is not yet approved or  cleared by the [place] FDA and has been authorized for detection and/or diagnosis of SARS-CoV-2 by FDA under an Emergency Use Authorization (EUA).  This EUA will remain in effect (meaning this test can be used) for the duration of the COVID-19 declaration under Section 564(b)(1) of the Act, 21 U.S.C. section 360bbb-3(b)(1), unless the authorization is terminated or revoked sooner.  Performed at [facility], [place] [address] [place], [place] [address]      Time coordinating discharge: 25 minutes  SIGNED: Antonieta Pert, MD  [facility] 03/05/2020, 4:25 PM  If 7PM-7AM, please contact night-coverage [email or web address]

## 2020-03-05 NOTE — Progress Notes (Signed)
Occupational Therapy Treatment Patient Details Name: Morgan Yu MRN: [id number] DOB: 11-24-1940 Today's Date: 03/05/2020    History of present illness Pt is a 80 y/o female admitted secondary to worsening LUE swelling. Found to have L breast mass with concern for metastasis. No pertinent PMH in the chart.   OT comments  Pt progressing towards OT goals this session, min guard for bed mobility and transfers with RW. Able to complete grooming tasks in standing. While seated in recliner Pt educated on energy conservation techniques (this is a personal concern of hers) POC remains appropriate at this time. OT will continue to follow while admitted, but is pending DC today.    Follow Up Recommendations  Home health OT;Supervision/Assistance - 24 hour    Equipment Recommendations  3 in 1 bedside commode (delivered and in room)    Recommendations for Other Services      Precautions / Restrictions Precautions Precautions: Fall Precaution Comments: monitor vitals Restrictions Weight Bearing Restrictions: No       Mobility Bed Mobility Overal bed mobility: Needs Assistance Bed Mobility: Supine to Sit     Supine to sit: Min guard     General bed mobility comments: extra time and effort required  Transfers Overall transfer level: Needs assistance Equipment used: Rolling walker (2 wheeled) Transfers: Sit to/from Stand Sit to Stand: Min guard         General transfer comment: min guard for safety, good awarenes of hand placement    Balance Overall balance assessment: Needs assistance Sitting-balance support: Feet supported Sitting balance-Leahy Scale: Normal     Standing balance support: Bilateral upper extremity supported;During functional activity Standing balance-Leahy Scale: Fair Standing balance comment: able to static stand for grooming, leans against the sink                           ADL either performed or assessed with clinical judgement   ADL  Overall ADL's : Needs assistance/impaired     Grooming: Min guard;Wash/dry hands;Wash/dry face;Oral care;Standing Grooming Details (indicate cue type and reason): sink level                 Toilet Transfer: Min guard;Ambulation;RW Toilet Transfer Details (indicate cue type and reason): simulated through recliner transfer         Functional mobility during ADLs: Min guard;Rolling walker General ADL Comments: education on energy conservation once in recliner. Pt verbalizing understanding     Vision       Perception     Praxis      Cognition Arousal/Alertness: Awake/alert Behavior During Therapy: WFL for tasks assessed/performed Overall Cognitive Status: Impaired/Different from baseline Area of Impairment: Safety/judgement;Problem solving;Awareness                         Safety/Judgement: Decreased awareness of safety;Decreased awareness of deficits Awareness: Intellectual;Emergent Problem Solving: Slow processing;Requires verbal cues;Requires tactile cues General Comments: still anxious about returning home but family is very supportive; A&Ox4, mild reduced insight into deficits and safety        Exercises     Shoulder Instructions       General Comments neice Baker Janus present throughout session    Pertinent Vitals/ Pain       Pain Assessment: No/denies pain Pain Score: 0-No pain Pain Intervention(s): Monitored during session  Home Living  Prior Functioning/Environment              Frequency  Min 2X/week        Progress Toward Goals  OT Goals(current goals can now be found in the care plan section)  Progress towards OT goals: Progressing toward goals  Acute Rehab OT Goals Patient Stated Goal: to be safe at home OT Goal Formulation: With patient Time For Goal Achievement: 03/14/20 Potential to Achieve Goals: Good  Plan Discharge plan remains appropriate     Co-evaluation                 AM-PAC OT "6 Clicks" Daily Activity     Outcome Measure   Help from another person eating meals?: None Help from another person taking care of personal grooming?: A Little Help from another person toileting, which includes using toliet, bedpan, or urinal?: A Little Help from another person bathing (including washing, rinsing, drying)?: A Little Help from another person to put on and taking off regular upper body clothing?: None Help from another person to put on and taking off regular lower body clothing?: A Little 6 Click Score: 20    End of Session Equipment Utilized During Treatment: Gait belt;Rolling walker  OT Visit Diagnosis: Unsteadiness on feet (R26.81);Muscle weakness (generalized) (M62.81)   Activity Tolerance Patient tolerated treatment well   Patient Left in chair;with call bell/phone within reach;with chair alarm set;Other (comment) (PT going in)   Nurse Communication Mobility status        Time: 7017-7939 OT Time Calculation (min): 23 min  Charges: OT General Charges $OT Visit: 1 Visit OT Treatments $Self Care/Home Management : 8-22 mins $Therapeutic Activity: 8-22 mins  Jesse Sans OTR/L Acute Rehabilitation Services Pager: [phone] Office: [place] 03/05/2020, 11:26 AM

## 2020-03-05 NOTE — Progress Notes (Signed)
Physical Therapy Treatment Patient Details Name: Morgan Yu MRN: [id number] DOB: [birth year] Today's Date: 03/05/2020    History of Present Illness Pt is a 80 y/o female admitted secondary to worsening LUE swelling. Found to have L breast mass with concern for metastasis. No pertinent PMH in the chart.    PT Comments    Patient received in recliner, pleasant and willing to participate in session. Able to mobilize on a min guard level with RW, however did need MinA for stair navigation with RW for device management and balance. Neither patient or cousin feel comfortable doing this technique at home, so I advised against depending on the wall/cabinets for balance on steps and that it would be safer to use the front entrance where she has a railing at this time. Family plans to install railing in the garage as well. Left up in recliner with all needs met, family present and nursing aware of patient status.     Follow Up Recommendations  Home health PT     Equipment Recommendations  Rolling walker with 5" wheels    Recommendations for Other Services       Precautions / Restrictions Precautions Precautions: Fall Precaution Comments: monitor vitals Restrictions Weight Bearing Restrictions: No    Mobility  Bed Mobility               General bed mobility comments: up in recliner upon entry  Transfers Overall transfer level: Needs assistance Equipment used: Rolling walker (2 wheeled) Transfers: Sit to/from Stand Sit to Stand: Min guard         General transfer comment: min guard for safety, good awarenes of hand placement  Ambulation/Gait Ambulation/Gait assistance: Min guard Gait Distance (Feet): 60 Feet (23fx2) Assistive device: Rolling walker (2 wheeled) Gait Pattern/deviations: Step-through pattern;Decreased stride length;Wide base of support Gait velocity: Decreased   General Gait Details: slow but steady wth RW, easily fatigued; min guard for safety but no  physical assist given   Stairs Stairs: Yes Stairs assistance: Min assist Stair Management: Step to pattern;Backwards;With walker Number of Stairs: 3 General stair comments: MinA for backwards ascent with RW- assist for device management and balance   Wheelchair Mobility    Modified Rankin (Stroke Patients Only)       Balance Overall balance assessment: Needs assistance Sitting-balance support: Feet supported Sitting balance-Leahy Scale: Normal     Standing balance support: Bilateral upper extremity supported;During functional activity Standing balance-Leahy Scale: Fair Standing balance comment: benefits from BUE support                            Cognition Arousal/Alertness: Awake/alert Behavior During Therapy: WFL for tasks assessed/performed Overall Cognitive Status: Impaired/Different from baseline                           Safety/Judgement: Decreased awareness of safety;Decreased awareness of deficits Awareness: Intellectual Problem Solving: Slow processing;Requires verbal cues;Requires tactile cues General Comments: still anxious about returning home but family is very supportive; A&Ox4, mild reduced insight into deficits and safety      Exercises      General Comments        Pertinent Vitals/Pain Pain Assessment: No/denies pain Pain Score: 0-No pain Pain Intervention(s): Limited activity within patient's tolerance;Monitored during session    Home Living                      Prior  Function            PT Goals (current goals can now be found in the care plan section) Acute Rehab PT Goals Patient Stated Goal: to go home PT Goal Formulation: With patient Time For Goal Achievement: 03/13/20 Potential to Achieve Goals: Good Progress towards PT goals: Progressing toward goals    Frequency    Min 3X/week      PT Plan Current plan remains appropriate    Co-evaluation              AM-PAC PT "6 Clicks"  Mobility   Outcome Measure  Help needed turning from your back to your side while in a flat bed without using bedrails?: A Little Help needed moving from lying on your back to sitting on the side of a flat bed without using bedrails?: A Little Help needed moving to and from a bed to a chair (including a wheelchair)?: A Little Help needed standing up from a chair using your arms (e.g., wheelchair or bedside chair)?: A Little Help needed to walk in hospital room?: A Little Help needed climbing 3-5 steps with a railing? : A Little 6 Click Score: 18    End of Session Equipment Utilized During Treatment: Gait belt Activity Tolerance: Patient tolerated treatment well Patient left: in chair;with call bell/phone within reach;with family/visitor present Nurse Communication: Mobility status PT Visit Diagnosis: Unsteadiness on feet (R26.81);Muscle weakness (generalized) (M62.81)     Time: 4259-5638 PT Time Calculation (min) (ACUTE ONLY): 15 min  Charges:  $Gait Training: 8-22 mins                     Windell Norfolk, DPT, PN1   Supplemental Physical Therapist [place]    Pager [phone] Acute Rehab Office [phone]

## 2020-03-05 NOTE — Plan of Care (Signed)

## 2020-03-05 NOTE — Care Management Important Message (Signed)
Important Message  Patient Details  Name: Morgan Yu MRN: [id number] Date of Birth: [birth year]   Medicare Important Message Given:  Yes     Advait Buice P [place] 03/05/2020, 2:27 PM

## 2020-03-05 NOTE — Progress Notes (Signed)
DISCHARGE NOTE HOME  Morgan Yu to be discharged Home per MD order. Discussed prescriptions and follow up appointments with the patient. Prescriptions given to patient; medication list explained in detail. Patient verbalized understanding.  Skin clean, dry and intact without evidence of skin break down, no evidence of skin tears noted. IV catheter discontinued intact. Site without signs and symptoms of complications. Dressing and pressure applied. Pt denies pain at the site currently. No complaints noted.  Patient free of lines, drains, and wounds.   An After Visit Summary (AVS) was printed and given to the patient. Patient escorted via wheelchair, and discharged home via private auto.  Beatris Ship, RN

## 2020-03-06 ENCOUNTER — Other Ambulatory Visit: Payer: Self-pay

## 2020-03-06 ENCOUNTER — Inpatient Hospital Stay: Payer: Medicare HMO

## 2020-03-06 ENCOUNTER — Telehealth: Payer: Self-pay | Admitting: Hematology and Oncology

## 2020-03-06 ENCOUNTER — Inpatient Hospital Stay: Payer: Medicare HMO | Attending: Hematology and Oncology | Admitting: Hematology and Oncology

## 2020-03-06 ENCOUNTER — Encounter: Payer: Self-pay | Admitting: Hematology and Oncology

## 2020-03-06 ENCOUNTER — Encounter: Payer: Self-pay | Admitting: *Deleted

## 2020-03-06 VITALS — BP 147/75 | HR 109 | Temp 97.8°F | Resp 20 | Ht 60.0 in | Wt 86.9 lb

## 2020-03-06 DIAGNOSIS — I6782 Cerebral ischemia: Secondary | ICD-10-CM | POA: Insufficient documentation

## 2020-03-06 DIAGNOSIS — C773 Secondary and unspecified malignant neoplasm of axilla and upper limb lymph nodes: Secondary | ICD-10-CM | POA: Insufficient documentation

## 2020-03-06 DIAGNOSIS — R531 Weakness: Secondary | ICD-10-CM | POA: Diagnosis not present

## 2020-03-06 DIAGNOSIS — R918 Other nonspecific abnormal finding of lung field: Secondary | ICD-10-CM | POA: Diagnosis not present

## 2020-03-06 DIAGNOSIS — Z803 Family history of malignant neoplasm of breast: Secondary | ICD-10-CM | POA: Insufficient documentation

## 2020-03-06 DIAGNOSIS — I700 Atherosclerosis of aorta: Secondary | ICD-10-CM | POA: Insufficient documentation

## 2020-03-06 DIAGNOSIS — C787 Secondary malignant neoplasm of liver and intrahepatic bile duct: Secondary | ICD-10-CM | POA: Diagnosis not present

## 2020-03-06 DIAGNOSIS — Z170 Estrogen receptor positive status [ER+]: Secondary | ICD-10-CM | POA: Insufficient documentation

## 2020-03-06 DIAGNOSIS — C7951 Secondary malignant neoplasm of bone: Secondary | ICD-10-CM

## 2020-03-06 DIAGNOSIS — K5900 Constipation, unspecified: Secondary | ICD-10-CM | POA: Insufficient documentation

## 2020-03-06 DIAGNOSIS — R609 Edema, unspecified: Secondary | ICD-10-CM | POA: Insufficient documentation

## 2020-03-06 DIAGNOSIS — C50912 Malignant neoplasm of unspecified site of left female breast: Secondary | ICD-10-CM | POA: Insufficient documentation

## 2020-03-06 DIAGNOSIS — Z800 Family history of malignant neoplasm of digestive organs: Secondary | ICD-10-CM | POA: Diagnosis not present

## 2020-03-06 DIAGNOSIS — R7401 Elevation of levels of liver transaminase levels: Secondary | ICD-10-CM | POA: Insufficient documentation

## 2020-03-06 DIAGNOSIS — J90 Pleural effusion, not elsewhere classified: Secondary | ICD-10-CM | POA: Diagnosis not present

## 2020-03-06 LAB — CBC WITH DIFFERENTIAL/PLATELET
Abs Immature Granulocytes: 0.11 10*3/uL — ABNORMAL HIGH (ref 0.00–0.07)
Basophils Absolute: 0.1 10*3/uL (ref 0.0–0.1)
Basophils Relative: 1 %
Eosinophils Absolute: 0 10*3/uL (ref 0.0–0.5)
Eosinophils Relative: 0 %
HCT: 38.3 % (ref 36.0–46.0)
Hemoglobin: 12.6 g/dL (ref 12.0–15.0)
Immature Granulocytes: 1 %
Lymphocytes Relative: 22 %
Lymphs Abs: 2.3 10*3/uL (ref 0.7–4.0)
MCH: 27 pg (ref 26.0–34.0)
MCHC: 32.9 g/dL (ref 30.0–36.0)
MCV: 82.2 fL (ref 80.0–100.0)
Monocytes Absolute: 1.1 10*3/uL — ABNORMAL HIGH (ref 0.1–1.0)
Monocytes Relative: 11 %
Neutro Abs: 6.9 10*3/uL (ref 1.7–7.7)
Neutrophils Relative %: 65 %
Platelets: 361 10*3/uL (ref 150–400)
RBC: 4.66 MIL/uL (ref 3.87–5.11)
RDW: 21.5 % — ABNORMAL HIGH (ref 11.5–15.5)
WBC: 10.5 10*3/uL (ref 4.0–10.5)
nRBC: 0 % (ref 0.0–0.2)

## 2020-03-06 LAB — CMP (CANCER CENTER ONLY)
ALT: 33 U/L (ref 0–44)
AST: 181 U/L (ref 15–41)
Albumin: 2.2 g/dL — ABNORMAL LOW (ref 3.5–5.0)
Alkaline Phosphatase: 1299 U/L — ABNORMAL HIGH (ref 38–126)
Anion gap: 13 (ref 5–15)
BUN: 33 mg/dL — ABNORMAL HIGH (ref 8–23)
CO2: 22 mmol/L (ref 22–32)
Calcium: 8.1 mg/dL — ABNORMAL LOW (ref 8.9–10.3)
Chloride: 99 mmol/L (ref 98–111)
Creatinine: 0.82 mg/dL (ref 0.44–1.00)
GFR, Estimated: >60 mL/min (ref >60)
Glucose, Bld: 120 mg/dL — ABNORMAL HIGH (ref 70–99)
Potassium: 3.5 mmol/L (ref 3.5–5.1)
Sodium: 134 mmol/L — ABNORMAL LOW (ref 135–145)
Total Bilirubin: 1.3 mg/dL — ABNORMAL HIGH (ref 0.3–1.2)
Total Protein: 6.3 g/dL — ABNORMAL LOW (ref 6.5–8.1)

## 2020-03-06 NOTE — Telephone Encounter (Signed)
Scheduled appointment per 2/3 los. Spoke to patient who is aware of appointment date and time.

## 2020-03-06 NOTE — Progress Notes (Signed)
Order placed for outpatient palliative care 03/05/2020 @ 1705,pt had already d/c to home. NCM called pt, spoke with caregiver Edd Fabian and confirmed pt would like outpatient palliative care. Pt 's preference : [facility]. NCM explained [facility] is out network with [employer]. NCM given the ok to make referral with [facility].  Referral made with [facility] and accepted. Whitman Hero RN,BSN,CM

## 2020-03-06 NOTE — Progress Notes (Signed)
Morgan Yu FOLLOW UP NOTE  Patient Care Team: Patient, Morgan Yu as PCP - General (General Practice) Morgan Pike, MD as Consulting Physician (Hematology and Oncology)  CHIEF COMPLAINTS/PURPOSE OF CONSULTATION:  New diagnosis of metastatic breast cancer.  ASSESSMENT & PLAN:  Morgan problem-specific Assessment & Plan notes found for this encounter.  Morgan orders of the defined types were placed in this encounter.  Metastatic breast cancer with pulmonary nodules, liver masses and bone lesions.  -Discussed biopsy results of the chest wall/left axillary mass which were consistent with metastatic carcinoma.  Overall, morphology consistent with breast primary as expected.  -Discussed with the patient that we are awaiting the prognostic profile before we can determine treatment recommendations. - If she is ER PR positive and Her 2 negative, then I would recommend [place] with letrozole and bisphosphonates for bone health. -We again discussed that her metastatic breast cancer is not curable but is treatable. -We were expecting results today but I was told these will now be available on Friday. She is agreeable to trying non chemotherapy modality of treatment.  Hypercalcemia likely due to malignancy Resolved.   Transaminitis related to metastatic disease.  HISTORY OF PRESENTING ILLNESS:  Morgan Yu 80 y.o. female is here because of newly diagnosed metastatic breast cancer.  Oncology History   Morgan history exists.     [name] is a 80 year old female with Morgan significant past medical history but has not seen a physician in many years.  She presented to the emergency department with weakness.  She has a left breast mass and developed left arm swelling.  The patient thinks that the breast mass and swelling have been present for at least a few weeks to months.  She states that she initially developed pruritus and later a lump involving the left breast.  Left arm swelling has been  more recent.  Last mammogram was many years ago.  CTA chest and CT of the abdomen pelvis were performed which showed Morgan evidence of PE but did show moderate bilateral pleural effusions, nodules in the right middle lung and right lower lung likely metastatic, diffuse hepatic metastasis, diffuse skeletal metastasis.    She is not certain how long the breast mass has been present and how long she has had left arm swelling but estimates this to be at least weeks to months.  She reports generalized weakness.  She is not having any headaches or dizziness.  Morgan seizure activity.  Denies chest pain, shortness of breath, cough, abdominal pain, nausea, vomiting. Morgan bony pain reported.  She fell a few nights ago.  Thinks that she lost her balance.  She reports being independent with her ADLs but over the past 2 weeks has stopped driving.  Family member at the bedside indicates that she may have some mild confusion.    The patient is widowed and lives alone.  She has Morgan biological children but has 2 stepchildren.  She has friends and family who check on her.  Denies history of alcohol tobacco use.    Family history significant for a maternal aunt with esophageal cancer (smoker) and a paternal aunt with breast cancer in her 66s.  The patient reports menarche around age 56 and menopause at around age 58.  Used hormone replacement therapy for about 1 year around age 44.    CTA chest and CT of the abdomen pelvis were performed which showed Morgan evidence of PE but did show moderate bilateral pleural effusions, nodules in the  right middle lung and right lower lung likely metastatic, diffuse hepatic metastasis, diffuse skeletal metastasis  Pathology confirmed metastatic carcinoma, breast primary, prognostics pending.  INTERIM HISTORY  She is feeling tired. Morgan intolerable pain. Constipation, took a dose of laxative this morning Otherwise she feels well.  REVIEW OF SYSTEMS:   Constitutional: Denies fevers, chills or  abnormal night sweats Eyes: Denies blurriness of vision, double vision or watery eyes Ears, nose, mouth, throat, and face: Denies mucositis or sore throat Respiratory: Denies cough, dyspnea or wheezes Cardiovascular: Denies palpitation, chest discomfort or lower extremity swelling Gastrointestinal:  Denies nausea, heartburn or change in bowel habits Skin: Denies abnormal skin rashes Lymphatics: Denies new lymphadenopathy or easy bruising Neurological:Denies numbness, tingling or new weaknesses Behavioral/Psych: Mood is stable, Morgan new changes  All other systems were reviewed with the patient and are negative.   MEDICAL HISTORY:  Morgan past medical history on file.  SURGICAL HISTORY: Morgan past surgical history on file.  SOCIAL HISTORY: Social History   Socioeconomic History  . Marital status: Widowed    Spouse name: Not on file  . Number of children: Not on file  . Years of education: Not on file  . Highest education level: Not on file  Occupational History  . Not on file  Tobacco Use  . Smoking status: Never Smoker  . Smokeless tobacco: Never Used  Substance and Sexual Activity  . Alcohol use: Not Currently  . Drug use: Never  . Sexual activity: Not on file  Other Topics Concern  . Not on file  Social History Narrative   Morgan biological children   Has 2 stepchildren   Social Determinants of Health   Financial Resource Strain: Not on file  Food Insecurity: Not on file  Transportation Needs: Not on file  Physical Activity: Not on file  Stress: Not on file  Social Connections: Not on file  Intimate Partner Violence: Not on file    FAMILY HISTORY: Family History  Problem Relation Age of Onset  . Stroke Mother   . Heart attack Father   . Cancer Brother   . Esophageal cancer Maternal Aunt   . Breast cancer Maternal Aunt     ALLERGIES:  is allergic to vicodin hp [hydrocodone-acetaminophen].  MEDICATIONS:  Current Outpatient Medications  Medication Sig Dispense  Refill  . acetaminophen (TYLENOL) 500 MG tablet Take 500 mg by mouth every 6 (six) hours as needed for moderate pain.    . feeding supplement (ENSURE ENLIVE / ENSURE PLUS) LIQD Take 237 mLs by mouth 3 (three) times daily between meals. 237 mL 12  . mirtazapine (REMERON) 15 MG tablet Take 1 tablet (15 mg total) by mouth at bedtime. 30 tablet 0  . Probiotic Product (PROBIOTIC PO) Take 1 capsule by mouth daily.     Morgan current facility-administered medications for this visit.    PHYSICAL EXAMINATION:  ECOG PERFORMANCE STATUS: 0 - Asymptomatic  Vitals:   03/06/20 1448  BP: (!) 147/75  Pulse: (!) 109  Resp: 20  Temp: 97.8 F (36.6 C)  SpO2: 96%   Filed Weights   03/06/20 1448  Weight: 86 lb 14.4 oz (39.4 kg)    GENERAL:alert, Morgan distress and comfortable, in a wheel chair SKIN: skin color, texture, turgor are normal, Morgan rashes or significant lesions Lymphatics: Multiple palpable cervical lymph nodes, palpable left axillary lymph nodes BREAST: Left breast with erythema, skin involvement noted, palpable mass in the left breast occupying pretty much the entire breast,  Respiratory: lungs were clear  bilaterally without wheezing or crackles.   Cardiovascular:  Regular rate and rhythm, S1/S2, without murmur, rub or gallop.  There was Morgan pedal edema.   GI:  abdomen was soft, flat, nontender, nondistended, without organomegaly.   Musculoskeletal: Strength symmetrical in the upper and lower extremities. Skin exam was without echymosis, petichae.   Neuro exam was nonfocal. Patient was alert and oriented.  Attention was good.   Language was appropriate.  Mood was normal without depression.  Speech was not pressured.  Thought content was not tangential.    LABORATORY DATA:  I have reviewed the data as listed Lab Results  Component Value Date   WBC 7.0 03/01/2020   HGB 13.0 03/01/2020   HCT 38.6 03/01/2020   MCV 82.1 03/01/2020   PLT 316 03/01/2020     Chemistry      Component Value  Date/Time   NA 139 03/01/2020 0323   K 3.3 (L) 03/01/2020 0323   CL 103 03/01/2020 0323   CO2 21 (L) 03/01/2020 0323   BUN 25 (H) 03/01/2020 0323   CREATININE 0.75 03/01/2020 0323      Component Value Date/Time   CALCIUM 9.6 03/01/2020 0323   ALKPHOS 534 (H) 03/01/2020 0323   AST 252 (H) 03/01/2020 0323   ALT 49 (H) 03/01/2020 0323   BILITOT 0.8 03/01/2020 0323       RADIOGRAPHIC STUDIES: I have personally reviewed the radiological images as listed and agreed with the findings in the report. DG Chest 2 View  Result Date: 02/29/2020 CLINICAL DATA:  Pleural effusions, weakness EXAM: CHEST - 2 VIEW COMPARISON:  Radiograph 02/27/2020 FINDINGS: Normal cardiac silhouette. Small LEFT effusion. Lungs are hyperinflated. Chronic bronchitic markings. Morgan pneumothorax. Morgan focal consolidation. Severe degenerative changes in the glenoid fossa. IMPRESSION: 1. Small LEFT effusion. 2. Hyperinflated lungs and chronic bronchitic markings. Electronically Signed   By: Suzy Bouchard M.D.   On: 02/29/2020 12:38   CT Angio Chest PE W and/or Wo Contrast  Result Date: 02/27/2020 CLINICAL DATA:  Acute abdominal pain. Elevated liver function test. Suspected breast cancer. Query metastasis. Query shortness of breath. Pulmonary embolus suspected with high probability. EXAM: CT ANGIOGRAPHY CHEST CT ABDOMEN AND PELVIS WITH CONTRAST TECHNIQUE: Multidetector CT imaging of the chest was performed using the standard protocol during bolus administration of intravenous contrast. Multiplanar CT image reconstructions and MIPs were obtained to evaluate the vascular anatomy. Multidetector CT imaging of the abdomen and pelvis was performed using the standard protocol during bolus administration of intravenous contrast. CONTRAST:  125mL OMNIPAQUE IOHEXOL 350 MG/ML SOLN COMPARISON:  Chest radiograph 02/27/2020 FINDINGS: CTA CHEST FINDINGS Cardiovascular: Satisfactory opacification of the pulmonary arteries to the segmental level. Morgan  evidence of pulmonary embolism. Normal heart size. Morgan pericardial effusion. Normal caliber thoracic aorta. Morgan aortic dissection. Mediastinum/Nodes: Esophagus is decompressed. Morgan significant lymphadenopathy in the mediastinum. Multiple enlarged lymph nodes in the left axilla, measuring up to 2.5 x 3.3 cm in diameter, likely metastatic. Lungs/Pleura: Moderate bilateral pleural effusions. Atelectasis in the lung bases with patchy airspace infiltrates in the remaining lungs. This could represent edema or pneumonia. Noncalcified nodule in the right middle lung measuring 9 mm in diameter, likely metastatic. Additional smaller nodules demonstrated in the right lower lung. Evaluation is limited due to motion artifact. Musculoskeletal: Diffuse skeletal metastasis with mixed sclerotic and lucent lesions throughout the visualized skeletal system. Review of the MIP images confirms the above findings. CT ABDOMEN and PELVIS FINDINGS Hepatobiliary: Multiple low-attenuation peripherally enhancing lesions demonstrated throughout the liver consistent with  diffuse hepatic metastasis. Portal veins are patent. Gallbladder and bile ducts are unremarkable. Pancreas: Unremarkable. Morgan pancreatic ductal dilatation or surrounding inflammatory changes. Spleen: Normal in size without focal abnormality. Adrenals/Urinary Tract: Adrenal glands are unremarkable. Kidneys are normal, without renal calculi, focal lesion, or hydronephrosis. Bladder is unremarkable. Stomach/Bowel: Stomach is within normal limits. Appendix appears normal. Morgan evidence of bowel wall thickening, distention, or inflammatory changes. Vascular/Lymphatic: Aortic atherosclerosis. Morgan enlarged abdominal or pelvic lymph nodes. Reproductive: Uterus and bilateral adnexa are unremarkable. Other: Small amount of free fluid in the abdomen and pelvis, possibly reactive or may indicate peritoneal metastasis. Prominent upper abdominal varices. Edema in the subcutaneous fat throughout the  abdominal wall. Morgan free air. Musculoskeletal: Diffuse skeletal metastasis with mixed sclerotic and lucent lesions for about the visualized skeletal elements. Review of the MIP images confirms the above findings. IMPRESSION: 1. Morgan evidence of significant pulmonary embolus. 2. Moderate bilateral pleural effusions with basilar atelectasis. Patchy airspace infiltrates in the remaining lungs may represent edema or pneumonia. 3. Noncalcified nodules in the right middle lung and right lower lung, likely metastatic. 4. Diffuse hepatic metastasis. 5. Diffuse skeletal metastasis. 6. Prominent upper abdominal varices. 7. Small amount of free fluid in the abdomen and pelvis, may be reactive or may indicate peritoneal metastasis. 8. Edema in the subcutaneous fat throughout the abdominal wall. Aortic Atherosclerosis (ICD10-I70.0). Electronically Signed   By: Lucienne Capers M.D.   On: 02/27/2020 19:40   MR Brain W Wo Contrast  Result Date: 02/28/2020 CLINICAL DATA:  Breast cancer, staging EXAM: MRI HEAD WITHOUT AND WITH CONTRAST TECHNIQUE: Multiplanar, multiecho pulse sequences of the brain and surrounding structures were obtained without and with intravenous contrast. CONTRAST:  76mL GADAVIST GADOBUTROL 1 MMOL/ML IV SOLN COMPARISON:  None. FINDINGS: Brain: There is Morgan acute infarction or intracranial hemorrhage. There is Morgan intracranial mass, mass effect, or edema. There is Morgan hydrocephalus or extra-axial fluid collection. Prominence of the ventricles and sulci reflects minor generalized parenchymal volume loss. Patchy foci of T2 hyperintensity in the supratentorial white matter are nonspecific but probably reflect mild chronic microvascular ischemic changes. Punctate focus of susceptibility in the right temporal subcortical white matter likely reflects chronic microhemorrhage. Morgan abnormal enhancement. Vascular: Major vessel flow voids at the skull base are preserved. Skull and upper cervical spine: Abnormal T1 marrow signal  in the visualized cervical spine. Patchy abnormal marrow signal the calvarium and skull base. Sinuses/Orbits: Paranasal sinuses are aerated. Orbits are unremarkable. Other: Sella is unremarkable.  Mastoid air cells are clear. IMPRESSION: Morgan evidence of intracranial metastatic disease. Diffuse osseous metastatic disease. Mild chronic microvascular ischemic changes. Electronically Signed   By: Macy Mis M.D.   On: 02/28/2020 13:57   CT ABDOMEN PELVIS W CONTRAST  Result Date: 02/27/2020 CLINICAL DATA:  Acute abdominal pain. Elevated liver function test. Suspected breast cancer. Query metastasis. Query shortness of breath. Pulmonary embolus suspected with high probability. EXAM: CT ANGIOGRAPHY CHEST CT ABDOMEN AND PELVIS WITH CONTRAST TECHNIQUE: Multidetector CT imaging of the chest was performed using the standard protocol during bolus administration of intravenous contrast. Multiplanar CT image reconstructions and MIPs were obtained to evaluate the vascular anatomy. Multidetector CT imaging of the abdomen and pelvis was performed using the standard protocol during bolus administration of intravenous contrast. CONTRAST:  136mL OMNIPAQUE IOHEXOL 350 MG/ML SOLN COMPARISON:  Chest radiograph 02/27/2020 FINDINGS: CTA CHEST FINDINGS Cardiovascular: Satisfactory opacification of the pulmonary arteries to the segmental level. Morgan evidence of pulmonary embolism. Normal heart size. Morgan pericardial effusion. Normal caliber thoracic  aorta. Morgan aortic dissection. Mediastinum/Nodes: Esophagus is decompressed. Morgan significant lymphadenopathy in the mediastinum. Multiple enlarged lymph nodes in the left axilla, measuring up to 2.5 x 3.3 cm in diameter, likely metastatic. Lungs/Pleura: Moderate bilateral pleural effusions. Atelectasis in the lung bases with patchy airspace infiltrates in the remaining lungs. This could represent edema or pneumonia. Noncalcified nodule in the right middle lung measuring 9 mm in diameter, likely  metastatic. Additional smaller nodules demonstrated in the right lower lung. Evaluation is limited due to motion artifact. Musculoskeletal: Diffuse skeletal metastasis with mixed sclerotic and lucent lesions throughout the visualized skeletal system. Review of the MIP images confirms the above findings. CT ABDOMEN and PELVIS FINDINGS Hepatobiliary: Multiple low-attenuation peripherally enhancing lesions demonstrated throughout the liver consistent with diffuse hepatic metastasis. Portal veins are patent. Gallbladder and bile ducts are unremarkable. Pancreas: Unremarkable. Morgan pancreatic ductal dilatation or surrounding inflammatory changes. Spleen: Normal in size without focal abnormality. Adrenals/Urinary Tract: Adrenal glands are unremarkable. Kidneys are normal, without renal calculi, focal lesion, or hydronephrosis. Bladder is unremarkable. Stomach/Bowel: Stomach is within normal limits. Appendix appears normal. Morgan evidence of bowel wall thickening, distention, or inflammatory changes. Vascular/Lymphatic: Aortic atherosclerosis. Morgan enlarged abdominal or pelvic lymph nodes. Reproductive: Uterus and bilateral adnexa are unremarkable. Other: Small amount of free fluid in the abdomen and pelvis, possibly reactive or may indicate peritoneal metastasis. Prominent upper abdominal varices. Edema in the subcutaneous fat throughout the abdominal wall. Morgan free air. Musculoskeletal: Diffuse skeletal metastasis with mixed sclerotic and lucent lesions for about the visualized skeletal elements. Review of the MIP images confirms the above findings. IMPRESSION: 1. Morgan evidence of significant pulmonary embolus. 2. Moderate bilateral pleural effusions with basilar atelectasis. Patchy airspace infiltrates in the remaining lungs may represent edema or pneumonia. 3. Noncalcified nodules in the right middle lung and right lower lung, likely metastatic. 4. Diffuse hepatic metastasis. 5. Diffuse skeletal metastasis. 6. Prominent upper  abdominal varices. 7. Small amount of free fluid in the abdomen and pelvis, may be reactive or may indicate peritoneal metastasis. 8. Edema in the subcutaneous fat throughout the abdominal wall. Aortic Atherosclerosis (ICD10-I70.0). Electronically Signed   By: Lucienne Capers M.D.   On: 02/27/2020 19:40   DG Chest Portable 1 View  Result Date: 02/27/2020 CLINICAL DATA:  Shortness of breath EXAM: PORTABLE CHEST 1 VIEW COMPARISON:  None. FINDINGS: There is hyperinflation of the lungs compatible with COPD. Small left pleural effusion with left base atelectasis. Right lung clear. Morgan acute bony abnormality. Multiple old healed right rib fractures. IMPRESSION: Small left pleural effusion with left base atelectasis. COPD. Electronically Signed   By: Rolm Baptise M.D.   On: 02/27/2020 17:56   ECHOCARDIOGRAM COMPLETE  Result Date: 02/28/2020    ECHOCARDIOGRAM REPORT   Patient Name:   Morgan Yu Date of Exam: 02/28/2020 Medical Rec #:  [id number]      Height: Accession #:    [id number]     Weight: Date of Birth:  March 21, 1940       BSA: Patient Age:    65 years       BP:           124/78 mmHg Patient Gender: F              HR:           100 bpm. Exam Location:  Inpatient Procedure: 2D Echo, Cardiac Doppler and Color Doppler Indications:    Dyspnea  History:        Patient has Morgan prior  history of Echocardiogram examinations.                 Signs/Symptoms:Dyspnea. New metastatic breast cancer, bilateral                 pleural effusions.  Sonographer:    Dustin Flock Referring Phys: [id number] [place]  1. Left ventricular ejection fraction, by estimation, is 65 to 70%. The left ventricle has normal function. The left ventricle has Morgan regional wall motion abnormalities. Left ventricular diastolic parameters are consistent with Grade I diastolic dysfunction (impaired relaxation).  2. Right ventricular systolic function is normal. The right ventricular size is normal. There is normal pulmonary artery  systolic pressure. The estimated right ventricular systolic pressure is 53.6 mmHg.  3. The mitral valve is normal in structure. Morgan evidence of mitral valve regurgitation. Morgan evidence of mitral stenosis.  4. The aortic valve is tricuspid. Aortic valve regurgitation is not visualized. Morgan aortic stenosis is present.  5. The inferior vena cava is normal in size with greater than 50% respiratory variability, suggesting right atrial pressure of 3 mmHg.  6. Left pleural effusion noted. FINDINGS  Left Ventricle: Left ventricular ejection fraction, by estimation, is 65 to 70%. The left ventricle has normal function. The left ventricle has Morgan regional wall motion abnormalities. The left ventricular internal cavity size was normal in size. There is  Morgan left ventricular hypertrophy. Left ventricular diastolic parameters are consistent with Grade I diastolic dysfunction (impaired relaxation). Right Ventricle: The right ventricular size is normal. Morgan increase in right ventricular wall thickness. Right ventricular systolic function is normal. There is normal pulmonary artery systolic pressure. The tricuspid regurgitant velocity is 2.66 m/s, and  with an assumed right atrial pressure of 3 mmHg, the estimated right ventricular systolic pressure is 64.4 mmHg. Left Atrium: Left atrial size was normal in size. Right Atrium: Right atrial size was normal in size. Pericardium: Left pleural effusion noted. There is Morgan evidence of pericardial effusion. Mitral Valve: The mitral valve is normal in structure. Mild mitral annular calcification. Morgan evidence of mitral valve regurgitation. Morgan evidence of mitral valve stenosis. Tricuspid Valve: The tricuspid valve is normal in structure. Tricuspid valve regurgitation is trivial. Aortic Valve: The aortic valve is tricuspid. Aortic valve regurgitation is not visualized. Morgan aortic stenosis is present. Aortic valve peak gradient measures 12.5 mmHg. Pulmonic Valve: The pulmonic valve was normal in  structure. Pulmonic valve regurgitation is not visualized. Aorta: The aortic root is normal in size and structure. Venous: The inferior vena cava is normal in size with greater than 50% respiratory variability, suggesting right atrial pressure of 3 mmHg. IAS/Shunts: Morgan atrial level shunt detected by color flow Doppler.  LEFT VENTRICLE PLAX 2D LVIDd:         3.70 cm  Diastology LVIDs:         2.40 cm  LV e' medial:    5.00 cm/s LV PW:         0.90 cm  LV E/e' medial:  13.0 LV IVS:        1.00 cm  LV e' lateral:   6.09 cm/s LVOT diam:     1.80 cm  LV E/e' lateral: 10.7 LV SV:         75 LVOT Area:     2.54 cm  RIGHT VENTRICLE RV Basal diam:  2.70 cm RV S prime:     18.50 cm/s TAPSE (M-mode): 3.5 cm LEFT ATRIUM  RIGHT ATRIUM LA diam:        2.80 cm RA Area:     11.60 cm LA Vol (A2C):   36.9 ml RA Volume:   24.70 ml LA Vol (A4C):   29.7 ml LA Biplane Vol: 33.8 ml  AORTIC VALVE AV Area (Vmax): 1.93 cm AV Vmax:        177.00 cm/s AV Peak Grad:   12.5 mmHg LVOT Vmax:      134.00 cm/s LVOT Vmean:     92.200 cm/s LVOT VTI:       0.294 m  AORTA Ao Root diam: 2.70 cm MITRAL VALVE                TRICUSPID VALVE MV Area (PHT): 5.66 cm     TR Peak grad:   28.3 mmHg MV Decel Time: 134 msec     TR Vmax:        266.00 cm/s MV E velocity: 64.90 cm/s MV A velocity: 109.00 cm/s  SHUNTS MV E/A ratio:  0.60         Systemic VTI:  0.29 m                             Systemic Diam: 1.80 cm Loralie Champagne MD Electronically signed by Loralie Champagne MD Signature Date/Time: 02/28/2020/6:28:13 PM    Final    [place] CORE BIOPSY (LYMPH NODES)  Result Date: 02/29/2020 INDICATION: 80 year old female with a history of breast carcinoma with metastatic disease. EXAM: IMAGE GUIDED BIOPSY OF LEFT CHEST WALL MASS/AXILLARY LYMPH NODE MEDICATIONS: None. ANESTHESIA/SEDATION: Moderate (conscious) sedation was employed during this procedure. A total of Versed 0.5 mg and Fentanyl 25 mcg was administered intravenously. Moderate Sedation Time: 10  minutes. The patient's level of consciousness and vital signs were monitored continuously by radiology nursing throughout the procedure under my direct supervision. FLUOROSCOPY TIME:  Ultrasound COMPLICATIONS: None PROCEDURE: Informed written consent was obtained from the patient after a thorough discussion of the procedural risks, benefits and alternatives. All questions were addressed. Maximal Sterile Barrier Technique was utilized including caps, mask, sterile gowns, sterile gloves, sterile drape, hand hygiene and skin antiseptic. A timeout was performed prior to the initiation of the procedure Ultrasound survey was performed with images stored and sent to PACs. The left axillary region was prepped with chlorhexidine in a sterile fashion, and a sterile drape was applied covering the operative field. A sterile gown and sterile gloves were used for the procedure. Local anesthesia was provided with 1% Lidocaine. Ultrasound guidance was used to infiltrate the region with 1% lidocaine for local anesthesia. Four separate 16 gauge core biopsy were then acquired of the left axillary mass/lymph node using ultrasound guidance. Images were stored. Final image was stored after biopsy. Patient tolerated the procedure well and remained hemodynamically stable throughout. Morgan complications were encountered and Morgan significant blood loss was encounter IMPRESSION: Status post ultrasound-guided left axillary mass biopsy. Signed, Dulcy Fanny. Dellia Nims, RPVI [facility] Radiology Electronically Signed   By: Corrie Mckusick D.O.   On: 02/29/2020 16:40   UE VENOUS DUPLEX ([facility] & [facility] 7 am - 7 pm)  Result Date: 02/27/2020 UPPER VENOUS STUDY  Indications: Swelling of LT forearm/wrist/hand. Limitations: Depth and size of vessels. Comparison Study: Morgan prior studies available. Performing Technologist: Darlin Coco RDMS  Examination Guidelines: A complete evaluation includes B-mode imaging, spectral  Doppler, color Doppler, and power Doppler as needed of all accessible  portions of each vessel. Bilateral testing is considered an integral part of a complete examination. Limited examinations for reoccurring indications may be performed as noted.  Right Findings: +----------+------------+---------+-----------+----------+-------+ RIGHT     CompressiblePhasicitySpontaneousPropertiesSummary +----------+------------+---------+-----------+----------+-------+ Subclavian               Yes       Yes                      +----------+------------+---------+-----------+----------+-------+  Left Findings: +----------+------------+---------+-----------+----------+-------+ LEFT      CompressiblePhasicitySpontaneousPropertiesSummary +----------+------------+---------+-----------+----------+-------+ IJV           Full       Yes       Yes                      +----------+------------+---------+-----------+----------+-------+ Subclavian    Full       Yes       Yes                      +----------+------------+---------+-----------+----------+-------+ Axillary      Full       Yes       Yes                      +----------+------------+---------+-----------+----------+-------+ Brachial      Full       Yes       Yes                      +----------+------------+---------+-----------+----------+-------+ Radial        Full                                          +----------+------------+---------+-----------+----------+-------+ Ulnar         Full                                          +----------+------------+---------+-----------+----------+-------+ Cephalic      Full                                          +----------+------------+---------+-----------+----------+-------+ Basilic       Full                                          +----------+------------+---------+-----------+----------+-------+  Summary:  Right: Morgan evidence of thrombosis in the  subclavian.  Left: Morgan evidence of deep vein thrombosis in the upper extremity. Morgan evidence of superficial vein thrombosis in the upper extremity.  Indicental: Appearance of enlarged, hypoechoic lymph node with loss of hilar definition in the axilla.  *See table(s) above for measurements and observations.  Diagnosing physician: Harold Barban MD Electronically signed by Harold Barban MD on 02/27/2020 at 9:42:15 PM.    Final     All questions were answered. The patient knows to call the clinic with any problems, questions or concerns. I spent 30 minutes in the care of the patient including H and P, review of records, discussion about treatment options, possible adverse effects of treatment and supportive care.  Morgan Pike, MD 03/06/2020 2:56 PM

## 2020-03-06 NOTE — Progress Notes (Signed)
CRITICAL VALUE STICKER  CRITICAL VALUE: AST [id number]  RECEIVER (on-site recipient of call):Susan.RN  DATE & TIME NOTIFIED: 03/06/20 @ 1610  MESSENGER (representative from lab):Jay  MD NOTIFIED: Dr. Chryl Heck  TIME OF NOTIFICATION: 03/06/20 @ 1615  RESPONSE: MD aware--better

## 2020-03-07 ENCOUNTER — Telehealth: Payer: Self-pay

## 2020-03-07 DIAGNOSIS — I071 Rheumatic tricuspid insufficiency: Secondary | ICD-10-CM | POA: Diagnosis not present

## 2020-03-07 DIAGNOSIS — J910 Malignant pleural effusion: Secondary | ICD-10-CM | POA: Diagnosis not present

## 2020-03-07 DIAGNOSIS — C50912 Malignant neoplasm of unspecified site of left female breast: Secondary | ICD-10-CM | POA: Diagnosis not present

## 2020-03-07 DIAGNOSIS — Z79899 Other long term (current) drug therapy: Secondary | ICD-10-CM | POA: Diagnosis not present

## 2020-03-07 DIAGNOSIS — R69 Illness, unspecified: Secondary | ICD-10-CM | POA: Diagnosis not present

## 2020-03-07 DIAGNOSIS — M19019 Primary osteoarthritis, unspecified shoulder: Secondary | ICD-10-CM | POA: Diagnosis not present

## 2020-03-07 DIAGNOSIS — C787 Secondary malignant neoplasm of liver and intrahepatic bile duct: Secondary | ICD-10-CM | POA: Diagnosis not present

## 2020-03-07 DIAGNOSIS — I509 Heart failure, unspecified: Secondary | ICD-10-CM | POA: Diagnosis not present

## 2020-03-07 DIAGNOSIS — J449 Chronic obstructive pulmonary disease, unspecified: Secondary | ICD-10-CM | POA: Diagnosis not present

## 2020-03-07 DIAGNOSIS — M6281 Muscle weakness (generalized): Secondary | ICD-10-CM | POA: Diagnosis not present

## 2020-03-07 DIAGNOSIS — C7951 Secondary malignant neoplasm of bone: Secondary | ICD-10-CM | POA: Diagnosis not present

## 2020-03-07 DIAGNOSIS — I058 Other rheumatic mitral valve diseases: Secondary | ICD-10-CM | POA: Diagnosis not present

## 2020-03-07 DIAGNOSIS — Z4803 Encounter for change or removal of drains: Secondary | ICD-10-CM | POA: Diagnosis not present

## 2020-03-07 DIAGNOSIS — Z9181 History of falling: Secondary | ICD-10-CM | POA: Diagnosis not present

## 2020-03-07 DIAGNOSIS — M5431 Sciatica, right side: Secondary | ICD-10-CM | POA: Diagnosis not present

## 2020-03-07 DIAGNOSIS — J42 Unspecified chronic bronchitis: Secondary | ICD-10-CM | POA: Diagnosis not present

## 2020-03-07 DIAGNOSIS — Z4801 Encounter for change or removal of surgical wound dressing: Secondary | ICD-10-CM | POA: Diagnosis not present

## 2020-03-07 LAB — SURGICAL PATHOLOGY

## 2020-03-07 LAB — CANCER ANTIGEN 15-3: CA 15-3: 1107 U/mL — ABNORMAL HIGH (ref 0.0–25.0)

## 2020-03-07 NOTE — Telephone Encounter (Signed)
Spoke with patient's cousin/POA Gayle and scheduled an in-person Palliative Consult for 03/21/20 @ 1:30PM  COVID screening was negative. Patient has a dog and cat in the home, Edd Fabian will put away before NP arrives. Patient lives alone, but Edd Fabian will be at consult.  Consent obtained; updated Outlook/Netsmart/Team List and Epic.  Family is aware they will be receiving a call from NP the day before or day of to confirm appointment.

## 2020-03-11 ENCOUNTER — Encounter: Payer: Self-pay | Admitting: Hematology and Oncology

## 2020-03-12 ENCOUNTER — Other Ambulatory Visit: Payer: Self-pay | Admitting: Hematology and Oncology

## 2020-03-12 ENCOUNTER — Encounter: Payer: Self-pay | Admitting: Hematology and Oncology

## 2020-03-12 DIAGNOSIS — C787 Secondary malignant neoplasm of liver and intrahepatic bile duct: Secondary | ICD-10-CM | POA: Diagnosis not present

## 2020-03-12 DIAGNOSIS — I071 Rheumatic tricuspid insufficiency: Secondary | ICD-10-CM | POA: Diagnosis not present

## 2020-03-12 DIAGNOSIS — I058 Other rheumatic mitral valve diseases: Secondary | ICD-10-CM | POA: Diagnosis not present

## 2020-03-12 DIAGNOSIS — Z79899 Other long term (current) drug therapy: Secondary | ICD-10-CM | POA: Diagnosis not present

## 2020-03-12 DIAGNOSIS — Z9181 History of falling: Secondary | ICD-10-CM | POA: Diagnosis not present

## 2020-03-12 DIAGNOSIS — C7951 Secondary malignant neoplasm of bone: Secondary | ICD-10-CM

## 2020-03-12 DIAGNOSIS — J42 Unspecified chronic bronchitis: Secondary | ICD-10-CM | POA: Diagnosis not present

## 2020-03-12 DIAGNOSIS — M5431 Sciatica, right side: Secondary | ICD-10-CM | POA: Diagnosis not present

## 2020-03-12 DIAGNOSIS — C50912 Malignant neoplasm of unspecified site of left female breast: Secondary | ICD-10-CM | POA: Diagnosis not present

## 2020-03-12 DIAGNOSIS — J910 Malignant pleural effusion: Secondary | ICD-10-CM | POA: Diagnosis not present

## 2020-03-12 MED ORDER — ANASTROZOLE 1 MG PO TABS: 1.0000 mg | ORAL_TABLET | Freq: Every day | ORAL | 1 refills | Status: DC

## 2020-03-12 NOTE — Progress Notes (Signed)
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 Given metastatic disease, I have recommended trying chemo with her 2 directed therapy front line. She is opposed to chemotherapy especially because she is very weak at this time. She would however want to think about this and come see me next week before making any decisions. Will make an appointment for her next week.

## 2020-03-12 NOTE — Progress Notes (Signed)
In the mean time I ordered ECHO for preparation for possibly herceptin use. I also ordered arimidex daily, since tumor is ER positive. We have previously discussed and today about the adverse effects of arimidex including but not limited to fatigue, post menopausal symptoms, arthralgias etc.  Increased risk of cardiovascular events has been noted with AI but her benefits over weigh risks Patient has previously expressed understanding and was willing to try antiestrogen therapy. Morgan Yu also expressed understanding of the plan.

## 2020-03-13 DIAGNOSIS — C50912 Malignant neoplasm of unspecified site of left female breast: Secondary | ICD-10-CM | POA: Diagnosis not present

## 2020-03-13 DIAGNOSIS — Z9181 History of falling: Secondary | ICD-10-CM | POA: Diagnosis not present

## 2020-03-13 DIAGNOSIS — Z79899 Other long term (current) drug therapy: Secondary | ICD-10-CM | POA: Diagnosis not present

## 2020-03-13 DIAGNOSIS — I071 Rheumatic tricuspid insufficiency: Secondary | ICD-10-CM | POA: Diagnosis not present

## 2020-03-13 DIAGNOSIS — J42 Unspecified chronic bronchitis: Secondary | ICD-10-CM | POA: Diagnosis not present

## 2020-03-13 DIAGNOSIS — M5431 Sciatica, right side: Secondary | ICD-10-CM | POA: Diagnosis not present

## 2020-03-13 DIAGNOSIS — J910 Malignant pleural effusion: Secondary | ICD-10-CM | POA: Diagnosis not present

## 2020-03-13 DIAGNOSIS — C787 Secondary malignant neoplasm of liver and intrahepatic bile duct: Secondary | ICD-10-CM | POA: Diagnosis not present

## 2020-03-13 DIAGNOSIS — I058 Other rheumatic mitral valve diseases: Secondary | ICD-10-CM | POA: Diagnosis not present

## 2020-03-13 NOTE — Telephone Encounter (Signed)
Pt called back indicating she missed a call. I advised the pt a rx of Zofran has been sent to help with her nausea and Dr. Chryl Heck with discuss her sleep concerns with her during her next appointment. Pt expressed understanding of this information.

## 2020-03-17 DIAGNOSIS — Z79899 Other long term (current) drug therapy: Secondary | ICD-10-CM | POA: Diagnosis not present

## 2020-03-17 DIAGNOSIS — I071 Rheumatic tricuspid insufficiency: Secondary | ICD-10-CM | POA: Diagnosis not present

## 2020-03-17 DIAGNOSIS — C787 Secondary malignant neoplasm of liver and intrahepatic bile duct: Secondary | ICD-10-CM | POA: Diagnosis not present

## 2020-03-17 DIAGNOSIS — J910 Malignant pleural effusion: Secondary | ICD-10-CM | POA: Diagnosis not present

## 2020-03-17 DIAGNOSIS — Z9181 History of falling: Secondary | ICD-10-CM | POA: Diagnosis not present

## 2020-03-17 DIAGNOSIS — J42 Unspecified chronic bronchitis: Secondary | ICD-10-CM | POA: Diagnosis not present

## 2020-03-17 DIAGNOSIS — C50912 Malignant neoplasm of unspecified site of left female breast: Secondary | ICD-10-CM | POA: Diagnosis not present

## 2020-03-17 DIAGNOSIS — M5431 Sciatica, right side: Secondary | ICD-10-CM | POA: Diagnosis not present

## 2020-03-17 DIAGNOSIS — I058 Other rheumatic mitral valve diseases: Secondary | ICD-10-CM | POA: Diagnosis not present

## 2020-03-18 ENCOUNTER — Other Ambulatory Visit: Payer: Self-pay

## 2020-03-18 DIAGNOSIS — C7951 Secondary malignant neoplasm of bone: Secondary | ICD-10-CM

## 2020-03-19 ENCOUNTER — Inpatient Hospital Stay: Payer: Medicare HMO | Admitting: Hematology and Oncology

## 2020-03-19 ENCOUNTER — Encounter: Payer: Self-pay | Admitting: Hematology and Oncology

## 2020-03-19 ENCOUNTER — Other Ambulatory Visit: Payer: Self-pay

## 2020-03-19 ENCOUNTER — Telehealth: Payer: Self-pay | Admitting: Hematology and Oncology

## 2020-03-19 VITALS — BP 146/79 | HR 18 | Temp 97.7°F | Resp 19 | Ht 60.0 in | Wt 93.5 lb

## 2020-03-19 DIAGNOSIS — C50912 Malignant neoplasm of unspecified site of left female breast: Secondary | ICD-10-CM | POA: Diagnosis not present

## 2020-03-19 DIAGNOSIS — C773 Secondary and unspecified malignant neoplasm of axilla and upper limb lymph nodes: Secondary | ICD-10-CM | POA: Diagnosis not present

## 2020-03-19 DIAGNOSIS — C787 Secondary malignant neoplasm of liver and intrahepatic bile duct: Secondary | ICD-10-CM | POA: Diagnosis not present

## 2020-03-19 DIAGNOSIS — K5900 Constipation, unspecified: Secondary | ICD-10-CM

## 2020-03-19 DIAGNOSIS — R600 Localized edema: Secondary | ICD-10-CM | POA: Diagnosis not present

## 2020-03-19 DIAGNOSIS — R609 Edema, unspecified: Secondary | ICD-10-CM | POA: Diagnosis not present

## 2020-03-19 DIAGNOSIS — Z7983 Long term (current) use of bisphosphonates: Secondary | ICD-10-CM | POA: Diagnosis not present

## 2020-03-19 DIAGNOSIS — C7951 Secondary malignant neoplasm of bone: Secondary | ICD-10-CM

## 2020-03-19 DIAGNOSIS — C50919 Malignant neoplasm of unspecified site of unspecified female breast: Secondary | ICD-10-CM | POA: Diagnosis not present

## 2020-03-19 DIAGNOSIS — R918 Other nonspecific abnormal finding of lung field: Secondary | ICD-10-CM | POA: Diagnosis not present

## 2020-03-19 DIAGNOSIS — I890 Lymphedema, not elsewhere classified: Secondary | ICD-10-CM | POA: Diagnosis not present

## 2020-03-19 DIAGNOSIS — R531 Weakness: Secondary | ICD-10-CM | POA: Diagnosis not present

## 2020-03-19 DIAGNOSIS — R7401 Elevation of levels of liver transaminase levels: Secondary | ICD-10-CM | POA: Diagnosis not present

## 2020-03-19 DIAGNOSIS — Z170 Estrogen receptor positive status [ER+]: Secondary | ICD-10-CM | POA: Diagnosis not present

## 2020-03-19 DIAGNOSIS — Z5181 Encounter for therapeutic drug level monitoring: Secondary | ICD-10-CM

## 2020-03-19 NOTE — Progress Notes (Signed)
START ON PATHWAY REGIMEN - Breast     Cycle 1: A cycle is 21 days:     Trastuzumab-xxxx    Cycles 2 and beyond: A cycle is every 21 days:     Trastuzumab-xxxx   **Always confirm dose/schedule in your pharmacy ordering system**  Patient Characteristics: Distant Metastases or Locoregional Recurrent Disease - Unresected or Locally Advanced Unresectable Disease Progressing after Neoadjuvant and Local Therapies, HER2 Positive, ER Positive, HER2-Targeted Therapy (Concurrent with Endocrine Therapy) Therapeutic Status: Distant Metastases ER Status: Positive (+) HER2 Status: Positive (+) PR Status: Negative (-) Intent of Therapy: Non-Curative / Palliative Intent, Discussed with Patient

## 2020-03-19 NOTE — Progress Notes (Signed)
[name] FOLLOW UP NOTE  Patient Care Team: Patient, No Pcp Per as PCP - General (General Practice) [name], MD as Consulting Physician (Hematology and Oncology)  CHIEF COMPLAINTS/PURPOSE OF CONSULTATION:  New diagnosis of metastatic breast cancer.  ASSESSMENT & PLAN:  No problem-specific Assessment & Plan notes found for this encounter.  No orders of the defined types were placed in this encounter.  Metastatic breast cancer with pulmonary nodules, liver masses and bone lesions.  1. Discussed biopsy results of the chest wall/left axillary mass which were consistent with metastatic carcinoma, ER positive, PR negative, Her 2 amplified. - Given her older age and co-morbidities, we will start [place] herceptin and anastrazole and if she tolerates it well, we will plan adding pertuzumab. - she started arimidex on 03/11/2020, doing well so far. -We again discussed that her metastatic breast cancer is not curable but is treatable. - She is doing remarkably well compared to last visit, gaining weight and strength back  2. Hypercalcemia likely due to malignancy Resolved.   3. Left arm lymphedema due to large lymphadenopathy in the left axilla Discussed palliative radiation to the axilla to help alleviate this since she is not going to do aggressive chemotherapy  4. Constipation, she had a decent bowel movement yesterday She can try miralax or dulcolax for constipation. Can consider it doing daily PRN for now until she has diarrhea.  5. BLE ankle edema She was advised to raise her legs when she is sitting down Overall mild, so can monitor.  6. Bone mets, will start her on zometa monthly and transition to every 3 months.  Transaminitis related to metastatic disease.  HISTORY OF PRESENTING ILLNESS:  Morgan Yu 80 y.o. female is here because of newly diagnosed metastatic breast cancer.  Oncology History   No history exists.    Morgan Yu is a 80 year old  female with no significant past medical history presented to the emergency department with weakness.  She has a left breast mass and developed left arm swelling.  The patient thinks that the breast mass and swelling have been present for at least a few weeks to months.  She states that she initially developed pruritus and later a lump involving the left breast.  Left arm swelling has been more recent.  Last mammogram was many years ago.  CTA chest and CT of the abdomen pelvis were performed which showed no evidence of PE but did show moderate bilateral pleural effusions, nodules in the right middle lung and right lower lung likely metastatic, diffuse hepatic metastasis, diffuse skeletal metastasis.    The patient is widowed and lives alone.  She has no biological children but has 2 stepchildren.  She has friends and family who check on her.  Denies history of alcohol tobacco use.    Family history significant for a maternal aunt with esophageal cancer (smoker) and a paternal aunt with breast cancer in her 64s.  The patient reports menarche around age 66 and menopause at around age 36.  Used hormone replacement therapy for about 1 year around age 65.    Pathology confirmed metastatic carcinoma, breast primary, ER positive PR negative and Her 2 amplified by IHC  INTERIM HISTORY  She feels better, gained some weight. Appetite varies, trying to eat small meals. BLE ankle swelling noted. Constipation, had a decent BM yesterday, Left arm swelling bothers her. No other bone pains.  REVIEW OF SYSTEMS:   Constitutional: Denies fevers, chills or abnormal night sweats Eyes: Denies  blurriness of vision, double vision or watery eyes Ears, nose, mouth, throat, and face: Denies mucositis or sore throat Respiratory: Denies cough, dyspnea or wheezes Cardiovascular: Denies palpitation, chest discomfort or lower extremity swelling Gastrointestinal:  Denies nausea, heartburn or change in bowel habits Skin:  Denies abnormal skin rashes Lymphatics: Denies new lymphadenopathy or easy bruising Neurological:Denies numbness, tingling or new weaknesses Behavioral/Psych: Mood is stable, no new changes  All other systems were reviewed with the patient and are negative.   MEDICAL HISTORY:  History reviewed. No pertinent past medical history.  SURGICAL HISTORY: History reviewed. No pertinent surgical history.  SOCIAL HISTORY: Social History   Socioeconomic History  . Marital status: Widowed    Spouse name: Not on file  . Number of children: Not on file  . Years of education: Not on file  . Highest education level: Not on file  Occupational History  . Not on file  Tobacco Use  . Smoking status: Never Smoker  . Smokeless tobacco: Never Used  Substance and Sexual Activity  . Alcohol use: Not Currently  . Drug use: Never  . Sexual activity: Not on file  Other Topics Concern  . Not on file  Social History Narrative   No biological children   Has 2 stepchildren   Social Determinants of Health   Financial Resource Strain: Not on file  Food Insecurity: Not on file  Transportation Needs: Not on file  Physical Activity: Not on file  Stress: Not on file  Social Connections: Not on file  Intimate Partner Violence: Not on file    FAMILY HISTORY: Family History  Problem Relation Age of Onset  . Stroke Mother   . Heart attack Father   . Cancer Brother   . Esophageal cancer Maternal Aunt   . Breast cancer Maternal Aunt     ALLERGIES:  is allergic to vicodin hp [hydrocodone-acetaminophen].  MEDICATIONS:  Current Outpatient Medications  Medication Sig Dispense Refill  . acetaminophen (TYLENOL) 500 MG tablet Take 500 mg by mouth every 6 (six) hours as needed for moderate pain.    Marland Kitchen anastrozole (ARIMIDEX) 1 MG tablet Take 1 tablet (1 mg total) by mouth daily. 30 tablet 1  . mirtazapine (REMERON) 15 MG tablet Take 1 tablet (15 mg total) by mouth at bedtime. 30 tablet 0  . Probiotic  Product (PROBIOTIC PO) Take 1 capsule by mouth daily.    . feeding supplement (ENSURE ENLIVE / ENSURE PLUS) LIQD Take 237 mLs by mouth 3 (three) times daily between meals. (Patient not taking: Reported on 03/19/2020) 237 mL 12   No current facility-administered medications for this visit.    PHYSICAL EXAMINATION:  ECOG PERFORMANCE STATUS: 0 - Asymptomatic  Vitals:   03/19/20 1337  BP: (!) 146/79  Pulse: (!) 18  Resp: 19  Temp: 97.7 F (36.5 C)  SpO2: 97%   Filed Weights   03/19/20 1337  Weight: 93 lb 8 oz (42.4 kg)    GENERAL: alert, no distress and comfortable, in a wheel chair SKIN: skin color, texture, turgor are normal, no rashes or significant lesions Lymphatics: Multiple palpable cervical lymph nodes, palpable bilateral axillary lymph nodes BREAST: Left breast with erythema, skin involvement noted, palpable mass in the left breast occupying pretty much the entire breast,  Respiratory: lungs were clear bilaterally without wheezing or crackles.   Cardiovascular:  Regular rate and rhythm, S1/S2, without murmur, rub or gallop.  There was no pedal edema.   GI:  abdomen was soft, flat, nontender, nondistended, without  organomegaly.   Musculoskeletal: asymmetrial LUE swelling due to large LN Skin exam was without echymosis, petichae.   Neuro exam was nonfocal. Patient was alert and oriented.  Attention was good.   Language was appropriate.  Mood was normal without depression.  Speech was not pressured.  Thought content was not tangential.    LABORATORY DATA:  I have reviewed the data as listed Lab Results  Component Value Date   WBC 10.5 03/06/2020   HGB 12.6 03/06/2020   HCT 38.3 03/06/2020   MCV 82.2 03/06/2020   PLT 361 03/06/2020     Chemistry      Component Value Date/Time   NA 134 (L) 03/06/2020 1528   K 3.5 03/06/2020 1528   CL 99 03/06/2020 1528   CO2 22 03/06/2020 1528   BUN 33 (H) 03/06/2020 1528   CREATININE 0.82 03/06/2020 1528      Component Value  Date/Time   CALCIUM 8.1 (L) 03/06/2020 1528   ALKPHOS 1,299 (H) 03/06/2020 1528   AST 181 (HH) 03/06/2020 1528   ALT 33 03/06/2020 1528   BILITOT 1.3 (H) 03/06/2020 1528       RADIOGRAPHIC STUDIES: I have personally reviewed the radiological images as listed and agreed with the findings in the report. DG Chest 2 View  Result Date: 02/29/2020 CLINICAL DATA:  Pleural effusions, weakness EXAM: CHEST - 2 VIEW COMPARISON:  Radiograph 02/27/2020 FINDINGS: Normal cardiac silhouette. Small LEFT effusion. Lungs are hyperinflated. Chronic bronchitic markings. No pneumothorax. No focal consolidation. Severe degenerative changes in the glenoid fossa. IMPRESSION: 1. Small LEFT effusion. 2. Hyperinflated lungs and chronic bronchitic markings. Electronically Signed   By: Suzy Bouchard M.D.   On: 02/29/2020 12:38   CT Angio Chest PE W and/or Wo Contrast  Result Date: 02/27/2020 CLINICAL DATA:  Acute abdominal pain. Elevated liver function test. Suspected breast cancer. Query metastasis. Query shortness of breath. Pulmonary embolus suspected with high probability. EXAM: CT ANGIOGRAPHY CHEST CT ABDOMEN AND PELVIS WITH CONTRAST TECHNIQUE: Multidetector CT imaging of the chest was performed using the standard protocol during bolus administration of intravenous contrast. Multiplanar CT image reconstructions and MIPs were obtained to evaluate the vascular anatomy. Multidetector CT imaging of the abdomen and pelvis was performed using the standard protocol during bolus administration of intravenous contrast. CONTRAST:  122mL OMNIPAQUE IOHEXOL 350 MG/ML SOLN COMPARISON:  Chest radiograph 02/27/2020 FINDINGS: CTA CHEST FINDINGS Cardiovascular: Satisfactory opacification of the pulmonary arteries to the segmental level. No evidence of pulmonary embolism. Normal heart size. No pericardial effusion. Normal caliber thoracic aorta. No aortic dissection. Mediastinum/Nodes: Esophagus is decompressed. No significant  lymphadenopathy in the mediastinum. Multiple enlarged lymph nodes in the left axilla, measuring up to 2.5 x 3.3 cm in diameter, likely metastatic. Lungs/Pleura: Moderate bilateral pleural effusions. Atelectasis in the lung bases with patchy airspace infiltrates in the remaining lungs. This could represent edema or pneumonia. Noncalcified nodule in the right middle lung measuring 9 mm in diameter, likely metastatic. Additional smaller nodules demonstrated in the right lower lung. Evaluation is limited due to motion artifact. Musculoskeletal: Diffuse skeletal metastasis with mixed sclerotic and lucent lesions throughout the visualized skeletal system. Review of the MIP images confirms the above findings. CT ABDOMEN and PELVIS FINDINGS Hepatobiliary: Multiple low-attenuation peripherally enhancing lesions demonstrated throughout the liver consistent with diffuse hepatic metastasis. Portal veins are patent. Gallbladder and bile ducts are unremarkable. Pancreas: Unremarkable. No pancreatic ductal dilatation or surrounding inflammatory changes. Spleen: Normal in size without focal abnormality. Adrenals/Urinary Tract: Adrenal glands are unremarkable. Kidneys  are normal, without renal calculi, focal lesion, or hydronephrosis. Bladder is unremarkable. Stomach/Bowel: Stomach is within normal limits. Appendix appears normal. No evidence of bowel wall thickening, distention, or inflammatory changes. Vascular/Lymphatic: Aortic atherosclerosis. No enlarged abdominal or pelvic lymph nodes. Reproductive: Uterus and bilateral adnexa are unremarkable. Other: Small amount of free fluid in the abdomen and pelvis, possibly reactive or may indicate peritoneal metastasis. Prominent upper abdominal varices. Edema in the subcutaneous fat throughout the abdominal wall. No free air. Musculoskeletal: Diffuse skeletal metastasis with mixed sclerotic and lucent lesions for about the visualized skeletal elements. Review of the MIP images confirms  the above findings. IMPRESSION: 1. No evidence of significant pulmonary embolus. 2. Moderate bilateral pleural effusions with basilar atelectasis. Patchy airspace infiltrates in the remaining lungs may represent edema or pneumonia. 3. Noncalcified nodules in the right middle lung and right lower lung, likely metastatic. 4. Diffuse hepatic metastasis. 5. Diffuse skeletal metastasis. 6. Prominent upper abdominal varices. 7. Small amount of free fluid in the abdomen and pelvis, may be reactive or may indicate peritoneal metastasis. 8. Edema in the subcutaneous fat throughout the abdominal wall. Aortic Atherosclerosis (ICD10-I70.0). Electronically Signed   By: Lucienne Capers M.D.   On: 02/27/2020 19:40   MR Brain W Wo Contrast  Result Date: 02/28/2020 CLINICAL DATA:  Breast cancer, staging EXAM: MRI HEAD WITHOUT AND WITH CONTRAST TECHNIQUE: Multiplanar, multiecho pulse sequences of the brain and surrounding structures were obtained without and with intravenous contrast. CONTRAST:  57mL GADAVIST GADOBUTROL 1 MMOL/ML IV SOLN COMPARISON:  None. FINDINGS: Brain: There is no acute infarction or intracranial hemorrhage. There is no intracranial mass, mass effect, or edema. There is no hydrocephalus or extra-axial fluid collection. Prominence of the ventricles and sulci reflects minor generalized parenchymal volume loss. Patchy foci of T2 hyperintensity in the supratentorial white matter are nonspecific but probably reflect mild chronic microvascular ischemic changes. Punctate focus of susceptibility in the right temporal subcortical white matter likely reflects chronic microhemorrhage. No abnormal enhancement. Vascular: Major vessel flow voids at the skull base are preserved. Skull and upper cervical spine: Abnormal T1 marrow signal in the visualized cervical spine. Patchy abnormal marrow signal the calvarium and skull base. Sinuses/Orbits: Paranasal sinuses are aerated. Orbits are unremarkable. Other: Sella is  unremarkable.  Mastoid air cells are clear. IMPRESSION: No evidence of intracranial metastatic disease. Diffuse osseous metastatic disease. Mild chronic microvascular ischemic changes. Electronically Signed   By: Macy Mis M.D.   On: 02/28/2020 13:57   CT ABDOMEN PELVIS W CONTRAST  Result Date: 02/27/2020 CLINICAL DATA:  Acute abdominal pain. Elevated liver function test. Suspected breast cancer. Query metastasis. Query shortness of breath. Pulmonary embolus suspected with high probability. EXAM: CT ANGIOGRAPHY CHEST CT ABDOMEN AND PELVIS WITH CONTRAST TECHNIQUE: Multidetector CT imaging of the chest was performed using the standard protocol during bolus administration of intravenous contrast. Multiplanar CT image reconstructions and MIPs were obtained to evaluate the vascular anatomy. Multidetector CT imaging of the abdomen and pelvis was performed using the standard protocol during bolus administration of intravenous contrast. CONTRAST:  176mL OMNIPAQUE IOHEXOL 350 MG/ML SOLN COMPARISON:  Chest radiograph 02/27/2020 FINDINGS: CTA CHEST FINDINGS Cardiovascular: Satisfactory opacification of the pulmonary arteries to the segmental level. No evidence of pulmonary embolism. Normal heart size. No pericardial effusion. Normal caliber thoracic aorta. No aortic dissection. Mediastinum/Nodes: Esophagus is decompressed. No significant lymphadenopathy in the mediastinum. Multiple enlarged lymph nodes in the left axilla, measuring up to 2.5 x 3.3 cm in diameter, likely metastatic. Lungs/Pleura: Moderate bilateral pleural  effusions. Atelectasis in the lung bases with patchy airspace infiltrates in the remaining lungs. This could represent edema or pneumonia. Noncalcified nodule in the right middle lung measuring 9 mm in diameter, likely metastatic. Additional smaller nodules demonstrated in the right lower lung. Evaluation is limited due to motion artifact. Musculoskeletal: Diffuse skeletal metastasis with mixed  sclerotic and lucent lesions throughout the visualized skeletal system. Review of the MIP images confirms the above findings. CT ABDOMEN and PELVIS FINDINGS Hepatobiliary: Multiple low-attenuation peripherally enhancing lesions demonstrated throughout the liver consistent with diffuse hepatic metastasis. Portal veins are patent. Gallbladder and bile ducts are unremarkable. Pancreas: Unremarkable. No pancreatic ductal dilatation or surrounding inflammatory changes. Spleen: Normal in size without focal abnormality. Adrenals/Urinary Tract: Adrenal glands are unremarkable. Kidneys are normal, without renal calculi, focal lesion, or hydronephrosis. Bladder is unremarkable. Stomach/Bowel: Stomach is within normal limits. Appendix appears normal. No evidence of bowel wall thickening, distention, or inflammatory changes. Vascular/Lymphatic: Aortic atherosclerosis. No enlarged abdominal or pelvic lymph nodes. Reproductive: Uterus and bilateral adnexa are unremarkable. Other: Small amount of free fluid in the abdomen and pelvis, possibly reactive or may indicate peritoneal metastasis. Prominent upper abdominal varices. Edema in the subcutaneous fat throughout the abdominal wall. No free air. Musculoskeletal: Diffuse skeletal metastasis with mixed sclerotic and lucent lesions for about the visualized skeletal elements. Review of the MIP images confirms the above findings. IMPRESSION: 1. No evidence of significant pulmonary embolus. 2. Moderate bilateral pleural effusions with basilar atelectasis. Patchy airspace infiltrates in the remaining lungs may represent edema or pneumonia. 3. Noncalcified nodules in the right middle lung and right lower lung, likely metastatic. 4. Diffuse hepatic metastasis. 5. Diffuse skeletal metastasis. 6. Prominent upper abdominal varices. 7. Small amount of free fluid in the abdomen and pelvis, may be reactive or may indicate peritoneal metastasis. 8. Edema in the subcutaneous fat throughout the  abdominal wall. Aortic Atherosclerosis (ICD10-I70.0). Electronically Signed   By: Lucienne Capers M.D.   On: 02/27/2020 19:40   DG Chest Portable 1 View  Result Date: 02/27/2020 CLINICAL DATA:  Shortness of breath EXAM: PORTABLE CHEST 1 VIEW COMPARISON:  None. FINDINGS: There is hyperinflation of the lungs compatible with COPD. Small left pleural effusion with left base atelectasis. Right lung clear. No acute bony abnormality. Multiple old healed right rib fractures. IMPRESSION: Small left pleural effusion with left base atelectasis. COPD. Electronically Signed   By: Rolm Baptise M.D.   On: 02/27/2020 17:56   ECHOCARDIOGRAM COMPLETE  Result Date: 02/28/2020    ECHOCARDIOGRAM REPORT   Patient Name:   KHAMIA STAMBAUGH Date of Exam: 02/28/2020 Medical Rec #:  [id number]      Height: Accession #:    [id number]     Weight: Date of Birth:  Mar 23, 1940       BSA: Patient Age:    60 years       BP:           124/78 mmHg Patient Gender: F              HR:           100 bpm. Exam Location:  Inpatient Procedure: 2D Echo, Cardiac Doppler and Color Doppler Indications:    Dyspnea  History:        Patient has no prior history of Echocardiogram examinations.                 Signs/Symptoms:Dyspnea. New metastatic breast cancer, bilateral  pleural effusions.  Sonographer:    Dustin Flock Referring Phys: [phone] [place]  1. Left ventricular ejection fraction, by estimation, is 65 to 70%. The left ventricle has normal function. The left ventricle has no regional wall motion abnormalities. Left ventricular diastolic parameters are consistent with Grade I diastolic dysfunction (impaired relaxation).  2. Right ventricular systolic function is normal. The right ventricular size is normal. There is normal pulmonary artery systolic pressure. The estimated right ventricular systolic pressure is 95.0 mmHg.  3. The mitral valve is normal in structure. No evidence of mitral valve regurgitation. No  evidence of mitral stenosis.  4. The aortic valve is tricuspid. Aortic valve regurgitation is not visualized. No aortic stenosis is present.  5. The inferior vena cava is normal in size with greater than 50% respiratory variability, suggesting right atrial pressure of 3 mmHg.  6. Left pleural effusion noted. FINDINGS  Left Ventricle: Left ventricular ejection fraction, by estimation, is 65 to 70%. The left ventricle has normal function. The left ventricle has no regional wall motion abnormalities. The left ventricular internal cavity size was normal in size. There is  no left ventricular hypertrophy. Left ventricular diastolic parameters are consistent with Grade I diastolic dysfunction (impaired relaxation). Right Ventricle: The right ventricular size is normal. No increase in right ventricular wall thickness. Right ventricular systolic function is normal. There is normal pulmonary artery systolic pressure. The tricuspid regurgitant velocity is 2.66 m/s, and  with an assumed right atrial pressure of 3 mmHg, the estimated right ventricular systolic pressure is 93.2 mmHg. Left Atrium: Left atrial size was normal in size. Right Atrium: Right atrial size was normal in size. Pericardium: Left pleural effusion noted. There is no evidence of pericardial effusion. Mitral Valve: The mitral valve is normal in structure. Mild mitral annular calcification. No evidence of mitral valve regurgitation. No evidence of mitral valve stenosis. Tricuspid Valve: The tricuspid valve is normal in structure. Tricuspid valve regurgitation is trivial. Aortic Valve: The aortic valve is tricuspid. Aortic valve regurgitation is not visualized. No aortic stenosis is present. Aortic valve peak gradient measures 12.5 mmHg. Pulmonic Valve: The pulmonic valve was normal in structure. Pulmonic valve regurgitation is not visualized. Aorta: The aortic root is normal in size and structure. Venous: The inferior vena cava is normal in size with greater  than 50% respiratory variability, suggesting right atrial pressure of 3 mmHg. IAS/Shunts: No atrial level shunt detected by color flow Doppler.  LEFT VENTRICLE PLAX 2D LVIDd:         3.70 cm  Diastology LVIDs:         2.40 cm  LV e' medial:    5.00 cm/s LV PW:         0.90 cm  LV E/e' medial:  13.0 LV IVS:        1.00 cm  LV e' lateral:   6.09 cm/s LVOT diam:     1.80 cm  LV E/e' lateral: 10.7 LV SV:         75 LVOT Area:     2.54 cm  RIGHT VENTRICLE RV Basal diam:  2.70 cm RV S prime:     18.50 cm/s TAPSE (M-mode): 3.5 cm LEFT ATRIUM             RIGHT ATRIUM LA diam:        2.80 cm RA Area:     11.60 cm LA Vol (A2C):   36.9 ml RA Volume:   24.70 ml LA Vol (A4C):  29.7 ml LA Biplane Vol: 33.8 ml  AORTIC VALVE AV Area (Vmax): 1.93 cm AV Vmax:        177.00 cm/s AV Peak Grad:   12.5 mmHg LVOT Vmax:      134.00 cm/s LVOT Vmean:     92.200 cm/s LVOT VTI:       0.294 m  AORTA Ao Root diam: 2.70 cm MITRAL VALVE                TRICUSPID VALVE MV Area (PHT): 5.66 cm     TR Peak grad:   28.3 mmHg MV Decel Time: 134 msec     TR Vmax:        266.00 cm/s MV E velocity: 64.90 cm/s MV A velocity: 109.00 cm/s  SHUNTS MV E/A ratio:  0.60         Systemic VTI:  0.29 m                             Systemic Diam: 1.80 cm Loralie Champagne MD Electronically signed by Loralie Champagne MD Signature Date/Time: 02/28/2020/6:28:13 PM    Final    [place] CORE BIOPSY (LYMPH NODES)  Result Date: 02/29/2020 INDICATION: 80 year old female with a history of breast carcinoma with metastatic disease. EXAM: IMAGE GUIDED BIOPSY OF LEFT CHEST WALL MASS/AXILLARY LYMPH NODE MEDICATIONS: None. ANESTHESIA/SEDATION: Moderate (conscious) sedation was employed during this procedure. A total of Versed 0.5 mg and Fentanyl 25 mcg was administered intravenously. Moderate Sedation Time: 10 minutes. The patient's level of consciousness and vital signs were monitored continuously by radiology nursing throughout the procedure under my direct supervision. FLUOROSCOPY  TIME:  Ultrasound COMPLICATIONS: None PROCEDURE: Informed written consent was obtained from the patient after a thorough discussion of the procedural risks, benefits and alternatives. All questions were addressed. Maximal Sterile Barrier Technique was utilized including caps, mask, sterile gowns, sterile gloves, sterile drape, hand hygiene and skin antiseptic. A timeout was performed prior to the initiation of the procedure Ultrasound survey was performed with images stored and sent to PACs. The left axillary region was prepped with chlorhexidine in a sterile fashion, and a sterile drape was applied covering the operative field. A sterile gown and sterile gloves were used for the procedure. Local anesthesia was provided with 1% Lidocaine. Ultrasound guidance was used to infiltrate the region with 1% lidocaine for local anesthesia. Four separate 16 gauge core biopsy were then acquired of the left axillary mass/lymph node using ultrasound guidance. Images were stored. Final image was stored after biopsy. Patient tolerated the procedure well and remained hemodynamically stable throughout. No complications were encountered and no significant blood loss was encounter IMPRESSION: Status post ultrasound-guided left axillary mass biopsy. Signed, Dulcy Fanny. Dellia Nims, RPVI [facility] Radiology Electronically Signed   By: Corrie Mckusick D.O.   On: 02/29/2020 16:40   UE VENOUS DUPLEX ([facility] & [facility] 7 am - 7 pm)  Result Date: 02/27/2020 UPPER VENOUS STUDY  Indications: Swelling of LT forearm/wrist/hand. Limitations: Depth and size of vessels. Comparison Study: No prior studies available. Performing Technologist: Darlin Coco RDMS  Examination Guidelines: A complete evaluation includes B-mode imaging, spectral Doppler, color Doppler, and power Doppler as needed of all accessible portions of each vessel. Bilateral testing is considered an integral part of a complete examination.  Limited examinations for reoccurring indications may be performed as noted.  Right Findings: +----------+------------+---------+-----------+----------+-------+ RIGHT     CompressiblePhasicitySpontaneousPropertiesSummary +----------+------------+---------+-----------+----------+-------+ Subclavian  Yes       Yes                      +----------+------------+---------+-----------+----------+-------+  Left Findings: +----------+------------+---------+-----------+----------+-------+ LEFT      CompressiblePhasicitySpontaneousPropertiesSummary +----------+------------+---------+-----------+----------+-------+ IJV           Full       Yes       Yes                      +----------+------------+---------+-----------+----------+-------+ Subclavian    Full       Yes       Yes                      +----------+------------+---------+-----------+----------+-------+ Axillary      Full       Yes       Yes                      +----------+------------+---------+-----------+----------+-------+ Brachial      Full       Yes       Yes                      +----------+------------+---------+-----------+----------+-------+ Radial        Full                                          +----------+------------+---------+-----------+----------+-------+ Ulnar         Full                                          +----------+------------+---------+-----------+----------+-------+ Cephalic      Full                                          +----------+------------+---------+-----------+----------+-------+ Basilic       Full                                          +----------+------------+---------+-----------+----------+-------+  Summary:  Right: No evidence of thrombosis in the subclavian.  Left: No evidence of deep vein thrombosis in the upper extremity. No evidence of superficial vein thrombosis in the upper extremity.  Indicental: Appearance of  enlarged, hypoechoic lymph node with loss of hilar definition in the axilla.  *See table(s) above for measurements and observations.  Diagnosing physician: Harold Barban MD Electronically signed by Harold Barban MD on 02/27/2020 at 9:42:15 PM.    Final     wk ago         All questions were answered. The patient knows to call the clinic with any problems, questions or concerns.     [name], MD 03/19/2020 4:26 PM

## 2020-03-19 NOTE — Telephone Encounter (Signed)
Rescheduled follow-up appointment per 2/16 los. Patient is aware.

## 2020-03-20 ENCOUNTER — Other Ambulatory Visit: Payer: Self-pay | Admitting: Hematology and Oncology

## 2020-03-20 DIAGNOSIS — J910 Malignant pleural effusion: Secondary | ICD-10-CM | POA: Diagnosis not present

## 2020-03-20 DIAGNOSIS — Z79899 Other long term (current) drug therapy: Secondary | ICD-10-CM | POA: Diagnosis not present

## 2020-03-20 DIAGNOSIS — I058 Other rheumatic mitral valve diseases: Secondary | ICD-10-CM | POA: Diagnosis not present

## 2020-03-20 DIAGNOSIS — C787 Secondary malignant neoplasm of liver and intrahepatic bile duct: Secondary | ICD-10-CM | POA: Diagnosis not present

## 2020-03-20 DIAGNOSIS — I071 Rheumatic tricuspid insufficiency: Secondary | ICD-10-CM | POA: Diagnosis not present

## 2020-03-20 DIAGNOSIS — C50912 Malignant neoplasm of unspecified site of left female breast: Secondary | ICD-10-CM | POA: Diagnosis not present

## 2020-03-20 DIAGNOSIS — Z9181 History of falling: Secondary | ICD-10-CM | POA: Diagnosis not present

## 2020-03-20 DIAGNOSIS — J42 Unspecified chronic bronchitis: Secondary | ICD-10-CM | POA: Diagnosis not present

## 2020-03-20 DIAGNOSIS — M5431 Sciatica, right side: Secondary | ICD-10-CM | POA: Diagnosis not present

## 2020-03-21 ENCOUNTER — Other Ambulatory Visit: Payer: Medicare HMO | Admitting: Student

## 2020-03-21 ENCOUNTER — Telehealth: Payer: Self-pay | Admitting: Hematology and Oncology

## 2020-03-21 ENCOUNTER — Other Ambulatory Visit: Payer: Self-pay

## 2020-03-21 DIAGNOSIS — Z515 Encounter for palliative care: Secondary | ICD-10-CM

## 2020-03-21 NOTE — Telephone Encounter (Signed)
Scheduled appointments per 2/16 los. Spoke to patient who is aware of appointments dates and times.

## 2020-03-21 NOTE — Progress Notes (Signed)
Morgan Yu Consult Note Telephone: [phone]  Fax: [phone]  PATIENT NAME: Morgan Yu [id number] [phone] (home)  DOB: 10/11/1940 MRN: [id number]  PRIMARY CARE PROVIDER:    Patient, No Pcp Per,  No address on file None  REFERRING PROVIDER:   [name], MD [place],  [place] [address] [phone]  RESPONSIBLE PARTY:   Extended Emergency Contact Information Primary Emergency Contact: Davis,Gayle Mobile Phone: [phone] Relation: Relative  I met face to face with patient and family in home/facility. Patient present but unable to substantively engage in process.   ASSESSMENT AND RECOMMENDATIONS:   Advance Care Planning: Visit at the request of Dr. Chryl Heck for palliative consult. Visit consisted of building trust and discussions on Palliative care medicine as specialized medical care for people living with serious illness, aimed at facilitating improved quality of life through symptoms relief, assisting with advance care planning and establishing goals of care. Education provided on differences between Palliative Medicine and Hospice services. Palliative care will continue to provide support to patient, family and the medical team.  Goal of care: Continue current treatment for breast CA, continue therapy, maintain current level of care and to be able to remain in her home.   Directives: MOST form reviewed; no changes. No CPR, comfort measures, determine use or limited antibiotics, no IV fluids, no feeding tube.   Symptom Management:   Nausea- reports occasional nausea; has taken zofran in the past with relief. Recommend ondansetron 33m PO every 8 hours PRN.  Constipation- Recommend Senna-s 8.6-50mg one tablet BID, encourage fiber intake such as prune juice, raisin bran.   Edema-patient with bilateral LE edema, left upper extremity edema. She is encouraged to  elevate her feet and legs while resting. She is also encouraged to elevate her left arm on pillow. She is to follow up with Oncology regarding if she will receive radiation for left axilla mass to possibly help reduce edema to left arm. Working with OT, recommend light compression/sleeve for left arm/hand.   Follow up Palliative Care Visit: Palliative care will continue to follow for complex decision making and symptom management. Return in 4 weeks or prn.  Family /Caregiver/Community Supports: Currently [facility] will continue to provide support to patient and family.  Cognitive / Functional decline: Patient requires assist with adl's. She uses walker for ambulation. Family to continue to provide support with adl/care needs.   I spent 45  minutes providing this consultation, from 12:30 to 1:15pm. Time includes time spent with patient/family, chart review, provider coordination, and documentation. More than 50% of the time in this consultation was spent counseling and coordinating communication.   CHIEF COMPLAINT: Palliative Medicine initial consult, breast cancer.  History obtained from review of EMR, discussion with primary team, and  interview with family, caregiver. Records reviewed and summarized below.  HISTORY OF PRESENT ILLNESS:  Morgan CARNEROis a 80y.o. year old female with multiple medical problems including Metastatic breast cancer with pulmonary nodules, liver masses and bone lesions. Palliative Care was asked to follow this patient by consultation request of [place], PArletha Pili MD to help address advance care planning and goals of care. This is an initial visit.  Patient started Arimidex one week ago. She endorses feeling more weak and tired some days. [facility] She reports needing help with dressing and showering; has a shower  chair. She reports a fair appetite; has had some  weight gain since starting on mirtazapine. She reports left hand swelling and to BLE. She denies pain. She does endorse constipation; takes Miralax prn, but does not feel she drinks adequate fluids. She also reports occasional nausea; has taken ondansetron in past with relief. No recent falls or injury. Upcoming appointment on 04/01/20 with Oncology.     CODE STATUS: DNR  PPS: 50%  HOSPICE ELIGIBILITY/DIAGNOSIS: TBD  PHYSICAL EXAM / ROS:  140/82, pulse 104, respirations 20 sats 94% on room air.  Current and past weights: 93 pounds.  General: NAD, frail appearing, thin Cardiovascular: RRR, no chest pain or palpitations reported  Pulmonary: LCTA, no cough, no increased WOB, room air GI:  appetite fair, endorses constipation, continent of bowel GU: denies dysuria, continent of urine MSK: ambulatory with walker Skin: no rashes or wounds reported Neurological: Weakness, but otherwise nonfocal Psych: non anxious affect  PAST MEDICAL HISTORY: No past medical history on file.  SOCIAL HX:  Social History   Tobacco Use  . Smoking status: Never Smoker  . Smokeless tobacco: Never Used  Substance Use Topics  . Alcohol use: Not Currently   FAMILY HX:  Family History  Problem Relation Age of Onset  . Stroke Mother   . Heart attack Father   . Cancer Brother   . Esophageal cancer Maternal Aunt   . Breast cancer Maternal Aunt     ALLERGIES:  Allergies  Allergen Reactions  . Vicodin Hp [Hydrocodone-Acetaminophen]     Brain fog     PERTINENT MEDICATIONS:  Outpatient Encounter Medications as of 03/21/2020  Medication Sig  . acetaminophen (TYLENOL) 500 MG tablet Take 500 mg by mouth every 6 (six) hours as needed for moderate pain.  Marland Kitchen anastrozole (ARIMIDEX) 1 MG tablet Take 1 tablet (1 mg total) by mouth daily.  . feeding supplement (ENSURE ENLIVE / ENSURE PLUS) LIQD Take 237 mLs by mouth 3 (three) times daily between meals. (Patient not taking: Reported on 03/19/2020)  . mirtazapine  (REMERON) 15 MG tablet Take 1 tablet (15 mg total) by mouth at bedtime.  . Probiotic Product (PROBIOTIC PO) Take 1 capsule by mouth daily.   No facility-administered encounter medications on file as of 03/21/2020.     Thank you for the opportunity to participate in the care of Morgan Yu. The palliative care team will continue to follow. Please call our office at [phone] if we can be of additional assistance.  Ezekiel Slocumb, NP, AGPCNP-C

## 2020-03-24 ENCOUNTER — Other Ambulatory Visit: Payer: Self-pay | Admitting: Oncology

## 2020-03-24 ENCOUNTER — Inpatient Hospital Stay: Payer: Medicare HMO

## 2020-03-24 ENCOUNTER — Telehealth: Payer: Self-pay | Admitting: Student

## 2020-03-24 ENCOUNTER — Other Ambulatory Visit: Payer: Self-pay

## 2020-03-24 ENCOUNTER — Telehealth: Payer: Self-pay

## 2020-03-24 VITALS — BP 139/61 | HR 100 | Temp 98.2°F | Resp 18

## 2020-03-24 DIAGNOSIS — C787 Secondary malignant neoplasm of liver and intrahepatic bile duct: Secondary | ICD-10-CM | POA: Diagnosis not present

## 2020-03-24 DIAGNOSIS — C7951 Secondary malignant neoplasm of bone: Secondary | ICD-10-CM

## 2020-03-24 DIAGNOSIS — R531 Weakness: Secondary | ICD-10-CM | POA: Diagnosis not present

## 2020-03-24 DIAGNOSIS — Z170 Estrogen receptor positive status [ER+]: Secondary | ICD-10-CM | POA: Diagnosis not present

## 2020-03-24 DIAGNOSIS — C50912 Malignant neoplasm of unspecified site of left female breast: Secondary | ICD-10-CM | POA: Diagnosis not present

## 2020-03-24 DIAGNOSIS — C50919 Malignant neoplasm of unspecified site of unspecified female breast: Secondary | ICD-10-CM

## 2020-03-24 DIAGNOSIS — R7401 Elevation of levels of liver transaminase levels: Secondary | ICD-10-CM | POA: Diagnosis not present

## 2020-03-24 DIAGNOSIS — R918 Other nonspecific abnormal finding of lung field: Secondary | ICD-10-CM | POA: Diagnosis not present

## 2020-03-24 DIAGNOSIS — C773 Secondary and unspecified malignant neoplasm of axilla and upper limb lymph nodes: Secondary | ICD-10-CM | POA: Diagnosis not present

## 2020-03-24 DIAGNOSIS — R609 Edema, unspecified: Secondary | ICD-10-CM | POA: Diagnosis not present

## 2020-03-24 LAB — CBC WITH DIFFERENTIAL/PLATELET
Abs Immature Granulocytes: 0.13 10*3/uL — ABNORMAL HIGH (ref 0.00–0.07)
Basophils Absolute: 0.1 10*3/uL (ref 0.0–0.1)
Basophils Relative: 1 %
Eosinophils Absolute: 0 10*3/uL (ref 0.0–0.5)
Eosinophils Relative: 0 %
HCT: 39.3 % (ref 36.0–46.0)
Hemoglobin: 12.9 g/dL (ref 12.0–15.0)
Immature Granulocytes: 1 %
Lymphocytes Relative: 22 %
Lymphs Abs: 2 10*3/uL (ref 0.7–4.0)
MCH: 27.8 pg (ref 26.0–34.0)
MCHC: 32.8 g/dL (ref 30.0–36.0)
MCV: 84.7 fL (ref 80.0–100.0)
Monocytes Absolute: 1 10*3/uL (ref 0.1–1.0)
Monocytes Relative: 11 %
Neutro Abs: 6 10*3/uL (ref 1.7–7.7)
Neutrophils Relative %: 65 %
Platelets: 328 10*3/uL (ref 150–400)
RBC: 4.64 MIL/uL (ref 3.87–5.11)
RDW: 22.9 % — ABNORMAL HIGH (ref 11.5–15.5)
WBC: 9.2 10*3/uL (ref 4.0–10.5)
nRBC: 0.3 % — ABNORMAL HIGH (ref 0.0–0.2)

## 2020-03-24 LAB — CMP (CANCER CENTER ONLY)
ALT: 28 U/L (ref 0–44)
AST: 184 U/L (ref 15–41)
Albumin: 2 g/dL — ABNORMAL LOW (ref 3.5–5.0)
Alkaline Phosphatase: 1262 U/L — ABNORMAL HIGH (ref 38–126)
Anion gap: 13 (ref 5–15)
BUN: 24 mg/dL — ABNORMAL HIGH (ref 8–23)
CO2: 20 mmol/L — ABNORMAL LOW (ref 22–32)
Calcium: 8.5 mg/dL — ABNORMAL LOW (ref 8.9–10.3)
Chloride: 103 mmol/L (ref 98–111)
Creatinine: 0.71 mg/dL (ref 0.44–1.00)
GFR, Estimated: >60 mL/min (ref >60)
Glucose, Bld: 106 mg/dL — ABNORMAL HIGH (ref 70–99)
Potassium: 3.6 mmol/L (ref 3.5–5.1)
Sodium: 136 mmol/L (ref 135–145)
Total Bilirubin: 1.2 mg/dL (ref 0.3–1.2)
Total Protein: 6.3 g/dL — ABNORMAL LOW (ref 6.5–8.1)

## 2020-03-24 MED ORDER — TRASTUZUMAB-DKST CHEMO 150 MG IV SOLR: 8.0000 mg/kg | Freq: Once | INTRAVENOUS | Status: DC

## 2020-03-24 MED ORDER — ACETAMINOPHEN 325 MG PO TABS
ORAL_TABLET | ORAL | Status: AC
  Filled 2020-03-24: qty 2

## 2020-03-24 MED ORDER — ZOLEDRONIC ACID 4 MG/5ML IV CONC
3.0000 mg | Freq: Once | INTRAVENOUS | Status: AC
  Administered 2020-03-24: 3 mg via INTRAVENOUS
  Filled 2020-03-24: qty 3.75

## 2020-03-24 MED ORDER — ACETAMINOPHEN 325 MG PO TABS
650.0000 mg | ORAL_TABLET | Freq: Once | ORAL | Status: AC
  Administered 2020-03-24: 650 mg via ORAL

## 2020-03-24 MED ORDER — TRASTUZUMAB-ANNS CHEMO 150 MG IV SOLR
8.0000 mg/kg | Freq: Once | INTRAVENOUS | Status: AC
  Administered 2020-03-24: 336 mg via INTRAVENOUS
  Filled 2020-03-24: qty 16

## 2020-03-24 MED ORDER — DIPHENHYDRAMINE HCL 25 MG PO CAPS
ORAL_CAPSULE | ORAL | Status: AC
  Filled 2020-03-24: qty 2

## 2020-03-24 MED ORDER — DIPHENHYDRAMINE HCL 25 MG PO CAPS
50.0000 mg | ORAL_CAPSULE | Freq: Once | ORAL | Status: AC
  Administered 2020-03-24: 50 mg via ORAL

## 2020-03-24 MED ORDER — SODIUM CHLORIDE 0.9 % IV SOLN
Freq: Once | INTRAVENOUS | Status: AC
  Filled 2020-03-24: qty 250

## 2020-03-24 MED ORDER — ZOLEDRONIC ACID 4 MG/100ML IV SOLN
INTRAVENOUS | Status: AC
  Filled 2020-03-24: qty 100

## 2020-03-24 MED ORDER — ONDANSETRON HCL 4 MG PO TABS: 4.0000 mg | ORAL_TABLET | Freq: Three times a day (TID) | ORAL | 1 refills | Status: DC | PRN

## 2020-03-24 NOTE — Telephone Encounter (Signed)
NP left message for Dr. Rob Hickman office regarding Palliative recommendation for ondansetron prn for nausea.

## 2020-03-24 NOTE — Telephone Encounter (Signed)
CRITICAL VALUE STICKER  CRITICAL VALUE: AST = [phone]  RECEIVER (on-site recipient of call): Yetta Glassman, CMA  DATE & TIME NOTIFIED: 03/24/20 at 9:16am  MESSENGER (representative from lab): Rosann Auerbach  MD NOTIFIED: Dr. Chryl Heck  TIME OF NOTIFICATION: 03/24/20 at 9:20am  RESPONSE: Notification given to Joesphine Bare, RN for follow-up with provider,

## 2020-03-24 NOTE — Patient Instructions (Signed)
[facility] Discharge Instructions for Patients Receiving Chemotherapy  Today you received the following chemotherapy agents: Trastuzumab  To help prevent nausea and vomiting after your treatment, we encourage you to take your nausea medication as directed.   If you develop nausea and vomiting that is not controlled by your nausea medication, call the clinic.   BELOW ARE SYMPTOMS THAT SHOULD BE REPORTED IMMEDIATELY:  *FEVER GREATER THAN 100.5 F  *CHILLS WITH OR WITHOUT FEVER  NAUSEA AND VOMITING THAT IS NOT CONTROLLED WITH YOUR NAUSEA MEDICATION  *UNUSUAL SHORTNESS OF BREATH  *UNUSUAL BRUISING OR BLEEDING  TENDERNESS IN MOUTH AND THROAT WITH OR WITHOUT PRESENCE OF ULCERS  *URINARY PROBLEMS  *BOWEL PROBLEMS  UNUSUAL RASH Items with * indicate a potential emergency and should be followed up as soon as possible.  Feel free to call the clinic should you have any questions or concerns. The clinic phone number is [phone].  Please show the [facility] at check-in to the Emergency Department and triage nurse.  Trastuzumab injection for infusion What is this medicine? TRASTUZUMAB (tras TOO zoo mab) is a monoclonal antibody. It is used to treat breast cancer and stomach cancer. This medicine may be used for other purposes; ask your health care provider or pharmacist if you have questions. COMMON BRAND NAME(S): Herceptin, Galvin Proffer, Trazimera What should I tell my health care provider before I take this medicine? They need to know if you have any of these conditions:  heart disease  heart failure  lung or breathing disease, like asthma  an unusual or allergic reaction to trastuzumab, benzyl alcohol, or other medications, foods, dyes, or preservatives  pregnant or trying to get pregnant  breast-feeding How should I use this medicine? This drug is given as an infusion into a vein. It is administered in a hospital or clinic  by a specially trained health care professional. Talk to your pediatrician regarding the use of this medicine in children. This medicine is not approved for use in children. Overdosage: If you think you have taken too much of this medicine contact a poison control center or emergency room at once. NOTE: This medicine is only for you. Do not share this medicine with others. What if I miss a dose? It is important not to miss a dose. Call your doctor or health care professional if you are unable to keep an appointment. What may interact with this medicine? This medicine may interact with the following medications:  certain types of chemotherapy, such as daunorubicin, doxorubicin, epirubicin, and idarubicin This list may not describe all possible interactions. Give your health care provider a list of all the medicines, herbs, non-prescription drugs, or dietary supplements you use. Also tell them if you smoke, drink alcohol, or use illegal drugs. Some items may interact with your medicine. What should I watch for while using this medicine? Visit your doctor for checks on your progress. Report any side effects. Continue your course of treatment even though you feel ill unless your doctor tells you to stop. Call your doctor or health care professional for advice if you get a fever, chills or sore throat, or other symptoms of a cold or flu. Do not treat yourself. Try to avoid being around people who are sick. You may experience fever, chills and shaking during your first infusion. These effects are usually mild and can be treated with other medicines. Report any side effects during the infusion to your health care professional. Fever and chills  usually do not happen with later infusions. Do not become pregnant while taking this medicine or for 7 months after stopping it. Women should inform their doctor if they wish to become pregnant or think they might be pregnant. Women of child-bearing potential will need  to have a negative pregnancy test before starting this medicine. There is a potential for serious side effects to an unborn child. Talk to your health care professional or pharmacist for more information. Do not breast-feed an infant while taking this medicine or for 7 months after stopping it. Women must use effective birth control with this medicine. What side effects may I notice from receiving this medicine? Side effects that you should report to your doctor or health care professional as soon as possible:  allergic reactions like skin rash, itching or hives, swelling of the face, lips, or tongue  chest pain or palpitations  cough  dizziness  feeling faint or lightheaded, falls  fever  general ill feeling or flu-like symptoms  signs of worsening heart failure like breathing problems; swelling in your legs and feet  unusually weak or tired Side effects that usually do not require medical attention (report to your doctor or health care professional if they continue or are bothersome):  bone pain  changes in taste  diarrhea  joint pain  nausea/vomiting  weight loss This list may not describe all possible side effects. Call your doctor for medical advice about side effects. You may report side effects to FDA at 1-800-FDA-1088. Where should I keep my medicine? This drug is given in a hospital or clinic and will not be stored at home. NOTE: This sheet is a summary. It may not cover all possible information. If you have questions about this medicine, talk to your doctor, pharmacist, or health care provider.  2021 Elsevier/Gold Standard (2016-01-13 14:37:52)  Zoledronic Acid Injection (Hypercalcemia, Oncology) What is this medicine? ZOLEDRONIC ACID (ZOE le dron ik AS id) slows calcium loss from bones. It high calcium levels in the blood from some kinds of cancer. It may be used in other people at risk for bone loss. This medicine may be used for other purposes; ask your  health care provider or pharmacist if you have questions. COMMON BRAND NAME(S): Zometa What should I tell my health care provider before I take this medicine? They need to know if you have any of these conditions:  cancer  dehydration  dental disease  kidney disease  liver disease  low levels of calcium in the blood  lung or breathing disease (asthma)  receiving steroids like dexamethasone or prednisone  an unusual or allergic reaction to zoledronic acid, other medicines, foods, dyes, or preservatives  pregnant or trying to get pregnant  breast-feeding How should I use this medicine? This drug is injected into a vein. It is given by a health care provider in a hospital or clinic setting. Talk to your health care provider about the use of this drug in children. Special care may be needed. Overdosage: If you think you have taken too much of this medicine contact a poison control center or emergency room at once. NOTE: This medicine is only for you. Do not share this medicine with others. What if I miss a dose? Keep appointments for follow-up doses. It is important not to miss your dose. Call your health care provider if you are unable to keep an appointment. What may interact with this medicine?  certain antibiotics given by injection  NSAIDs, medicines for pain and  inflammation, like ibuprofen or naproxen  some diuretics like bumetanide, furosemide  teriparatide  thalidomide This list may not describe all possible interactions. Give your health care provider a list of all the medicines, herbs, non-prescription drugs, or dietary supplements you use. Also tell them if you smoke, drink alcohol, or use illegal drugs. Some items may interact with your medicine. What should I watch for while using this medicine? Visit your health care provider for regular checks on your progress. It may be some time before you see the benefit from this drug. Some people who take this drug have  severe bone, joint, or muscle pain. This drug may also increase your risk for jaw problems or a broken thigh bone. Tell your health care provider right away if you have severe pain in your jaw, bones, joints, or muscles. Tell you health care provider if you have any pain that does not go away or that gets worse. Tell your dentist and dental surgeon that you are taking this drug. You should not have major dental surgery while on this drug. See your dentist to have a dental exam and fix any dental problems before starting this drug. Take good care of your teeth while on this drug. Make sure you see your dentist for regular follow-up appointments. You should make sure you get enough calcium and vitamin D while you are taking this drug. Discuss the foods you eat and the vitamins you take with your health care provider. Check with your health care provider if you have severe diarrhea, nausea, and vomiting, or if you sweat a lot. The loss of too much body fluid may make it dangerous for you to take this drug. You may need blood work done while you are taking this drug. Do not become pregnant while taking this drug. Women should inform their health care provider if they wish to become pregnant or think they might be pregnant. There is potential for serious harm to an unborn child. Talk to your health care provider for more information. What side effects may I notice from receiving this medicine? Side effects that you should report to your doctor or health care provider as soon as possible:  allergic reactions (skin rash, itching or hives; swelling of the face, lips, or tongue)  bone pain  infection (fever, chills, cough, sore throat, pain or trouble passing urine)  jaw pain, especially after dental work  joint pain  kidney injury (trouble passing urine or change in the amount of urine)  low blood pressure (dizziness; feeling faint or lightheaded, falls; unusually weak or tired)  low calcium levels  (fast heartbeat; muscle cramps or pain; pain, tingling, or numbness in the hands or feet; seizures)  low magnesium levels (fast, irregular heartbeat; muscle cramp or pain; muscle weakness; tremors; seizures)  low red blood cell counts (trouble breathing; feeling faint; lightheaded, falls; unusually weak or tired)  muscle pain  redness, blistering, peeling, or loosening of the skin, including inside the mouth  severe diarrhea  swelling of the ankles, feet, hands  trouble breathing Side effects that usually do not require medical attention (report to your doctor or health care provider if they continue or are bothersome):  anxious  constipation  coughing  depressed mood  eye irritation, itching, or pain  fever  general ill feeling or flu-like symptoms  nausea  pain, redness, or irritation at site where injected  trouble sleeping This list may not describe all possible side effects. Call your doctor for medical advice about  side effects. You may report side effects to FDA at 1-800-FDA-1088. Where should I keep my medicine? This drug is given in a hospital or clinic. It will not be stored at home. NOTE: This sheet is a summary. It may not cover all possible information. If you have questions about this medicine, talk to your doctor, pharmacist, or health care provider.  2021 Elsevier/Gold Standard (2018-11-02 09:13:00)

## 2020-03-24 NOTE — Progress Notes (Signed)
Received voicemail from [facility], NP with [facility] regarding patient.  [name] recommended patient have a prescription of ondansetron PO 4 mg Q 4 hours PRN but wanted our office to fill the prescription for patient.  Dr. Chryl Heck is off today. Therefore verbal order received from Morgan Mealy, PA. Called and informed patient of new prescription. Patient verbalized understanding.

## 2020-03-25 DIAGNOSIS — M5431 Sciatica, right side: Secondary | ICD-10-CM | POA: Diagnosis not present

## 2020-03-25 DIAGNOSIS — Z79899 Other long term (current) drug therapy: Secondary | ICD-10-CM | POA: Diagnosis not present

## 2020-03-25 DIAGNOSIS — J42 Unspecified chronic bronchitis: Secondary | ICD-10-CM | POA: Diagnosis not present

## 2020-03-25 DIAGNOSIS — Z9181 History of falling: Secondary | ICD-10-CM | POA: Diagnosis not present

## 2020-03-25 DIAGNOSIS — I071 Rheumatic tricuspid insufficiency: Secondary | ICD-10-CM | POA: Diagnosis not present

## 2020-03-25 DIAGNOSIS — J910 Malignant pleural effusion: Secondary | ICD-10-CM | POA: Diagnosis not present

## 2020-03-25 DIAGNOSIS — C787 Secondary malignant neoplasm of liver and intrahepatic bile duct: Secondary | ICD-10-CM | POA: Diagnosis not present

## 2020-03-25 DIAGNOSIS — C50912 Malignant neoplasm of unspecified site of left female breast: Secondary | ICD-10-CM | POA: Diagnosis not present

## 2020-03-25 DIAGNOSIS — I058 Other rheumatic mitral valve diseases: Secondary | ICD-10-CM | POA: Diagnosis not present

## 2020-03-26 ENCOUNTER — Other Ambulatory Visit: Payer: Self-pay | Admitting: Hematology and Oncology

## 2020-03-31 DIAGNOSIS — M5431 Sciatica, right side: Secondary | ICD-10-CM | POA: Diagnosis not present

## 2020-03-31 DIAGNOSIS — C50912 Malignant neoplasm of unspecified site of left female breast: Secondary | ICD-10-CM | POA: Diagnosis not present

## 2020-03-31 DIAGNOSIS — I071 Rheumatic tricuspid insufficiency: Secondary | ICD-10-CM | POA: Diagnosis not present

## 2020-03-31 DIAGNOSIS — Z9181 History of falling: Secondary | ICD-10-CM | POA: Diagnosis not present

## 2020-03-31 DIAGNOSIS — I058 Other rheumatic mitral valve diseases: Secondary | ICD-10-CM | POA: Diagnosis not present

## 2020-03-31 DIAGNOSIS — Z79899 Other long term (current) drug therapy: Secondary | ICD-10-CM | POA: Diagnosis not present

## 2020-03-31 DIAGNOSIS — C787 Secondary malignant neoplasm of liver and intrahepatic bile duct: Secondary | ICD-10-CM | POA: Diagnosis not present

## 2020-03-31 DIAGNOSIS — J910 Malignant pleural effusion: Secondary | ICD-10-CM | POA: Diagnosis not present

## 2020-03-31 DIAGNOSIS — J42 Unspecified chronic bronchitis: Secondary | ICD-10-CM | POA: Diagnosis not present

## 2020-04-01 ENCOUNTER — Other Ambulatory Visit: Payer: Medicare HMO

## 2020-04-01 ENCOUNTER — Ambulatory Visit: Payer: Medicare HMO

## 2020-04-01 ENCOUNTER — Inpatient Hospital Stay: Payer: Medicare HMO | Admitting: Hematology and Oncology

## 2020-04-01 DIAGNOSIS — J910 Malignant pleural effusion: Secondary | ICD-10-CM | POA: Diagnosis not present

## 2020-04-01 DIAGNOSIS — Z79899 Other long term (current) drug therapy: Secondary | ICD-10-CM | POA: Diagnosis not present

## 2020-04-01 DIAGNOSIS — I058 Other rheumatic mitral valve diseases: Secondary | ICD-10-CM | POA: Diagnosis not present

## 2020-04-01 DIAGNOSIS — Z9181 History of falling: Secondary | ICD-10-CM | POA: Diagnosis not present

## 2020-04-01 DIAGNOSIS — C787 Secondary malignant neoplasm of liver and intrahepatic bile duct: Secondary | ICD-10-CM | POA: Diagnosis not present

## 2020-04-01 DIAGNOSIS — C50912 Malignant neoplasm of unspecified site of left female breast: Secondary | ICD-10-CM | POA: Diagnosis not present

## 2020-04-01 DIAGNOSIS — I071 Rheumatic tricuspid insufficiency: Secondary | ICD-10-CM | POA: Diagnosis not present

## 2020-04-01 DIAGNOSIS — M5431 Sciatica, right side: Secondary | ICD-10-CM | POA: Diagnosis not present

## 2020-04-01 DIAGNOSIS — J42 Unspecified chronic bronchitis: Secondary | ICD-10-CM | POA: Diagnosis not present

## 2020-04-08 ENCOUNTER — Inpatient Hospital Stay: Payer: Medicare HMO | Attending: Hematology and Oncology | Admitting: Hematology and Oncology

## 2020-04-08 ENCOUNTER — Other Ambulatory Visit: Payer: Self-pay

## 2020-04-08 ENCOUNTER — Telehealth: Payer: Self-pay | Admitting: Hematology and Oncology

## 2020-04-08 VITALS — BP 154/68 | HR 95 | Temp 96.7°F | Resp 17 | Ht 60.0 in | Wt 103.0 lb

## 2020-04-08 DIAGNOSIS — R590 Localized enlarged lymph nodes: Secondary | ICD-10-CM | POA: Insufficient documentation

## 2020-04-08 DIAGNOSIS — Z79899 Other long term (current) drug therapy: Secondary | ICD-10-CM | POA: Diagnosis not present

## 2020-04-08 DIAGNOSIS — Z79811 Long term (current) use of aromatase inhibitors: Secondary | ICD-10-CM | POA: Diagnosis not present

## 2020-04-08 DIAGNOSIS — Z170 Estrogen receptor positive status [ER+]: Secondary | ICD-10-CM | POA: Diagnosis not present

## 2020-04-08 DIAGNOSIS — C50919 Malignant neoplasm of unspecified site of unspecified female breast: Secondary | ICD-10-CM

## 2020-04-08 DIAGNOSIS — R609 Edema, unspecified: Secondary | ICD-10-CM | POA: Insufficient documentation

## 2020-04-08 DIAGNOSIS — C7800 Secondary malignant neoplasm of unspecified lung: Secondary | ICD-10-CM | POA: Insufficient documentation

## 2020-04-08 DIAGNOSIS — Z800 Family history of malignant neoplasm of digestive organs: Secondary | ICD-10-CM | POA: Insufficient documentation

## 2020-04-08 DIAGNOSIS — C50912 Malignant neoplasm of unspecified site of left female breast: Secondary | ICD-10-CM | POA: Insufficient documentation

## 2020-04-08 DIAGNOSIS — Z803 Family history of malignant neoplasm of breast: Secondary | ICD-10-CM | POA: Diagnosis not present

## 2020-04-08 DIAGNOSIS — I890 Lymphedema, not elsewhere classified: Secondary | ICD-10-CM | POA: Diagnosis not present

## 2020-04-08 DIAGNOSIS — R600 Localized edema: Secondary | ICD-10-CM | POA: Diagnosis not present

## 2020-04-08 DIAGNOSIS — C787 Secondary malignant neoplasm of liver and intrahepatic bile duct: Secondary | ICD-10-CM | POA: Insufficient documentation

## 2020-04-08 DIAGNOSIS — C7951 Secondary malignant neoplasm of bone: Secondary | ICD-10-CM | POA: Diagnosis not present

## 2020-04-08 DIAGNOSIS — Z7983 Long term (current) use of bisphosphonates: Secondary | ICD-10-CM

## 2020-04-08 DIAGNOSIS — Z5181 Encounter for therapeutic drug level monitoring: Secondary | ICD-10-CM | POA: Diagnosis not present

## 2020-04-08 MED ORDER — FUROSEMIDE 20 MG PO TABS: 20.0000 mg | ORAL_TABLET | Freq: Every day | ORAL | 0 refills | Status: DC

## 2020-04-08 NOTE — Progress Notes (Signed)
[name] FOLLOW UP NOTE  Patient Care Team: Patient, No Pcp Per as PCP - General (General Practice) Morgan Pike, MD as Consulting Physician (Hematology and Oncology)  CHIEF COMPLAINTS/PURPOSE OF CONSULTATION:  New diagnosis of metastatic breast cancer.  ASSESSMENT & PLAN:  No problem-specific Assessment & Plan notes found for this encounter.  No orders of the defined types were placed in this encounter.  Metastatic breast cancer with pulmonary nodules, liver masses and bone lesions.  1. Discussed biopsy results of the chest wall/left axillary mass which were consistent with metastatic carcinoma, ER positive, PR negative, Her 2 amplified. - Given her older age and co-morbidities, we started her on subcutaneous Herceptin, Arimidex and she is here for follow-up. -She started arimidex on 03/11/2020, tolerating it very well so far, no adverse effects reported.  Since she started  subcutaneous Herceptin, she has noticed that the lymph nodes in her neck have not been palpable.   - She continues to gain more energy, continues to engage in physical therapy and Occupational therapy. - We again discussed that her metastatic breast cancer is not curable but is treatable. -Since she is tolerating Herceptin very well, discussed that time we would like Pertuzumab after couple cycles if she is willing to try it. Physical examination, breast findings appear improved, no palpable lymphadenopathy in the neck, palpable axillary lymphadenopathy and severe left upper extremity swelling, mild bilateral lower extremity swelling.  Overall plan is to continue Arimidex, Herceptin, monitor, so far excellent clinical response.  2. Left arm lymphedema due to large lymphadenopathy in the left axilla I have again brought up the idea of palliative radiation to the axilla but she she would like to continue current treatment and think about this later.  3 BLE ankle edema I prescribed her Lasix 20 mg  once a day for bilateral lower extremity edema. Have asked her to elevate her legs and use some wraps to control the edema.  She will continue to monitor  4. Bone mets, will start her on zometa monthly and transition to every 3 months.  Return to clinic in 4 weeks before next subcutaneous Herceptin infusion  HISTORY OF PRESENTING ILLNESS:   Morgan Yu 80 y.o. female is here because of newly diagnosed metastatic breast cancer.  Oncology History  Carcinoma of breast metastatic to bone ([place])  03/19/2020 Initial Diagnosis   Carcinoma of breast metastatic to bone ([place])   03/24/2020 -  Chemotherapy    Patient is on Treatment Plan: BREAST TRASTUZUMAB Q21D        Ms. Baune is a 80 year old female with no significant past medical history presented to the emergency department with weakness.  She has a left breast mass and developed left arm swelling.  The patient thinks that the breast mass and swelling have been present for at least a few weeks to months.  She states that she initially developed pruritus and later a lump involving the left breast.  Left arm swelling has been more recent.  Last mammogram was many years ago.  CTA chest and CT of the abdomen pelvis were performed which showed no evidence of PE but did show moderate bilateral pleural effusions, nodules in the right middle lung and right lower lung likely metastatic, diffuse hepatic metastasis, diffuse skeletal metastasis.    The patient is widowed and lives alone.  She has no biological children but has 2 stepchildren.  She has friends and family who check on her.  Denies history of alcohol tobacco use.  Family history significant for a maternal aunt with esophageal cancer (smoker) and a paternal aunt with breast cancer in her 41s.  The patient reports menarche around age 68 and menopause at around age 45.  Used hormone replacement therapy for about 1 year around age 45.    Pathology confirmed metastatic carcinoma, breast  primary, ER positive PR negative and Her 2 amplified by IHC  INTERIM HISTORY  She is here for follow-up with her cousin/daughter. Since last visit, she continues to improve every day.  Appetite and energy have been better.  She is engaging in physical therapy and Occupational Therapy.  The only thing that has gotten worse in the past month is her lower extremity swelling which is still symmetrical.  No tenderness or cramping in the lower extremities.  No chest pain or shortness of breath.  She has noticed that her neck lymph nodes are not palpable anymore. Her breathing continues to get better.  Constipation has resolved.  No new bone pains.  No change in urinary habits.  No new neurological complaints.  Overall she has tolerated Herceptin very well.  She had some chills after the infusion which are likely from the bisphosphonates.  Rest of the pertinent 10 point ROS reviewed and negative.   MEDICAL HISTORY:  No past medical history on file.  SURGICAL HISTORY: No past surgical history on file.  SOCIAL HISTORY: Social History   Socioeconomic History   Marital status: Widowed    Spouse name: Not on file   Number of children: Not on file   Years of education: Not on file   Highest education level: Not on file  Occupational History   Not on file  Tobacco Use   Smoking status: Never Smoker   Smokeless tobacco: Never Used  Substance and Sexual Activity   Alcohol use: Not Currently   Drug use: Never   Sexual activity: Not on file  Other Topics Concern   Not on file  Social History Narrative   No biological children   Has 2 stepchildren   Social Determinants of Health   Financial Resource Strain: Not on file  Food Insecurity: Not on file  Transportation Needs: Not on file  Physical Activity: Not on file  Stress: Not on file  Social Connections: Not on file  Intimate Partner Violence: Not on file    FAMILY HISTORY: Family History  Problem Relation Age of Onset    Stroke Mother    Heart attack Father    Cancer Brother    Esophageal cancer Maternal Aunt    Breast cancer Maternal Aunt     ALLERGIES:  is allergic to vicodin hp [hydrocodone-acetaminophen].  MEDICATIONS:  Current Outpatient Medications  Medication Sig Dispense Refill   furosemide (LASIX) 20 MG tablet Take 1 tablet (20 mg total) by mouth daily. 30 tablet 0   acetaminophen (TYLENOL) 500 MG tablet Take 500 mg by mouth every 6 (six) hours as needed for moderate pain.     anastrozole (ARIMIDEX) 1 MG tablet Take 1 tablet (1 mg total) by mouth daily. 30 tablet 1   feeding supplement (ENSURE ENLIVE / ENSURE PLUS) LIQD Take 237 mLs by mouth 3 (three) times daily between meals. (Patient not taking: No sig reported) 237 mL 12   mirtazapine (REMERON) 15 MG tablet Take 1 tablet (15 mg total) by mouth at bedtime. 30 tablet 0   ondansetron (ZOFRAN) 4 MG tablet Take 1 tablet (4 mg total) by mouth every 8 (eight) hours as needed for nausea  or vomiting. 40 tablet 1   Probiotic Product (PROBIOTIC PO) Take 1 capsule by mouth daily.     No current facility-administered medications for this visit.    PHYSICAL EXAMINATION:  ECOG PERFORMANCE STATUS: 0 - Asymptomatic  Vitals:   04/08/20 1408  BP: (!) 154/68  Pulse: 95  Resp: 17  Temp: (!) 96.7 F (35.9 C)  SpO2: 95%   Filed Weights   04/08/20 1408  Weight: 103 lb (46.7 kg)    GENERAL: alert, no distress and comfortable, in a wheel chair SKIN: skin color, texture, turgor are normal, no rashes or significant lesions Lymphatics:  On today's exam, I could not feel any of her cervical lymphadenopathy, axillary lymph nodes are still palpable BREAST: Left breast with erythema, skin involvement noted, palpable mass in the left breast occupying pretty much the entire breast,  this has significantly improved compared to the last visit. Respiratory: lungs were clear bilaterally without wheezing or crackles.   Cardiovascular:  Regular rate  and rhythm, S1/S2, without murmur, rub or gallop.  There was no pedal edema.   GI:  abdomen was soft, flat, nontender, nondistended, without organomegaly.   Musculoskeletal:  Bilateral symmetrical 1-2+ lower extremity swelling.  Severe left upper extremity swelling from axillary lymphadenopathy. Skin exam was without echymosis, petichae.   Neuro exam was nonfocal. Patient was alert and oriented.  Attention was good.   Language was appropriate.  Mood was normal without depression.  Speech was not pressured.  Thought content was not tangential.    LABORATORY DATA:  I have reviewed the data as listed Lab Results  Component Value Date   WBC 9.2 03/24/2020   HGB 12.9 03/24/2020   HCT 39.3 03/24/2020   MCV 84.7 03/24/2020   PLT 328 03/24/2020     Chemistry      Component Value Date/Time   NA 136 03/24/2020 0824   K 3.6 03/24/2020 0824   CL 103 03/24/2020 0824   CO2 20 (L) 03/24/2020 0824   BUN 24 (H) 03/24/2020 0824   CREATININE 0.71 03/24/2020 0824      Component Value Date/Time   CALCIUM 8.5 (L) 03/24/2020 0824   ALKPHOS 1,262 (H) 03/24/2020 0824   AST 184 (HH) 03/24/2020 0824   ALT 28 03/24/2020 0824   BILITOT 1.2 03/24/2020 0824       RADIOGRAPHIC STUDIES: I have personally reviewed the radiological images as listed and agreed with the findings in the report. No results found.  wk ago         All questions were answered. The patient knows to call the clinic with any problems, questions or concerns. I spent 30 minutes in the care of this patient including history, physical, review of records and counseling and coordination of care    Morgan Pike, MD 04/08/2020 2:51 PM

## 2020-04-08 NOTE — Telephone Encounter (Signed)
Scheduled per los. Gave avs and calendar. Advised patient that I will call about requested 3/14 appt once approved for infusion

## 2020-04-09 ENCOUNTER — Encounter: Payer: Self-pay | Admitting: Hematology and Oncology

## 2020-04-09 DIAGNOSIS — J42 Unspecified chronic bronchitis: Secondary | ICD-10-CM | POA: Diagnosis not present

## 2020-04-09 DIAGNOSIS — Z79899 Other long term (current) drug therapy: Secondary | ICD-10-CM | POA: Diagnosis not present

## 2020-04-09 DIAGNOSIS — C50912 Malignant neoplasm of unspecified site of left female breast: Secondary | ICD-10-CM | POA: Diagnosis not present

## 2020-04-09 DIAGNOSIS — I071 Rheumatic tricuspid insufficiency: Secondary | ICD-10-CM | POA: Diagnosis not present

## 2020-04-09 DIAGNOSIS — J910 Malignant pleural effusion: Secondary | ICD-10-CM | POA: Diagnosis not present

## 2020-04-09 DIAGNOSIS — Z9181 History of falling: Secondary | ICD-10-CM | POA: Diagnosis not present

## 2020-04-09 DIAGNOSIS — I058 Other rheumatic mitral valve diseases: Secondary | ICD-10-CM | POA: Diagnosis not present

## 2020-04-09 DIAGNOSIS — C787 Secondary malignant neoplasm of liver and intrahepatic bile duct: Secondary | ICD-10-CM | POA: Diagnosis not present

## 2020-04-09 DIAGNOSIS — M5431 Sciatica, right side: Secondary | ICD-10-CM | POA: Diagnosis not present

## 2020-04-15 ENCOUNTER — Telehealth: Payer: Self-pay | Admitting: Hematology and Oncology

## 2020-04-15 ENCOUNTER — Other Ambulatory Visit: Payer: Self-pay | Admitting: Hematology and Oncology

## 2020-04-15 ENCOUNTER — Other Ambulatory Visit: Payer: Self-pay

## 2020-04-15 ENCOUNTER — Inpatient Hospital Stay: Payer: Medicare HMO

## 2020-04-15 VITALS — BP 153/63 | HR 95 | Temp 98.3°F | Resp 18

## 2020-04-15 DIAGNOSIS — C50912 Malignant neoplasm of unspecified site of left female breast: Secondary | ICD-10-CM | POA: Diagnosis not present

## 2020-04-15 DIAGNOSIS — R590 Localized enlarged lymph nodes: Secondary | ICD-10-CM | POA: Diagnosis not present

## 2020-04-15 DIAGNOSIS — Z170 Estrogen receptor positive status [ER+]: Secondary | ICD-10-CM | POA: Diagnosis not present

## 2020-04-15 DIAGNOSIS — C7800 Secondary malignant neoplasm of unspecified lung: Secondary | ICD-10-CM | POA: Diagnosis not present

## 2020-04-15 DIAGNOSIS — C7951 Secondary malignant neoplasm of bone: Secondary | ICD-10-CM | POA: Diagnosis not present

## 2020-04-15 DIAGNOSIS — R609 Edema, unspecified: Secondary | ICD-10-CM | POA: Diagnosis not present

## 2020-04-15 DIAGNOSIS — Z800 Family history of malignant neoplasm of digestive organs: Secondary | ICD-10-CM | POA: Diagnosis not present

## 2020-04-15 DIAGNOSIS — Z79899 Other long term (current) drug therapy: Secondary | ICD-10-CM | POA: Diagnosis not present

## 2020-04-15 DIAGNOSIS — C50919 Malignant neoplasm of unspecified site of unspecified female breast: Secondary | ICD-10-CM

## 2020-04-15 DIAGNOSIS — Z79811 Long term (current) use of aromatase inhibitors: Secondary | ICD-10-CM | POA: Diagnosis not present

## 2020-04-15 DIAGNOSIS — C787 Secondary malignant neoplasm of liver and intrahepatic bile duct: Secondary | ICD-10-CM | POA: Diagnosis not present

## 2020-04-15 MED ORDER — TRASTUZUMAB-ANNS CHEMO 150 MG IV SOLR
300.0000 mg | Freq: Once | INTRAVENOUS | Status: AC
  Administered 2020-04-15: 300 mg via INTRAVENOUS
  Filled 2020-04-15: qty 14.29

## 2020-04-15 MED ORDER — SODIUM CHLORIDE 0.9 % IV SOLN
Freq: Once | INTRAVENOUS | Status: AC
  Filled 2020-04-15: qty 250

## 2020-04-15 MED ORDER — ACETAMINOPHEN 325 MG PO TABS
650.0000 mg | ORAL_TABLET | Freq: Once | ORAL | Status: AC
  Administered 2020-04-15: 650 mg via ORAL

## 2020-04-15 MED ORDER — ACETAMINOPHEN 325 MG PO TABS
ORAL_TABLET | ORAL | Status: AC
  Filled 2020-04-15: qty 2

## 2020-04-15 NOTE — Telephone Encounter (Signed)
Scheduled appointment per 3/15 secure chat with RN Myriam Jacobson. Called patient, no answer. Left message with appointment date and time.

## 2020-04-15 NOTE — Patient Instructions (Signed)
[facility] Discharge Instructions for Patients Receiving Chemotherapy  Today you received the following chemotherapy agents trastuzumab.  To help prevent nausea and vomiting after your treatment, we encourage you to take your nausea medication as directed.    If you develop nausea and vomiting that is not controlled by your nausea medication, call the clinic.   BELOW ARE SYMPTOMS THAT SHOULD BE REPORTED IMMEDIATELY:  *FEVER GREATER THAN 100.5 F  *CHILLS WITH OR WITHOUT FEVER  NAUSEA AND VOMITING THAT IS NOT CONTROLLED WITH YOUR NAUSEA MEDICATION  *UNUSUAL SHORTNESS OF BREATH  *UNUSUAL BRUISING OR BLEEDING  TENDERNESS IN MOUTH AND THROAT WITH OR WITHOUT PRESENCE OF ULCERS  *URINARY PROBLEMS  *BOWEL PROBLEMS  UNUSUAL RASH Items with * indicate a potential emergency and should be followed up as soon as possible.  Feel free to call the clinic should you have any questions or concerns. The clinic phone number is [phone].  Please show the CHEMO ALERT CARD at check-in to the Emergency Department and triage nurse.   

## 2020-04-16 DIAGNOSIS — I071 Rheumatic tricuspid insufficiency: Secondary | ICD-10-CM | POA: Diagnosis not present

## 2020-04-16 DIAGNOSIS — Z9181 History of falling: Secondary | ICD-10-CM | POA: Diagnosis not present

## 2020-04-16 DIAGNOSIS — M5431 Sciatica, right side: Secondary | ICD-10-CM | POA: Diagnosis not present

## 2020-04-16 DIAGNOSIS — J42 Unspecified chronic bronchitis: Secondary | ICD-10-CM | POA: Diagnosis not present

## 2020-04-16 DIAGNOSIS — J910 Malignant pleural effusion: Secondary | ICD-10-CM | POA: Diagnosis not present

## 2020-04-16 DIAGNOSIS — C787 Secondary malignant neoplasm of liver and intrahepatic bile duct: Secondary | ICD-10-CM | POA: Diagnosis not present

## 2020-04-16 DIAGNOSIS — I058 Other rheumatic mitral valve diseases: Secondary | ICD-10-CM | POA: Diagnosis not present

## 2020-04-16 DIAGNOSIS — C50912 Malignant neoplasm of unspecified site of left female breast: Secondary | ICD-10-CM | POA: Diagnosis not present

## 2020-04-16 DIAGNOSIS — Z79899 Other long term (current) drug therapy: Secondary | ICD-10-CM | POA: Diagnosis not present

## 2020-04-18 DIAGNOSIS — J42 Unspecified chronic bronchitis: Secondary | ICD-10-CM | POA: Diagnosis not present

## 2020-04-18 DIAGNOSIS — I058 Other rheumatic mitral valve diseases: Secondary | ICD-10-CM | POA: Diagnosis not present

## 2020-04-18 DIAGNOSIS — J910 Malignant pleural effusion: Secondary | ICD-10-CM | POA: Diagnosis not present

## 2020-04-18 DIAGNOSIS — C787 Secondary malignant neoplasm of liver and intrahepatic bile duct: Secondary | ICD-10-CM | POA: Diagnosis not present

## 2020-04-18 DIAGNOSIS — M5431 Sciatica, right side: Secondary | ICD-10-CM | POA: Diagnosis not present

## 2020-04-18 DIAGNOSIS — I071 Rheumatic tricuspid insufficiency: Secondary | ICD-10-CM | POA: Diagnosis not present

## 2020-04-18 DIAGNOSIS — C50912 Malignant neoplasm of unspecified site of left female breast: Secondary | ICD-10-CM | POA: Diagnosis not present

## 2020-04-18 DIAGNOSIS — Z79899 Other long term (current) drug therapy: Secondary | ICD-10-CM | POA: Diagnosis not present

## 2020-04-18 DIAGNOSIS — Z9181 History of falling: Secondary | ICD-10-CM | POA: Diagnosis not present

## 2020-04-21 ENCOUNTER — Ambulatory Visit: Payer: Medicare HMO

## 2020-04-21 ENCOUNTER — Other Ambulatory Visit: Payer: Self-pay

## 2020-04-21 ENCOUNTER — Other Ambulatory Visit: Payer: Medicare HMO

## 2020-04-21 ENCOUNTER — Inpatient Hospital Stay: Payer: Medicare HMO

## 2020-04-21 VITALS — BP 132/61 | HR 94 | Temp 98.3°F | Resp 18

## 2020-04-21 DIAGNOSIS — C50919 Malignant neoplasm of unspecified site of unspecified female breast: Secondary | ICD-10-CM

## 2020-04-21 DIAGNOSIS — Z170 Estrogen receptor positive status [ER+]: Secondary | ICD-10-CM | POA: Diagnosis not present

## 2020-04-21 DIAGNOSIS — C7800 Secondary malignant neoplasm of unspecified lung: Secondary | ICD-10-CM | POA: Diagnosis not present

## 2020-04-21 DIAGNOSIS — Z79899 Other long term (current) drug therapy: Secondary | ICD-10-CM | POA: Diagnosis not present

## 2020-04-21 DIAGNOSIS — Z800 Family history of malignant neoplasm of digestive organs: Secondary | ICD-10-CM | POA: Diagnosis not present

## 2020-04-21 DIAGNOSIS — C7951 Secondary malignant neoplasm of bone: Secondary | ICD-10-CM

## 2020-04-21 DIAGNOSIS — C787 Secondary malignant neoplasm of liver and intrahepatic bile duct: Secondary | ICD-10-CM | POA: Diagnosis not present

## 2020-04-21 DIAGNOSIS — R590 Localized enlarged lymph nodes: Secondary | ICD-10-CM | POA: Diagnosis not present

## 2020-04-21 DIAGNOSIS — Z79811 Long term (current) use of aromatase inhibitors: Secondary | ICD-10-CM | POA: Diagnosis not present

## 2020-04-21 DIAGNOSIS — C50912 Malignant neoplasm of unspecified site of left female breast: Secondary | ICD-10-CM | POA: Diagnosis not present

## 2020-04-21 DIAGNOSIS — R609 Edema, unspecified: Secondary | ICD-10-CM | POA: Diagnosis not present

## 2020-04-21 LAB — BASIC METABOLIC PANEL
Anion gap: 7 (ref 5–15)
BUN: 14 mg/dL (ref 8–23)
CO2: 24 mmol/L (ref 22–32)
Calcium: 8.4 mg/dL — ABNORMAL LOW (ref 8.9–10.3)
Chloride: 108 mmol/L (ref 98–111)
Creatinine, Ser: 0.62 mg/dL (ref 0.44–1.00)
GFR, Estimated: >60 mL/min (ref >60)
Glucose, Bld: 114 mg/dL — ABNORMAL HIGH (ref 70–99)
Potassium: 3.7 mmol/L (ref 3.5–5.1)
Sodium: 139 mmol/L (ref 135–145)

## 2020-04-21 LAB — CBC WITH DIFFERENTIAL/PLATELET
Abs Immature Granulocytes: 0.03 10*3/uL (ref 0.00–0.07)
Basophils Absolute: 0.1 10*3/uL (ref 0.0–0.1)
Basophils Relative: 1 %
Eosinophils Absolute: 0.1 10*3/uL (ref 0.0–0.5)
Eosinophils Relative: 1 %
HCT: 37.6 % (ref 36.0–46.0)
Hemoglobin: 12.3 g/dL (ref 12.0–15.0)
Immature Granulocytes: 1 %
Lymphocytes Relative: 36 %
Lymphs Abs: 2.1 10*3/uL (ref 0.7–4.0)
MCH: 28.7 pg (ref 26.0–34.0)
MCHC: 32.7 g/dL (ref 30.0–36.0)
MCV: 87.6 fL (ref 80.0–100.0)
Monocytes Absolute: 0.7 10*3/uL (ref 0.1–1.0)
Monocytes Relative: 11 %
Neutro Abs: 2.9 10*3/uL (ref 1.7–7.7)
Neutrophils Relative %: 50 %
Platelets: 428 10*3/uL — ABNORMAL HIGH (ref 150–400)
RBC: 4.29 MIL/uL (ref 3.87–5.11)
RDW: 20.1 % — ABNORMAL HIGH (ref 11.5–15.5)
WBC: 5.8 10*3/uL (ref 4.0–10.5)
nRBC: 0 % (ref 0.0–0.2)

## 2020-04-21 MED ORDER — ZOLEDRONIC ACID 4 MG/5ML IV CONC
3.5000 mg | Freq: Once | INTRAVENOUS | Status: AC
  Administered 2020-04-21: 3.5 mg via INTRAVENOUS
  Filled 2020-04-21: qty 4.38

## 2020-04-21 MED ORDER — ZOLEDRONIC ACID 4 MG/100ML IV SOLN
INTRAVENOUS | Status: AC
  Filled 2020-04-21: qty 100

## 2020-04-21 MED ORDER — ZOLEDRONIC ACID 4 MG/100ML IV SOLN: 4.0000 mg | Freq: Once | INTRAVENOUS | Status: DC

## 2020-04-21 MED ORDER — SODIUM CHLORIDE 0.9 % IV SOLN
Freq: Once | INTRAVENOUS | Status: AC
  Filled 2020-04-21: qty 250

## 2020-04-21 NOTE — Progress Notes (Signed)
CrCl = 53 mL/min Reduce Zometa to 3.5 mg q4 weeks. Pt will take PO Ca++ 500 mg BID. D/W Dr. Chryl Heck.  Kennith Center, Pharm.D., CPP 04/21/2020@12 :48 PM

## 2020-04-21 NOTE — Patient Instructions (Addendum)
Please start taking Calcium + vitamin D 500mg  two (2) times daily. You can buy this at any pharmacy.   Zoledronic Acid Injection (Hypercalcemia, Oncology) What is this medicine? ZOLEDRONIC ACID (ZOE le dron ik AS id) slows calcium loss from bones. It high calcium levels in the blood from some kinds of cancer. It may be used in other people at risk for bone loss. This medicine may be used for other purposes; ask your health care provider or pharmacist if you have questions. COMMON BRAND NAME(S): Zometa What should I tell my health care provider before I take this medicine? They need to know if you have any of these conditions:  cancer  dehydration  dental disease  kidney disease  liver disease  low levels of calcium in the blood  lung or breathing disease (asthma)  receiving steroids like dexamethasone or prednisone  an unusual or allergic reaction to zoledronic acid, other medicines, foods, dyes, or preservatives  pregnant or trying to get pregnant  breast-feeding How should I use this medicine? This drug is injected into a vein. It is given by a health care provider in a hospital or clinic setting. Talk to your health care provider about the use of this drug in children. Special care may be needed. Overdosage: If you think you have taken too much of this medicine contact a poison control center or emergency room at once. NOTE: This medicine is only for you. Do not share this medicine with others. What if I miss a dose? Keep appointments for follow-up doses. It is important not to miss your dose. Call your health care provider if you are unable to keep an appointment. What may interact with this medicine?  certain antibiotics given by injection  NSAIDs, medicines for pain and inflammation, like ibuprofen or naproxen  some diuretics like bumetanide, furosemide  teriparatide  thalidomide This list may not describe all possible interactions. Give your health care  provider a list of all the medicines, herbs, non-prescription drugs, or dietary supplements you use. Also tell them if you smoke, drink alcohol, or use illegal drugs. Some items may interact with your medicine. What should I watch for while using this medicine? Visit your health care provider for regular checks on your progress. It may be some time before you see the benefit from this drug. Some people who take this drug have severe bone, joint, or muscle pain. This drug may also increase your risk for jaw problems or a broken thigh bone. Tell your health care provider right away if you have severe pain in your jaw, bones, joints, or muscles. Tell you health care provider if you have any pain that does not go away or that gets worse. Tell your dentist and dental surgeon that you are taking this drug. You should not have major dental surgery while on this drug. See your dentist to have a dental exam and fix any dental problems before starting this drug. Take good care of your teeth while on this drug. Make sure you see your dentist for regular follow-up appointments. You should make sure you get enough calcium and vitamin D while you are taking this drug. Discuss the foods you eat and the vitamins you take with your health care provider. Check with your health care provider if you have severe diarrhea, nausea, and vomiting, or if you sweat a lot. The loss of too much body fluid may make it dangerous for you to take this drug. You may need blood work done  while you are taking this drug. Do not become pregnant while taking this drug. Women should inform their health care provider if they wish to become pregnant or think they might be pregnant. There is potential for serious harm to an unborn child. Talk to your health care provider for more information. What side effects may I notice from receiving this medicine? Side effects that you should report to your doctor or health care provider as soon as  possible:  allergic reactions (skin rash, itching or hives; swelling of the face, lips, or tongue)  bone pain  infection (fever, chills, cough, sore throat, pain or trouble passing urine)  jaw pain, especially after dental work  joint pain  kidney injury (trouble passing urine or change in the amount of urine)  low blood pressure (dizziness; feeling faint or lightheaded, falls; unusually weak or tired)  low calcium levels (fast heartbeat; muscle cramps or pain; pain, tingling, or numbness in the hands or feet; seizures)  low magnesium levels (fast, irregular heartbeat; muscle cramp or pain; muscle weakness; tremors; seizures)  low red blood cell counts (trouble breathing; feeling faint; lightheaded, falls; unusually weak or tired)  muscle pain  redness, blistering, peeling, or loosening of the skin, including inside the mouth  severe diarrhea  swelling of the ankles, feet, hands  trouble breathing Side effects that usually do not require medical attention (report to your doctor or health care provider if they continue or are bothersome):  anxious  constipation  coughing  depressed mood  eye irritation, itching, or pain  fever  general ill feeling or flu-like symptoms  nausea  pain, redness, or irritation at site where injected  trouble sleeping This list may not describe all possible side effects. Call your doctor for medical advice about side effects. You may report side effects to FDA at 1-800-FDA-1088. Where should I keep my medicine? This drug is given in a hospital or clinic. It will not be stored at home. NOTE: This sheet is a summary. It may not cover all possible information. If you have questions about this medicine, talk to your doctor, pharmacist, or health care provider.  2021 Elsevier/Gold Standard (2018-11-02 09:13:00)

## 2020-04-23 ENCOUNTER — Other Ambulatory Visit: Payer: Medicare HMO | Admitting: Student

## 2020-04-23 ENCOUNTER — Other Ambulatory Visit: Payer: Self-pay

## 2020-04-23 DIAGNOSIS — I071 Rheumatic tricuspid insufficiency: Secondary | ICD-10-CM | POA: Diagnosis not present

## 2020-04-23 DIAGNOSIS — Z79899 Other long term (current) drug therapy: Secondary | ICD-10-CM | POA: Diagnosis not present

## 2020-04-23 DIAGNOSIS — J910 Malignant pleural effusion: Secondary | ICD-10-CM | POA: Diagnosis not present

## 2020-04-23 DIAGNOSIS — Z515 Encounter for palliative care: Secondary | ICD-10-CM | POA: Diagnosis not present

## 2020-04-23 DIAGNOSIS — I058 Other rheumatic mitral valve diseases: Secondary | ICD-10-CM | POA: Diagnosis not present

## 2020-04-23 DIAGNOSIS — Z9181 History of falling: Secondary | ICD-10-CM | POA: Diagnosis not present

## 2020-04-23 DIAGNOSIS — J42 Unspecified chronic bronchitis: Secondary | ICD-10-CM | POA: Diagnosis not present

## 2020-04-23 DIAGNOSIS — M5431 Sciatica, right side: Secondary | ICD-10-CM | POA: Diagnosis not present

## 2020-04-23 DIAGNOSIS — C787 Secondary malignant neoplasm of liver and intrahepatic bile duct: Secondary | ICD-10-CM | POA: Diagnosis not present

## 2020-04-23 DIAGNOSIS — C50912 Malignant neoplasm of unspecified site of left female breast: Secondary | ICD-10-CM | POA: Diagnosis not present

## 2020-04-23 NOTE — Progress Notes (Signed)
[facility] Consult Note Telephone: [phone]  Fax: [phone]  PATIENT NAME: Morgan Yu [place] [id number] [phone] (home)  DOB: January 14, 1941 MRN: [id number]  PRIMARY CARE PROVIDER:    Patient, No Pcp Per,  No address on file None  REFERRING PROVIDER:   Dr. Chryl Heck   RESPONSIBLE PARTY:   Extended Emergency Contact Information Primary Emergency Contact: Davis,Gayle Mobile Phone: [phone] Relation: Relative  I met face to face with patient and family in the home.  ASSESSMENT AND RECOMMENDATIONS:   Advance Care Planning: Visit at the request of Dr. Chryl Heck for palliative consult. Visit consisted of building trust and discussions on Palliative care medicine as specialized medical care for people living with serious illness, aimed at facilitating improved quality of life through symptoms relief, assisting with advance care planning and establishing goals of care. Education provided on Palliative vs. Hospice services. Palliative care will continue to provide support to patient, family and the medical team.  Goal of care: To continue therapy, become more independent, remain comfortable.  Directives:  MOST form. No CPR, comfort measures, determine use or limited antibiotics, no IV fluids, no feeding tube.  Symptom Management:   Metastatic breast cancer-patient currently receiving Arimidex daily and Herceptin every 21 days, Zometa every six weeks; continue as directed. Follow up with Oncology as scheduled. Continue PT as directed with [facility].   Edema-patient with bilateral LE edema, left upper extremity edema. She is encouraged to elevate her feet and legs while resting. She is also encouraged to elevate her left arm on pillow. Continue furosemide 64m daily.   Follow up Palliative Care Visit: Palliative care will continue to follow for complex decision making and symptom  management. Return in 8 weeks or prn. Follow up phone call in 4 weeks.  Family /Caregiver/Community Supports: [facility] will continue to provide support. Recommend life alert if she will be home alone.    I spent 25 minutes providing this consultation, time includes time spent with patient/family, chart review, provider coordination, and documentation. More than 50% of the time in this consultation was spent counseling and coordinating communication.   CHIEF COMPLAINT: Palliative Medicine follow up visit.   History obtained from review of EMR, discussion with primary team, and  interview with family. Records reviewed and summarized below.  HISTORY OF PRESENT ILLNESS:  Morgan DOLINARis a 80y.o. year old female with multiple medical problems including Metastatic breast cancer with pulmonary nodules, liver masses and bone lesions. Palliative Care was asked to follow this patient by consultation request of Dr. IChryl Heckto help address advance care planning and goals of care. This is a follow up visit.  Patient reports feeling better. She is receiving Arimidex daily and Herceptin every 21 days,  Zometa every six weeks. She denies any pain. She states her fatigue and nausea have improved. She endorses a good appetite. No sleep difficulty. She is still receiving PT, [facility] She hopes to increase her independence so that she will be able to stay at home alone again. Started on furosemide 234mdaily d/t LE edema.   CODE STATUS: DNR  PPS: 50%  HOSPICE ELIGIBILITY/DIAGNOSIS: TBD  ROS   General: NAD EYES: denies vision changes ENMT: denies dysphagia Cardiovascular: denies chest pain Pulmonary: denies cough, denies increased SOB Abdomen: endorses good appetite, endorses occ constipation GU: denies dysuria MSK: no falls reported Skin: denies rashes or wounds Neurological:  endorses weakness Psych: Endorses positive  mood Heme/lymph/immuno: denies bruises, abnormal bleeding   Physical Exam:  Constitutional: NAD General: frail appearing, thin EYES: anicteric sclera, lids intact, no discharge  ENMT: intact hearing,oral mucous membranes moist CV: RRR, 1+ LE edema Pulmonary: LCTA, no increased work of breathing, no cough Abdomen: bowel sounds normoactive x 4 GU: deferred MSK: ambulatory Skin: warm and dry, no rashes or wounds on visible skin Neuro: Generalized weakness Psych: pleasant, non-anxious affect today Hem/lymph/immuno: no widespread bruising   PAST MEDICAL HISTORY: No past medical history on file.  SOCIAL HX:  Social History   Tobacco Use  . Smoking status: Never Smoker  . Smokeless tobacco: Never Used  Substance Use Topics  . Alcohol use: Not Currently   FAMILY HX:  Family History  Problem Relation Age of Onset  . Stroke Mother   . Heart attack Father   . Cancer Brother   . Esophageal cancer Maternal Aunt   . Breast cancer Maternal Aunt     ALLERGIES:  Allergies  Allergen Reactions  . Vicodin Hp [Hydrocodone-Acetaminophen]     Brain fog     PERTINENT MEDICATIONS:  Outpatient Encounter Medications as of 04/23/2020  Medication Sig  . acetaminophen (TYLENOL) 500 MG tablet Take 500 mg by mouth every 6 (six) hours as needed for moderate pain.  Marland Kitchen anastrozole (ARIMIDEX) 1 MG tablet Take 1 tablet (1 mg total) by mouth daily.  . feeding supplement (ENSURE ENLIVE / ENSURE PLUS) LIQD Take 237 mLs by mouth 3 (three) times daily between meals. (Patient not taking: No sig reported)  . furosemide (LASIX) 20 MG tablet Take 1 tablet (20 mg total) by mouth daily.  . mirtazapine (REMERON) 15 MG tablet Take 1 tablet (15 mg total) by mouth at bedtime.  . ondansetron (ZOFRAN) 4 MG tablet Take 1 tablet (4 mg total) by mouth every 8 (eight) hours as needed for nausea or vomiting.  . Probiotic Product (PROBIOTIC PO) Take 1 capsule by mouth daily.   No facility-administered encounter  medications on file as of 04/23/2020.     Thank you for the opportunity to participate in the care of Ms. Broadhead. The palliative care team will continue to follow. Please call our office at [phone] if we can be of additional assistance.  Ezekiel Slocumb, NP

## 2020-05-01 ENCOUNTER — Other Ambulatory Visit: Payer: Self-pay | Admitting: Hematology and Oncology

## 2020-05-01 ENCOUNTER — Telehealth: Payer: Self-pay

## 2020-05-01 ENCOUNTER — Encounter: Payer: Self-pay | Admitting: Hematology and Oncology

## 2020-05-01 DIAGNOSIS — J910 Malignant pleural effusion: Secondary | ICD-10-CM | POA: Diagnosis not present

## 2020-05-01 DIAGNOSIS — Z9181 History of falling: Secondary | ICD-10-CM | POA: Diagnosis not present

## 2020-05-01 DIAGNOSIS — C50912 Malignant neoplasm of unspecified site of left female breast: Secondary | ICD-10-CM | POA: Diagnosis not present

## 2020-05-01 DIAGNOSIS — C787 Secondary malignant neoplasm of liver and intrahepatic bile duct: Secondary | ICD-10-CM | POA: Diagnosis not present

## 2020-05-01 DIAGNOSIS — J42 Unspecified chronic bronchitis: Secondary | ICD-10-CM | POA: Diagnosis not present

## 2020-05-01 DIAGNOSIS — Z79899 Other long term (current) drug therapy: Secondary | ICD-10-CM | POA: Diagnosis not present

## 2020-05-01 DIAGNOSIS — M5431 Sciatica, right side: Secondary | ICD-10-CM | POA: Diagnosis not present

## 2020-05-01 DIAGNOSIS — I071 Rheumatic tricuspid insufficiency: Secondary | ICD-10-CM | POA: Diagnosis not present

## 2020-05-01 DIAGNOSIS — I058 Other rheumatic mitral valve diseases: Secondary | ICD-10-CM | POA: Diagnosis not present

## 2020-05-01 NOTE — Progress Notes (Signed)
Called Morgan Yu back. She says the legs are weeping a bit, no redness or concern for infection. I recommended trying lasix 20 mg PO BID for 3 days. I recommended checking BP before 2 nd dose of lasix, only to give it if her BP is greater than 110/70. Also advised to eat a banana daily while on lasix. I offered sooner appointment, Morgan Yu didn't feel like that's needed.  [name]

## 2020-05-01 NOTE — Telephone Encounter (Signed)
Scheduling message to sent per Dr. Rob Hickman request to have patient's trastuzumab scheduled every 3 weeks and for their zometa infusion to be scheduled every 4 weeks.

## 2020-05-02 ENCOUNTER — Telehealth: Payer: Self-pay | Admitting: Hematology and Oncology

## 2020-05-02 NOTE — Telephone Encounter (Signed)
Scheduled appts per 3/31 sch msg. Called pt, no answer. Left msg with appts dates and times.  

## 2020-05-05 ENCOUNTER — Other Ambulatory Visit: Payer: Self-pay | Admitting: Hematology and Oncology

## 2020-05-06 ENCOUNTER — Inpatient Hospital Stay: Payer: Medicare HMO | Attending: Hematology and Oncology | Admitting: Hematology and Oncology

## 2020-05-06 ENCOUNTER — Encounter: Payer: Self-pay | Admitting: Hematology and Oncology

## 2020-05-06 ENCOUNTER — Other Ambulatory Visit: Payer: Self-pay | Admitting: Hematology and Oncology

## 2020-05-06 ENCOUNTER — Inpatient Hospital Stay: Payer: Medicare HMO

## 2020-05-06 ENCOUNTER — Other Ambulatory Visit: Payer: Self-pay

## 2020-05-06 ENCOUNTER — Telehealth: Payer: Self-pay | Admitting: Hematology and Oncology

## 2020-05-06 VITALS — BP 131/56 | HR 93 | Temp 97.8°F | Resp 18 | Ht 60.0 in | Wt 108.5 lb

## 2020-05-06 DIAGNOSIS — C50912 Malignant neoplasm of unspecified site of left female breast: Secondary | ICD-10-CM | POA: Diagnosis not present

## 2020-05-06 DIAGNOSIS — Z803 Family history of malignant neoplasm of breast: Secondary | ICD-10-CM | POA: Diagnosis not present

## 2020-05-06 DIAGNOSIS — C50919 Malignant neoplasm of unspecified site of unspecified female breast: Secondary | ICD-10-CM

## 2020-05-06 DIAGNOSIS — Z800 Family history of malignant neoplasm of digestive organs: Secondary | ICD-10-CM | POA: Diagnosis not present

## 2020-05-06 DIAGNOSIS — Z9221 Personal history of antineoplastic chemotherapy: Secondary | ICD-10-CM | POA: Insufficient documentation

## 2020-05-06 DIAGNOSIS — Z5112 Encounter for antineoplastic immunotherapy: Secondary | ICD-10-CM | POA: Diagnosis present

## 2020-05-06 DIAGNOSIS — Z79899 Other long term (current) drug therapy: Secondary | ICD-10-CM | POA: Diagnosis not present

## 2020-05-06 DIAGNOSIS — C7951 Secondary malignant neoplasm of bone: Secondary | ICD-10-CM | POA: Diagnosis not present

## 2020-05-06 DIAGNOSIS — R609 Edema, unspecified: Secondary | ICD-10-CM | POA: Diagnosis not present

## 2020-05-06 LAB — CBC WITH DIFFERENTIAL/PLATELET
Abs Immature Granulocytes: 0.02 10*3/uL (ref 0.00–0.07)
Basophils Absolute: 0 10*3/uL (ref 0.0–0.1)
Basophils Relative: 1 %
Eosinophils Absolute: 0.1 10*3/uL (ref 0.0–0.5)
Eosinophils Relative: 1 %
HCT: 35.6 % — ABNORMAL LOW (ref 36.0–46.0)
Hemoglobin: 11.2 g/dL — ABNORMAL LOW (ref 12.0–15.0)
Immature Granulocytes: 0 %
Lymphocytes Relative: 26 %
Lymphs Abs: 1.7 10*3/uL (ref 0.7–4.0)
MCH: 28.5 pg (ref 26.0–34.0)
MCHC: 31.5 g/dL (ref 30.0–36.0)
MCV: 90.6 fL (ref 80.0–100.0)
Monocytes Absolute: 0.6 10*3/uL (ref 0.1–1.0)
Monocytes Relative: 10 %
Neutro Abs: 3.9 10*3/uL (ref 1.7–7.7)
Neutrophils Relative %: 62 %
Platelets: 355 10*3/uL (ref 150–400)
RBC: 3.93 MIL/uL (ref 3.87–5.11)
RDW: 18.4 % — ABNORMAL HIGH (ref 11.5–15.5)
WBC: 6.3 10*3/uL (ref 4.0–10.5)
nRBC: 0 % (ref 0.0–0.2)

## 2020-05-06 MED ORDER — TRASTUZUMAB-ANNS CHEMO 150 MG IV SOLR
300.0000 mg | Freq: Once | INTRAVENOUS | Status: AC
  Administered 2020-05-06: 300 mg via INTRAVENOUS
  Filled 2020-05-06: qty 14.29

## 2020-05-06 MED ORDER — TRASTUZUMAB-ANNS CHEMO 150 MG IV SOLR: 2.0000 mg/kg | Freq: Once | INTRAVENOUS | Status: DC

## 2020-05-06 MED ORDER — SODIUM CHLORIDE 0.9 % IV SOLN
Freq: Once | INTRAVENOUS | Status: AC
Start: 2020-05-06 — End: 2020-05-06
  Filled 2020-05-06: qty 250

## 2020-05-06 NOTE — Patient Instructions (Signed)
[facility] Discharge Instructions for Patients Receiving Chemotherapy  Today you received the following chemotherapy agents trastuzumab.  To help prevent nausea and vomiting after your treatment, we encourage you to take your nausea medication as directed.    If you develop nausea and vomiting that is not controlled by your nausea medication, call the clinic.   BELOW ARE SYMPTOMS THAT SHOULD BE REPORTED IMMEDIATELY:  *FEVER GREATER THAN 100.5 F  *CHILLS WITH OR WITHOUT FEVER  NAUSEA AND VOMITING THAT IS NOT CONTROLLED WITH YOUR NAUSEA MEDICATION  *UNUSUAL SHORTNESS OF BREATH  *UNUSUAL BRUISING OR BLEEDING  TENDERNESS IN MOUTH AND THROAT WITH OR WITHOUT PRESENCE OF ULCERS  *URINARY PROBLEMS  *BOWEL PROBLEMS  UNUSUAL RASH Items with * indicate a potential emergency and should be followed up as soon as possible.  Feel free to call the clinic should you have any questions or concerns. The clinic phone number is [phone].  Please show the CHEMO ALERT CARD at check-in to the Emergency Department and triage nurse.   

## 2020-05-06 NOTE — Telephone Encounter (Signed)
Scheduled per 04/05 los, patient will be notified per My chart.

## 2020-05-06 NOTE — Progress Notes (Signed)
[place] FOLLOW UP NOTE  Patient Care Team: Patient, [name] as PCP - General (General Practice) [name], MD as Consulting Physician (Hematology and Oncology)  CHIEF COMPLAINTS/PURPOSE OF CONSULTATION:  New diagnosis of metastatic breast cancer.  ASSESSMENT & PLAN:  No problem-specific Assessment & Plan notes found for this encounter.  Orders Placed This Encounter  Procedures  . Ambulatory referral to Radiation Oncology    Referral Priority:   Routine    Referral Type:   Consultation    Referral Reason:   Specialty Services Required    Requested Specialty:   Radiation Oncology    Number of Visits Requested:   1   Metastatic breast cancer with pulmonary nodules, liver masses and bone lesions. - Given her older age and co-morbidities, we started her on subcutaneous Herceptin, Arimidex and she is here for follow-up. -She started arimidex on 03/11/2020, tolerating it very well so far, no adverse effects reported. - she is tolerating [place] herceptin reasonably well, LN in neck have resolved, breast mass and axillary mass have improved.   - She continues to gain more energy, continues to engage in physical therapy and Occupational therapy. - We again discussed that her metastatic breast cancer is not curable but is treatable. -Since she is tolerating Herceptin very well, discussed about Pertuzumab. She understands that Perjeta can cause diarrhea. Morgan Yu will keep [place] posted if we can move forward with dual antibody therapy. Physical examination, breast findings appear improved, no palpable lymphadenopathy in the neck, palpable axillary lymphadenopathy improving and severe left upper extremity swelling, moderate bilateral lower extremity swelling.  Overall plan is to continue Arimidex, Herceptin, monitor, so far excellent clinical response. Repeat imaging ordered to assess response. ECHO ordered, due end of April since she is on herceptin.  2. Left arm  lymphedema due to large lymphadenopathy in the left axilla I have again brought up the idea of palliative radiation to the axilla She agreed to referral to radiation oncology, this has been placed.  3 BLE ankle edema, improved since last visit. I prescribed her Lasix 20 mg once a day for bilateral lower extremity edema. Have asked her to elevate her legs and use some wraps to control the edema.  She will continue to monitor  4. Bone mets, will start her on zometa monthly and transition to every 3 months after next infusion.  Return to clinic in 3 weeks before next subcutaneous Herceptin infusion  HISTORY OF PRESENTING ILLNESS:   Morgan Yu 80 y.o. female is here because of newly diagnosed metastatic breast cancer.  Oncology History  Carcinoma of breast metastatic to bone ([place])  03/19/2020 Initial Diagnosis   Carcinoma of breast metastatic to bone ([place])   03/24/2020 -  Chemotherapy    Patient is on Treatment Plan: BREAST TRASTUZUMAB Q21D      04/15/2020 Cancer Staging   Staging form: Breast, AJCC 8th Edition - Clinical stage from 04/15/2020: Stage IV (cTX, cNX, cM1, GX, ER+, PR-, HER2+) - Signed by [name], MD on 04/15/2020 Stage prefix: Initial diagnosis Histologic grading system: 3 grade system     Morgan. Morgan Yu is a 80 year old female with no significant past medical history presented to the emergency department with weakness.  She has a left breast mass and developed left arm swelling.  The patient thinks that the breast mass and swelling have been present for at least a few weeks to months.  She states that she initially developed pruritus and later a lump  involving the left breast.  Left arm swelling has been more recent.  Last mammogram was many years ago.  CTA chest and CT of the abdomen pelvis were performed which showed no evidence of PE but did show moderate bilateral pleural effusions, nodules in the right middle lung and right lower lung likely metastatic, diffuse  hepatic metastasis, diffuse skeletal metastasis.    The patient is widowed and lives alone.  She has no biological children but has 2 stepchildren.  She has friends and family who check on her.  Denies history of alcohol tobacco use.    Family history significant for a maternal aunt with esophageal cancer (smoker) and a paternal aunt with breast cancer in her 42s.  The patient reports menarche around age 77 and menopause at around age 12.  Used hormone replacement therapy for about 1 year around age 61.    Pathology confirmed metastatic carcinoma, breast primary, ER positive PR negative and Her 2 amplified by IHC   Interim History  She is here for follow-up with her cousin/daughter. She feels better compared to last visit.  She is able to do more chores at home.  She does not believe that her lower extremity swelling has gotten any worse. She is now acceptable to talking with radiation oncology to discuss palliative radiation.  She thinks the breast mass appears better. No pain issues. No change in breathing, bowel habits or urinary habits. Intermittent constipation which is relieved by as needed laxatives  Rest of the pertinent 10 point ROS reviewed and negative.   MEDICAL HISTORY:  No past medical history on file.  SURGICAL HISTORY: No past surgical history on file.  SOCIAL HISTORY: Social History   Socioeconomic History  . Marital status: Widowed    Spouse name: Not on file  . Number of children: Not on file  . Years of education: Not on file  . Highest education level: Not on file  Occupational History  . Not on file  Tobacco Use  . Smoking status: Never Smoker  . Smokeless tobacco: Never Used  Substance and Sexual Activity  . Alcohol use: Not Currently  . Drug use: Never  . Sexual activity: Not on file  Other Topics Concern  . Not on file  Social History Narrative   No biological children   Has 2 stepchildren   Social Determinants of Health   Financial  Resource Strain: Not on file  Food Insecurity: Not on file  Transportation Needs: Not on file  Physical Activity: Not on file  Stress: Not on file  Social Connections: Not on file  Intimate Partner Violence: Not on file    FAMILY HISTORY: Family History  Problem Relation Age of Onset  . Stroke Mother   . Heart attack Father   . Cancer Brother   . Esophageal cancer Maternal Aunt   . Breast cancer Maternal Aunt     ALLERGIES:  is allergic to vicodin hp [hydrocodone-acetaminophen].  MEDICATIONS:  Current Outpatient Medications  Medication Sig Dispense Refill  . acetaminophen (TYLENOL) 500 MG tablet Take 500 mg by mouth every 6 (six) hours as needed for moderate pain.    Marland Kitchen anastrozole (ARIMIDEX) 1 MG tablet TAKE 1 TABLET BY MOUTH EVERY DAY 30 tablet 1  . feeding supplement (ENSURE ENLIVE / ENSURE PLUS) LIQD Take 237 mLs by mouth 3 (three) times daily between meals. (Patient not taking: No sig reported) 237 mL 12  . furosemide (LASIX) 20 MG tablet TAKE 1 TABLET BY MOUTH EVERY DAY  30 tablet 0  . mirtazapine (REMERON) 15 MG tablet Take 1 tablet (15 mg total) by mouth at bedtime. 30 tablet 0  . ondansetron (ZOFRAN) 4 MG tablet Take 1 tablet (4 mg total) by mouth every 8 (eight) hours as needed for nausea or vomiting. 40 tablet 1  . Probiotic Product (PROBIOTIC PO) Take 1 capsule by mouth daily.     No current facility-administered medications for this visit.    PHYSICAL EXAMINATION:  ECOG PERFORMANCE STATUS: 0 - Asymptomatic  Vitals:   05/06/20 1346  BP: (!) 131/56  Pulse: 93  Resp: 18  Temp: 97.8 F (36.6 C)  SpO2: 94%   Filed Weights   05/06/20 1346  Weight: 108 lb 8 oz (49.2 kg)    GENERAL: alert, no distress and comfortable, in a wheel chair SKIN: skin color, texture, turgor are normal, no rashes or significant lesions Lymphatics:  On today's exam, I could not feel any of her cervical lymphadenopathy, axillary lymph nodes are still palpable but smaller. BREAST: Left  breast with erythema, skin involvement noted, palpable mass in the left breast occupying pretty much the entire breast,  this has significantly improved compared to the last visit. She usually doesn't like her breast examined but I explained this is important for [place] to assess treatment response. Respiratory: lungs were clear bilaterally without wheezing or crackles.   Cardiovascular:  Regular rate and rhythm, S1/S2, without murmur, rub or gallop.  There was no pedal edema.   GI:  abdomen was soft, flat, nontender, nondistended, without organomegaly.   Musculoskeletal:  Bilateral symmetrical 1-2+ lower extremity swelling.   left upper extremity swelling from axillary lymphadenopathy, improved. Skin exam was without echymosis, petichae.   Neuro exam was nonfocal. Patient was alert and oriented.  Attention was good.   Language was appropriate.  Mood was normal without depression.  Speech was not pressured.  Thought content was not tangential.    LABORATORY DATA:  I have reviewed the data as listed Lab Results  Component Value Date   WBC 6.3 05/06/2020   HGB 11.2 (L) 05/06/2020   HCT 35.6 (L) 05/06/2020   MCV 90.6 05/06/2020   PLT 355 05/06/2020     Chemistry      Component Value Date/Time   NA 139 04/21/2020 1103   K 3.7 04/21/2020 1103   CL 108 04/21/2020 1103   CO2 24 04/21/2020 1103   BUN 14 04/21/2020 1103   CREATININE 0.62 04/21/2020 1103   CREATININE 0.71 03/24/2020 0824      Component Value Date/Time   CALCIUM 8.4 (L) 04/21/2020 1103   ALKPHOS 1,262 (H) 03/24/2020 0824   AST 184 (HH) 03/24/2020 0824   ALT 28 03/24/2020 0824   BILITOT 1.2 03/24/2020 0824       RADIOGRAPHIC STUDIES: I have personally reviewed the radiological images as listed and agreed with the findings in the report. No results found.  wk ago        All questions were answered. The patient knows to call the clinic with any problems, questions or concerns. I spent 40 minutes in the care of this  patient including history, physical, review of records and counseling and coordination of care    [name], MD 05/06/2020 5:00 PM

## 2020-05-08 NOTE — Progress Notes (Signed)
Location of Breast Cancer:left breast cancer with bone metastasis and left axilla lymphedema  Histology per Pathology Report: lymph node biopsy on 02/29/2020   Receptor Status:    Did patient present with symptoms (if so, please note symptoms) or was this found on screening mammography?:    Past/Anticipated interventions by surgeon, if any: none  Past/Anticipated interventions by medical oncology, if any:     Lymphedema issues, if any:  yes left arm  Pain issues, if any:  no   SAFETY ISSUES:  Prior radiation? no  Pacemaker/ICD? no  Possible current pregnancy?no  Is the patient on methotrexate? no  Current Complaints / other details:  Left hand and arm swelling which decreases when elevated.  Vitals:   05/12/20 1334  BP: (!) 146/73  Pulse: 97  Resp: 20  Temp: 97.6 F (36.4 C)  SpO2: 96%  Weight: 109 lb 6.4 oz (49.6 kg)  Height: 5' 1.5" (1.562 m)

## 2020-05-12 ENCOUNTER — Encounter: Payer: Self-pay | Admitting: Radiation Oncology

## 2020-05-12 ENCOUNTER — Ambulatory Visit
Admission: RE | Admit: 2020-05-12 | Discharge: 2020-05-12 | Disposition: A | Discharge status: Home / Self Care | Payer: Medicare HMO | Source: Ambulatory Visit | Attending: Radiation Oncology | Admitting: Radiation Oncology

## 2020-05-12 ENCOUNTER — Other Ambulatory Visit: Payer: Self-pay

## 2020-05-12 VITALS — BP 146/73 | HR 97 | Temp 97.6°F | Resp 20 | Ht 61.5 in | Wt 109.4 lb

## 2020-05-12 DIAGNOSIS — C787 Secondary malignant neoplasm of liver and intrahepatic bile duct: Secondary | ICD-10-CM | POA: Diagnosis not present

## 2020-05-12 DIAGNOSIS — Z79899 Other long term (current) drug therapy: Secondary | ICD-10-CM | POA: Diagnosis not present

## 2020-05-12 DIAGNOSIS — Z170 Estrogen receptor positive status [ER+]: Secondary | ICD-10-CM | POA: Diagnosis not present

## 2020-05-12 DIAGNOSIS — C7951 Secondary malignant neoplasm of bone: Secondary | ICD-10-CM

## 2020-05-12 DIAGNOSIS — Z79811 Long term (current) use of aromatase inhibitors: Secondary | ICD-10-CM | POA: Diagnosis not present

## 2020-05-12 DIAGNOSIS — C50912 Malignant neoplasm of unspecified site of left female breast: Secondary | ICD-10-CM

## 2020-05-12 DIAGNOSIS — C773 Secondary and unspecified malignant neoplasm of axilla and upper limb lymph nodes: Secondary | ICD-10-CM | POA: Diagnosis not present

## 2020-05-12 HISTORY — DX: Malignant neoplasm of unspecified site of unspecified female breast: C50.919

## 2020-05-12 NOTE — Progress Notes (Signed)
Refer to Dr Clabe Seal note for nursing evaluation.

## 2020-05-12 NOTE — Progress Notes (Signed)
Radiation Oncology         [phone] ________________________________  Initial Outpatient Consultation  Name: Morgan Yu MRN: [id number]  Date: 05/12/2020  DOB: October 31, 1940  VX:[id number], [name]  [name], MD   REFERRING PHYSICIAN: [name], MD  DIAGNOSIS: The primary encounter diagnosis was Malignant neoplasm metastatic to bone [facility]). A diagnosis of Carcinoma of left breast metastatic to bone [facility]) was also pertinent to this visit.  Stage IV left breast cancer, ER+ / PR- / Her2+, grade 3  HISTORY OF PRESENT ILLNESS::Morgan Yu is a 80 y.o. female who is seen as a courtesy of Dr. Chryl Heck for an opinion concerning radiation therapy as part of management for her recently diagnosed breast cancer. Today, she is accompanied by a cousin. She was seen in the ED on 02/27/2020 for increased generalized weakness, left arm swelling, and left breast mass. CTA of chest/CT of abdomen and pelvis at that time showed non-calcified nodules in the right middle lung and right lower lung, likely metastatic. There was also noted to be diffuse hepatic and skeletal metastases. Additionally, there were prominent upper abdominal varices, a small amount of free fluid in the abdomen and pelvis (reactive vs peritoneal metastasis), and edema in the subcutaneous fat throughout the abdominal wall. The patient was admitted to the hospital for further evaluation and management.  MRI of brain on 02/28/2020 showed mild chronic microvascular ischemic changes and diffuse osseous metastatic disease, but no evidence of intracranial metastatic disease.  Biopsy of the left chest wall mass/axillary lymph node on 02/29/2020 revealed metastatic carcinoma. The morphology was consistent with a breast primary, ER positive, PR negative, Her2 positive.  Subsequently, the patient was seen by Dr. Chryl Heck. Given her age and co-morbidities, she was started on subcutaneous Herceptin and Arimidex on 03/11/2020. She  has been tolerating treatment well with no adverse side effects.  Patient has had significant and continued lymphedema in the left arm related to her breast cancer and she is now seen in radiation oncology consideration for palliative treatment  PREVIOUS RADIATION THERAPY: No  PAST MEDICAL HISTORY:  Past Medical History:  Diagnosis Date  . Breast cancer ([place])    with bone metastasis    PAST SURGICAL HISTORY:No past surgical history on file.  FAMILY HISTORY:  Family History  Problem Relation Age of Onset  . Stroke Mother   . Heart attack Father   . Cancer Brother   . Esophageal cancer Maternal Aunt   . Breast cancer Maternal Aunt     SOCIAL HISTORY:  Social History   Tobacco Use  . Smoking status: Never Smoker  . Smokeless tobacco: Never Used  Substance Use Topics  . Alcohol use: Not Currently  . Drug use: Never    ALLERGIES:  Allergies  Allergen Reactions  . Vicodin Hp [Hydrocodone-Acetaminophen]     Brain fog    MEDICATIONS:  Current Outpatient Medications  Medication Sig Dispense Refill  . acetaminophen (TYLENOL) 500 MG tablet Take 500 mg by mouth every 6 (six) hours as needed for moderate pain.    Marland Kitchen anastrozole (ARIMIDEX) 1 MG tablet TAKE 1 TABLET BY MOUTH EVERY DAY 30 tablet 1  . furosemide (LASIX) 20 MG tablet TAKE 1 TABLET BY MOUTH EVERY DAY 30 tablet 0  . Probiotic Product (PROBIOTIC PO) Take 1 capsule by mouth daily.    . feeding supplement (ENSURE ENLIVE / ENSURE PLUS) LIQD Take 237 mLs by mouth 3 (three) times daily between meals. (Patient not taking: No sig reported)  237 mL 12  . mirtazapine (REMERON) 15 MG tablet Take 1 tablet (15 mg total) by mouth at bedtime. 30 tablet 0  . ondansetron (ZOFRAN) 4 MG tablet Take 1 tablet (4 mg total) by mouth every 8 (eight) hours as needed for nausea or vomiting. (Patient not taking: Reported on 05/12/2020) 40 tablet 1   No current facility-administered medications for this encounter.    REVIEW OF SYSTEMS:  A 10+  POINT REVIEW OF SYSTEMS WAS OBTAINED including neurology, dermatology, psychiatry, cardiac, respiratory, lymph, extremities, GI, GU, musculoskeletal, constitutional, reproductive, HEENT.  She denies any areas of bony pain at this time.  She denies any headaches dizziness or blurred vision.   PHYSICAL EXAM:  height is 5' 1.5" (1.562 m) and weight is 109 lb 6.4 oz (49.6 kg). Her temperature is 97.6 F (36.4 C). Her blood pressure is 146/73 (abnormal) and her pulse is 97. Her respiration is 20 and oxygen saturation is 96%.   General: Alert and oriented, in no acute distress HEENT: Head is normocephalic. Extraocular movements are intact.  Neck: Neck is supple, no palpable cervical or supraclavicular lymphadenopathy. Heart: Regular in rate and rhythm with no murmurs, rubs, or gallops. Chest: Clear to auscultation bilaterally, with no rhonchi, wheezes, or rales. Abdomen: Soft, nontender, nondistended, with no rigidity or guarding. Extremities: No cyanosis or edema. Lymphatics: Patient has significant pitting edema in her left upper extremity.  She has significant limitation of her abduction of the left arm related to this issue. Skin: No concerning lesions. Musculoskeletal: symmetric strength and muscle tone throughout. Neurologic: Cranial nerves II through XII are grossly intact. No obvious focalities. Speech is fluent. Coordination is intact. Psychiatric: Judgment and insight are intact. Affect is appropriate. Right breast: No palpable mass, nipple discharge, or bleeding. Left breast: Replaced by hard mass occupying most of the breast.  The nipple areolar complex area is essentially missing.  Some erythema noted to the medial aspect of the breast area.  No drainage or bleeding from the breast.  ECOG = 1  0 - Asymptomatic (Fully active, able to carry on all predisease activities without restriction)  1 - Symptomatic but completely ambulatory (Restricted in physically strenuous activity but  ambulatory and able to carry out work of a light or sedentary nature. For example, light housework, office work)  2 - Symptomatic, <50% in bed during the day (Ambulatory and capable of all self care but unable to carry out any work activities. Up and about more than 50% of waking hours)  3 - Symptomatic, >50% in bed, but not bedbound (Capable of only limited self-care, confined to bed or chair 50% or more of waking hours)  4 - Bedbound (Completely disabled. Cannot carry on any self-care. Totally confined to bed or chair)  5 - Death   Eustace Pen MM, Creech RH, Tormey DC, et al. [phone]). "Toxicity and response criteria of the [facility]". [place] Oncol. 5 (6): 649-55  LABORATORY DATA:  Lab Results  Component Value Date   WBC 6.3 05/06/2020   HGB 11.2 (L) 05/06/2020   HCT 35.6 (L) 05/06/2020   MCV 90.6 05/06/2020   PLT 355 05/06/2020   NEUTROABS 3.9 05/06/2020   Lab Results  Component Value Date   NA 139 04/21/2020   K 3.7 04/21/2020   CL 108 04/21/2020   CO2 24 04/21/2020   GLUCOSE 114 (H) 04/21/2020   CREATININE 0.62 04/21/2020   CALCIUM 8.4 (L) 04/21/2020      RADIOGRAPHY: No results found.  IMPRESSION: Stage IV left breast cancer, ER+ / PR- / Her2+, grade 3  Patient has had improvement in overall condition related to her subcutaneous Herceptin and Arimidex.  She however continues to have significant lymphedema in her left arm related to her extensive adenopathy in the left axillary region.  This has not improved significantly with her treatments thus far.  Patient would be a candidate for palliative radiation therapy directed to this region.  I discussed the overall treatment course side effects and potential toxicities of radiation therapy with the patient.  She appears to understand and wishes to proceed with palliative radiation therapy.  Depending on the overall volume she would receive between 3 and 5 weeks of daily radiation therapy.    PLAN:  Patient will return tomorrow for CT simulation with treatments to begin early next week.  Anticipate between 3 and 5 weeks of radiation therapy.  Total time spent in this encounter was 60 minutes which included reviewing the patient's most recent ED visit, hospitalization, biopsy, CT scans, MRI of brain, consultations, follow-ups, physical examination, and documentation.  ------------------------------------------------  Blair Promise, PhD, MD  This document serves as a record of services personally performed by Gery Pray, MD. It was created on his behalf by Clerance Lav, a trained medical scribe. The creation of this record is based on the scribe's personal observations and the provider's statements to them. This document has been checked and approved by the attending provider.

## 2020-05-13 ENCOUNTER — Ambulatory Visit: Payer: Medicare HMO | Admitting: Radiation Oncology

## 2020-05-14 ENCOUNTER — Telehealth: Payer: Self-pay | Admitting: Medical Oncology

## 2020-05-14 ENCOUNTER — Other Ambulatory Visit: Payer: Self-pay

## 2020-05-14 ENCOUNTER — Ambulatory Visit
Admission: RE | Admit: 2020-05-14 | Discharge: 2020-05-14 | Disposition: A | Discharge status: Home / Self Care | Payer: Medicare HMO | Source: Ambulatory Visit | Attending: Radiation Oncology | Admitting: Radiation Oncology

## 2020-05-14 DIAGNOSIS — C7951 Secondary malignant neoplasm of bone: Secondary | ICD-10-CM | POA: Diagnosis not present

## 2020-05-14 DIAGNOSIS — J910 Malignant pleural effusion: Secondary | ICD-10-CM | POA: Diagnosis not present

## 2020-05-14 DIAGNOSIS — I058 Other rheumatic mitral valve diseases: Secondary | ICD-10-CM | POA: Diagnosis not present

## 2020-05-14 DIAGNOSIS — Z79899 Other long term (current) drug therapy: Secondary | ICD-10-CM | POA: Diagnosis not present

## 2020-05-14 DIAGNOSIS — Z510 Encounter for antineoplastic radiation therapy: Secondary | ICD-10-CM | POA: Insufficient documentation

## 2020-05-14 DIAGNOSIS — C50919 Malignant neoplasm of unspecified site of unspecified female breast: Secondary | ICD-10-CM

## 2020-05-14 DIAGNOSIS — C773 Secondary and unspecified malignant neoplasm of axilla and upper limb lymph nodes: Secondary | ICD-10-CM | POA: Diagnosis not present

## 2020-05-14 DIAGNOSIS — C50912 Malignant neoplasm of unspecified site of left female breast: Secondary | ICD-10-CM | POA: Diagnosis not present

## 2020-05-14 DIAGNOSIS — M6281 Muscle weakness (generalized): Secondary | ICD-10-CM | POA: Diagnosis not present

## 2020-05-14 DIAGNOSIS — C787 Secondary malignant neoplasm of liver and intrahepatic bile duct: Secondary | ICD-10-CM | POA: Diagnosis not present

## 2020-05-14 DIAGNOSIS — J42 Unspecified chronic bronchitis: Secondary | ICD-10-CM | POA: Diagnosis not present

## 2020-05-14 DIAGNOSIS — M5431 Sciatica, right side: Secondary | ICD-10-CM | POA: Diagnosis not present

## 2020-05-14 DIAGNOSIS — I071 Rheumatic tricuspid insufficiency: Secondary | ICD-10-CM | POA: Diagnosis not present

## 2020-05-14 DIAGNOSIS — Z9181 History of falling: Secondary | ICD-10-CM | POA: Diagnosis not present

## 2020-05-14 NOTE — Telephone Encounter (Signed)
[facility] Denial letter received .  There is no record that these preferred  drugs were tried: Herceptin , herceptin Hylecta or Trazimera or that there are medical reasons not to use them.  Kanjinti approved per Drucie Opitz .

## 2020-05-19 ENCOUNTER — Inpatient Hospital Stay: Payer: Medicare HMO

## 2020-05-19 ENCOUNTER — Other Ambulatory Visit: Payer: Self-pay

## 2020-05-19 VITALS — BP 151/62 | HR 89 | Temp 98.0°F | Resp 24 | Wt 112.2 lb

## 2020-05-19 DIAGNOSIS — Z803 Family history of malignant neoplasm of breast: Secondary | ICD-10-CM | POA: Diagnosis not present

## 2020-05-19 DIAGNOSIS — R609 Edema, unspecified: Secondary | ICD-10-CM | POA: Diagnosis not present

## 2020-05-19 DIAGNOSIS — Z5112 Encounter for antineoplastic immunotherapy: Secondary | ICD-10-CM | POA: Diagnosis not present

## 2020-05-19 DIAGNOSIS — Z800 Family history of malignant neoplasm of digestive organs: Secondary | ICD-10-CM | POA: Diagnosis not present

## 2020-05-19 DIAGNOSIS — C773 Secondary and unspecified malignant neoplasm of axilla and upper limb lymph nodes: Secondary | ICD-10-CM | POA: Diagnosis not present

## 2020-05-19 DIAGNOSIS — C50919 Malignant neoplasm of unspecified site of unspecified female breast: Secondary | ICD-10-CM

## 2020-05-19 DIAGNOSIS — Z9221 Personal history of antineoplastic chemotherapy: Secondary | ICD-10-CM | POA: Diagnosis not present

## 2020-05-19 DIAGNOSIS — C50912 Malignant neoplasm of unspecified site of left female breast: Secondary | ICD-10-CM | POA: Diagnosis not present

## 2020-05-19 DIAGNOSIS — C7951 Secondary malignant neoplasm of bone: Secondary | ICD-10-CM

## 2020-05-19 DIAGNOSIS — Z510 Encounter for antineoplastic radiation therapy: Secondary | ICD-10-CM | POA: Diagnosis not present

## 2020-05-19 DIAGNOSIS — Z79899 Other long term (current) drug therapy: Secondary | ICD-10-CM | POA: Diagnosis not present

## 2020-05-19 LAB — BASIC METABOLIC PANEL
Anion gap: 13 (ref 5–15)
BUN: 17 mg/dL (ref 8–23)
CO2: 25 mmol/L (ref 22–32)
Calcium: 8.7 mg/dL — ABNORMAL LOW (ref 8.9–10.3)
Chloride: 107 mmol/L (ref 98–111)
Creatinine, Ser: 0.69 mg/dL (ref 0.44–1.00)
GFR, Estimated: >60 mL/min (ref >60)
Glucose, Bld: 121 mg/dL — ABNORMAL HIGH (ref 70–99)
Potassium: 3.2 mmol/L — ABNORMAL LOW (ref 3.5–5.1)
Sodium: 145 mmol/L (ref 135–145)

## 2020-05-19 LAB — CBC WITH DIFFERENTIAL/PLATELET
Abs Immature Granulocytes: 0.01 10*3/uL (ref 0.00–0.07)
Basophils Absolute: 0 10*3/uL (ref 0.0–0.1)
Basophils Relative: 1 %
Eosinophils Absolute: 0.1 10*3/uL (ref 0.0–0.5)
Eosinophils Relative: 1 %
HCT: 36.7 % (ref 36.0–46.0)
Hemoglobin: 11.5 g/dL — ABNORMAL LOW (ref 12.0–15.0)
Immature Granulocytes: 0 %
Lymphocytes Relative: 26 %
Lymphs Abs: 1.6 10*3/uL (ref 0.7–4.0)
MCH: 28.6 pg (ref 26.0–34.0)
MCHC: 31.3 g/dL (ref 30.0–36.0)
MCV: 91.3 fL (ref 80.0–100.0)
Monocytes Absolute: 0.6 10*3/uL (ref 0.1–1.0)
Monocytes Relative: 9 %
Neutro Abs: 4 10*3/uL (ref 1.7–7.7)
Neutrophils Relative %: 63 %
Platelets: 350 10*3/uL (ref 150–400)
RBC: 4.02 MIL/uL (ref 3.87–5.11)
RDW: 17.2 % — ABNORMAL HIGH (ref 11.5–15.5)
WBC: 6.4 10*3/uL (ref 4.0–10.5)
nRBC: 0 % (ref 0.0–0.2)

## 2020-05-19 MED ORDER — SODIUM CHLORIDE 0.9 % IV SOLN
Freq: Once | INTRAVENOUS | Status: AC
  Filled 2020-05-19: qty 250

## 2020-05-19 MED ORDER — ZOLEDRONIC ACID 4 MG/5ML IV CONC
3.5000 mg | Freq: Once | INTRAVENOUS | Status: AC
  Administered 2020-05-19: 3.5 mg via INTRAVENOUS
  Filled 2020-05-19: qty 4.38

## 2020-05-19 NOTE — Patient Instructions (Signed)
Zoledronic Acid Injection (Hypercalcemia, Oncology) What is this medicine? ZOLEDRONIC ACID (ZOE le dron ik AS id) slows calcium loss from bones. It high calcium levels in the blood from some kinds of cancer. It may be used in other people at risk for bone loss. This medicine may be used for other purposes; ask your health care provider or pharmacist if you have questions. COMMON BRAND NAME(S): Zometa What should I tell my health care provider before I take this medicine? They need to know if you have any of these conditions:  cancer  dehydration  dental disease  kidney disease  liver disease  low levels of calcium in the blood  lung or breathing disease (asthma)  receiving steroids like dexamethasone or prednisone  an unusual or allergic reaction to zoledronic acid, other medicines, foods, dyes, or preservatives  pregnant or trying to get pregnant  breast-feeding How should I use this medicine? This drug is injected into a vein. It is given by a health care provider in a hospital or clinic setting. Talk to your health care provider about the use of this drug in children. Special care may be needed. Overdosage: If you think you have taken too much of this medicine contact a poison control center or emergency room at once. NOTE: This medicine is only for you. Do not share this medicine with others. What if I miss a dose? Keep appointments for follow-up doses. It is important not to miss your dose. Call your health care provider if you are unable to keep an appointment. What may interact with this medicine?  certain antibiotics given by injection  NSAIDs, medicines for pain and inflammation, like ibuprofen or naproxen  some diuretics like bumetanide, furosemide  teriparatide  thalidomide This list may not describe all possible interactions. Give your health care provider a list of all the medicines, herbs, non-prescription drugs, or dietary supplements you use. Also tell  them if you smoke, drink alcohol, or use illegal drugs. Some items may interact with your medicine. What should I watch for while using this medicine? Visit your health care provider for regular checks on your progress. It may be some time before you see the benefit from this drug. Some people who take this drug have severe bone, joint, or muscle pain. This drug may also increase your risk for jaw problems or a broken thigh bone. Tell your health care provider right away if you have severe pain in your jaw, bones, joints, or muscles. Tell you health care provider if you have any pain that does not go away or that gets worse. Tell your dentist and dental surgeon that you are taking this drug. You should not have major dental surgery while on this drug. See your dentist to have a dental exam and fix any dental problems before starting this drug. Take good care of your teeth while on this drug. Make sure you see your dentist for regular follow-up appointments. You should make sure you get enough calcium and vitamin D while you are taking this drug. Discuss the foods you eat and the vitamins you take with your health care provider. Check with your health care provider if you have severe diarrhea, nausea, and vomiting, or if you sweat a lot. The loss of too much body fluid may make it dangerous for you to take this drug. You may need blood work done while you are taking this drug. Do not become pregnant while taking this drug. Women should inform their health care provider  if they wish to become pregnant or think they might be pregnant. There is potential for serious harm to an unborn child. Talk to your health care provider for more information. What side effects may I notice from receiving this medicine? Side effects that you should report to your doctor or health care provider as soon as possible:  allergic reactions (skin rash, itching or hives; swelling of the face, lips, or tongue)  bone  pain  infection (fever, chills, cough, sore throat, pain or trouble passing urine)  jaw pain, especially after dental work  joint pain  kidney injury (trouble passing urine or change in the amount of urine)  low blood pressure (dizziness; feeling faint or lightheaded, falls; unusually weak or tired)  low calcium levels (fast heartbeat; muscle cramps or pain; pain, tingling, or numbness in the hands or feet; seizures)  low magnesium levels (fast, irregular heartbeat; muscle cramp or pain; muscle weakness; tremors; seizures)  low red blood cell counts (trouble breathing; feeling faint; lightheaded, falls; unusually weak or tired)  muscle pain  redness, blistering, peeling, or loosening of the skin, including inside the mouth  severe diarrhea  swelling of the ankles, feet, hands  trouble breathing Side effects that usually do not require medical attention (report to your doctor or health care provider if they continue or are bothersome):  anxious  constipation  coughing  depressed mood  eye irritation, itching, or pain  fever  general ill feeling or flu-like symptoms  nausea  pain, redness, or irritation at site where injected  trouble sleeping This list may not describe all possible side effects. Call your doctor for medical advice about side effects. You may report side effects to FDA at 1-800-FDA-1088. Where should I keep my medicine? This drug is given in a hospital or clinic. It will not be stored at home. NOTE: This sheet is a summary. It may not cover all possible information. If you have questions about this medicine, talk to your doctor, pharmacist, or health care provider.  2021 Elsevier/Gold Standard (2018-11-02 09:13:00)

## 2020-05-19 NOTE — Progress Notes (Signed)
05/19/20 Ca - 8.7, 03/24/20 (last recorded albumin - 2), CCa 10.3  Raul Del [place], [place], BCPS, BCOP 05/19/2020 3:08 PM

## 2020-05-20 ENCOUNTER — Ambulatory Visit
Admission: RE | Admit: 2020-05-20 | Discharge: 2020-05-20 | Disposition: A | Discharge status: Home / Self Care | Payer: Medicare HMO | Source: Ambulatory Visit | Attending: Radiation Oncology | Admitting: Radiation Oncology

## 2020-05-20 DIAGNOSIS — C50919 Malignant neoplasm of unspecified site of unspecified female breast: Secondary | ICD-10-CM

## 2020-05-20 DIAGNOSIS — C7951 Secondary malignant neoplasm of bone: Secondary | ICD-10-CM | POA: Diagnosis not present

## 2020-05-20 DIAGNOSIS — C50912 Malignant neoplasm of unspecified site of left female breast: Secondary | ICD-10-CM | POA: Diagnosis not present

## 2020-05-20 DIAGNOSIS — Z510 Encounter for antineoplastic radiation therapy: Secondary | ICD-10-CM | POA: Diagnosis not present

## 2020-05-20 DIAGNOSIS — C773 Secondary and unspecified malignant neoplasm of axilla and upper limb lymph nodes: Secondary | ICD-10-CM | POA: Diagnosis not present

## 2020-05-21 ENCOUNTER — Other Ambulatory Visit: Payer: Self-pay | Admitting: Radiation Oncology

## 2020-05-21 ENCOUNTER — Ambulatory Visit
Admission: RE | Admit: 2020-05-21 | Discharge: 2020-05-21 | Disposition: A | Discharge status: Home / Self Care | Payer: Medicare HMO | Source: Ambulatory Visit | Attending: Radiation Oncology | Admitting: Radiation Oncology

## 2020-05-21 ENCOUNTER — Telehealth: Payer: Self-pay | Admitting: Radiology

## 2020-05-21 ENCOUNTER — Other Ambulatory Visit: Payer: Self-pay

## 2020-05-21 DIAGNOSIS — I071 Rheumatic tricuspid insufficiency: Secondary | ICD-10-CM | POA: Diagnosis not present

## 2020-05-21 DIAGNOSIS — I058 Other rheumatic mitral valve diseases: Secondary | ICD-10-CM | POA: Diagnosis not present

## 2020-05-21 DIAGNOSIS — C787 Secondary malignant neoplasm of liver and intrahepatic bile duct: Secondary | ICD-10-CM | POA: Diagnosis not present

## 2020-05-21 DIAGNOSIS — M5431 Sciatica, right side: Secondary | ICD-10-CM | POA: Diagnosis not present

## 2020-05-21 DIAGNOSIS — C50912 Malignant neoplasm of unspecified site of left female breast: Secondary | ICD-10-CM | POA: Diagnosis not present

## 2020-05-21 DIAGNOSIS — M6281 Muscle weakness (generalized): Secondary | ICD-10-CM | POA: Diagnosis not present

## 2020-05-21 DIAGNOSIS — C773 Secondary and unspecified malignant neoplasm of axilla and upper limb lymph nodes: Secondary | ICD-10-CM | POA: Diagnosis not present

## 2020-05-21 DIAGNOSIS — Z9181 History of falling: Secondary | ICD-10-CM | POA: Diagnosis not present

## 2020-05-21 DIAGNOSIS — Z510 Encounter for antineoplastic radiation therapy: Secondary | ICD-10-CM | POA: Diagnosis not present

## 2020-05-21 DIAGNOSIS — C7951 Secondary malignant neoplasm of bone: Secondary | ICD-10-CM

## 2020-05-21 DIAGNOSIS — J42 Unspecified chronic bronchitis: Secondary | ICD-10-CM | POA: Diagnosis not present

## 2020-05-21 DIAGNOSIS — Z79899 Other long term (current) drug therapy: Secondary | ICD-10-CM | POA: Diagnosis not present

## 2020-05-21 DIAGNOSIS — F411 Generalized anxiety disorder: Secondary | ICD-10-CM | POA: Insufficient documentation

## 2020-05-21 DIAGNOSIS — J910 Malignant pleural effusion: Secondary | ICD-10-CM | POA: Diagnosis not present

## 2020-05-21 DIAGNOSIS — C50919 Malignant neoplasm of unspecified site of unspecified female breast: Secondary | ICD-10-CM

## 2020-05-21 MED ORDER — LORAZEPAM 0.5 MG PO TABS: 0.5000 mg | ORAL_TABLET | Freq: Three times a day (TID) | ORAL | 0 refills | Status: DC | PRN

## 2020-05-21 MED ORDER — LORAZEPAM 1 MG PO TABS
0.5000 mg | ORAL_TABLET | Freq: Once | ORAL | Status: AC
Start: 2020-05-21 — End: 2020-05-21
  Administered 2020-05-21: 0.5 mg via ORAL
  Filled 2020-05-21: qty 0.5

## 2020-05-21 NOTE — Telephone Encounter (Signed)
Patient states she refused medication for anxiety when seen yesterday but has become quite anxious and would like a script for the medication.

## 2020-05-22 ENCOUNTER — Ambulatory Visit
Admission: RE | Admit: 2020-05-22 | Discharge: 2020-05-22 | Disposition: A | Discharge status: Home / Self Care | Payer: Medicare HMO | Source: Ambulatory Visit | Attending: Radiation Oncology | Admitting: Radiation Oncology

## 2020-05-22 ENCOUNTER — Ambulatory Visit: Payer: Medicare HMO | Admitting: Radiation Oncology

## 2020-05-22 DIAGNOSIS — C7951 Secondary malignant neoplasm of bone: Secondary | ICD-10-CM | POA: Diagnosis not present

## 2020-05-22 DIAGNOSIS — Z510 Encounter for antineoplastic radiation therapy: Secondary | ICD-10-CM | POA: Diagnosis not present

## 2020-05-22 DIAGNOSIS — C773 Secondary and unspecified malignant neoplasm of axilla and upper limb lymph nodes: Secondary | ICD-10-CM | POA: Diagnosis not present

## 2020-05-22 DIAGNOSIS — C50912 Malignant neoplasm of unspecified site of left female breast: Secondary | ICD-10-CM | POA: Diagnosis not present

## 2020-05-23 ENCOUNTER — Other Ambulatory Visit: Payer: Self-pay

## 2020-05-23 ENCOUNTER — Ambulatory Visit
Admission: RE | Admit: 2020-05-23 | Discharge: 2020-05-23 | Disposition: A | Discharge status: Home / Self Care | Payer: Medicare HMO | Source: Ambulatory Visit | Attending: Radiation Oncology | Admitting: Radiation Oncology

## 2020-05-23 DIAGNOSIS — C7951 Secondary malignant neoplasm of bone: Secondary | ICD-10-CM | POA: Diagnosis not present

## 2020-05-23 DIAGNOSIS — Z510 Encounter for antineoplastic radiation therapy: Secondary | ICD-10-CM | POA: Diagnosis not present

## 2020-05-23 DIAGNOSIS — C50912 Malignant neoplasm of unspecified site of left female breast: Secondary | ICD-10-CM | POA: Diagnosis not present

## 2020-05-23 DIAGNOSIS — C773 Secondary and unspecified malignant neoplasm of axilla and upper limb lymph nodes: Secondary | ICD-10-CM | POA: Diagnosis not present

## 2020-05-26 ENCOUNTER — Encounter (HOSPITAL_COMMUNITY): Payer: Self-pay

## 2020-05-26 ENCOUNTER — Ambulatory Visit
Admission: RE | Admit: 2020-05-26 | Discharge: 2020-05-26 | Disposition: A | Discharge status: Home / Self Care | Payer: Medicare HMO | Source: Ambulatory Visit | Attending: Radiation Oncology | Admitting: Radiation Oncology

## 2020-05-26 ENCOUNTER — Ambulatory Visit (HOSPITAL_COMMUNITY)
Admission: RE | Admit: 2020-05-26 | Discharge: 2020-05-26 | Disposition: A | Discharge status: Home / Self Care | Payer: Medicare HMO | Source: Ambulatory Visit | Attending: Hematology and Oncology | Admitting: Hematology and Oncology

## 2020-05-26 ENCOUNTER — Other Ambulatory Visit: Payer: Self-pay

## 2020-05-26 DIAGNOSIS — C50919 Malignant neoplasm of unspecified site of unspecified female breast: Secondary | ICD-10-CM | POA: Insufficient documentation

## 2020-05-26 DIAGNOSIS — J9811 Atelectasis: Secondary | ICD-10-CM | POA: Diagnosis not present

## 2020-05-26 DIAGNOSIS — C50912 Malignant neoplasm of unspecified site of left female breast: Secondary | ICD-10-CM | POA: Diagnosis not present

## 2020-05-26 DIAGNOSIS — M7989 Other specified soft tissue disorders: Secondary | ICD-10-CM | POA: Diagnosis not present

## 2020-05-26 DIAGNOSIS — E278 Other specified disorders of adrenal gland: Secondary | ICD-10-CM | POA: Diagnosis not present

## 2020-05-26 DIAGNOSIS — C7951 Secondary malignant neoplasm of bone: Secondary | ICD-10-CM

## 2020-05-26 DIAGNOSIS — C773 Secondary and unspecified malignant neoplasm of axilla and upper limb lymph nodes: Secondary | ICD-10-CM | POA: Diagnosis not present

## 2020-05-26 DIAGNOSIS — Z510 Encounter for antineoplastic radiation therapy: Secondary | ICD-10-CM | POA: Diagnosis not present

## 2020-05-26 MED ORDER — IOHEXOL 300 MG/ML  SOLN
100.0000 mL | Freq: Once | INTRAMUSCULAR | Status: AC | PRN
  Administered 2020-05-26: 100 mL via INTRAVENOUS

## 2020-05-27 ENCOUNTER — Ambulatory Visit: Payer: Medicare HMO

## 2020-05-27 ENCOUNTER — Inpatient Hospital Stay: Payer: Medicare HMO

## 2020-05-27 ENCOUNTER — Inpatient Hospital Stay (HOSPITAL_COMMUNITY): Payer: Medicare HMO

## 2020-05-27 ENCOUNTER — Inpatient Hospital Stay (HOSPITAL_COMMUNITY)
Admission: EM | Admit: 2020-05-27 | Discharge: 2020-06-06 | DRG: 189 | Disposition: A | Discharge status: Home Health | Payer: Medicare HMO | Attending: Internal Medicine | Admitting: Internal Medicine

## 2020-05-27 ENCOUNTER — Emergency Department (HOSPITAL_COMMUNITY): Payer: Medicare HMO

## 2020-05-27 ENCOUNTER — Encounter: Payer: Self-pay | Admitting: Hematology and Oncology

## 2020-05-27 ENCOUNTER — Inpatient Hospital Stay: Payer: Medicare HMO | Admitting: Hematology and Oncology

## 2020-05-27 ENCOUNTER — Encounter (HOSPITAL_COMMUNITY): Payer: Self-pay

## 2020-05-27 ENCOUNTER — Other Ambulatory Visit: Payer: Medicare HMO

## 2020-05-27 DIAGNOSIS — I5031 Acute diastolic (congestive) heart failure: Secondary | ICD-10-CM | POA: Diagnosis not present

## 2020-05-27 DIAGNOSIS — R0600 Dyspnea, unspecified: Secondary | ICD-10-CM

## 2020-05-27 DIAGNOSIS — R600 Localized edema: Secondary | ICD-10-CM | POA: Diagnosis present

## 2020-05-27 DIAGNOSIS — Z978 Presence of other specified devices: Secondary | ICD-10-CM | POA: Diagnosis not present

## 2020-05-27 DIAGNOSIS — F411 Generalized anxiety disorder: Secondary | ICD-10-CM | POA: Diagnosis present

## 2020-05-27 DIAGNOSIS — Z79899 Other long term (current) drug therapy: Secondary | ICD-10-CM

## 2020-05-27 DIAGNOSIS — C773 Secondary and unspecified malignant neoplasm of axilla and upper limb lymph nodes: Secondary | ICD-10-CM | POA: Diagnosis not present

## 2020-05-27 DIAGNOSIS — E43 Unspecified severe protein-calorie malnutrition: Secondary | ICD-10-CM | POA: Diagnosis present

## 2020-05-27 DIAGNOSIS — I890 Lymphedema, not elsewhere classified: Secondary | ICD-10-CM | POA: Diagnosis not present

## 2020-05-27 DIAGNOSIS — C799 Secondary malignant neoplasm of unspecified site: Secondary | ICD-10-CM | POA: Diagnosis not present

## 2020-05-27 DIAGNOSIS — R0602 Shortness of breath: Secondary | ICD-10-CM

## 2020-05-27 DIAGNOSIS — C50919 Malignant neoplasm of unspecified site of unspecified female breast: Secondary | ICD-10-CM | POA: Diagnosis not present

## 2020-05-27 DIAGNOSIS — I5033 Acute on chronic diastolic (congestive) heart failure: Secondary | ICD-10-CM | POA: Diagnosis not present

## 2020-05-27 DIAGNOSIS — C50912 Malignant neoplasm of unspecified site of left female breast: Secondary | ICD-10-CM

## 2020-05-27 DIAGNOSIS — R918 Other nonspecific abnormal finding of lung field: Secondary | ICD-10-CM | POA: Diagnosis not present

## 2020-05-27 DIAGNOSIS — Z66 Do not resuscitate: Secondary | ICD-10-CM | POA: Diagnosis present

## 2020-05-27 DIAGNOSIS — I10 Essential (primary) hypertension: Secondary | ICD-10-CM | POA: Diagnosis not present

## 2020-05-27 DIAGNOSIS — Z515 Encounter for palliative care: Secondary | ICD-10-CM

## 2020-05-27 DIAGNOSIS — Z8249 Family history of ischemic heart disease and other diseases of the circulatory system: Secondary | ICD-10-CM

## 2020-05-27 DIAGNOSIS — R059 Cough, unspecified: Secondary | ICD-10-CM | POA: Diagnosis not present

## 2020-05-27 DIAGNOSIS — Z7189 Other specified counseling: Secondary | ICD-10-CM | POA: Diagnosis not present

## 2020-05-27 DIAGNOSIS — J910 Malignant pleural effusion: Secondary | ICD-10-CM | POA: Diagnosis not present

## 2020-05-27 DIAGNOSIS — J90 Pleural effusion, not elsewhere classified: Secondary | ICD-10-CM

## 2020-05-27 DIAGNOSIS — J948 Other specified pleural conditions: Secondary | ICD-10-CM | POA: Diagnosis not present

## 2020-05-27 DIAGNOSIS — R64 Cachexia: Secondary | ICD-10-CM | POA: Diagnosis present

## 2020-05-27 DIAGNOSIS — Z885 Allergy status to narcotic agent status: Secondary | ICD-10-CM | POA: Diagnosis not present

## 2020-05-27 DIAGNOSIS — J9601 Acute respiratory failure with hypoxia: Secondary | ICD-10-CM | POA: Diagnosis not present

## 2020-05-27 DIAGNOSIS — J811 Chronic pulmonary edema: Secondary | ICD-10-CM | POA: Diagnosis not present

## 2020-05-27 DIAGNOSIS — Z681 Body mass index (BMI) 19 or less, adult: Secondary | ICD-10-CM | POA: Diagnosis not present

## 2020-05-27 DIAGNOSIS — C7951 Secondary malignant neoplasm of bone: Secondary | ICD-10-CM | POA: Diagnosis present

## 2020-05-27 DIAGNOSIS — Z20822 Contact with and (suspected) exposure to covid-19: Secondary | ICD-10-CM | POA: Diagnosis not present

## 2020-05-27 DIAGNOSIS — R531 Weakness: Secondary | ICD-10-CM | POA: Diagnosis not present

## 2020-05-27 DIAGNOSIS — J9811 Atelectasis: Secondary | ICD-10-CM | POA: Diagnosis not present

## 2020-05-27 DIAGNOSIS — I517 Cardiomegaly: Secondary | ICD-10-CM | POA: Diagnosis not present

## 2020-05-27 DIAGNOSIS — I509 Heart failure, unspecified: Secondary | ICD-10-CM | POA: Diagnosis not present

## 2020-05-27 LAB — CBC WITH DIFFERENTIAL/PLATELET
Abs Immature Granulocytes: 0.04 10*3/uL (ref 0.00–0.07)
Abs Immature Granulocytes: 0.05 10*3/uL (ref 0.00–0.07)
Basophils Absolute: 0 10*3/uL (ref 0.0–0.1)
Basophils Absolute: 0.1 10*3/uL (ref 0.0–0.1)
Basophils Relative: 0 %
Basophils Relative: 1 %
Eosinophils Absolute: 0 10*3/uL (ref 0.0–0.5)
Eosinophils Absolute: 0 10*3/uL (ref 0.0–0.5)
Eosinophils Relative: 0 %
Eosinophils Relative: 0 %
HCT: 38.1 % (ref 36.0–46.0)
HCT: 39.6 % (ref 36.0–46.0)
Hemoglobin: 12 g/dL (ref 12.0–15.0)
Hemoglobin: 12.3 g/dL (ref 12.0–15.0)
Immature Granulocytes: 1 %
Immature Granulocytes: 1 %
Lymphocytes Relative: 8 %
Lymphocytes Relative: 9 %
Lymphs Abs: 0.7 10*3/uL (ref 0.7–4.0)
Lymphs Abs: 0.9 10*3/uL (ref 0.7–4.0)
MCH: 29.1 pg (ref 26.0–34.0)
MCH: 29.4 pg (ref 26.0–34.0)
MCHC: 31.5 g/dL (ref 30.0–36.0)
MCHC: 31.1 g/dL (ref 30.0–36.0)
MCV: 92.5 fL (ref 80.0–100.0)
MCV: 94.5 fL (ref 80.0–100.0)
Monocytes Absolute: 0.8 10*3/uL (ref 0.1–1.0)
Monocytes Absolute: 1.2 10*3/uL — ABNORMAL HIGH (ref 0.1–1.0)
Monocytes Relative: 9 %
Monocytes Relative: 12 %
Neutro Abs: 7.2 10*3/uL (ref 1.7–7.7)
Neutro Abs: 7.7 10*3/uL (ref 1.7–7.7)
Neutrophils Relative %: 82 %
Neutrophils Relative %: 77 %
Platelets: 326 10*3/uL (ref 150–400)
Platelets: 350 10*3/uL (ref 150–400)
RBC: 4.12 MIL/uL (ref 3.87–5.11)
RBC: 4.19 MIL/uL (ref 3.87–5.11)
RDW: 16.8 % — ABNORMAL HIGH (ref 11.5–15.5)
RDW: 16.8 % — ABNORMAL HIGH (ref 11.5–15.5)
WBC: 8.8 10*3/uL (ref 4.0–10.5)
WBC: 9.9 10*3/uL (ref 4.0–10.5)
nRBC: 0 % (ref 0.0–0.2)
nRBC: 0 % (ref 0.0–0.2)

## 2020-05-27 LAB — LACTATE DEHYDROGENASE, PLEURAL OR PERITONEAL FLUID: LD, Fluid: 90 U/L — ABNORMAL HIGH (ref 3–23)

## 2020-05-27 LAB — BODY FLUID CELL COUNT WITH DIFFERENTIAL
Lymphs, Fluid: 78 %
Monocyte-Macrophage-Serous Fluid: 19 % — ABNORMAL LOW (ref 50–90)
Neutrophil Count, Fluid: 3 % (ref 0–25)
Total Nucleated Cell Count, Fluid: 918 cu mm (ref 0–1000)

## 2020-05-27 LAB — COMPREHENSIVE METABOLIC PANEL
ALT: 31 U/L (ref 0–44)
AST: 37 U/L (ref 15–41)
Albumin: 3.6 g/dL (ref 3.5–5.0)
Alkaline Phosphatase: 360 U/L — ABNORMAL HIGH (ref 38–126)
Anion gap: 9 (ref 5–15)
BUN: 14 mg/dL (ref 8–23)
CO2: 26 mmol/L (ref 22–32)
Calcium: 8.5 mg/dL — ABNORMAL LOW (ref 8.9–10.3)
Chloride: 102 mmol/L (ref 98–111)
Creatinine, Ser: 0.39 mg/dL — ABNORMAL LOW (ref 0.44–1.00)
GFR, Estimated: >60 mL/min (ref >60)
Glucose, Bld: 130 mg/dL — ABNORMAL HIGH (ref 70–99)
Potassium: 3.9 mmol/L (ref 3.5–5.1)
Sodium: 137 mmol/L (ref 135–145)
Total Bilirubin: 0.5 mg/dL (ref 0.3–1.2)
Total Protein: 7.5 g/dL (ref 6.5–8.1)

## 2020-05-27 LAB — TROPONIN I (HIGH SENSITIVITY)
Troponin I (High Sensitivity): 14 ng/L (ref <18)
Troponin I (High Sensitivity): 15 ng/L (ref <18)

## 2020-05-27 LAB — RESP PANEL BY RT-PCR (FLU A&B, COVID) ARPGX2
Influenza A by PCR: NEGATIVE
Influenza B by PCR: NEGATIVE
SARS Coronavirus 2 by RT PCR: NEGATIVE

## 2020-05-27 LAB — GLUCOSE, PLEURAL OR PERITONEAL FLUID: Glucose, Fluid: 159 mg/dL

## 2020-05-27 LAB — BRAIN NATRIURETIC PEPTIDE: B Natriuretic Peptide: 319.2 pg/mL — ABNORMAL HIGH (ref 0.0–100.0)

## 2020-05-27 MED ORDER — LIDOCAINE HCL 1 % IJ SOLN
INTRAMUSCULAR | Status: AC
  Filled 2020-05-27: qty 20

## 2020-05-27 MED ORDER — MORPHINE SULFATE (PF) 2 MG/ML IV SOLN: 2.0000 mg | INTRAVENOUS | Status: DC | PRN

## 2020-05-27 MED ORDER — SENNA 8.6 MG PO TABS: 8.6000 mg | ORAL_TABLET | Freq: Every day | ORAL | Status: DC | PRN

## 2020-05-27 MED ORDER — FUROSEMIDE 10 MG/ML IJ SOLN
40.0000 mg | Freq: Once | INTRAMUSCULAR | Status: AC
  Administered 2020-05-27: 40 mg via INTRAVENOUS
  Filled 2020-05-27: qty 4

## 2020-05-27 MED ORDER — ONDANSETRON HCL 4 MG PO TABS: 4.0000 mg | ORAL_TABLET | Freq: Four times a day (QID) | ORAL | Status: DC | PRN

## 2020-05-27 MED ORDER — FUROSEMIDE 10 MG/ML IJ SOLN
20.0000 mg | Freq: Two times a day (BID) | INTRAMUSCULAR | Status: DC
  Administered 2020-05-28: 20 mg via INTRAVENOUS
  Filled 2020-05-27: qty 2

## 2020-05-27 MED ORDER — ANASTROZOLE 1 MG PO TABS
1.0000 mg | ORAL_TABLET | Freq: Every day | ORAL | Status: DC
  Administered 2020-05-28 – 2020-06-06 (×9): 1 mg via ORAL
  Filled 2020-05-27 (×10): qty 1

## 2020-05-27 MED ORDER — LORAZEPAM 0.5 MG PO TABS
0.5000 mg | ORAL_TABLET | Freq: Three times a day (TID) | ORAL | Status: DC | PRN
  Administered 2020-05-28 – 2020-06-02 (×5): 0.5 mg via ORAL
  Filled 2020-05-27 (×5): qty 1

## 2020-05-27 MED ORDER — ONDANSETRON HCL 4 MG/2ML IJ SOLN: 4.0000 mg | Freq: Four times a day (QID) | INTRAMUSCULAR | Status: DC | PRN

## 2020-05-27 NOTE — Assessment & Plan Note (Signed)
Worsening compared to last imaging with clinical worsening of SOB and hypoxia. She is now on oxygen in clinic, sat improved to 98% on 2 L May benefit from thoracentesis, please send cytology According to imaging and clinically, she is responding to current treatment, so this worsening pleural effusion may or may not be related to breast cancer Will recommend ECHO as well since she is on herceptin, last ECHO was adequate. Will send her to the ED, report given to charge nurse.

## 2020-05-27 NOTE — ED Triage Notes (Signed)
Pt arrives from cancer center for [facility], cough, and O2 sat in the 80s. Normally does not need oxygen. CT scan yesterday shows new pleural effusion. Productive cough with clear sputum. 92% on 2L

## 2020-05-27 NOTE — Procedures (Signed)
PROCEDURE SUMMARY:  Successful image-guided left thoracentesis. Yielded 950 cc of clear yellow fluid. Pt tolerated procedure well. No immediate complications. EBL = trace   Specimen was sent for labs. CXR ordered.  Please see imaging section of Epic for full dictation.  Armando Gang Izadora Roehr PA-C 05/27/2020 4:49 PM

## 2020-05-27 NOTE — Progress Notes (Signed)
Per Dr. Chryl Heck - patient will not be receiving herceptin infusion today. Infusion RN and Agricultural consultant made aware.

## 2020-05-27 NOTE — Assessment & Plan Note (Signed)
BLE stable compared to last visit, LUE swelling remains the same. She may benefit from gentle IV diuresis given the worsening bilateral pleural effusion as well.

## 2020-05-27 NOTE — ED Provider Notes (Signed)
[facility] DEPT Provider Note   CSN: [id number] Arrival date & time: 05/27/20  1422     History Chief Complaint  Patient presents with  . Shortness of Breath    Morgan Yu is a 80 y.o. female hx of breast cancer s/p mets, here presenting with shortness of breath.  Patient had shortness of breath since yesterday.  Patient went to oncology today and had a CT chest abdomen pelvis that showed large bilateral pleural effusions and hepatic metastasis.  Patient was noted to be hypoxic 82% on room air.  Oncology sent her in for admission.  Patient has no fever or cough.  The history is provided by the patient.       Past Medical History:  Diagnosis Date  . Breast cancer [facility])    with bone metastasis    Patient Active Problem List   Diagnosis Date Noted  . Lymphedema 05/27/2020  . Bilateral lower extremity edema 05/27/2020  . Anxiety state 05/21/2020  . Carcinoma of breast metastatic to bone ([place]) 03/19/2020  . Protein-calorie malnutrition, severe 03/04/2020  . Pressure injury of skin 02/28/2020  . Bone metastases ([place]) 02/27/2020  . Elevated LFTs 02/27/2020  . Elevated troponin 02/27/2020  . Pleural effusion 02/27/2020  . Breast mass, left 02/27/2020  . Sinus tachycardia 02/27/2020    History reviewed. No pertinent surgical history.   OB History   No obstetric history on file.     Family History  Problem Relation Age of Onset  . Stroke Mother   . Heart attack Father   . Cancer Brother   . Esophageal cancer Maternal Aunt   . Breast cancer Maternal Aunt     Social History   Tobacco Use  . Smoking status: Never Smoker  . Smokeless tobacco: Never Used  Substance Use Topics  . Alcohol use: Not Currently  . Drug use: Never    Home Medications Prior to Admission medications   Medication Sig Start Date End Date Taking? Authorizing Provider  acetaminophen (TYLENOL) 500 MG tablet Take 500 mg by mouth every 6 (six) hours as  needed for moderate pain.   Yes [provider]  anastrozole (ARIMIDEX) 1 MG tablet TAKE 1 TABLET BY MOUTH EVERY DAY Patient taking differently: Take 1 mg by mouth daily. 05/05/20  Yes Iruku, Arletha Pili, MD  Chlorphen-Pseudoephed-APAP (TYLENOL ALLERGY SINUS PO) Take 1 tablet by mouth daily as needed (allergy).   Yes [provider]  furosemide (LASIX) 20 MG tablet TAKE 1 TABLET BY MOUTH EVERY DAY Patient taking differently: Take 20 mg by mouth daily. 05/06/20  Yes Iruku, Arletha Pili, MD  LORazepam (ATIVAN) 0.5 MG tablet Take 1 tablet (0.5 mg total) by mouth every 8 (eight) hours as needed for anxiety. Take 1 hour prior to radiation therapy Patient taking differently: Take 0.5 mg by mouth every 8 (eight) hours as needed for anxiety. 05/21/20  Yes Gery Pray, MD  oxymetazoline (AFRIN) 0.05 % nasal spray Place 1 spray into both nostrils daily as needed for congestion.   Yes [provider]  senna (SENOKOT) 8.6 MG TABS tablet Take 8.6 mg by mouth daily as needed for mild constipation.   Yes [provider]  feeding supplement (ENSURE ENLIVE / ENSURE PLUS) LIQD Take 237 mLs by mouth 3 (three) times daily between meals. Patient not taking: No sig reported 03/05/20   Antonieta Pert, MD  mirtazapine (REMERON) 15 MG tablet Take 1 tablet (15 mg total) by mouth at bedtime. Patient not taking: Reported on  05/27/2020 03/05/20 04/04/20  Antonieta Pert, MD  ondansetron (ZOFRAN) 4 MG tablet Take 1 tablet (4 mg total) by mouth every 8 (eight) hours as needed for nausea or vomiting. Patient not taking: No sig reported 03/24/20   Harle Stanford., PA-C    Allergies    Vicodin hp [hydrocodone-acetaminophen]  Review of Systems   Review of Systems  Respiratory: Positive for shortness of breath.   All other systems reviewed and are negative.   Physical Exam Updated Vital Signs BP (!) 145/74   Pulse 91   Temp 98.4 F (36.9 C) (Oral)   Resp 19   Ht 5' 1.5" (1.562 m)   Wt 51.5 kg   SpO2 96%    BMI 21.10 kg/m   Physical Exam Vitals and nursing note reviewed.  Constitutional:      Comments: Tachypneic, chronically ill  HENT:     Head: Normocephalic.  Eyes:     Extraocular Movements: Extraocular movements intact.     Pupils: Pupils are equal, round, and reactive to light.  Cardiovascular:     Rate and Rhythm: Normal rate and regular rhythm.  Pulmonary:     Comments: Tachypneic and crackles bilateral bases  Abdominal:     Comments: Distended, + fluid wave  Musculoskeletal:     Cervical back: Normal range of motion and neck supple.     Comments: Lymphedema in the left upper arm that is chronic  Skin:    General: Skin is warm.     Capillary Refill: Capillary refill takes less than 2 seconds.  Neurological:     General: No focal deficit present.     Mental Status: She is oriented to person, place, and time.  Psychiatric:        Mood and Affect: Mood normal.        Behavior: Behavior normal.     ED Results / Procedures / Treatments   Labs (all labs ordered are listed, but only abnormal results are displayed) Labs Reviewed  CBC WITH DIFFERENTIAL/PLATELET - Abnormal; Notable for the following components:      Result Value   RDW 16.8 (*)    Monocytes Absolute 1.2 (*)    All other components within normal limits  COMPREHENSIVE METABOLIC PANEL - Abnormal; Notable for the following components:   Glucose, Bld 130 (*)    Creatinine, Ser 0.39 (*)    Calcium 8.5 (*)    Alkaline Phosphatase 360 (*)    All other components within normal limits  LACTATE DEHYDROGENASE, PLEURAL OR PERITONEAL FLUID - Abnormal; Notable for the following components:   LD, Fluid 90 (*)    All other components within normal limits  RESP PANEL BY RT-PCR (FLU A&B, COVID) ARPGX2  BODY FLUID CULTURE W GRAM STAIN  GLUCOSE, PLEURAL OR PERITONEAL FLUID  BRAIN NATRIURETIC PEPTIDE  BODY FLUID CELL COUNT WITH DIFFERENTIAL  TROPONIN I (HIGH SENSITIVITY)  TROPONIN I (HIGH SENSITIVITY)    EKG EKG  Interpretation  Date/Time:  Tuesday May 27 2020 16:17:05 EDT Ventricular Rate:  94 PR Interval:  158 QRS Duration: 77 QT Interval:  416 QTC Calculation: 521 R Axis:   91 Text Interpretation: Sinus rhythm Anterior infarct, old Prolonged QT interval No significant change since last tracing Confirmed by Wandra Arthurs [phone]) on 05/27/2020 4:20:24 PM   Radiology CT CHEST ABDOMEN PELVIS W CONTRAST  Result Date: 05/27/2020 CLINICAL DATA:  Breast cancer with bone metastasis. Short of breath. Leg swelling. EXAM: CT CHEST, ABDOMEN, AND PELVIS WITH CONTRAST TECHNIQUE: Multidetector  CT imaging of the chest, abdomen and pelvis was performed following the standard protocol during bolus administration of intravenous contrast. CONTRAST:  175mL OMNIPAQUE IOHEXOL 300 MG/ML  SOLN COMPARISON:  CT 02/27/2020 FINDINGS: CT CHEST FINDINGS Cardiovascular: No significant vascular findings. Normal heart size. No pericardial effusion. Mediastinum/Nodes: Enlarged LEFT axillary lymph node measures 16 mm compares to 20 mm. Several peripheral lymph nodes/nodules in the LEFT axilla seen on comparison exam are no longer evident. Asymmetric thickened tissue in the LEFT breast measuring 16 mm compares to 19 mm. Lungs/Pleura: Nodule in the RIGHT upper lobe measuring 8 mm (image 54/series 4) compares to 7 mm. Smaller nodule in the RIGHT upper lobe measuring 4 mm on image 60 compares to 2 mm. There is interval increase in bilateral layering pleural effusions. Interval increase in bibasilar passive atelectasis. RIGHT lower lobe atelectasis greater than RIGHT. Musculoskeletal: Diffuse osseous metastasis again noted. The metastasis is diffusely sclerotic where previously lesions were mixed lytic and sclerotic. Several expansile lesions are noted. Example RIGHT lateral second rib measuring 20 mm compares to 25 mm. CT ABDOMEN AND PELVIS FINDINGS Hepatobiliary: Previously round enhancing lesions within the liver are now represented by  hypoenhancing lesions. Overall volume of the liver is decrease measuring 19.9 cm in axial dimension (image 47/CT series 2) compared to 22 cm similar level on comparison exam. Pancreas: Pancreas is normal. No ductal dilatation. No pancreatic inflammation. Spleen: Normal spleen Adrenals/urinary tract: Thickening of the LEFT adrenal gland to 14 mm compares to 13 mm. RIGHT adrenal gland appears normal. Kidneys ureters and bladder normal. Stomach/Bowel: No bowel obstruction.  No bowel lesion identified Vascular/Lymphatic: Abdominal aorta is normal caliber with atherosclerotic calcification. There is no retroperitoneal or periportal lymphadenopathy. No pelvic lymphadenopathy. Reproductive: Uterus and adnexa unremarkable. Other: Interval increase in intraperitoneal free fluid. Musculoskeletal: As with the bones in the thorax, the previously mixed lytic and sclerotic metastatic disease has been near complete place by diffuse sclerotic pattern. IMPRESSION: 1. Overall there appears to be a positive response to therapy with decrease in size and number of LEFT axillary lymph nodes and LEFT breast mass. Additionally the volume of the liver is decreased suggesting a positive response to diffuse hepatic metastasis. 2. Previously seen diffuse lytic and sclerotic skeletal metastasis have been replaced by uniform bone sclerosis also suggesting a positive response. Several of the expansile lesions are reduced in volume. 3. Similar RIGHT lung pulmonary nodules. 4. Interval increase in large volume pleural effusions with interval increase in bibasilar passive atelectasis. This coincides with patient's shortness of breath. These results will be called to the ordering clinician or representative by the Radiologist Assistant, and communication documented in the PACS or [employer]. Electronically Signed   By: Suzy Bouchard M.D.   On: 05/27/2020 09:33   DG Chest Port 1 View  Result Date: 05/27/2020 CLINICAL DATA:  Shortness of  breath, cough. EXAM: PORTABLE CHEST 1 VIEW COMPARISON:  February 29, 2020. FINDINGS: Stable cardiomediastinal silhouette. Significantly increased bibasilar opacities are noted concerning for mild pleural effusions and associated bibasilar edema or atelectasis. Bony thorax is unremarkable. No pneumothorax is noted. IMPRESSION: Increased bibasilar opacities are noted concerning for worsening pleural effusions and bibasilar edema or atelectasis. Electronically Signed   By: Marijo Conception M.D.   On: 05/27/2020 17:53   US THORACENTESIS ASP PLEURAL SPACE W/IMG GUIDE  Result Date: 05/27/2020 INDICATION: Shortness of breath, left pleural effusion seen on previous CT scan. Request for therapeutic and diagnostic thoracentesis. EXAM: ULTRASOUND GUIDED LEFT THORACENTESIS MEDICATIONS: 1% lidocaine COMPLICATIONS:  None immediate. PROCEDURE: An ultrasound guided thoracentesis was thoroughly discussed with the patient and questions answered. The benefits, risks, alternatives and complications were also discussed. The patient understands and wishes to proceed with the procedure. Written consent was obtained. Ultrasound was performed to localize and mark an adequate pocket of fluid in the left chest. The area was then prepped and draped in the normal sterile fashion. 1% Lidocaine was used for local anesthesia. Under ultrasound guidance a 6 Fr Safe-T-Centesis catheter was introduced. Thoracentesis was performed. The catheter was removed and a dressing applied. FINDINGS: A total of approximately 950 cc of clear yellow fluid was removed. Samples were sent to the laboratory as requested by the clinical team. Post procedure chest x-ray ordered. IMPRESSION: Successful ultrasound guided left thoracentesis yielding 950 cc of pleural fluid. Read by: Durenda Guthrie, PA-C Electronically Signed   By: Markus Daft M.D.   On: 05/27/2020 16:51    Procedures Procedures   CRITICAL CARE Performed by: Wandra Arthurs   Total critical care time:30  minutes  Critical care time was exclusive of separately billable procedures and treating other patients.  Critical care was necessary to treat or prevent imminent or life-threatening deterioration.  Critical care was time spent personally by me on the following activities: development of treatment plan with patient and/or surrogate as well as nursing, discussions with consultants, evaluation of patient's response to treatment, examination of patient, obtaining history from patient or surrogate, ordering and performing treatments and interventions, ordering and review of laboratory studies, ordering and review of radiographic studies, pulse oximetry and re-evaluation of patient's condition.   Medications Ordered in ED Medications  furosemide (LASIX) injection 40 mg (has no administration in time range)  lidocaine (XYLOCAINE) 1 % (with pres) injection (  Given by Other 05/27/20 1649)    ED Course  I have reviewed the triage vital signs and the nursing notes.  Pertinent labs & imaging results that were available during my care of the patient were reviewed by me and considered in my medical decision making (see chart for details).    MDM Rules/Calculators/A&P                         ARVELLA MASSINGALE is a 80 y.o. female here presenting with shortness of breath.  Patient has a history of metastatic breast cancer.  Patient CT today showed large pleural effusions and ascites around the liver.  Patient is hypoxic to 80% on room air.  I called Dr. Anselm Pancoast from IR.  She needs to have COVID test back before she can get thoracentesis.  Plan to get CBC and CMP and troponin and BNP and chest x-ray.  Will likely admission.  6:09 PM They are able to perform a thoracentesis that took a liter.  Afterwards there is still pulmonary edema on the chest x-ray.  Patient is still requiring about 2 to 3 L nasal cannula.  Patient's body fluid analysis is still pending.  At this point, hospitalist will admit for worsening  shortness of breath and hypoxia and pleural effusion.  Given Lasix for diuresis  Final Clinical Impression(s) / ED Diagnoses Final diagnoses:  Pleural effusion, left    Rx / DC Orders ED Discharge Orders    None       Drenda Freeze, MD 05/27/20 [phone]

## 2020-05-27 NOTE — Progress Notes (Signed)
[place] FOLLOW UP NOTE  Patient Care Team: Patient, [name] as PCP - General (General Practice) [name], MD as Consulting Physician (Hematology and Oncology)  CHIEF COMPLAINTS/PURPOSE OF CONSULTATION:  FU on metastatic breast cancer  ASSESSMENT & PLAN:  Carcinoma of breast metastatic to bone [facility]) Given her older age and co-morbidities, we started her on subcutaneous Herceptin, Arimidex and she is here for follow-up. -She started arimidex on 03/11/2020, tolerating it very well so far, no adverse effects reported. - she is tolerating [place] herceptin reasonably well, LN in neck have resolved, breast mass and axillary mass have improved.  Most recent imaging consistent with response. If her repeat ECHO supports good cardiac function, we plant to continue herceptin.   Bilateral lower extremity edema BLE stable compared to last visit, LUE swelling remains the same. She may benefit from gentle IV diuresis given the worsening bilateral pleural effusion as well.   Pleural effusion Worsening compared to last imaging with clinical worsening of SOB and hypoxia. She is now on oxygen in clinic, sat improved to 98% on 2 L May benefit from thoracentesis, please send cytology According to imaging and clinically, she is responding to current treatment, so this worsening pleural effusion may or may not be related to breast cancer Will recommend ECHO as well since she is on herceptin, last ECHO was adequate. Will send her to the ED, report given to charge nurse.  Bone metastases ([place]) Currently on zometa No worsening pain.  No orders of the defined types were placed in this encounter.   HISTORY OF PRESENTING ILLNESS:   Morgan Yu 80 y.o. female is here because of newly diagnosed metastatic breast cancer.  Oncology History  Carcinoma of breast metastatic to bone ([place])  03/19/2020 Initial Diagnosis   Carcinoma of breast metastatic to bone ([place])    03/24/2020 -  Chemotherapy    Patient is on Treatment Plan: BREAST TRASTUZUMAB Q21D      04/15/2020 Cancer Staging   Staging form: Breast, AJCC 8th Edition - Clinical stage from 04/15/2020: Stage IV (cTX, cNX, cM1, GX, ER+, PR-, HER2+) - Signed by [name], MD on 04/15/2020 Stage prefix: Initial diagnosis Histologic grading system: 3 grade system     Morgan Yu is a 80 year old female with no significant past medical history diagnosed with metastatic carcinoma, breast primary, ER positive PR negative and Her 2 amplified by IHC currently on arimidex, herceptin and zometa here for a planned FU   Interim History  She is here for follow-up with her cousin/daughter.  According to Morgan Yu, patient has been feeling short of breath for about a month or less but in the past week has noticed decline.  She attributes this to the start of her radiation.  She tells me that radiation makes her really anxious and when she takes the Ativan, she becomes really drowsy and loopy.  Lower extremity swelling has pretty much remained the same and she is taking p.o. Lasix as prescribed.  Besides the shortness of breath, she feels really tired today.  She is wondering about continuing radiation at this point.  No other bone pains.  No change in bowel habits.  Rest of the pertinent 10 point ROS reviewed and negative.   MEDICAL HISTORY:  Past Medical History:  Diagnosis Date  . Breast cancer ([place])    with bone metastasis    SURGICAL HISTORY: No past surgical history on file.  SOCIAL HISTORY: Social History   Socioeconomic History  .  Marital status: Widowed    Spouse name: Not on file  . Number of children: Not on file  . Years of education: Not on file  . Highest education level: Not on file  Occupational History  . Not on file  Tobacco Use  . Smoking status: Never Smoker  . Smokeless tobacco: Never Used  Substance and Sexual Activity  . Alcohol use: Not Currently  . Drug use: Never  .  Sexual activity: Not on file  Other Topics Concern  . Not on file  Social History Narrative   No biological children   Has 2 stepchildren   Social Determinants of Health   Financial Resource Strain: Not on file  Food Insecurity: Not on file  Transportation Needs: Not on file  Physical Activity: Not on file  Stress: Not on file  Social Connections: Not on file  Intimate Partner Violence: Not on file    FAMILY HISTORY: Family History  Problem Relation Age of Onset  . Stroke Mother   . Heart attack Father   . Cancer Brother   . Esophageal cancer Maternal Aunt   . Breast cancer Maternal Aunt     ALLERGIES:  is allergic to vicodin hp [hydrocodone-acetaminophen].  MEDICATIONS:  Current Outpatient Medications  Medication Sig Dispense Refill  . acetaminophen (TYLENOL) 500 MG tablet Take 500 mg by mouth every 6 (six) hours as needed for moderate pain.    [place] anastrozole (ARIMIDEX) 1 MG tablet TAKE 1 TABLET BY MOUTH EVERY DAY 30 tablet 1  . feeding supplement (ENSURE ENLIVE / ENSURE PLUS) LIQD Take 237 mLs by mouth 3 (three) times daily between meals. (Patient not taking: No sig reported) 237 mL 12  . furosemide (LASIX) 20 MG tablet TAKE 1 TABLET BY MOUTH EVERY DAY 30 tablet 0  . LORazepam (ATIVAN) 0.5 MG tablet Take 1 tablet (0.5 mg total) by mouth every 8 (eight) hours as needed for anxiety. Take 1 hour prior to radiation therapy 30 tablet 0  . mirtazapine (REMERON) 15 MG tablet Take 1 tablet (15 mg total) by mouth at bedtime. 30 tablet 0  . ondansetron (ZOFRAN) 4 MG tablet Take 1 tablet (4 mg total) by mouth every 8 (eight) hours as needed for nausea or vomiting. (Patient not taking: Reported on 05/12/2020) 40 tablet 1  . Probiotic Product (PROBIOTIC PO) Take 1 capsule by mouth daily.     No current facility-administered medications for this visit.    PHYSICAL EXAMINATION:  ECOG PERFORMANCE STATUS: 0 - Asymptomatic  Vitals:   05/27/20 1347 05/27/20 1348  BP: (!) 182/89    Pulse: 100   Resp: (!) 22   Temp: 97.6 F (36.4 C)   SpO2: (!) 89% 98%   Filed Weights   05/27/20 1347  Weight: 113 lb 8 oz (51.5 kg)    GENERAL: alert, resp distress. Needing oxygen SKIN: pallor noted. Lymphatics:  On today's exam, I could not feel any of her cervical lymphadenopathy, axillary lymph nodes are still smaller. BREAST: Left breast with erythema, skin involvement noted, palpable mass in the left breast occupying pretty much the entire breast,  this has significantly improved compared to the last visit. She usually doesn't like her breast examined but I explained this is important for [place] to assess treatment response. Respiratory: Bilateral diminished air entry bases.  Cardiovascular:  Regular rate and rhythm, S1/S2, without murmur, rub or gallop.  There was no pedal edema.   MSK: 2+ BLE,  left upper extremity swelling from axillary lymphadenopathy, stable.  Skin exam was without echymosis, petichae.   Neuro exam was nonfocal. Patient was alert and oriented.  Attention was good.   Language was appropriate.  Mood was normal without depression.  Speech was not pressured.  Thought content was not tangential.    LABORATORY DATA:  I have reviewed the data as listed Lab Results  Component Value Date   WBC 8.8 05/27/2020   HGB 12.0 05/27/2020   HCT 38.1 05/27/2020   MCV 92.5 05/27/2020   PLT 326 05/27/2020     Chemistry      Component Value Date/Time   NA 145 05/19/2020 1353   K 3.2 (L) 05/19/2020 1353   CL 107 05/19/2020 1353   CO2 25 05/19/2020 1353   BUN 17 05/19/2020 1353   CREATININE 0.69 05/19/2020 1353   CREATININE 0.71 03/24/2020 0824      Component Value Date/Time   CALCIUM 8.7 (L) 05/19/2020 1353   ALKPHOS 1,262 (H) 03/24/2020 0824   AST 184 (HH) 03/24/2020 0824   ALT 28 03/24/2020 0824   BILITOT 1.2 03/24/2020 0824       RADIOGRAPHIC STUDIES: I have personally reviewed the radiological images as listed and agreed with the findings in the  report. CT CHEST ABDOMEN PELVIS W CONTRAST  Result Date: 05/27/2020 CLINICAL DATA:  Breast cancer with bone metastasis. Short of breath. Leg swelling. EXAM: CT CHEST, ABDOMEN, AND PELVIS WITH CONTRAST TECHNIQUE: Multidetector CT imaging of the chest, abdomen and pelvis was performed following the standard protocol during bolus administration of intravenous contrast. CONTRAST:  162m OMNIPAQUE IOHEXOL 300 MG/ML  SOLN COMPARISON:  CT 02/27/2020 FINDINGS: CT CHEST FINDINGS Cardiovascular: No significant vascular findings. Normal heart size. No pericardial effusion. Mediastinum/Nodes: Enlarged LEFT axillary lymph node measures 16 mm compares to 20 mm. Several peripheral lymph nodes/nodules in the LEFT axilla seen on comparison exam are no longer evident. Asymmetric thickened tissue in the LEFT breast measuring 16 mm compares to 19 mm. Lungs/Pleura: Nodule in the RIGHT upper lobe measuring 8 mm (image 54/series 4) compares to 7 mm. Smaller nodule in the RIGHT upper lobe measuring 4 mm on image 60 compares to 2 mm. There is interval increase in bilateral layering pleural effusions. Interval increase in bibasilar passive atelectasis. RIGHT lower lobe atelectasis greater than RIGHT. Musculoskeletal: Diffuse osseous metastasis again noted. The metastasis is diffusely sclerotic where previously lesions were mixed lytic and sclerotic. Several expansile lesions are noted. Example RIGHT lateral second rib measuring 20 mm compares to 25 mm. CT ABDOMEN AND PELVIS FINDINGS Hepatobiliary: Previously round enhancing lesions within the liver are now represented by hypoenhancing lesions. Overall volume of the liver is decrease measuring 19.9 cm in axial dimension (image 47/CT series 2) compared to 22 cm similar level on comparison exam. Pancreas: Pancreas is normal. No ductal dilatation. No pancreatic inflammation. Spleen: Normal spleen Adrenals/urinary tract: Thickening of the LEFT adrenal gland to 14 mm compares to 13 mm. RIGHT  adrenal gland appears normal. Kidneys ureters and bladder normal. Stomach/Bowel: No bowel obstruction.  No bowel lesion identified Vascular/Lymphatic: Abdominal aorta is normal caliber with atherosclerotic calcification. There is no retroperitoneal or periportal lymphadenopathy. No pelvic lymphadenopathy. Reproductive: Uterus and adnexa unremarkable. Other: Interval increase in intraperitoneal free fluid. Musculoskeletal: As with the bones in the thorax, the previously mixed lytic and sclerotic metastatic disease has been near complete place by diffuse sclerotic pattern. IMPRESSION: 1. Overall there appears to be a positive response to therapy with decrease in size and number of LEFT axillary lymph nodes and LEFT  breast mass. Additionally the volume of the liver is decreased suggesting a positive response to diffuse hepatic metastasis. 2. Previously seen diffuse lytic and sclerotic skeletal metastasis have been replaced by uniform bone sclerosis also suggesting a positive response. Several of the expansile lesions are reduced in volume. 3. Similar RIGHT lung pulmonary nodules. 4. Interval increase in large volume pleural effusions with interval increase in bibasilar passive atelectasis. This coincides with patient's shortness of breath. These results will be called to the ordering clinician or representative by the Radiologist Assistant, and communication documented in the PACS or [employer]. Electronically Signed   By: Suzy Bouchard M.D.   On: 05/27/2020 09:33   Reviewed recent imaging reports and reviewed images.  All questions were answered. The patient knows to call the clinic with any problems, questions or concerns. I spent 40 minutes in the care of this patient including history, physical, review of records and counseling and coordination of care    [name], MD 05/27/2020 2:16 PM

## 2020-05-27 NOTE — H&P (Signed)
History and Physical   Morgan Yu BSW:[id number] DOB: 12-01-1940 DOA: 05/27/2020  Referring MD/NP/PA: Dr. Shirlyn Goltz  PCP: Patient, [name]   Outpatient Specialists: Dr. Chryl Heck  Patient coming from: Home  Chief Complaint: Shortness of breath  HPI: Morgan Yu is a 80 y.o. female with medical history significant of carcinoma of the breast with metastasis to the bones who is being followed by oncology on Herceptin and Arimidex subcutaneously who also is on Zometa for bone metastasis was seen today by her oncologist as perform a follow-up.  Patient found to have bilateral lower extremity edema as well as bilateral pleural effusion.  Patient was requiring oxygen up to 2 L.  Due to the size of her pleural effusion she was sent over to the ER for thoracentesis.  This was done with about 2 L of fluid removed.  Currently analysis of the fluid has been sent out.  Questionable whether this is malignant effusion or related to cardiac causes.  She has been admitted to work-up for possible cardiac cause of her pleural effusion.  She denied any fever or chills no cough.  No recent infection..  ED Course: Temperature 98.4 blood pressure 182/89 pulse 103 respirate 26 oxygen sat 89% on room air currently 97% on 2 L.  Review of Systems: As per HPI otherwise 10 point review of systems negative.  Pleural fluid from the left lung glucose is 159.  LDH is 90 pathology has been sent.  Protein still pending.  Cultures have also been sent.  Cell wise mostly nucleated cells.  Chest x-ray showed slightly improved aeration of the left lung.  CBC and chemistry largely within normal.  BNP of 319.  Patient will be admitted for further evaluation and treatment.   Past Medical History:  Diagnosis Date  . Breast cancer ([place])    with bone metastasis    History reviewed. No pertinent surgical history.   reports that she has never smoked. She has never used smokeless tobacco. She reports previous alcohol  use. She reports that she does not use drugs.  Allergies  Allergen Reactions  . Vicodin Hp [Hydrocodone-Acetaminophen]     Brain fog    Family History  Problem Relation Age of Onset  . Stroke Mother   . Heart attack Father   . Cancer Brother   . Esophageal cancer Maternal Aunt   . Breast cancer Maternal Aunt      Prior to Admission medications   Medication Sig Start Date End Date Taking? Authorizing Provider  acetaminophen (TYLENOL) 500 MG tablet Take 500 mg by mouth every 6 (six) hours as needed for moderate pain.   Yes [provider]  anastrozole (ARIMIDEX) 1 MG tablet TAKE 1 TABLET BY MOUTH EVERY DAY Patient taking differently: Take 1 mg by mouth daily. 05/05/20  Yes Iruku, Arletha Pili, MD  Chlorphen-Pseudoephed-APAP (TYLENOL ALLERGY SINUS PO) Take 1 tablet by mouth daily as needed (allergy).   Yes [provider]  furosemide (LASIX) 20 MG tablet TAKE 1 TABLET BY MOUTH EVERY DAY Patient taking differently: Take 20 mg by mouth daily. 05/06/20  Yes Iruku, Arletha Pili, MD  LORazepam (ATIVAN) 0.5 MG tablet Take 1 tablet (0.5 mg total) by mouth every 8 (eight) hours as needed for anxiety. Take 1 hour prior to radiation therapy Patient taking differently: Take 0.5 mg by mouth every 8 (eight) hours as needed for anxiety. 05/21/20  Yes Gery Pray, MD  oxymetazoline (AFRIN) 0.05 % nasal spray Place 1 spray into both  nostrils daily as needed for congestion.   Yes [provider]  senna (SENOKOT) 8.6 MG TABS tablet Take 8.6 mg by mouth daily as needed for mild constipation.   Yes [provider]  feeding supplement (ENSURE ENLIVE / ENSURE PLUS) LIQD Take 237 mLs by mouth 3 (three) times daily between meals. Patient not taking: No sig reported 03/05/20   Antonieta Pert, MD  mirtazapine (REMERON) 15 MG tablet Take 1 tablet (15 mg total) by mouth at bedtime. Patient not taking: Reported on 05/27/2020 03/05/20 04/04/20  Antonieta Pert, MD  ondansetron (ZOFRAN) 4 MG tablet Take 1  tablet (4 mg total) by mouth every 8 (eight) hours as needed for nausea or vomiting. Patient not taking: No sig reported 03/24/20   Harle Stanford., PA-C    Physical Exam: Vitals:   05/27/20 1715 05/27/20 1730 05/27/20 1745 05/27/20 1800  BP: 110/60 128/66 (!) 120/58 (!) 145/74  Pulse: 93 85 81 91  Resp: (!) 22 20 17 19   Temp:      TempSrc:      SpO2: 92% 97%  96%  Weight:      Height:          Constitutional: Pleasant, chronically ill looking, cachectic Vitals:   05/27/20 1715 05/27/20 1730 05/27/20 1745 05/27/20 1800  BP: 110/60 128/66 (!) 120/58 (!) 145/74  Pulse: 93 85 81 91  Resp: (!) 22 20 17 19   Temp:      TempSrc:      SpO2: 92% 97%  96%  Weight:      Height:       Eyes: PERRL, lids and conjunctivae normal ENMT: Mucous membranes are moist. Posterior pharynx clear of any exudate or lesions.Normal dentition.  Neck: normal, supple, no masses, no thyromegaly Respiratory: Decreased air entry bilaterally with egophony, dull to percussion, no wheeze but has some rales. No accessory muscle use.  Cardiovascular: Regular rate and rhythm, no murmurs / rubs / gallops. No extremity edema. 2+ pedal pulses. No carotid bruits.  Abdomen: no tenderness, no masses palpated. No hepatosplenomegaly. Bowel sounds positive.  Musculoskeletal: no clubbing / cyanosis. No joint deformity upper and lower extremities. Good ROM, no contractures. Normal muscle tone.  Skin: no rashes, lesions, ulcers. No induration Neurologic: CN 2-12 grossly intact. Sensation intact, DTR normal. Strength 5/5 in all 4.  Psychiatric: Normal judgment and insight. Alert and oriented x 3. Normal mood.     Labs on Admission: I have personally reviewed following labs and imaging studies  CBC: Recent Labs  Lab 05/27/20 1321 05/27/20 1645  WBC 8.8 9.9  NEUTROABS 7.2 7.7  HGB 12.0 12.3  HCT 38.1 39.6  MCV 92.5 94.5  PLT 326 086   Basic Metabolic Panel: Recent Labs  Lab 05/27/20 1645  NA 137  K 3.9  CL 102   CO2 26  GLUCOSE 130*  BUN 14  CREATININE 0.39*  CALCIUM 8.5*   GFR: Estimated Creatinine Clearance: 43.4 mL/min (A) (by C-G formula based on SCr of 0.39 mg/dL (L)). Liver Function Tests: Recent Labs  Lab 05/27/20 1645  AST 37  ALT 31  ALKPHOS 360*  BILITOT 0.5  PROT 7.5  ALBUMIN 3.6   No results for input(s): LIPASE, AMYLASE in the last 168 hours. No results for input(s): AMMONIA in the last 168 hours. Coagulation Profile: No results for input(s): INR, PROTIME in the last 168 hours. Cardiac Enzymes: No results for input(s): CKTOTAL, CKMB, CKMBINDEX, TROPONINI in the last 168 hours. BNP (last 3 results) No  results for input(s): PROBNP in the last 8760 hours. HbA1C: No results for input(s): HGBA1C in the last 72 hours. CBG: No results for input(s): GLUCAP in the last 168 hours. Lipid Profile: No results for input(s): CHOL, HDL, LDLCALC, TRIG, CHOLHDL, LDLDIRECT in the last 72 hours. Thyroid Function Tests: No results for input(s): TSH, T4TOTAL, FREET4, T3FREE, THYROIDAB in the last 72 hours. Anemia Panel: No results for input(s): VITAMINB12, FOLATE, FERRITIN, TIBC, IRON, RETICCTPCT in the last 72 hours. Urine analysis: No results found for: COLORURINE, APPEARANCEUR, LABSPEC, PHURINE, GLUCOSEU, HGBUR, BILIRUBINUR, KETONESUR, PROTEINUR, UROBILINOGEN, NITRITE, LEUKOCYTESUR Sepsis Labs: @LABRCNTIP (procalcitonin:4,lacticidven:4) ) Recent Results (from the past 240 hour(s))  Resp Panel by RT-PCR (Flu A&B, Covid) Nasopharyngeal Swab     Status: None   Collection Time: 05/27/20  4:17 PM   Specimen: Nasopharyngeal Swab; Nasopharyngeal(NP) swabs in vial transport medium  Result Value Ref Range Status   SARS Coronavirus 2 by RT PCR NEGATIVE NEGATIVE Final    Comment: (NOTE) SARS-CoV-2 target nucleic acids are NOT DETECTED.  The SARS-CoV-2 RNA is generally detectable in upper respiratory specimens during the acute phase of infection. The lowest concentration of SARS-CoV-2  viral copies this assay can detect is 138 copies/mL. A negative result does not preclude SARS-Cov-2 infection and should not be used as the sole basis for treatment or other patient management decisions. A negative result may occur with  improper specimen collection/handling, submission of specimen other than nasopharyngeal swab, presence of viral mutation(s) within the areas targeted by this assay, and inadequate number of viral copies(<138 copies/mL). A negative result must be combined with clinical observations, patient history, and epidemiological information. The expected result is Negative.  Fact Sheet for Patients:  EntrepreneurPulse.com.au  Fact Sheet for Healthcare Providers:  IncredibleEmployment.be  This test is no t yet approved or cleared by the [place] FDA and  has been authorized for detection and/or diagnosis of SARS-CoV-2 by FDA under an Emergency Use Authorization (EUA). This EUA will remain  in effect (meaning this test can be used) for the duration of the COVID-19 declaration under Section 564(b)(1) of the Act, 21 U.S.C.section 360bbb-3(b)(1), unless the authorization is terminated  or revoked sooner.       Influenza A by PCR NEGATIVE NEGATIVE Final   Influenza B by PCR NEGATIVE NEGATIVE Final    Comment: (NOTE) The Xpert Xpress SARS-CoV-2/FLU/RSV plus assay is intended as an aid in the diagnosis of influenza from Nasopharyngeal swab specimens and should not be used as a sole basis for treatment. Nasal washings and aspirates are unacceptable for Xpert Xpress SARS-CoV-2/FLU/RSV testing.  Fact Sheet for Patients: EntrepreneurPulse.com.au  Fact Sheet for Healthcare Providers: IncredibleEmployment.be  This test is not yet approved or cleared by the [place] FDA and has been authorized for detection and/or diagnosis of SARS-CoV-2 by FDA under an Emergency Use Authorization  (EUA). This EUA will remain in effect (meaning this test can be used) for the duration of the COVID-19 declaration under Section 564(b)(1) of the Act, 21 U.S.C. section 360bbb-3(b)(1), unless the authorization is terminated or revoked.  Performed at [facility], [place] [address], [place], [place] [address]      Radiological Exams on Admission: CT CHEST ABDOMEN PELVIS W CONTRAST  Result Date: 05/27/2020 CLINICAL DATA:  Breast cancer with bone metastasis. Short of breath. Leg swelling. EXAM: CT CHEST, ABDOMEN, AND PELVIS WITH CONTRAST TECHNIQUE: Multidetector CT imaging of the chest, abdomen and pelvis was performed following the standard protocol during bolus administration of intravenous contrast. CONTRAST:  118mL OMNIPAQUE IOHEXOL 300 MG/ML  SOLN COMPARISON:  CT 02/27/2020 FINDINGS: CT CHEST FINDINGS Cardiovascular: No significant vascular findings. Normal heart size. No pericardial effusion. Mediastinum/Nodes: Enlarged LEFT axillary lymph node measures 16 mm compares to 20 mm. Several peripheral lymph nodes/nodules in the LEFT axilla seen on comparison exam are no longer evident. Asymmetric thickened tissue in the LEFT breast measuring 16 mm compares to 19 mm. Lungs/Pleura: Nodule in the RIGHT upper lobe measuring 8 mm (image 54/series 4) compares to 7 mm. Smaller nodule in the RIGHT upper lobe measuring 4 mm on image 60 compares to 2 mm. There is interval increase in bilateral layering pleural effusions. Interval increase in bibasilar passive atelectasis. RIGHT lower lobe atelectasis greater than RIGHT. Musculoskeletal: Diffuse osseous metastasis again noted. The metastasis is diffusely sclerotic where previously lesions were mixed lytic and sclerotic. Several expansile lesions are noted. Example RIGHT lateral second rib measuring 20 mm compares to 25 mm. CT ABDOMEN AND PELVIS FINDINGS Hepatobiliary: Previously round enhancing lesions within the liver are now represented by  hypoenhancing lesions. Overall volume of the liver is decrease measuring 19.9 cm in axial dimension (image 47/CT series 2) compared to 22 cm similar level on comparison exam. Pancreas: Pancreas is normal. No ductal dilatation. No pancreatic inflammation. Spleen: Normal spleen Adrenals/urinary tract: Thickening of the LEFT adrenal gland to 14 mm compares to 13 mm. RIGHT adrenal gland appears normal. Kidneys ureters and bladder normal. Stomach/Bowel: No bowel obstruction.  No bowel lesion identified Vascular/Lymphatic: Abdominal aorta is normal caliber with atherosclerotic calcification. There is no retroperitoneal or periportal lymphadenopathy. No pelvic lymphadenopathy. Reproductive: Uterus and adnexa unremarkable. Other: Interval increase in intraperitoneal free fluid. Musculoskeletal: As with the bones in the thorax, the previously mixed lytic and sclerotic metastatic disease has been near complete place by diffuse sclerotic pattern. IMPRESSION: 1. Overall there appears to be a positive response to therapy with decrease in size and number of LEFT axillary lymph nodes and LEFT breast mass. Additionally the volume of the liver is decreased suggesting a positive response to diffuse hepatic metastasis. 2. Previously seen diffuse lytic and sclerotic skeletal metastasis have been replaced by uniform bone sclerosis also suggesting a positive response. Several of the expansile lesions are reduced in volume. 3. Similar RIGHT lung pulmonary nodules. 4. Interval increase in large volume pleural effusions with interval increase in bibasilar passive atelectasis. This coincides with patient's shortness of breath. These results will be called to the ordering clinician or representative by the Radiologist Assistant, and communication documented in the PACS or [employer]. Electronically Signed   By: Suzy Bouchard M.D.   On: 05/27/2020 09:33   DG Chest Port 1 View  Result Date: 05/27/2020 CLINICAL DATA:  Shortness of  breath, cough. EXAM: PORTABLE CHEST 1 VIEW COMPARISON:  February 29, 2020. FINDINGS: Stable cardiomediastinal silhouette. Significantly increased bibasilar opacities are noted concerning for mild pleural effusions and associated bibasilar edema or atelectasis. Bony thorax is unremarkable. No pneumothorax is noted. IMPRESSION: Increased bibasilar opacities are noted concerning for worsening pleural effusions and bibasilar edema or atelectasis. Electronically Signed   By: Marijo Conception M.D.   On: 05/27/2020 17:53   US THORACENTESIS ASP PLEURAL SPACE W/IMG GUIDE  Result Date: 05/27/2020 INDICATION: Shortness of breath, left pleural effusion seen on previous CT scan. Request for therapeutic and diagnostic thoracentesis. EXAM: ULTRASOUND GUIDED LEFT THORACENTESIS MEDICATIONS: 1% lidocaine COMPLICATIONS: None immediate. PROCEDURE: An ultrasound guided thoracentesis was thoroughly discussed with the patient and questions answered. The benefits, risks, alternatives and complications  were also discussed. The patient understands and wishes to proceed with the procedure. Written consent was obtained. Ultrasound was performed to localize and mark an adequate pocket of fluid in the left chest. The area was then prepped and draped in the normal sterile fashion. 1% Lidocaine was used for local anesthesia. Under ultrasound guidance a 6 Fr Safe-T-Centesis catheter was introduced. Thoracentesis was performed. The catheter was removed and a dressing applied. FINDINGS: A total of approximately 950 cc of clear yellow fluid was removed. Samples were sent to the laboratory as requested by the clinical team. Post procedure chest x-ray ordered. IMPRESSION: Successful ultrasound guided left thoracentesis yielding 950 cc of pleural fluid. Read by: Durenda Guthrie, PA-C Electronically Signed   By: Markus Daft M.D.   On: 05/27/2020 16:51    EKG: Independently reviewed.  It shows sinus rhythm with a rate of 94, mildly QTC  prolongation  Assessment/Plan Principal Problem:   Malignant pleural effusion Active Problems:   Bone metastases (HCC)   Carcinoma of breast metastatic to bone [facility])   Anxiety state   Bilateral lower extremity edema     #1 bilateral pleural effusion: Suspected malignant effusion.  Could also be CHF.  Patient will be admitted.  Empiric diuretics.  Get echocardiogram.  If abnormal will consider cardiology consultation.  #2  Metastatic breast cancer: Continue Follow-up oncology.  Continue her chemotherapeutic agent.  #3 anxiety disorder: Continue home regimen.  #4 bilateral lower extremity edema: Probably secondary to cardiac disease.  Could be CHF or medication related.   DVT prophylaxis: SCD Code Status: DNR Family Communication: Daughter at bedside Disposition Plan: To be determined Consults called: Dr. Chryl Heck, oncology Admission status: Inpatient  Severity of Illness: The appropriate patient status for this patient is INPATIENT. Inpatient status is judged to be reasonable and necessary in order to provide the required intensity of service to ensure the patient's safety. The patient's presenting symptoms, physical exam findings, and initial radiographic and laboratory data in the context of their chronic comorbidities is felt to place them at high risk for further clinical deterioration. Furthermore, it is not anticipated that the patient will be medically stable for discharge from the hospital within 2 midnights of admission. The following factors support the patient status of inpatient.   " The patient's presenting symptoms include shortness of breath and lower extremity edema. " The worrisome physical exam findings include cachexia. " The initial radiographic and laboratory data are worrisome because of bilateral pleural effusion. " The chronic co-morbidities include metastatic breast cancer.   * I certify that at the point of admission it is my clinical judgment that the  patient will require inpatient hospital care spanning beyond 2 midnights from the point of admission due to high intensity of service, high risk for further deterioration and high frequency of surveillance required.Barbette Merino MD [facility] Pager [phone]  If 7PM-7AM, please contact night-coverage [email or web address] Password [facility]  05/27/2020, 7:15 PM

## 2020-05-27 NOTE — Progress Notes (Signed)
Per Dr Chryl Heck, patient transferred to [facility] ED for further management of pleural effusions, SOB, and new onset cough. Patient transferred by RN via wheelchair on 2L nasal cannula. Family present at bedside.

## 2020-05-27 NOTE — Assessment & Plan Note (Signed)
Given her older age and co-morbidities, we started her on subcutaneous Herceptin, Arimidex and she is here for follow-up. -She started arimidex on 03/11/2020, tolerating it very well so far, no adverse effects reported. - she is tolerating [place] herceptin reasonably well, LN in neck have resolved, breast mass and axillary mass have improved.  Most recent imaging consistent with response. If her repeat ECHO supports good cardiac function, we plant to continue herceptin.

## 2020-05-27 NOTE — ED Notes (Signed)
Ultrasound at bedside

## 2020-05-27 NOTE — Assessment & Plan Note (Signed)
Currently on zometa No worsening pain.

## 2020-05-28 ENCOUNTER — Ambulatory Visit
Admission: RE | Admit: 2020-05-28 | Discharge: 2020-05-28 | Disposition: A | Discharge status: Home / Self Care | Payer: Medicare HMO | Source: Ambulatory Visit | Attending: Radiation Oncology | Admitting: Radiation Oncology

## 2020-05-28 ENCOUNTER — Telehealth: Payer: Self-pay | Admitting: Hematology and Oncology

## 2020-05-28 ENCOUNTER — Inpatient Hospital Stay (HOSPITAL_COMMUNITY): Payer: Medicare HMO

## 2020-05-28 ENCOUNTER — Other Ambulatory Visit: Payer: Self-pay

## 2020-05-28 DIAGNOSIS — J9601 Acute respiratory failure with hypoxia: Secondary | ICD-10-CM | POA: Diagnosis not present

## 2020-05-28 DIAGNOSIS — I5031 Acute diastolic (congestive) heart failure: Secondary | ICD-10-CM

## 2020-05-28 DIAGNOSIS — J90 Pleural effusion, not elsewhere classified: Secondary | ICD-10-CM | POA: Diagnosis not present

## 2020-05-28 DIAGNOSIS — C7951 Secondary malignant neoplasm of bone: Secondary | ICD-10-CM | POA: Diagnosis not present

## 2020-05-28 DIAGNOSIS — C50912 Malignant neoplasm of unspecified site of left female breast: Secondary | ICD-10-CM | POA: Diagnosis not present

## 2020-05-28 LAB — COMPREHENSIVE METABOLIC PANEL
ALT: 21 U/L (ref 0–44)
AST: 27 U/L (ref 15–41)
Albumin: 2.8 g/dL — ABNORMAL LOW (ref 3.5–5.0)
Alkaline Phosphatase: 286 U/L — ABNORMAL HIGH (ref 38–126)
Anion gap: 10 (ref 5–15)
BUN: 19 mg/dL (ref 8–23)
CO2: 25 mmol/L (ref 22–32)
Calcium: 7.4 mg/dL — ABNORMAL LOW (ref 8.9–10.3)
Chloride: 106 mmol/L (ref 98–111)
Creatinine, Ser: 0.4 mg/dL — ABNORMAL LOW (ref 0.44–1.00)
GFR, Estimated: >60 mL/min (ref >60)
Glucose, Bld: 174 mg/dL — ABNORMAL HIGH (ref 70–99)
Potassium: 3.9 mmol/L (ref 3.5–5.1)
Sodium: 141 mmol/L (ref 135–145)
Total Bilirubin: 0.2 mg/dL — ABNORMAL LOW (ref 0.3–1.2)
Total Protein: 5.6 g/dL — ABNORMAL LOW (ref 6.5–8.1)

## 2020-05-28 LAB — CBC
HCT: 35 % — ABNORMAL LOW (ref 36.0–46.0)
Hemoglobin: 10.7 g/dL — ABNORMAL LOW (ref 12.0–15.0)
MCH: 28.8 pg (ref 26.0–34.0)
MCHC: 30.6 g/dL (ref 30.0–36.0)
MCV: 94.1 fL (ref 80.0–100.0)
Platelets: 280 10*3/uL (ref 150–400)
RBC: 3.72 MIL/uL — ABNORMAL LOW (ref 3.87–5.11)
RDW: 16.7 % — ABNORMAL HIGH (ref 11.5–15.5)
WBC: 7.9 10*3/uL (ref 4.0–10.5)
nRBC: 0 % (ref 0.0–0.2)

## 2020-05-28 LAB — ECHOCARDIOGRAM COMPLETE
Area-P 1/2: 3.27 cm2
Height: 61 in
S' Lateral: 2.5 cm
Weight: 1707.24 oz

## 2020-05-28 LAB — PATHOLOGIST SMEAR REVIEW

## 2020-05-28 MED ORDER — FUROSEMIDE 20 MG PO TABS
20.0000 mg | ORAL_TABLET | Freq: Every day | ORAL | Status: DC
  Administered 2020-05-29 – 2020-05-31 (×3): 20 mg via ORAL
  Filled 2020-05-28 (×3): qty 1

## 2020-05-28 MED ORDER — SALINE SPRAY 0.65 % NA SOLN
1.0000 | NASAL | Status: DC | PRN
  Administered 2020-05-28: 1 via NASAL
  Filled 2020-05-28: qty 44

## 2020-05-28 NOTE — Telephone Encounter (Signed)
Scheduled appt per 4/26 sch msg. Called pt, no answer. Left msg with appt date and time.

## 2020-05-28 NOTE — Progress Notes (Signed)
  Echocardiogram 2D Echocardiogram has been performed.  Jennette Dubin 05/28/2020, 11:55 AM

## 2020-05-28 NOTE — Progress Notes (Signed)
PROGRESS NOTE  Morgan Yu OMB:[id number] DOB: [birth year] DOA: 05/27/2020 PCP: Patient, [name]  Brief History   82yow PMH metastatic breast cancer followed by oncology, on Herceptin and Arimidex, also proceeding w/ palliative radiation, presented w/ bilateral pleural effusion and acute hypoxic resp failure  A & P  Acute hypox resp failure secondary to pleural effusion --s/p thoracentesis 4/26 w/ removal 953mL clear fluid --wean oxygen as tolerated, may need home oxygen --check CXR in AM to assess for reaccumulation -- Suspected to be malignant in nature however studies thus far unrevealing.  Metastatic breast cancer -- Follow-up with Dr. Chryl Heck as an outpatient  Chronic bilateral LE edema --Mild.  Probably related to malignancy.  2D echocardiogram normal LVEF.  No wall motion abnormalities.  Left ventricular diastolic parameters indeterminate.  Anxiety  --stable  Disposition Plan:  Discussion: Improved status postthoracentesis.  Check chest x-ray in a.m., if stable, discharge home.  Status is: Inpatient  Remains inpatient appropriate because:Inpatient level of care appropriate due to severity of illness   Dispo: The patient is from: Home              Anticipated d/c is to: Home              Patient currently is not medically stable to d/c.   Difficult to place patient Yes  DVT prophylaxis: SCDs Start: 05/27/20 1919   Code Status: DNR Level of care: Med-Surg Family Communication: none  [name], MD  [facility] Direct contact: see [email or web address] (further directions at bottom of note if needed) 7PM-7AM contact night coverage as at bottom of note 05/28/2020, 4:28 PM  LOS: 1 day   Interval History/Subjective  CC: f/u SOB  Feels a lot better, breathing better, was able to lie flat Reports her oncologist told her that her LE edema was from the cancer  Objective   Vitals:  Vitals:   05/28/20 1015 05/28/20 1318  BP:  139/65  Pulse:  90   Resp:  20  Temp:  (!) 97.5 F (36.4 C)  SpO2: 94% 100%    Exam:  Constitutional:   . Appears calm and comfortable sitting in a chair ENMT:  . grossly normal hearing  Respiratory:  . CTA bilaterally, no w/r/r.  . Respiratory effort normal.  Cardiovascular:  . RRR, no m/r/g . 1+ bilateral ankle edema   Psychiatric:  . Mental status o Mood, affect appropriate  I have personally reviewed the following:   Today's Data  . CMp noted . Hgb stable 10.7  Scheduled Meds: . anastrozole  1 mg Oral Daily  . [START ON 05/29/2020] furosemide  20 mg Oral Daily   Continuous Infusions:  Principal Problem:   Acute hypoxemic respiratory failure (HCC) Active Problems:   Bone metastases (HCC)   Pleural effusion   Carcinoma of breast metastatic to bone (HCC)   Anxiety state   Bilateral lower extremity edema   LOS: 1 day   How to contact the [facility] Attending or Consulting provider [place] or covering provider during after hours [place], for this patient?  1. Check the care team in [facility] and look for a) attending/consulting [facility] provider listed and b) the [facility] team listed 2. Log into [email or web address] and use [place] universal password to access. If you do not have the password, please contact the hospital operator. 3. Locate the [facility] provider you are looking for under [facility] and page to a number that you can be directly reached. 4. If  you still have difficulty reaching the provider, please page the [facility] (Director on Call) for the Hospitalists listed on amion for assistance.

## 2020-05-28 NOTE — Plan of Care (Signed)
Initiate general inpatient care plan

## 2020-05-28 NOTE — Hospital Course (Addendum)
1yow PMH metastatic breast cancer followed by oncology, on Herceptin and Arimidex, also proceeding w/ palliative radiation, presented w/ bilateral pleural effusion and acute hypoxic resp failure.  Underwent thoracentesis on the left with nearly 1 L fluid removed.  Cytology unrevealing.  Remained hypoxic, repeat imaging demonstrated bilateral pleural effusions, status post right thoracentesis nearly 1 L fluid removed.  Remained hypoxic.  Repeat chest x-ray 4/29 showed reaccumulation of fluid.  Seen by oncology and palliative medicine.  5/3 with increased oxygen requirement up to 5 L.  Status post left Pleurx catheter placement 5/3.  Followed by oncology, palliative medicine, radiation oncology will see today.  Patient plans to go home, follow-up recommendations from specialists.  If oxygen requirement stable tomorrow, could likely go home.  A & P  Acute hypox resp failure secondary to bilateral pleural effusion --s/p right thoracentesis 4/26 w/ removal 964mL clear fluid.  Cytology unrevealing. S/p left thoracentesis 4/28 removal 900 mL clear fluid.  Cytology not sent. -- Repeat chest x-ray 4/29 demonstrated reaccumulation of fluid. --Although unproven, presumed malignant in nature. -- Remains symptomatic with increased oxygen requirement.  Status post Pleurx 5/3.  If oxygen requirement improves/stabilizes, can likely go home in the next 1 to 2 days.  Metastatic breast cancer -- Followed by oncology, plans to continue current treatment and follow-up as an outpatient.  Chronic bilateral LE edema -- Presumably secondary to malignancy.  2D echocardiogram normal LVEF.  No wall motion abnormalities.  Left ventricular diastolic parameters indeterminate.  Responded well to Lasix.  Anxiety  --driven by dyspnea

## 2020-05-29 ENCOUNTER — Ambulatory Visit
Admission: RE | Admit: 2020-05-29 | Discharge: 2020-05-29 | Disposition: A | Discharge status: Home / Self Care | Payer: Medicare HMO | Source: Ambulatory Visit | Attending: Radiation Oncology | Admitting: Radiation Oncology

## 2020-05-29 ENCOUNTER — Inpatient Hospital Stay (HOSPITAL_COMMUNITY): Payer: Medicare HMO

## 2020-05-29 DIAGNOSIS — J9601 Acute respiratory failure with hypoxia: Secondary | ICD-10-CM | POA: Diagnosis not present

## 2020-05-29 DIAGNOSIS — R0602 Shortness of breath: Secondary | ICD-10-CM

## 2020-05-29 DIAGNOSIS — R600 Localized edema: Secondary | ICD-10-CM | POA: Diagnosis not present

## 2020-05-29 DIAGNOSIS — J90 Pleural effusion, not elsewhere classified: Secondary | ICD-10-CM | POA: Diagnosis not present

## 2020-05-29 MED ORDER — GUAIFENESIN ER 600 MG PO TB12
600.0000 mg | ORAL_TABLET | Freq: Two times a day (BID) | ORAL | Status: DC
  Administered 2020-05-29 – 2020-06-06 (×15): 600 mg via ORAL
  Filled 2020-05-29 (×15): qty 1

## 2020-05-29 MED ORDER — ADULT MULTIVITAMIN W/MINERALS CH
1.0000 | ORAL_TABLET | Freq: Every day | ORAL | Status: DC
  Administered 2020-05-29 – 2020-06-06 (×8): 1 via ORAL
  Filled 2020-05-29 (×8): qty 1

## 2020-05-29 MED ORDER — LIDOCAINE HCL 1 % IJ SOLN
INTRAMUSCULAR | Status: AC
  Filled 2020-05-29: qty 20

## 2020-05-29 NOTE — Plan of Care (Signed)
  Problem: Clinical Measurements: Goal: Will remain free from infection Outcome: Progressing Goal: Diagnostic test results will improve Outcome: Progressing   Problem: Activity: Goal: Risk for activity intolerance will decrease Outcome: Progressing   

## 2020-05-29 NOTE — Progress Notes (Signed)
Initial Nutrition Assessment  DOCUMENTATION CODES:  Severe malnutrition in context of chronic illness  INTERVENTION:  Continue current diet order.  Add snacks TID.  Add Magic cup TID with meals, each supplement provides 290 kcal and 9 grams of protein.  Add MVI with minerals daily.  NUTRITION DIAGNOSIS:  Severe Malnutrition related to chronic illness,cancer and cancer related treatments as evidenced by severe fat depletion,severe muscle depletion,edema.  GOAL:  Patient will meet greater than or equal to 90% of their needs  MONITOR:  PO intake,Supplement acceptance,Labs,Weight trends,Skin,I & O's  REASON FOR ASSESSMENT:  Malnutrition Screening Tool    ASSESSMENT:  80 yo female with PMH of carcinoma of the breast with metastasis to the bones who is being followed by oncology on Herceptin and Arimidex subcutaneously who also is on Zometa for bone metastasis who presents with BLE edema and BL pleural effusion. 4/26 - thoracentesis (950 ml of clear fluid removed)  Spoke with pt and cousin at bedside. Pt reports that she has been eating well at home, as people from her church and family have been bringing home-cooked meals since her previous hospital discharge.  She also reports a 20 lb weight gain since her previous discharged. Explained that this is likely from fluid. L arm and R leg still very edematous. On exam, pt has no muscle or fat stores.  Pt not open to supplements at this time but she is willing to have snacks TID (chicken/egg/tuna salad half sandwich with fruit) and Magic Cup TID. Also recommend MVI with minerals daily.  Medications: lasix, ativan Labs: reviewed; Glucose 174, serum Ca 7.4  NUTRITION - FOCUSED PHYSICAL EXAM: Flowsheet Row Most Recent Value  Orbital Region Severe depletion  Upper Arm Region Severe depletion  Thoracic and Lumbar Region Severe depletion  Buccal Region Severe depletion  Temple Region Severe depletion  Clavicle Bone Region Severe  depletion  Clavicle and Acromion Bone Region Severe depletion  Scapular Bone Region Severe depletion  Dorsal Hand Severe depletion  Patellar Region Severe depletion  Anterior Thigh Region Severe depletion  Posterior Calf Region Severe depletion  Edema (RD Assessment) Moderate  [LUE and RLE]  Hair Reviewed  Eyes Reviewed  Mouth Reviewed  Skin Reviewed  Nails Reviewed     Diet Order:   Diet Order            Diet regular Room service appropriate? Yes; Fluid consistency: Thin  Diet effective now                EDUCATION NEEDS:  Education needs have been addressed  Skin:  Skin Assessment: Skin Integrity Issues: Skin Integrity Issues:: Incisions Incisions: L mid breast, closed  Last BM:  05/29/20  Height:  Ht Readings from Last 1 Encounters:  05/27/20 5\' 1"  (1.549 m)   Weight:  Wt Readings from Last 1 Encounters:  05/27/20 48.4 kg   Ideal Body Weight:  47.7 kg  BMI:  Body mass index is 20.16 kg/m.  Estimated Nutritional Needs:  Kcal:  1400-1600 Protein:  60-75 grams Fluid:  >1.4 L  Derrel Nip, RD, LDN Registered Dietitian After Hours/Weekend Pager # in [place]

## 2020-05-29 NOTE — Progress Notes (Signed)
PROGRESS NOTE  Morgan Yu SWH:[id number] DOB: December 27, 1940 DOA: 05/27/2020 PCP: Patient, [name]  Brief History   36yow PMH metastatic breast cancer followed by oncology, on Herceptin and Arimidex, also proceeding w/ palliative radiation, presented w/ bilateral pleural effusion and acute hypoxic resp failure  A & P  Acute hypox resp failure secondary to pleural effusion --s/p thoracentesis 4/26 w/ removal 944mL clear fluid -- Worse 4/28, chest x-ray showed bilateral pleural effusions.  Underwent right-sided thoracentesis 4/26 with removal 900 mL, cytology sent -- Check chest x-ray in a.m. to assess for reaccumulation --Wean oxygen as tolerated. --Discussed with oncology, will follow up and discussed with patient.  May end up needing Pleurx catheter if fluid reaccumulate's quickly.  Metastatic breast cancer -- Follow-up with Dr. Chryl Heck as an outpatient  Chronic bilateral LE edema --Mild.  Probably related to malignancy.  2D echocardiogram normal LVEF.  No wall motion abnormalities.  Left ventricular diastolic parameters indeterminate.  Anxiety  --stable  Disposition Plan:  Discussion: Improved status postthoracentesis.  Check chest x-ray in a.m., if stable, discharge home.  Status is: Inpatient  Remains inpatient appropriate because:Inpatient level of care appropriate due to severity of illness   Dispo: The patient is from: Home              Anticipated d/c is to: Home              Patient currently is not medically stable to d/c.   Difficult to place patient Yes  DVT prophylaxis: SCDs Start: 05/27/20 1919   Code Status: DNR Level of care: Med-Surg Family Communication: none  [name], MD  [facility] Direct contact: see [email or web address] (further directions at bottom of note if needed) 7PM-7AM contact night coverage as at bottom of note 05/29/2020, 7:51 PM  LOS: 2 days   Interval History/Subjective  CC: f/u SOB  Feels better compared to prior to  admission but not as well as yesterday.  Short of breath.  Coughing.  On oxygen.  Objective   Vitals:  Vitals:   05/29/20 1500 05/29/20 1602  BP: (!) 123/49 (!) 111/54  Pulse:    Resp:    Temp:    SpO2:      Exam:  Constitutional:   . Appears ill, calm, mildly uncomfortable, nontoxic ENMT:  . grossly normal hearing  Respiratory:  . Diminished breath sounds on the left, no frank wheezes, rales or rhonchi. . Mild increased respiratory effort, able to speak in short sentences, dyspneic Cardiovascular:  . RRR, no m/r/g . 1+ bilateral LE extremity edema   Psychiatric:  . Mental status o Mood, affect appropriate  I have personally reviewed the following:   Today's Data  . No new labs today  Scheduled Meds: . anastrozole  1 mg Oral Daily  . furosemide  20 mg Oral Daily  . guaiFENesin  600 mg Oral BID  . lidocaine      . multivitamin with minerals  1 tablet Oral Daily   Continuous Infusions:  Principal Problem:   Acute hypoxemic respiratory failure (HCC) Active Problems:   Bone metastases (HCC)   Pleural effusion   Carcinoma of breast metastatic to bone (HCC)   Anxiety state   Bilateral lower extremity edema   LOS: 2 days   How to contact the [facility] Attending or Consulting provider 7A - 7P or covering provider during after hours [place], for this patient?  1. Check the care team in [facility] and look for a) attending/consulting TRH provider  listed and b) the [facility] team listed 2. Log into [email or web address] and use [place]'s universal password to access. If you do not have the password, please contact the hospital operator. 3. Locate the [facility] provider you are looking for under [facility] and page to a number that you can be directly reached. 4. If you still have difficulty reaching the provider, please page the [facility] (Director on Call) for the Hospitalists listed on amion for assistance.

## 2020-05-29 NOTE — Procedures (Signed)
PROCEDURE SUMMARY:  Successful image-guided right thoracentesis. Yielded 900 cc of clear yellow fluid. Pt tolerated procedure well. No immediate complications. EBL = trace   Specimen was sent for labs. CXR ordered.  Please see imaging section of Epic for full dictation.  Armando Gang Maxie Slovacek PA-C 05/29/2020 3:54 PM

## 2020-05-30 ENCOUNTER — Other Ambulatory Visit: Payer: Self-pay | Admitting: Hematology and Oncology

## 2020-05-30 ENCOUNTER — Encounter: Payer: Self-pay | Admitting: Hematology and Oncology

## 2020-05-30 ENCOUNTER — Inpatient Hospital Stay (HOSPITAL_COMMUNITY): Payer: Medicare HMO

## 2020-05-30 ENCOUNTER — Ambulatory Visit
Admission: RE | Admit: 2020-05-30 | Discharge: 2020-05-30 | Disposition: A | Discharge status: Home / Self Care | Payer: Medicare HMO | Source: Ambulatory Visit | Attending: Radiation Oncology | Admitting: Radiation Oncology

## 2020-05-30 DIAGNOSIS — J90 Pleural effusion, not elsewhere classified: Secondary | ICD-10-CM | POA: Diagnosis not present

## 2020-05-30 DIAGNOSIS — J9601 Acute respiratory failure with hypoxia: Secondary | ICD-10-CM | POA: Diagnosis not present

## 2020-05-30 DIAGNOSIS — C7951 Secondary malignant neoplasm of bone: Secondary | ICD-10-CM | POA: Diagnosis not present

## 2020-05-30 DIAGNOSIS — C50919 Malignant neoplasm of unspecified site of unspecified female breast: Secondary | ICD-10-CM

## 2020-05-30 DIAGNOSIS — C50912 Malignant neoplasm of unspecified site of left female breast: Secondary | ICD-10-CM | POA: Diagnosis not present

## 2020-05-30 LAB — BASIC METABOLIC PANEL
Anion gap: 9 (ref 5–15)
BUN: 22 mg/dL (ref 8–23)
CO2: 28 mmol/L (ref 22–32)
Calcium: 7.8 mg/dL — ABNORMAL LOW (ref 8.9–10.3)
Chloride: 103 mmol/L (ref 98–111)
Creatinine, Ser: 0.53 mg/dL (ref 0.44–1.00)
GFR, Estimated: >60 mL/min (ref >60)
Glucose, Bld: 158 mg/dL — ABNORMAL HIGH (ref 70–99)
Potassium: 3.7 mmol/L (ref 3.5–5.1)
Sodium: 140 mmol/L (ref 135–145)

## 2020-05-30 NOTE — Progress Notes (Signed)
PROGRESS NOTE  Morgan Yu DXI:[id number] DOB: August 03, 1940 DOA: 05/27/2020 PCP: Patient, [name]  Brief History   36yow PMH metastatic breast cancer followed by oncology, on Herceptin and Arimidex, also proceeding w/ palliative radiation, presented w/ bilateral pleural effusion and acute hypoxic resp failure.  Underwent thoracentesis on the left with nearly 1 L fluid removed.  Cytology was unrevealing.  Remained hypoxic on minimal oxygen, repeat imaging demonstrated bilateral pleural effusions, status post right thoracentesis nearly 1 L fluid removed.  Remains hypoxic.  Repeat chest x-ray 4/29 shows reaccumulation of fluid.  Seen by oncology, will proceed with palliative medicine consultation for assistance with goals of care.  Treatment options would include Pleurx catheter and/or home with hospice.  A & P  Acute hypox resp failure secondary to bilateral pleural effusion --s/p thoracentesis 4/26 w/ removal 938mL clear fluid.  Cytology unrevealing. -- Status postthoracentesis 4/28 removal 900 mL clear fluid.  Cytology pending. -- Repeat chest x-ray today 4/29 demonstrates reaccumulation of fluid. --Although unproven, presumed malignant in nature.  Metastatic breast cancer -- Appreciate oncology consultation.  Will engage palliative medicine for consideration of treatment options including Pleurx catheter, home with hospice.  Chronic bilateral LE edema -- Appears nearly resolved.  Probably related to malignancy.  2D echocardiogram normal LVEF.  No wall motion abnormalities.  Left ventricular diastolic parameters indeterminate.  Anxiety  --stable  Disposition Plan:  Discussion: Status postthoracentesis on the left and the right.  Imaging reveals reaccumulation of fluid fairly rapidly.  Prognosis is guarded.  Oncology involved.  Will involve palliative medicine.    Status is: Inpatient  Remains inpatient appropriate because:Inpatient level of care appropriate due to severity  of illness   Dispo: The patient is from: Home              Anticipated d/c is to: Home              Patient currently is not medically stable to d/c.   Difficult to place patient Yes  DVT prophylaxis: SCDs Start: 05/27/20 1919   Code Status: DNR Level of care: Med-Surg Family Communication: none  Morgan Hodgkins, MD  [facility] Direct contact: see [email or web address] (further directions at bottom of note if needed) 7PM-7AM contact night coverage as at bottom of note 05/30/2020, 4:01 PM  LOS: 3 days   Interval History/Subjective  CC: f/u SOB  Feels okay today after thoracentesis yesterday breathing was better.  Still requiring oxygen.  Objective   Vitals:  Vitals:   05/30/20 1053 05/30/20 1451  BP: 138/64 (!) 142/66  Pulse: 100 93  Resp: 18 18  Temp:  98.9 F (37.2 C)  SpO2: 96% 98%    Exam:  Constitutional:   . Appears calm, mildly uncomfortable, ill but nontoxic Respiratory:  . CTA bilaterally, no w/r/r.  . Respiratory effort normal.  Cardiovascular:  . RRR, no m/r/g . No significant LE extremity edema   Psychiatric:  . Mental status o Mood, affect appropriate  I have personally reviewed the following:   Today's Data  . BMP unremarkable . CXR independently reviewed: Bilateral pleural effusions increased today.  Scheduled Meds: . anastrozole  1 mg Oral Daily  . furosemide  20 mg Oral Daily  . guaiFENesin  600 mg Oral BID  . multivitamin with minerals  1 tablet Oral Daily   Continuous Infusions:  Principal Problem:   Acute hypoxemic respiratory failure (HCC) Active Problems:   Bone metastases (HCC)   Pleural effusion   Carcinoma of breast metastatic  to bone [facility])   Anxiety state   Bilateral lower extremity edema   LOS: 3 days   How to contact the [facility] Attending or Consulting provider [place] or covering provider during after hours [place], for this patient?  1. Check the care team in [facility] and look for a) attending/consulting TRH provider listed  and b) the [facility] team listed 2. Log into [email or web address] and use [place]'s universal password to access. If you do not have the password, please contact the hospital operator. 3. Locate the [facility] provider you are looking for under [facility] and page to a number that you can be directly reached. 4. If you still have difficulty reaching the provider, please page the [facility] (Director on Call) for the Hospitalists listed on amion for assistance.

## 2020-05-30 NOTE — Progress Notes (Signed)
I talked to Ms Morgan Yu about [name]. Patient apparently was wondering if the procedures are all worth or if she would like to be comfortable She has in the past expressed to me several times that if her quality of life is compromised,s he would want to be just comfortable, Our NP will see the patient today I think its appropriate to engage palliative care to discuss goals of care. They understand that she may have to go with hospice if she declines procedures.  Elizet Kaplan

## 2020-05-30 NOTE — Care Management Important Message (Signed)
Important Message  Patient Details  IM Letter given to the Patient. Name: Morgan Yu MRN: [id number] Date of Birth: Mar 26, 1940   Medicare Important Message Given:  Yes     Kerin Salen 05/30/2020, 10:45 AM

## 2020-05-30 NOTE — Progress Notes (Signed)
HEMATOLOGY-ONCOLOGY PROGRESS NOTE  SUBJECTIVE: The patient is followed by our office for metastatic breast cancer to the bone.  She is receiving subcutaneous Herceptin and oral Arimidex.  She developed worsening shortness of breath and hypoxia earlier this week and was transferred to the emergency room.  A thoracentesis was performed on 4/26 and 950 cc of fluid was removed.  Cytology pending.  She had a repeat thoracentesis performed yesterday.  She reports some improvement of her breathing.  She notes a nonproductive cough.  She had a low-grade fever last evening.  Denies chest pain, abdominal pain, nausea, vomiting.  Oncology History  Carcinoma of breast metastatic to bone ([place])  03/19/2020 Initial Diagnosis   Carcinoma of breast metastatic to bone ([place])   03/24/2020 -  Chemotherapy    Patient is on Treatment Plan: BREAST TRASTUZUMAB Q21D      04/15/2020 Cancer Staging   Staging form: Breast, AJCC 8th Edition - Clinical stage from 04/15/2020: Stage IV (cTX, cNX, cM1, GX, ER+, PR-, HER2+) - Signed by [name], MD on 04/15/2020 Stage prefix: Initial diagnosis Histologic grading system: 3 grade system      REVIEW OF SYSTEMS:   Constitutional: Had a fever last evening Eyes: Denies blurriness of vision Ears, nose, mouth, throat, and face: Denies mucositis or sore throat Respiratory: Reports nonproductive cough, improvement of her breathing following thoracentesis Cardiovascular: Denies palpitation, chest discomfort Gastrointestinal:  Denies nausea, heartburn or change in bowel habits Skin: Skin changes to left breast/axilla Lymphatics: Denies new lymphadenopathy or easy bruising Neurological:Denies numbness, tingling or new weaknesses Behavioral/Psych: Mood is stable, no new changes  Extremities: No lower extremity edema All other systems were reviewed with the patient and are negative.  I have reviewed the past medical history, past surgical history, social history and family  history with the patient and they are unchanged from previous note.   PHYSICAL EXAMINATION: ECOG PERFORMANCE STATUS: 2 - Symptomatic, <50% confined to bed  Vitals:   05/29/20 2100 05/30/20 0528  BP: (!) 168/78 (!) 121/57  Pulse: 100 100  Resp: 20 (!) 24  Temp: (!) 100.8 F (38.2 C) 98.6 F (37 C)  SpO2: (!) 86% (!) 88%   Filed Weights   05/27/20 1434 05/27/20 2038  Weight: 51.5 kg 48.4 kg    Intake/Output from previous day: 04/28 0701 - 04/29 0700 In: 600 [P.O.:600] Out: 300 [Urine:300]  GENERAL: Elderly female, no distress LUNGS: Diminished breath sounds at the bases HEART: regular rate & rhythm and no murmurs and trace bilateral lower extremity edema ABDOMEN:abdomen soft, non-tender and normal bowel sounds  NEURO: alert & oriented x 3 with fluent speech, no focal motor/sensory deficits  LABORATORY DATA:  I have reviewed the data as listed CMP Latest Ref Rng & Units 05/30/2020 05/28/2020 05/27/2020  Glucose 70 - 99 mg/dL 158(H) 174(H) 130(H)  BUN 8 - 23 mg/dL 22 19 14   Creatinine 0.44 - 1.00 mg/dL 0.53 0.40(L) 0.39(L)  Sodium 135 - 145 mmol/L 140 141 137  Potassium 3.5 - 5.1 mmol/L 3.7 3.9 3.9  Chloride 98 - 111 mmol/L 103 106 102  CO2 22 - 32 mmol/L 28 25 26   Calcium 8.9 - 10.3 mg/dL 7.8(L) 7.4(L) 8.5(L)  Total Protein 6.5 - 8.1 g/dL - 5.6(L) 7.5  Total Bilirubin 0.3 - 1.2 mg/dL - 0.2(L) 0.5  Alkaline Phos 38 - 126 U/L - 286(H) 360(H)  AST 15 - 41 U/L - 27 37  ALT 0 - 44 U/L - 21 31    Lab Results  Component Value Date   WBC 7.9 05/28/2020   HGB 10.7 (L) 05/28/2020   HCT 35.0 (L) 05/28/2020   MCV 94.1 05/28/2020   PLT 280 05/28/2020   NEUTROABS 7.7 05/27/2020    DG Chest 2 View  Result Date: 05/29/2020 CLINICAL DATA:  Shortness of breath EXAM: CHEST - 2 VIEW COMPARISON:  May 27, 2020 FINDINGS: Pleural effusions bilaterally again noted. There is interstitial pulmonary edema with areas of upper lobe atelectatic change bilaterally. There is consolidation in  portions of each lung base. Heart is mildly enlarged with a degree of pulmonary venous hypertension. No adenopathy. There is aortic atherosclerosis. Multiple sclerotic bone lesions identified consistent with metastatic disease from known breast carcinoma. IMPRESSION: 1. Pleural effusions bilaterally with a degree of interstitial edema. There is cardiomegaly with pulmonary vascular congestion. Appearance felt to be indicative of a degree of underlying congestive heart failure. 2. Bibasilar atelectasis. A degree of bibasilar consolidation is questioned. 3.  Sclerotic bony metastases again noted. 4.  Aortic Atherosclerosis (ICD10-I70.0). Electronically Signed   By: Lowella Grip III M.D.   On: 05/29/2020 11:43   CT CHEST ABDOMEN PELVIS W CONTRAST  Result Date: 05/27/2020 CLINICAL DATA:  Breast cancer with bone metastasis. Short of breath. Leg swelling. EXAM: CT CHEST, ABDOMEN, AND PELVIS WITH CONTRAST TECHNIQUE: Multidetector CT imaging of the chest, abdomen and pelvis was performed following the standard protocol during bolus administration of intravenous contrast. CONTRAST:  160m OMNIPAQUE IOHEXOL 300 MG/ML  SOLN COMPARISON:  CT 02/27/2020 FINDINGS: CT CHEST FINDINGS Cardiovascular: No significant vascular findings. Normal heart size. No pericardial effusion. Mediastinum/Nodes: Enlarged LEFT axillary lymph node measures 16 mm compares to 20 mm. Several peripheral lymph nodes/nodules in the LEFT axilla seen on comparison exam are no longer evident. Asymmetric thickened tissue in the LEFT breast measuring 16 mm compares to 19 mm. Lungs/Pleura: Nodule in the RIGHT upper lobe measuring 8 mm (image 54/series 4) compares to 7 mm. Smaller nodule in the RIGHT upper lobe measuring 4 mm on image 60 compares to 2 mm. There is interval increase in bilateral layering pleural effusions. Interval increase in bibasilar passive atelectasis. RIGHT lower lobe atelectasis greater than RIGHT. Musculoskeletal: Diffuse osseous  metastasis again noted. The metastasis is diffusely sclerotic where previously lesions were mixed lytic and sclerotic. Several expansile lesions are noted. Example RIGHT lateral second rib measuring 20 mm compares to 25 mm. CT ABDOMEN AND PELVIS FINDINGS Hepatobiliary: Previously round enhancing lesions within the liver are now represented by hypoenhancing lesions. Overall volume of the liver is decrease measuring 19.9 cm in axial dimension (image 47/CT series 2) compared to 22 cm similar level on comparison exam. Pancreas: Pancreas is normal. No ductal dilatation. No pancreatic inflammation. Spleen: Normal spleen Adrenals/urinary tract: Thickening of the LEFT adrenal gland to 14 mm compares to 13 mm. RIGHT adrenal gland appears normal. Kidneys ureters and bladder normal. Stomach/Bowel: No bowel obstruction.  No bowel lesion identified Vascular/Lymphatic: Abdominal aorta is normal caliber with atherosclerotic calcification. There is no retroperitoneal or periportal lymphadenopathy. No pelvic lymphadenopathy. Reproductive: Uterus and adnexa unremarkable. Other: Interval increase in intraperitoneal free fluid. Musculoskeletal: As with the bones in the thorax, the previously mixed lytic and sclerotic metastatic disease has been near complete place by diffuse sclerotic pattern. IMPRESSION: 1. Overall there appears to be a positive response to therapy with decrease in size and number of LEFT axillary lymph nodes and LEFT breast mass. Additionally the volume of the liver is decreased suggesting a positive response to diffuse hepatic metastasis. 2.  Previously seen diffuse lytic and sclerotic skeletal metastasis have been replaced by uniform bone sclerosis also suggesting a positive response. Several of the expansile lesions are reduced in volume. 3. Similar RIGHT lung pulmonary nodules. 4. Interval increase in large volume pleural effusions with interval increase in bibasilar passive atelectasis. This coincides with  patient's shortness of breath. These results will be called to the ordering clinician or representative by the Radiologist Assistant, and communication documented in the PACS or [employer]. Electronically Signed   By: Suzy Bouchard M.D.   On: 05/27/2020 09:33   DG Chest Port 1 View  Result Date: 05/29/2020 CLINICAL DATA:  80 year old female with bilateral pleural effusions status post right-sided thoracentesis. EXAM: PORTABLE CHEST 1 VIEW COMPARISON:  Earlier radiograph dated 05/29/2020 FINDINGS: Interval decrease in the size of the right pleural effusion. There is a small to moderate left pleural effusion and associated left lung base atelectasis. Pneumonia is not excluded. No pneumothorax. Stable cardiomediastinal silhouette. Osteopenia with degenerative changes of the spine. Sclerotic bone metastasis as seen on the earlier radiograph. Old right rib fractures. No acute osseous pathology. IMPRESSION: 1. Interval decrease in the size of the right pleural effusion. No pneumothorax. 2. Small to moderate left pleural effusion and associated left lung base atelectasis. Electronically Signed   By: Anner Crete M.D.   On: 05/29/2020 16:28   DG Chest Port 1 View  Result Date: 05/27/2020 CLINICAL DATA:  Shortness of breath and cough, left thoracentesis earlier today EXAM: PORTABLE CHEST 1 VIEW COMPARISON:  Same day chest radiograph at 1720 hours. FINDINGS: The heart size and mediastinal contours are partially obscured but appear unchanged. Decreased versus redistributed moderate right pleural effusion with stable small left pleural effusion. Slightly improved expansion of the left lung otherwise persistent bilateral interstitial and airspace opacities. The visualized skeletal structures are unchanged. IMPRESSION: 1. Decreased versus redistributed moderate right pleural effusion with stable small left pleural effusion. 2. Slightly improved expansion of the left lung otherwise persistent bilateral  interstitial and airspace opacities. Electronically Signed   By: Dahlia Bailiff MD   On: 05/27/2020 20:01   DG Chest Port 1 View  Result Date: 05/27/2020 CLINICAL DATA:  Shortness of breath, cough. EXAM: PORTABLE CHEST 1 VIEW COMPARISON:  February 29, 2020. FINDINGS: Stable cardiomediastinal silhouette. Significantly increased bibasilar opacities are noted concerning for mild pleural effusions and associated bibasilar edema or atelectasis. Bony thorax is unremarkable. No pneumothorax is noted. IMPRESSION: Increased bibasilar opacities are noted concerning for worsening pleural effusions and bibasilar edema or atelectasis. Electronically Signed   By: Marijo Conception M.D.   On: 05/27/2020 17:53   ECHOCARDIOGRAM COMPLETE  Result Date: 05/28/2020    ECHOCARDIOGRAM REPORT   Patient Name:   Morgan Yu Date of Exam: 05/28/2020 Medical Rec #:  [id number]      Height:       61.0 in Accession #:    [id number]     Weight:       106.7 lb Date of Birth:  1941-01-19       BSA:          1.446 m Patient Age:    50 years       BP:           116/54 mmHg Patient Gender: F              HR:           95 bpm. Exam Location:  Inpatient Procedure: 2D Echo Indications:  CHF-Acute Diastolic J49.70  History:        Patient has prior history of Echocardiogram examinations, most                 recent 02/28/2020.  Sonographer:    Mikki Santee RDCS (AE) Referring Phys: [place]  1. Left ventricular ejection fraction, by estimation, is 60 to 65%. The left ventricle has normal function. The left ventricle has no regional wall motion abnormalities. Left ventricular diastolic parameters are indeterminate.  2. Right ventricular systolic function is normal. The right ventricular size is normal. Tricuspid regurgitation signal is inadequate for assessing PA pressure.  3. A small pericardial effusion is present.  4. The mitral valve is normal in structure. No evidence of mitral valve regurgitation.  5. The aortic  valve was not well visualized. Aortic valve regurgitation is not visualized. No aortic stenosis is present.  6. The inferior vena cava is normal in size with greater than 50% respiratory variability, suggesting right atrial pressure of 3 mmHg. FINDINGS  Left Ventricle: Left ventricular ejection fraction, by estimation, is 60 to 65%. The left ventricle has normal function. The left ventricle has no regional wall motion abnormalities. The left ventricular internal cavity size was normal in size. There is  no left ventricular hypertrophy. Left ventricular diastolic parameters are indeterminate. Right Ventricle: The right ventricular size is normal. No increase in right ventricular wall thickness. Right ventricular systolic function is normal. Tricuspid regurgitation signal is inadequate for assessing PA pressure. Left Atrium: Left atrial size was normal in size. Right Atrium: Right atrial size was normal in size. Pericardium: A small pericardial effusion is present. Mitral Valve: The mitral valve is normal in structure. No evidence of mitral valve regurgitation. Tricuspid Valve: The tricuspid valve is normal in structure. Tricuspid valve regurgitation is not demonstrated. Aortic Valve: The aortic valve was not well visualized. Aortic valve regurgitation is not visualized. No aortic stenosis is present. Pulmonic Valve: The pulmonic valve was not well visualized. Pulmonic valve regurgitation is not visualized. Aorta: The aortic root is normal in size and structure. Venous: The inferior vena cava is normal in size with greater than 50% respiratory variability, suggesting right atrial pressure of 3 mmHg. IAS/Shunts: The interatrial septum was not well visualized.  LEFT VENTRICLE PLAX 2D LVIDd:         3.70 cm  Diastology LVIDs:         2.50 cm  LV e' medial:    7.27 cm/s LV PW:         1.00 cm  LV E/e' medial:  14.3 LV IVS:        1.00 cm  LV e' lateral:   6.42 cm/s LVOT diam:     1.90 cm  LV E/e' lateral: 16.2 LV SV:          66 LV SV Index:   45 LVOT Area:     2.84 cm  RIGHT VENTRICLE RV S prime:     14.20 cm/s TAPSE (M-mode): 1.8 cm LEFT ATRIUM             Index       RIGHT ATRIUM          Index LA diam:        3.10 cm 2.14 cm/m  RA Area:     9.61 cm LA Vol (A2C):   48.4 ml 33.47 ml/m RA Volume:   17.90 ml 12.38 ml/m LA Vol (A4C):   38.8 ml 26.83 ml/m  LA Biplane Vol: 46.0 ml 31.81 ml/m  AORTIC VALVE LVOT Vmax:   104.00 cm/s LVOT Vmean:  77.000 cm/s LVOT VTI:    0.232 m  AORTA Ao Root diam: 2.60 cm MITRAL VALVE MV Area (PHT): 3.27 cm     SHUNTS MV Decel Time: 232 msec     Systemic VTI:  0.23 m MV E velocity: 104.00 cm/s  Systemic Diam: 1.90 cm MV A velocity: 116.00 cm/s MV E/A ratio:  0.90 Oswaldo Milian MD Electronically signed by Oswaldo Milian MD Signature Date/Time: 05/28/2020/1:05:06 PM    Final    US THORACENTESIS ASP PLEURAL SPACE W/IMG GUIDE  Result Date: 05/27/2020 INDICATION: Shortness of breath, left pleural effusion seen on previous CT scan. Request for therapeutic and diagnostic thoracentesis. EXAM: ULTRASOUND GUIDED LEFT THORACENTESIS MEDICATIONS: 1% lidocaine COMPLICATIONS: None immediate. PROCEDURE: An ultrasound guided thoracentesis was thoroughly discussed with the patient and questions answered. The benefits, risks, alternatives and complications were also discussed. The patient understands and wishes to proceed with the procedure. Written consent was obtained. Ultrasound was performed to localize and mark an adequate pocket of fluid in the left chest. The area was then prepped and draped in the normal sterile fashion. 1% Lidocaine was used for local anesthesia. Under ultrasound guidance a 6 Fr Safe-T-Centesis catheter was introduced. Thoracentesis was performed. The catheter was removed and a dressing applied. FINDINGS: A total of approximately 950 cc of clear yellow fluid was removed. Samples were sent to the laboratory as requested by the clinical team. Post procedure chest x-ray ordered.  IMPRESSION: Successful ultrasound guided left thoracentesis yielding 950 cc of pleural fluid. Read by: Durenda Guthrie, PA-C Electronically Signed   By: Markus Daft M.D.   On: 05/27/2020 16:51    ASSESSMENT AND PLAN: Carcinoma of breast metastatic to bone [facility]) Given her older age and co-morbidities, we started her on subcutaneous Herceptin, Arimidex -She started arimidex on 03/11/2020, tolerating it very well so far, no adverse effects reported. - she is tolerating [place] herceptin reasonably well, LN in neck have resolved, breast mass and axillary mass have improved.  Most recent imaging consistent with response. -Repeat echo performed 05/28/2020 showed LVEF of 60 to 65%.  We will plan to continue Herceptin as an outpatient. -The patient is unsure if all these procedures are worth it and would like to be kept comfortable.  She has expressed concern over her quality of life in the past. -It would be appropriate to engage palliative care and goals of care discussion. -She understands that if declines procedures, she will need to enroll with hospice.  We will stop her treatment if she opts to enroll with hospice.  Bilateral lower extremity edema -BLE stable,  LUE swelling remains the same. -She may benefit from gentle IV diuresis given the worsening bilateral pleural effusion as well.  Pleural effusion -Worsening compared to last imaging with clinical worsening of SOB and hypoxia. -She is now on oxygen -status post thoracentesis x2 -Cytology pending from fluid sent 4/28 and will follow-up. -According to imaging and clinically, she is responding to current treatment, so this worsening pleural effusion may or may not be related to breast cancer -If the patient is found to have a malignant pleural effusion or has rapidly reaccumulated pleural effusion, may consider Pleurx catheter placement.  Bone metastases ([place]) -Currently on zometa -No worsening pain.    LOS: 3 days   Mikey Bussing, DNP,  AGPCNP-BC, AOCNP 05/30/20

## 2020-05-31 DIAGNOSIS — Z7189 Other specified counseling: Secondary | ICD-10-CM | POA: Diagnosis not present

## 2020-05-31 DIAGNOSIS — C50912 Malignant neoplasm of unspecified site of left female breast: Secondary | ICD-10-CM | POA: Diagnosis not present

## 2020-05-31 DIAGNOSIS — Z515 Encounter for palliative care: Secondary | ICD-10-CM | POA: Diagnosis not present

## 2020-05-31 DIAGNOSIS — J90 Pleural effusion, not elsewhere classified: Secondary | ICD-10-CM | POA: Diagnosis not present

## 2020-05-31 DIAGNOSIS — C50919 Malignant neoplasm of unspecified site of unspecified female breast: Secondary | ICD-10-CM | POA: Diagnosis not present

## 2020-05-31 DIAGNOSIS — C7951 Secondary malignant neoplasm of bone: Secondary | ICD-10-CM | POA: Diagnosis not present

## 2020-05-31 DIAGNOSIS — J9601 Acute respiratory failure with hypoxia: Secondary | ICD-10-CM | POA: Diagnosis not present

## 2020-05-31 DIAGNOSIS — R531 Weakness: Secondary | ICD-10-CM

## 2020-05-31 LAB — BODY FLUID CULTURE W GRAM STAIN: Culture: NO GROWTH

## 2020-05-31 MED ORDER — GUAIFENESIN-DM 100-10 MG/5ML PO SYRP
5.0000 mL | ORAL_SOLUTION | ORAL | Status: DC | PRN
  Administered 2020-05-31 – 2020-06-04 (×8): 5 mL via ORAL
  Filled 2020-05-31 (×8): qty 10

## 2020-05-31 MED ORDER — FUROSEMIDE 10 MG/ML IJ SOLN
20.0000 mg | Freq: Once | INTRAMUSCULAR | Status: AC
  Administered 2020-05-31: 20 mg via INTRAVENOUS
  Filled 2020-05-31: qty 2

## 2020-05-31 MED ORDER — FUROSEMIDE 40 MG PO TABS
40.0000 mg | ORAL_TABLET | Freq: Every day | ORAL | Status: DC
  Administered 2020-06-01 – 2020-06-06 (×5): 40 mg via ORAL
  Filled 2020-05-31 (×5): qty 1

## 2020-05-31 MED ORDER — LOPERAMIDE HCL 2 MG PO CAPS: 2.0000 mg | ORAL_CAPSULE | ORAL | Status: DC | PRN

## 2020-05-31 NOTE — Evaluation (Signed)
Physical Therapy Evaluation Patient Details Name: Morgan Yu MRN: [id number] DOB: 1940-11-04 Today's Date: 05/31/2020   History of Present Illness  45yow PMH metastatic breast cancer followed by oncology, on Herceptin and Arimidex, also proceeding w/ palliative radiation, presented w/ bilateral pleural effusion and acute hypoxic resp failure.  Underwent thoracentesis on the left and right .  Repeat chest x-ray 4/29 showed reaccumulation of fluid.  Seen by oncology and palliative medicine.  Clinical Impression  Patient resting in bed , family at bedside. Patient on 2 L, SPO2 95%. Patient ambulated in room x 25' x 2 using RW. SPO2 87% after ambulation, noted increased cough with activity.  Patient uses 2 wheeled RW at baseline, lives alone. Patient has had meals brought in by her church. Pt admitted with above diagnosis.  Pt currently with functional limitations due to the deficits listed below (see PT Problem List). Pt will benefit from skilled PT to increase their independence and safety with mobility to allow discharge to the venue listed below.       Follow Up Recommendations Home health PT    Equipment Recommendations  None recommended by PT    Recommendations for Other Services       Precautions / Restrictions Precautions Precaution Comments: monitor sats, on O2      Mobility  Bed Mobility Overal bed mobility: Modified Independent                  Transfers Overall transfer level: Needs assistance Equipment used: Rolling walker (2 wheeled) Transfers: Sit to/from Stand Sit to Stand: Supervision            Ambulation/Gait Ambulation/Gait assistance: Supervision Gait Distance (Feet): 25 Feet (x 2) Assistive device: Rolling walker (2 wheeled) Gait Pattern/deviations: Step-through pattern Gait velocity: decreased   General Gait Details: manages  ambualtion around bed, turns. No balance losses.  Stairs            Wheelchair Mobility    Modified  [place] (Stroke Patients Only)       Balance Overall balance assessment: Mild deficits observed, not formally tested                                           Pertinent Vitals/Pain Pain Assessment: No/denies pain    Home Living Family/patient expects to be discharged to:: Private residence Living Arrangements: Alone Available Help at Discharge: Family;Friend(s);Available PRN/intermittently Type of Home: House Home Access: Stairs to enter Entrance Stairs-Rails: None Entrance Stairs-Number of Steps: 3 Home Layout: One level Home Equipment: Walker - 4 wheels Additional Comments: rollator does not fit into kitchen    Prior Function Level of Independence: Independent         Comments: Independent with household mobility without AD. Does not drive. Completes ADLs/IADLs , recently has food brought in from church but stopped them due to too much, wants to do her own cooking.including light meal prep with I.     Hand Dominance   Dominant Hand: Right    Extremity/Trunk Assessment   Upper Extremity Assessment Upper Extremity Assessment: Generalized weakness    Lower Extremity Assessment Lower Extremity Assessment: Generalized weakness    Cervical / Trunk Assessment Cervical / Trunk Assessment: Kyphotic  Communication   Communication: No difficulties  Cognition Arousal/Alertness: Awake/alert Behavior During Therapy: WFL for tasks assessed/performed Overall Cognitive Status: Within Functional Limits for tasks assessed  General Comments      Exercises     Assessment/Plan    PT Assessment Patient needs continued PT services  PT Problem List Decreased strength;Decreased mobility;Decreased activity tolerance;Cardiopulmonary status limiting activity;Decreased balance       PT Treatment Interventions DME instruction;Therapeutic activities;Gait training;Therapeutic exercise;Patient/family  education;Functional mobility training    PT Goals (Current goals can be found in the Care Plan section)  Acute Rehab PT Goals Patient Stated Goal: go home, take care of myself PT Goal Formulation: With patient/family Time For Goal Achievement: 06/14/20 Potential to Achieve Goals: Fair    Frequency Min 3X/week   Barriers to discharge Decreased caregiver support      Co-evaluation               AM-PAC PT "6 Clicks" Mobility  Outcome Measure Help needed turning from your back to your side while in a flat bed without using bedrails?: None Help needed moving from lying on your back to sitting on the side of a flat bed without using bedrails?: None Help needed moving to and from a bed to a chair (including a wheelchair)?: A Little Help needed standing up from a chair using your arms (e.g., wheelchair or bedside chair)?: A Little Help needed to walk in hospital room?: A Little Help needed climbing 3-5 steps with a railing? : A Lot 6 Click Score: 19    End of Session Equipment Utilized During Treatment: Oxygen Activity Tolerance: Patient tolerated treatment well Patient left: in bed;with call bell/phone within reach;with family/visitor present;with bed alarm set Nurse Communication: Mobility status PT Visit Diagnosis: Unsteadiness on feet (R26.81);Difficulty in walking, not elsewhere classified (R26.2)    Time:  -1600     Charges:   PT Evaluation $PT Eval Low Complexity: 1 Low PT Treatments $Gait Training: 8-22 mins        Tresa Endo PT Acute Rehabilitation Services Pager [phone] Office [phone]   Claretha Cooper 05/31/2020, 4:23 PM

## 2020-05-31 NOTE — Plan of Care (Addendum)
Patient remains on 2 liters oxygen to maintain oxygen saturation in the low to mid 90's.  Some mild shortness of breath with exertion.  Family members at bedside.

## 2020-05-31 NOTE — Consult Note (Signed)
Consultation Note Date: 05/31/2020   Patient Name: DEMECIA Yu  DOB: 20-Jun-1940  MRN: [id number]  Age / Sex: 80 y.o., female  PCP: Patient, [name] Referring Physician: Samuella Cota, MD  Reason for Consultation: Establishing goals of care  HPI/Patient Profile: 80 y.o. female admitted on 05/27/2020    Clinical Assessment and Goals of Care: 80 year old lady who lives by herself in [place], [place].  Patient has a cousin Morgan Yu who is her healthcare power of attorney agent.  Patient has a life limiting illness of metastatic breast cancer she is followed by oncology she is on Herceptin and Arimidex.  She is also undergoing palliative radiation.  Patient presented with bilateral pleural effusions and acute hypoxic respiratory failure.  Patient's has undergone thoracentesis and extensive fluid removal.  Patient noted to have rapid reaccumulation of fluid on repeat chest x-ray.  Palliative consultation for goals of care discussions has been requested.  Patient is awake alert resting in bed.  She is able to ambulate short distances.  At present does not have extensive symptom burden of dyspnea at the time of my assessment.  Patient does complain of shortness of breath at times.  I introduced myself and palliative care as follows:  Palliative medicine is specialized medical care for people living with serious illness. It focuses on providing relief from the symptoms and stress of a serious illness. The goal is to improve quality of life for both the patient and the family.  Goals of care: Broad aims of medical therapy in relation to the patient's values and preferences. Our aim is to provide medical care aimed at enabling patients to achieve the goals that matter most to them, given the circumstances of their particular medical situation and their constraints.   Goals wishes and values  important to the patient attempted to be explored.  We discussed about her current condition and next steps going forward.  Goals of care discussions attempted to be undertaken.  Discussed with the patient about Pleurx catheter and its indications.  We also talked about differences between palliative and hospice.  Patient already [facility] with [facility].  Call placed and also discussed with cousin Morgan Yu.  I have learned that the patient does not want to go to hospice house just yet.  She did not want to go to a nursing facility either.  Patient wishes to be independent in her home in her own surroundings for as long as possible.  We talked about how home with hospice can help [place] achieve that goal.  She was not on home oxygen prior to this hospitalization.  Patient will likely need home oxygen at least for comfort measures upon discharge.  See additional recommendations below.  HCPOA  cousin Morgan Yu  SUMMARY [place] with DNR. Continue current mode of care for now. Could consider placement of Pleurx catheter as a symptom management option and for ease or for drainage of effusion going forward. Ongoing discussions with  the patient as well as her healthcare power of attorney cousin Morgan Yu about broad goals of care and appropriate disposition options: the following 2 options have been discussed:  Continuing cancer directed care with continuation of radiation and infusions and home-based palliative to continue  versus  Election of hospice support, foregoing further radiation and infusions and focusing on quality of life and symptom management at her home.  Discussed with patient in detail.  Separately, call placed and discussed the above with healthcare power of attorney Morgan Yu as well.  She will continue discussions with the patient as well.  The patient wants to touch base with oncology again on Monday 5-2.  Palliative will also  follow-up.  Thank you for the consult. Code Status/Advance Care Planning:  DNR    Symptom Management:      Palliative Prophylaxis:   Bowel Regimen   Psycho-social/Spiritual:   Desire for further Chaplaincy support:yes  Additional Recommendations: Education on Hospice  Prognosis:   Unable to determine  Discharge Planning: To Be Determined      Primary Diagnoses: Present on Admission: . Anxiety state . Bilateral lower extremity edema . Bone metastases ([place]) . Carcinoma of breast metastatic to bone ([place]) . Pleural effusion   I have reviewed the medical record, interviewed the patient and family, and examined the patient. The following aspects are pertinent.  Past Medical History:  Diagnosis Date  . Breast cancer ([place])    with bone metastasis   Social History   Socioeconomic History  . Marital status: Widowed    Spouse name: Not on file  . Number of children: Not on file  . Years of education: Not on file  . Highest education level: Not on file  Occupational History  . Not on file  Tobacco Use  . Smoking status: Never Smoker  . Smokeless tobacco: Never Used  Vaping Use  . Vaping Use: Never used  Substance and Sexual Activity  . Alcohol use: Not Currently  . Drug use: Never  . Sexual activity: Not on file  Other Topics Concern  . Not on file  Social History Narrative   No biological children   Has 2 stepchildren   Social Determinants of Health   Financial Resource Strain: Not on file  Food Insecurity: Not on file  Transportation Needs: Not on file  Physical Activity: Not on file  Stress: Not on file  Social Connections: Not on file   Family History  Problem Relation Age of Onset  . Stroke Mother   . Heart attack Father   . Cancer Brother   . Esophageal cancer Maternal Aunt   . Breast cancer Maternal Aunt    Scheduled Meds: . anastrozole  1 mg Oral Daily  . furosemide  20 mg Intravenous Once  . [START ON 06/01/2020] furosemide  40 mg  Oral Daily  . guaiFENesin  600 mg Oral BID  . multivitamin with minerals  1 tablet Oral Daily   Continuous Infusions: PRN Meds:.guaiFENesin-dextromethorphan, LORazepam, morphine injection, ondansetron **OR** ondansetron (ZOFRAN) IV, senna, sodium chloride Medications Prior to Admission:  Prior to Admission medications   Medication Sig Start Date End Date Taking? Authorizing Provider  acetaminophen (TYLENOL) 500 MG tablet Take 500 mg by mouth every 6 (six) hours as needed for moderate pain.   Yes [provider]  anastrozole (ARIMIDEX) 1 MG tablet TAKE 1 TABLET BY MOUTH EVERY DAY Patient taking differently: Take 1 mg by mouth daily. 05/05/20  Yes [name], MD  Chlorphen-Pseudoephed-APAP (TYLENOL  ALLERGY SINUS PO) Take 1 tablet by mouth daily as needed (allergy).   Yes [provider]  furosemide (LASIX) 20 MG tablet TAKE 1 TABLET BY MOUTH EVERY DAY Patient taking differently: Take 20 mg by mouth daily. 05/06/20  Yes Iruku, Arletha Pili, MD  LORazepam (ATIVAN) 0.5 MG tablet Take 1 tablet (0.5 mg total) by mouth every 8 (eight) hours as needed for anxiety. Take 1 hour prior to radiation therapy Patient taking differently: Take 0.5 mg by mouth every 8 (eight) hours as needed for anxiety. 05/21/20  Yes Gery Pray, MD  oxymetazoline (AFRIN) 0.05 % nasal spray Place 1 spray into both nostrils daily as needed for congestion.   Yes [provider]  senna (SENOKOT) 8.6 MG TABS tablet Take 8.6 mg by mouth daily as needed for mild constipation.   Yes [provider]  feeding supplement (ENSURE ENLIVE / ENSURE PLUS) LIQD Take 237 mLs by mouth 3 (three) times daily between meals. Patient not taking: No sig reported 03/05/20   Antonieta Pert, MD  mirtazapine (REMERON) 15 MG tablet Take 1 tablet (15 mg total) by mouth at bedtime. Patient not taking: Reported on 05/27/2020 03/05/20 04/04/20  Antonieta Pert, MD  ondansetron (ZOFRAN) 4 MG tablet Take 1 tablet (4 mg total) by mouth every 8  (eight) hours as needed for nausea or vomiting. Patient not taking: No sig reported 03/24/20   Harle Stanford., PA-C   Allergies  Allergen Reactions  . Vicodin Hp [Hydrocodone-Acetaminophen]     Brain fog   Review of Systems Occasional episodic dyspnea. Physical Exam Appears generally ill but without any acute symptoms Regular work of breathing Does not have edema Has generalized weakness S1-S2 Abdomen not distended Awake alert no focal deficits  Vital Signs: BP 136/62 (BP Location: Right Arm)   Pulse 86   Temp 98.3 F (36.8 C) (Oral)   Resp 20   Ht 5\' 1"  (1.549 m)   Wt 48.4 kg   SpO2 94%   BMI 20.16 kg/m  Pain Scale: 0-10   Pain Score: 0-No pain   SpO2: SpO2: 94 % O2 Device:SpO2: 94 % O2 Flow Rate: .O2 Flow Rate (L/min): 2 L/min  IO: Intake/output summary:   Intake/Output Summary (Last 24 hours) at 05/31/2020 1234 Last data filed at 05/31/2020 6294 Gross per 24 hour  Intake 290 ml  Output 700 ml  Net -410 ml    LBM: Last BM Date: 05/30/20 Baseline Weight: Weight: 51.5 kg Most recent weight: Weight: 48.4 kg     Palliative Assessment/Data:   Palliative performance scale 50%  Time In:  11 Time Out:  12 Time Total:  60 Greater than 50%  of this time was spent counseling and coordinating care related to the above assessment and plan.  Signed by: Loistine Chance, MD   Please contact Palliative Medicine Team phone at [phone] for questions and concerns.  For individual provider: See Shea Evans

## 2020-05-31 NOTE — Progress Notes (Signed)
PROGRESS NOTE  Morgan Yu QIO:[id number] DOB: 1940/04/30 DOA: 05/27/2020 PCP: Patient, [name]  Brief History   41yow PMH metastatic breast cancer followed by oncology, on Herceptin and Arimidex, also proceeding w/ palliative radiation, presented w/ bilateral pleural effusion and acute hypoxic resp failure.  Underwent thoracentesis on the left with nearly 1 L fluid removed.  Cytology was unrevealing.  Remained hypoxic on minimal oxygen, repeat imaging demonstrated bilateral pleural effusions, status post right thoracentesis nearly 1 L fluid removed.  Remains hypoxic.  Repeat chest x-ray 4/29 showed reaccumulation of fluid.  Seen by oncology and palliative medicine.  At this point include placement of Pleurx catheter for palliative management of effusions, continue radiation, versus hospice support.  Will continue to monitor as she has high risk for rapid reaccumulation of respiratory distress.  Follow-up with oncology and palliative medicine 5/2 and would anticipate discharge thereafter.  A & P  Acute hypox resp failure secondary to bilateral pleural effusion --s/p thoracentesis 4/26 w/ removal 938mL clear fluid.  Cytology unrevealing. -- Status post thoracentesis 4/28 removal 900 mL clear fluid.  Cytology pending. -- Repeat chest x-ray 4/29 demonstrated reaccumulation of fluid. --Although unproven, presumed malignant in nature. --Remains symptomatic, short of breath at times, at high risk for rapid reaccumulation and respiratory distress --Consider Pleurx catheter  Metastatic breast cancer -- Followed by oncology.  Further input from oncology and palliative medicine 5/2.  Chronic bilateral LE edema -- Presumably secondary to malignancy.  2D echocardiogram normal LVEF.  No wall motion abnormalities.  Left ventricular diastolic parameters indeterminate. -- A little more edema today.  1 dose of IV Lasix.  Increase oral Lasix tomorrow.  Anxiety  --stable  Disposition Plan:   Discussion: High risk for rapid fluid reaccumulation and respiratory distress.  Continue to monitor, continue oxygen therapy, anticipate will need home oxygen.  Likely home 5/2 after further input from oncology and PMT. PT eval  Status is: Inpatient  Remains inpatient appropriate because:Inpatient level of care appropriate due to severity of illness   Dispo: The patient is from: Home              Anticipated d/c is to: Home              Patient currently is not medically stable to d/c.   Difficult to place patient Yes  DVT prophylaxis: SCDs Start: 05/27/20 1919   Code Status: DNR Level of care: Med-Surg Family Communication: none  [name], MD  [facility] Direct contact: see [email or web address] (further directions at bottom of note if needed) 7PM-7AM contact night coverage as at bottom of note 05/31/2020, 2:19 PM  LOS: 4 days   Interval History/Subjective  CC: f/u SOB  Feels rough, some SOB  Objective   Vitals:  Vitals:   05/31/20 0723 05/31/20 1234  BP:  136/62  Pulse:  86  Resp:  20  Temp:  98.3 F (36.8 C)  SpO2: 91% 94%    Exam:  Constitutional:   . Appears calm and comfortable sitting in chair ENMT:  . grossly normal hearing  Respiratory:  . CTA bilaterally, no w/r/r but diminished breath sounds posterior bases . Respiratory effort mildly increased. On 2L [place] Cardiovascular:  . RRR, no m/r/g . 1+ BLE L>R edema   Psychiatric:  . Mental status o Mood, affect appropriate  I have personally reviewed the following:   Today's Data  . No new labs  Scheduled Meds: . anastrozole  1 mg Oral Daily  . [START ON  06/01/2020] furosemide  40 mg Oral Daily  . guaiFENesin  600 mg Oral BID  . multivitamin with minerals  1 tablet Oral Daily   Continuous Infusions:  Principal Problem:   Acute hypoxemic respiratory failure (HCC) Active Problems:   Bone metastases (HCC)   Pleural effusion   Carcinoma of breast metastatic to bone (HCC)   Anxiety state    Bilateral lower extremity edema   LOS: 4 days   How to contact the [facility] Attending or Consulting provider 7A - 7P or covering provider during after hours [place], for this patient?  1. Check the care team in [facility] and look for a) attending/consulting [facility] provider listed and b) the [facility] team listed 2. Log into [email or web address] and use [place]'s universal password to access. If you do not have the password, please contact the hospital operator. 3. Locate the [facility] provider you are looking for under [facility] and page to a number that you can be directly reached. 4. If you still have difficulty reaching the provider, please page the [facility] (Director on Call) for the Hospitalists listed on amion for assistance.

## 2020-06-01 DIAGNOSIS — J9601 Acute respiratory failure with hypoxia: Secondary | ICD-10-CM | POA: Diagnosis not present

## 2020-06-01 DIAGNOSIS — R531 Weakness: Secondary | ICD-10-CM | POA: Diagnosis not present

## 2020-06-01 DIAGNOSIS — Z515 Encounter for palliative care: Secondary | ICD-10-CM | POA: Diagnosis not present

## 2020-06-01 DIAGNOSIS — R600 Localized edema: Secondary | ICD-10-CM | POA: Diagnosis not present

## 2020-06-01 DIAGNOSIS — C50912 Malignant neoplasm of unspecified site of left female breast: Secondary | ICD-10-CM | POA: Diagnosis not present

## 2020-06-01 DIAGNOSIS — J90 Pleural effusion, not elsewhere classified: Secondary | ICD-10-CM | POA: Diagnosis not present

## 2020-06-01 DIAGNOSIS — Z7189 Other specified counseling: Secondary | ICD-10-CM | POA: Diagnosis not present

## 2020-06-01 MED ORDER — TRAZODONE HCL 50 MG PO TABS
50.0000 mg | ORAL_TABLET | Freq: Every day | ORAL | Status: DC
  Administered 2020-06-01: 50 mg via ORAL
  Filled 2020-06-01 (×2): qty 1

## 2020-06-01 NOTE — Plan of Care (Signed)

## 2020-06-01 NOTE — Progress Notes (Signed)
Daily Progress Note   Morgan Yu Name: Morgan Yu       Date: 06/01/2020 DOB: 27-Oct-1940  Age: 80 y.o. MRN#: [id number] Attending Physician: Samuella Cota, MD Primary Care Physician: Morgan Yu, [name] Admit Date: 05/27/2020  Reason for Consultation/Follow-up: Establishing goals of care  Subjective: Morgan Yu is sitting up in a chair.  She is on supplemental oxygen.  Morgan Yu states that she just did not rest well overnight.  She denies any acute dyspnea currently.  Length of Stay: 5  Current Medications: Scheduled Meds:  . anastrozole  1 mg Oral Daily  . furosemide  40 mg Oral Daily  . guaiFENesin  600 mg Oral BID  . multivitamin with minerals  1 tablet Oral Daily  . traZODone  50 mg Oral QHS    Continuous Infusions:   PRN Meds: guaiFENesin-dextromethorphan, loperamide, LORazepam, morphine injection, ondansetron **OR** ondansetron (ZOFRAN) IV, senna, sodium chloride  Physical Exam         Awake alert sitting in a chair On 2 L nasal cannula Diminished breath sounds S1-S2 Trace edema No focal deficits  Vital Signs: BP 135/65 (BP Location: Right Arm)   Pulse 91   Temp 97.8 F (36.6 C) (Oral)   Resp 18   Ht 5\' 1"  (1.549 m)   Wt 48.4 kg   SpO2 91%   BMI 20.16 kg/m  SpO2: SpO2: 91 % O2 Device: O2 Device: Nasal Cannula O2 Flow Rate: O2 Flow Rate (L/min): 2 L/min  Intake/output summary:   Intake/Output Summary (Last 24 hours) at 06/01/2020 1126 Last data filed at 06/01/2020 0843 Gross per 24 hour  Intake 240 ml  Output 1400 ml  Net -1160 ml   LBM: Last BM Date: 06/01/20 Baseline Weight: Weight: 51.5 kg Most recent weight: Weight: 48.4 kg       Palliative Assessment/Data: Palliative performance scale 50%     Morgan Yu Active Problem List    Diagnosis Date Noted  . Acute hypoxemic respiratory failure ([place]) 05/28/2020  . Lymphedema 05/27/2020  . Bilateral lower extremity edema 05/27/2020  . Malignant pleural effusion 05/27/2020  . Anxiety state 05/21/2020  . Carcinoma of breast metastatic to bone ([place]) 03/19/2020  . Protein-calorie malnutrition, severe 03/04/2020  . Pressure injury of skin 02/28/2020  . Bone metastases ([place]) 02/27/2020  . Elevated LFTs 02/27/2020  .  Elevated troponin 02/27/2020  . Pleural effusion 02/27/2020  . Breast mass, left 02/27/2020  . Sinus tachycardia 02/27/2020    Palliative Care Assessment & Plan   Morgan Yu Profile:  Morgan Yu.  Morgan Yu has a cousin Doristine Section who is her healthcare power of attorney agent.  Morgan Yu has a life limiting illness of metastatic breast cancer she is followed by oncology she is on Herceptin and Arimidex.  She is also undergoing palliative radiation.  Morgan Yu presented with bilateral pleural effusions and acute hypoxic respiratory failure.  Morgan Yu's has undergone thoracentesis and extensive fluid removal.  Morgan Yu noted to have rapid reaccumulation of fluid on repeat chest x-ray.  Palliative consultation for goals of care discussions has been requested.  Assessment: Shortness of breath Generalized weakness Undergoing consideration for Pleurx drain placement because of rapidly reaccumulating recurrent pleural effusions likely malignant in etiology Goals of care discussions including hospice discussions are ongoing.  Recommendations/Plan:  I discussed with the Morgan Yu again about her current condition and broad goals of care.  Given more information about home with hospice support.  Morgan Yu already connected with outpatient palliative services at home.  Discussed about how that would be different from home with hospice.  Morgan Yu agreeable to drain placement for recurrent pleural effusions.  Overall, Morgan Yu's goals are  leaning towards establishment of comfort measures and to be in her familiar surroundings at home and she is accepting of hospice support.  She will continue discussions with her cousin healthcare power of attorney agent Baker Janus who is likely to arrive at the hospital tomorrow.  Palliative to follow.     Code Status:    Code Status Orders  (From admission, onward)         Start     Ordered   05/27/20 1919  Do not attempt resuscitation (DNR)  Continuous       Question Answer Comment  In the event of cardiac or respiratory ARREST Do not call a "code blue"   In the event of cardiac or respiratory ARREST Do not perform Intubation, CPR, defibrillation or ACLS   In the event of cardiac or respiratory ARREST Use medication by any route, position, wound care, and other measures to relive pain and suffering. May use oxygen, suction and manual treatment of airway obstruction as needed for comfort.      05/27/20 1918        Code Status History    Date Active Date Inactive Code Status Order ID Comments User Context   02/28/2020 0727 03/05/2020 1705 DNR [id number]  Antonieta Pert, MD ED   02/27/2020 2049 02/28/2020 0727 Full Code [id number]  Vianne Bulls, MD ED   Advance Care Planning Activity    Advance Directive Documentation   Flowsheet Row Most Recent Value  Type of Advance Directive Healthcare Power of Attorney, Living will  Pre-existing out of facility DNR order (yellow form or pink MOST form) --  "MOST" Form in Place? --       Prognosis:   guarded, could be less than 6 months.   Discharge Planning:  Home with hospice is being considered, but not finalized yet. Morgan Yu already connected with home based palliative care from [facility].   Care plan was discussed with Morgan Yu.    Thank you for allowing the Palliative Medicine Team to assist in the care of this Morgan Yu.   Time In: 9.30 Time Out: 10.05 Total Time 35 Prolonged Time Billed No  Greater than 50%  of this time was spent  counseling and coordinating care related to the above assessment and plan.  Loistine Chance, MD  Please contact Palliative Medicine Team phone at [phone] for questions and concerns.

## 2020-06-01 NOTE — Evaluation (Signed)
Occupational Therapy Evaluation Patient Details Name: Morgan Yu MRN: [id number] DOB: 02-24-1940 Today's Date: 06/01/2020    History of Present Illness 76yow PMH metastatic breast cancer followed by oncology, on Herceptin and Arimidex, also proceeding w/ palliative radiation, presented w/ bilateral pleural effusion and acute hypoxic resp failure.  Underwent thoracentesis on the left and right .  Repeat chest x-ray 4/29 showed reaccumulation of fluid.  Seen by oncology and palliative medicine.   Clinical Impression   Pt admitted with the above diagnoses and presents with below problem list. Pt will benefit from continued acute OT to address the below listed deficits and maximize independence with basic ADLs prior to d/c home. PTA pt was mod I with ADLs, supervision for safety with shower transfers. Pt currently is able to walk household distances at supervision level utilizing rw, up to min guard assist for LB ADLs. Next session plan to work on energy conservation strategies for managing ADLs/IADLs at home.    Follow Up Recommendations  Home health OT;Supervision - Intermittent    Equipment Recommendations  None recommended by OT    Recommendations for Other Services       Precautions / Restrictions Precautions Precaution Comments: monitor sats, on O2 Restrictions Weight Bearing Restrictions: No      Mobility Bed Mobility               General bed mobility comments: up in recliner    Transfers Overall transfer level: Needs assistance Equipment used: Rolling walker (2 wheeled) Transfers: Sit to/from Stand Sit to Stand: Supervision         General transfer comment: to/from recliner. extra time but no physical assist    Balance Overall balance assessment: Mild deficits observed, not formally tested                                         ADL either performed or assessed with clinical judgement   ADL Overall ADL's : Needs  assistance/impaired Eating/Feeding: Set up;Sitting   Grooming: Set up;Sitting   Upper Body Bathing: Set up;Sitting   Lower Body Bathing: Min guard;Sit to/from stand   Upper Body Dressing : Set up;Sitting   Lower Body Dressing: Min guard;Sit to/from stand   Toilet Transfer: Supervision/safety   Toileting- Water quality scientist and Hygiene: Min guard;Sit to/from stand   Tub/ Shower Transfer: Supervision/safety;Ambulation;Shower seat;Rolling walker   Functional mobility during ADLs: Supervision/safety;Rolling walker General ADL Comments: Pt with increased cough noted during transfers and mobility. Walked household distance navigating obstacles with no LOB. Would beenfit from energy conservation education next session.     Vision Baseline Vision/History: Wears glasses Wears Glasses: Reading only       Perception     Praxis      Pertinent Vitals/Pain Pain Assessment: No/denies pain     Hand Dominance Right   Extremity/Trunk Assessment Upper Extremity Assessment Upper Extremity Assessment: Generalized weakness;LUE deficits/detail LUE Deficits / Details: LUE edematous, erthema noted. Pt reports this has been "going on for a while." Able to use L hand on walker to grip and propel.   Lower Extremity Assessment Lower Extremity Assessment: Defer to PT evaluation   Cervical / Trunk Assessment Cervical / Trunk Assessment: Kyphotic   Communication Communication Communication: No difficulties   Cognition Arousal/Alertness: Awake/alert Behavior During Therapy: WFL for tasks assessed/performed Overall Cognitive Status: Within Functional Limits for tasks assessed  General Comments  on 2L [place] throughout session    Exercises     Shoulder Instructions      Home Living Family/patient expects to be discharged to:: Private residence Living Arrangements: Alone Available Help at Discharge: Family;Friend(s);Available  PRN/intermittently Type of Home: House Home Access: Stairs to enter [employer] of Steps: 3 Entrance Stairs-Rails: None Home Layout: One level     Bathroom Shower/Tub: Tub/shower unit;Walk-in shower   Bathroom Toilet: Handicapped height     Home Equipment: Walker - 4 wheels   Additional Comments: rollator does not fit into kitchen      Prior Functioning/Environment Level of Independence: Independent        Comments: Independent with household mobility without AD. Does not drive. Completes ADLs/IADLs , recently has food brought in from church but stopped them due to too much, wants to do her own cooking.including light meal prep with I. Pt reports someone is always present during shower transfer for safety, no physical assist needed.        OT Problem List: Decreased strength;Decreased activity tolerance;Impaired balance (sitting and/or standing);Decreased knowledge of use of DME or AE;Decreased knowledge of precautions;Cardiopulmonary status limiting activity;Increased edema      OT Treatment/Interventions: Self-care/ADL training;Therapeutic exercise;Energy conservation;DME and/or AE instruction;Therapeutic activities;Patient/family education;Balance training    OT Goals(Current goals can be found in the care plan section) Acute Rehab OT Goals Patient Stated Goal: go home, take care of myself OT Goal Formulation: With patient Time For Goal Achievement: 06/15/20 Potential to Achieve Goals: Good ADL Goals Pt Will Perform Lower Body Bathing: with modified independence;sit to/from stand Pt Will Perform Lower Body Dressing: with modified independence;sit to/from stand Pt Will Transfer to Toilet: with modified independence;ambulating Pt Will Perform Toileting - Clothing Manipulation and hygiene: with modified independence;sit to/from stand Pt Will Perform Tub/Shower Transfer: with supervision;ambulating;shower seat;rolling walker Additional ADL Goal #1: Pt will  independently verbalize 3 energy conservation strategies to utilize for managing ADLs/IADLs.  OT Frequency: Min 2X/week   Barriers to D/C:            Co-evaluation              AM-PAC OT "6 Clicks" Daily Activity     Outcome Measure Help from another person eating meals?: None Help from another person taking care of personal grooming?: None Help from another person toileting, which includes using toliet, bedpan, or urinal?: None Help from another person bathing (including washing, rinsing, drying)?: A Little Help from another person to put on and taking off regular upper body clothing?: None Help from another person to put on and taking off regular lower body clothing?: A Little 6 Click Score: 22   End of Session Equipment Utilized During Treatment: Rolling walker;Oxygen;Other (comment) (2L [place])  Activity Tolerance: Other (comment);Patient tolerated treatment well (increased coughing with OOB activity) Patient left: in chair;with call bell/phone within reach  OT Visit Diagnosis: Unsteadiness on feet (R26.81);Muscle weakness (generalized) (M62.81)                Time: 2876-8115 OT Time Calculation (min): 10 min Charges:  OT General Charges $OT Visit: 1 Visit OT Evaluation $OT Eval Low Complexity: White, OT Acute Rehabilitation Services Pager: [phone] Office: [phone]   Hortencia Pilar 06/01/2020, 10:30 AM

## 2020-06-01 NOTE — Progress Notes (Signed)
PROGRESS NOTE  Morgan Yu RDE:[id number] DOB: 27-Jun-1940 DOA: 05/27/2020 PCP: Patient, [name]  Brief History   85yow PMH metastatic breast cancer followed by oncology, on Herceptin and Arimidex, also proceeding w/ palliative radiation, presented w/ bilateral pleural effusion and acute hypoxic resp failure.  Underwent thoracentesis on the left with nearly 1 L fluid removed.  Cytology was unrevealing.  Remained hypoxic on minimal oxygen, repeat imaging demonstrated bilateral pleural effusions, status post right thoracentesis nearly 1 L fluid removed.  Remains hypoxic.  Repeat chest x-ray 4/29 showed reaccumulation of fluid.  Seen by oncology and palliative medicine.  At this point include placement of Pleurx catheter for palliative management of effusions, continue radiation, versus hospice support.  Will continue to monitor as she has high risk for rapid reaccumulation of respiratory distress.  Follow-up with oncology and palliative medicine 5/2 and would anticipate discharge thereafter.  A & P  Acute hypox resp failure secondary to bilateral pleural effusion --s/p thoracentesis 4/26 w/ removal 955mL clear fluid.  Cytology unrevealing. -- Status post thoracentesis 4/28 removal 900 mL clear fluid.  Cytology pending. -- Repeat chest x-ray 4/29 demonstrated reaccumulation of fluid. --Although unproven, presumed malignant in nature. -- Remains symptomatic.  We will repeat chest x-ray in a.m.  Plan Pleurx catheter placement tomorrow.  Metastatic breast cancer -- Followed by oncology.  Further input from oncology and palliative medicine 5/2.  Patient not sure whether she wants to continue radiation.  Also considering hospice.  Chronic bilateral LE edema -- Presumably secondary to malignancy.  2D echocardiogram normal LVEF.  No wall motion abnormalities.  Left ventricular diastolic parameters indeterminate. -- Minimal edema.  Continue increased dose of Lasix.  Anxiety   --stable  Disposition Plan:  Discussion: Remains symptomatic, hypoxic without oxygen.  Continue supplemental oxygen.  Repeat chest x-ray tomorrow to assess for fluid accumulation.  Further discussion tomorrow with patient, POA, palliative care and oncology to finalize plan.  Patient willing to pursue Pleurx catheter placement.  Plan for home with home health PT, OT and oxygen when ready.  Status is: Inpatient  Remains inpatient appropriate because:Inpatient level of care appropriate due to severity of illness   Dispo: The patient is from: Home              Anticipated d/c is to: Home              Patient currently is not medically stable to d/c.   Difficult to place patient Yes  DVT prophylaxis: SCDs Start: 05/27/20 1919   Code Status: DNR Level of care: Med-Surg Family Communication: none  [name], MD  [facility] Direct contact: see [email or web address] (further directions at bottom of note if needed) 7PM-7AM contact night coverage as at bottom of note 06/01/2020, 1:52 PM  LOS: 5 days   Interval History/Subjective  CC: f/u SOB  Feels SOB now Struggling w/ decision making "I'm tired of making decisions". Has decided to go through w/ PleurX but not sure if she wants to continue XRT or not.  Objective   Vitals:  Vitals:   06/01/20 0958 06/01/20 1245  BP:  (!) 143/58  Pulse:  78  Resp:  19  Temp:  97.8 F (36.6 C)  SpO2: 91% 93%    Exam:  Constitutional:   . Appears calm, mildly uncomfortable ENMT:  . grossly normal hearing  Respiratory:  . CTA bilaterally anteriorly, diminished posterior bases, no w/r/r.  . Respiratory effort mildly increased, mildly dyspneic.  Cardiovascular:  . RRR, no  m/r/g . minimal BLE extremity edema   Psychiatric:  . Mental status o Mood, affect appropriate  I have personally reviewed the following:   Today's Data  . UOP 1600  Scheduled Meds: . anastrozole  1 mg Oral Daily  . furosemide  40 mg Oral Daily  . guaiFENesin   600 mg Oral BID  . multivitamin with minerals  1 tablet Oral Daily  . traZODone  50 mg Oral QHS   Continuous Infusions:  Principal Problem:   Acute hypoxemic respiratory failure (HCC) Active Problems:   Bone metastases (HCC)   Pleural effusion   Carcinoma of breast metastatic to bone (HCC)   Anxiety state   Bilateral lower extremity edema   LOS: 5 days   How to contact the [facility] Attending or Consulting provider [place] or covering provider during after hours [place], for this patient?  1. Check the care team in [facility] and look for a) attending/consulting TRH provider listed and b) the [facility] team listed 2. Log into [email or web address] and use [place]'s universal password to access. If you do not have the password, please contact the hospital operator. 3. Locate the [place] provider you are looking for under [facility] and page to a number that you can be directly reached. 4. If you still have difficulty reaching the provider, please page the [facility] (Director on Call) for the Hospitalists listed on amion for assistance.

## 2020-06-01 NOTE — Plan of Care (Signed)
  Problem: Skin Integrity: Goal: Risk for impaired skin integrity will decrease Outcome: Progressing   Problem: Safety: Goal: Ability to remain free from injury will improve Outcome: Progressing   Problem: Pain Managment: Goal: General experience of comfort will improve Outcome: Progressing   Problem: Elimination: Goal: Will not experience complications related to bowel motility Outcome: Progressing Goal: Will not experience complications related to urinary retention Outcome: Progressing   Problem: Coping: Goal: Level of anxiety will decrease Outcome: Progressing

## 2020-06-01 NOTE — Progress Notes (Signed)
SATURATION QUALIFICATIONS: (This note is used to comply with regulatory documentation for home oxygen)  Patient Saturations on Room Air at Rest = 87%  Patient Saturations on Room Air while Ambulating = 85%  Patient Saturations on 3 Liters of oxygen while Ambulating = 92%  Please briefly explain why patient needs home oxygen: Desaturation on room air at rest and with exertion.

## 2020-06-01 NOTE — TOC Initial Note (Addendum)
Transition of Care [facility]) - Initial/Assessment Note    Patient Details  Name: Morgan Yu MRN: [id number] Date of Birth: Jul 24, 1940  Transition of [facility]) CM/SW Contact:    Elliot Gurney [place],  Phone Number: [phone] 06/01/2020, 2:50 PM  Clinical Narrative:                 Patient is a 80 year old female, PMH of metastatic breat cancer. Patient lives alone, reports currently being followed by the cancer center. Per patient, she is active with [facility] and received PT and OT. Patient has a walker, raised toilet seat, and bedside commode. Patient has transportation to her appointments and [facility] [place] to meet her [facility] needs. Voicemail left for Lattie Haw at [facility] regarding patient's [place] needs. Oxygen to be ordered through [employer] once saturation note is obtained.  3:49pm Oxygen ordered through [employer]-spoke with Jodell Cipro [phone].  Quince Santana, LCSW Transition of Care [phone]    Expected Discharge Plan: [place] Barriers to Discharge: Continued Medical Work up   Patient Goals and CMS Choice Patient states their goals for this hospitalization and ongoing recovery are:: "I would like to get the fluid drained off my lungs"      Expected Discharge Plan and Services Expected Discharge Plan: [place] In-house Referral: Clinical Social Work   Post Acute Care Choice: [place] arrangements for the past 2 months: [place]: PT,OT [place] Agency: [facility] Date [place] Agency Contacted: 06/01/20 Time HH Agency Contacted: [phone] Representative spoke with at [place]: Ricki Miller  Prior Living Arrangements/Services Living arrangements for the past 2 months: [place] with:: Self Patient language and need for interpreter reviewed:: No Do you feel safe going back to the place where you live?: Yes      Need for Family Participation in Patient  Care: No (Comment) Care giver support system in place?: Yes (comment) Current home services: DME Criminal Activity/Legal Involvement Pertinent to Current Situation/Hospitalization: No - Comment as needed  Activities of Daily Living Home Assistive Devices/Equipment: Wheelchair,Walker (specify type),Eyeglasses ADL Screening (condition at time of admission) Patient's cognitive ability adequate to safely complete daily activities?: Yes Is the patient deaf or have difficulty hearing?: No Does the patient have difficulty seeing, even when wearing glasses/contacts?: No Does the patient have difficulty concentrating, remembering, or making decisions?: No Patient able to express need for assistance with ADLs?: Yes Does the patient have difficulty dressing or bathing?: No Independently performs ADLs?: No Communication: Independent Dressing (OT): Independent Grooming: Independent Feeding: Independent Bathing: Independent Toileting: Needs assistance Is this a change from baseline?: Pre-admission baseline In/Out Bed: Needs assistance Is this a change from baseline?: Pre-admission baseline Walks in Home: Needs assistance Is this a change from baseline?: Pre-admission baseline Does the patient have difficulty walking or climbing stairs?: Yes Weakness of Legs: Both Weakness of Arms/Hands: Both  Permission Sought/Granted                  Emotional Assessment       Orientation: : Oriented to Self,Oriented to Place,Oriented to  Time,Oriented to Situation Alcohol / Substance Use: Not Applicable Psych Involvement: No (comment)  Admission diagnosis:  Pleural effusion [J90] Malignant pleural effusion [J91.0] Pleural effusion, left [J90] Patient Active Problem List   Diagnosis Date Noted  .  Acute hypoxemic respiratory failure ([place]) 05/28/2020  . Lymphedema 05/27/2020  . Bilateral lower extremity edema 05/27/2020  . Malignant pleural effusion 05/27/2020  . Anxiety state 05/21/2020  .  Carcinoma of breast metastatic to bone ([place]) 03/19/2020  . Protein-calorie malnutrition, severe 03/04/2020  . Pressure injury of skin 02/28/2020  . Bone metastases ([place]) 02/27/2020  . Elevated LFTs 02/27/2020  . Elevated troponin 02/27/2020  . Pleural effusion 02/27/2020  . Breast mass, left 02/27/2020  . Sinus tachycardia 02/27/2020   PCP:  Patient, [name] Pharmacy:   [id number] [place] [place] - [address] [place] [address] Phone: [phone] Fax: [phone]     Social Determinants of Health (SDOH) Interventions    Readmission Risk Interventions No flowsheet data found.

## 2020-06-02 ENCOUNTER — Other Ambulatory Visit: Payer: Self-pay | Admitting: Hematology and Oncology

## 2020-06-02 ENCOUNTER — Ambulatory Visit
Admission: RE | Admit: 2020-06-02 | Discharge: 2020-06-02 | Disposition: A | Discharge status: Home / Self Care | Payer: Medicare HMO | Source: Ambulatory Visit | Attending: Radiation Oncology | Admitting: Radiation Oncology

## 2020-06-02 ENCOUNTER — Inpatient Hospital Stay (HOSPITAL_COMMUNITY): Payer: Medicare HMO

## 2020-06-02 DIAGNOSIS — C7951 Secondary malignant neoplasm of bone: Secondary | ICD-10-CM | POA: Diagnosis not present

## 2020-06-02 DIAGNOSIS — C773 Secondary and unspecified malignant neoplasm of axilla and upper limb lymph nodes: Secondary | ICD-10-CM | POA: Insufficient documentation

## 2020-06-02 DIAGNOSIS — Z510 Encounter for antineoplastic radiation therapy: Secondary | ICD-10-CM | POA: Insufficient documentation

## 2020-06-02 DIAGNOSIS — J9601 Acute respiratory failure with hypoxia: Secondary | ICD-10-CM | POA: Diagnosis not present

## 2020-06-02 DIAGNOSIS — C50912 Malignant neoplasm of unspecified site of left female breast: Secondary | ICD-10-CM | POA: Insufficient documentation

## 2020-06-02 DIAGNOSIS — J90 Pleural effusion, not elsewhere classified: Secondary | ICD-10-CM | POA: Diagnosis not present

## 2020-06-02 LAB — CYTOLOGY - NON PAP

## 2020-06-02 MED ORDER — CEFAZOLIN SODIUM-DEXTROSE 2-4 GM/100ML-% IV SOLN
2.0000 g | INTRAVENOUS | Status: AC
  Filled 2020-06-02: qty 100

## 2020-06-02 NOTE — Progress Notes (Signed)
Notified on call provider that patient refused Trazodone. Pt stated, "My head still feels crazy from the Trazodone they gave me last night". Patient also stated that the PRN Ativan works just as well. Gave PRN Ativan with nighttime meds. Followed-up with patient, and patient still refused the Trazodone. Just wanted to let the on call provider know.

## 2020-06-02 NOTE — Progress Notes (Signed)
Daily Progress Note   Patient Name: Morgan Yu       Date: 06/02/2020 DOB: 04-29-1940  Age: 80 y.o. MRN#: [id number] Attending Physician: Samuella Cota, MD Primary Care Physician: Patient, [name] Admit Date: 05/27/2020  Reason for Consultation/Follow-up: Establishing goals of care  Subjective: I met today with patient and her cousin, Morgan Yu.    We reviewed clinical course and plan for pleurex.  She is comfortable with this plan, but reports that she is still not sure of pursuing further radiation following pleurex placement.  We discussed potential benefits and burdens.  She feels that she needs to focus on getting pleurex and then reassessing further XRT after this.  Length of Stay: 6  Current Medications: Scheduled Meds:  . anastrozole  1 mg Oral Daily  . furosemide  40 mg Oral Daily  . guaiFENesin  600 mg Oral BID  . multivitamin with minerals  1 tablet Oral Daily  . traZODone  50 mg Oral QHS    Continuous Infusions: . [START ON 06/03/2020]  ceFAZolin (ANCEF) IV      PRN Meds: guaiFENesin-dextromethorphan, loperamide, LORazepam, ondansetron **OR** ondansetron (ZOFRAN) IV, senna, sodium chloride  Physical Exam         Awake alert sitting in a chair On 2 L nasal cannula Diminished breath sounds S1-S2 Trace edema No focal deficits  Vital Signs: BP (!) 117/48 (BP Location: Right Arm)   Pulse 66   Temp 97.9 F (36.6 C) (Oral)   Resp 17   Ht 5' 1"  (1.549 m)   Wt 48.4 kg   SpO2 95%   BMI 20.16 kg/m  SpO2: SpO2: 95 % O2 Device: O2 Device: Nasal Cannula O2 Flow Rate: O2 Flow Rate (L/min): 3 L/min  Intake/output summary:   Intake/Output Summary (Last 24 hours) at 06/02/2020 2254 Last data filed at 06/02/2020 2200 Gross per 24 hour  Intake 50 ml   Output 800 ml  Net -750 ml   LBM: Last BM Date: 06/01/20 Baseline Weight: Weight: 51.5 kg Most recent weight: Weight: 48.4 kg       Palliative Assessment/Data: Palliative performance scale 50%     Patient Active Problem List   Diagnosis Date Noted  . Acute hypoxemic respiratory failure ([place]) 05/28/2020  . Lymphedema 05/27/2020  . Bilateral lower extremity edema 05/27/2020  .  Malignant pleural effusion 05/27/2020  . Anxiety state 05/21/2020  . Carcinoma of breast metastatic to bone ([place]) 03/19/2020  . Protein-calorie malnutrition, severe 03/04/2020  . Pressure injury of skin 02/28/2020  . Bone metastases ([place]) 02/27/2020  . Elevated LFTs 02/27/2020  . Elevated troponin 02/27/2020  . Pleural effusion 02/27/2020  . Breast mass, left 02/27/2020  . Sinus tachycardia 02/27/2020    Palliative Care Assessment & Plan   Patient Profile:  80 year old lady who lives by herself in [place], [place].  Patient has a cousin Morgan Yu who is her healthcare power of attorney agent.  Patient has a life limiting illness of metastatic breast cancer she is followed by oncology she is on Herceptin and Arimidex.  She is also undergoing palliative radiation.  Patient presented with bilateral pleural effusions and acute hypoxic respiratory failure.  Patient's has undergone thoracentesis and extensive fluid removal.  Patient noted to have rapid reaccumulation of fluid on repeat chest x-ray.  Palliative consultation for goals of care discussions has been requested.  Assessment: Shortness of breath Generalized weakness Undergoing consideration for Pleurx drain placement because of rapidly reaccumulating recurrent pleural effusions likely malignant in etiology Goals of care discussions including hospice discussions are ongoing.  Recommendations/Plan:  Plan for pleurex placement  She would like to discuss further with oncology regarding next steps.  She is still considering if she would like  to pursue further XRT.     Code Status:    Code Status Orders  (From admission, onward)         Start     Ordered   05/27/20 1919  Do not attempt resuscitation (DNR)  Continuous       Question Answer Comment  In the event of cardiac or respiratory ARREST Do not call a "code blue"   In the event of cardiac or respiratory ARREST Do not perform Intubation, CPR, defibrillation or ACLS   In the event of cardiac or respiratory ARREST Use medication by any route, position, wound care, and other measures to relive pain and suffering. May use oxygen, suction and manual treatment of airway obstruction as needed for comfort.      05/27/20 1918        Code Status History    Date Active Date Inactive Code Status Order ID Comments User Context   02/28/2020 0727 03/05/2020 1705 DNR [id number]  Antonieta Pert, MD ED   02/27/2020 2049 02/28/2020 0727 Full Code [id number]  Vianne Bulls, MD ED   Advance Care Planning Activity    Advance Directive Documentation   Flowsheet Row Most Recent Value  Type of Advance Directive Healthcare Power of Attorney, Living will  Pre-existing out of facility DNR order (yellow form or pink MOST form) --  "MOST" Form in Place? --       Prognosis:   guarded, could be less than 6 months.   Discharge Planning:  Home with hospice is being considered, but not finalized yet. Patient already connected with [facility] based palliative care from [facility].   Care plan was discussed with patient.    Thank you for allowing the Palliative Medicine Team to assist in the care of this patient.   Total Time 30 Prolonged Time Billed No       Greater than 50%  of this time was spent counseling and coordinating care related to the above assessment and plan.  Micheline Rough, MD  Please contact Palliative Medicine Team phone at [phone] for questions and concerns.

## 2020-06-02 NOTE — Progress Notes (Signed)
PROGRESS NOTE  Morgan Yu ERD:[id number] DOB: [birth year] DOA: 05/27/2020 PCP: Patient, [name]  Brief History   51yow PMH metastatic breast cancer followed by oncology, on Herceptin and Arimidex, also proceeding w/ palliative radiation, presented w/ bilateral pleural effusion and acute hypoxic resp failure.  Underwent thoracentesis on the left with nearly 1 L fluid removed.  Cytology was unrevealing.  Remained hypoxic on minimal oxygen, repeat imaging demonstrated bilateral pleural effusions, status post right thoracentesis nearly 1 L fluid removed.  Remains hypoxic.  Repeat chest x-ray 4/29 showed reaccumulation of fluid.  Seen by oncology and palliative medicine.  Plan for left Pleurx catheter placement 5/3 and likely discharge home with home health.  A & P  Acute hypox resp failure secondary to bilateral pleural effusion --s/p thoracentesis 4/26 w/ removal 932mL clear fluid.  Cytology unrevealing. -- Status post thoracentesis 4/28 removal 900 mL clear fluid.  Cytology pending. -- Repeat chest x-ray 4/29 demonstrated reaccumulation of fluid. --Although unproven, presumed malignant in nature. -- Remains symptomatic.  Chest x-ray today shows increasing left pleural effusion.  Plan for Pleurx catheter tomorrow.  Metastatic breast cancer -- Followed by oncology.  Reached out to oncology, no response yet although the message was seen.  Appreciate palliative medicine.  Chronic bilateral LE edema -- Presumably secondary to malignancy.  2D echocardiogram normal LVEF.  No wall motion abnormalities.  Left ventricular diastolic parameters indeterminate. -- No significant edema.  Anxiety  --stable  Disposition Plan:  Discussion: Remains symptomatic, requiring oxygen.  Pleurx catheter tomorrow.  Likely home with home oxygen and home health thereafter.  Status is: Inpatient  Remains inpatient appropriate because:Inpatient level of care appropriate due to severity of  illness   Dispo: The patient is from: Home              Anticipated d/c is to: Home              Patient currently is not medically stable to d/c.   Difficult to place patient Yes  DVT prophylaxis: SCDs Start: 05/27/20 1919   Code Status: DNR Level of care: Med-Surg Family Communication: none  [name], MD  [facility] Direct contact: see [email or web address] (further directions at bottom of note if needed) 7PM-7AM contact night coverage as at bottom of note 06/02/2020, 6:29 PM  LOS: 6 days   Interval History/Subjective  CC: f/u SOB  Support short of breath.  On 3 L oxygen.  Objective   Vitals:  Vitals:   06/02/20 0539 06/02/20 1254  BP: (!) 156/51 (!) 117/48  Pulse: 90 66  Resp: 18 17  Temp: (!) 97.5 F (36.4 C) 97.9 F (36.6 C)  SpO2: 90% 95%    Exam:  Constitutional:   . Appears calm and comfortable ENMT:  . grossly normal hearing  Respiratory:  . CTA bilaterally, no w/r/r.  . Respiratory effort mildly increased. Cardiovascular:  . RRR, no m/r/g . No sig LE extremity edema   Psychiatric:  . Mental status o Mood, affect appropriate  I have personally reviewed the following:   Today's Data  . UOP 925 . -3.3L since admission . CXR increasing L pleural effusion  Scheduled Meds: . anastrozole  1 mg Oral Daily  . furosemide  40 mg Oral Daily  . guaiFENesin  600 mg Oral BID  . multivitamin with minerals  1 tablet Oral Daily  . traZODone  50 mg Oral QHS   Continuous Infusions: . [START ON 06/03/2020]  ceFAZolin (ANCEF) IV  Principal Problem:   Acute hypoxemic respiratory failure (HCC) Active Problems:   Bone metastases (HCC)   Pleural effusion   Carcinoma of breast metastatic to bone [facility])   Anxiety state   Bilateral lower extremity edema   LOS: 6 days   How to contact the [facility] Attending or Consulting provider [place] or covering provider during after hours [place], for this patient?  1. Check the care team in [facility] and look for a)  attending/consulting TRH provider listed and b) the [facility] team listed 2. Log into [email or web address] and use [place]'s universal password to access. If you do not have the password, please contact the hospital operator. 3. Locate the [facility] provider you are looking for under [facility] and page to a number that you can be directly reached. 4. If you still have difficulty reaching the provider, please page the [facility] (Director on Call) for the Hospitalists listed on amion for assistance.

## 2020-06-02 NOTE — Progress Notes (Signed)
Referring Physician(s): Goodrich,D/Iruku,P  Supervising Physician: Sandi Mariscal  Patient Status:  Morgan Yu - In-pt  Chief Complaint:  Metastatic breast cancer, recurrent symptomatic pleural effusions  Subjective: Patient familiar to IR service from left axillary mass biopsy on 02/29/2020, left thoracentesis on 05/27/2020 yielding 950 cc and right thoracentesis on 05/30/2020 yielding 900 cc, cytology pending.  She has a history of metastatic breast carcinoma, on Herceptin and Arimidex as well as radiation therapy . She recently presented with symptomatic bilateral pleural effusions and respiratory failure.  Request now received from primary care team and oncology for Pleurx catheter placement.  Patient currently denies fever, headache, chest pain, abdominal pain, back pain, nausea, vomiting or bleeding.  She does have dyspnea, occasional cough, left arm and bilateral lower extremity swelling.  Her breathing is somewhat improved after most recent thoracentesis.  Past Medical History:  Diagnosis Date  . Breast cancer (Morgan Yu)    with bone metastasis   History reviewed. No pertinent surgical history.   Allergies: Vicodin hp [hydrocodone-acetaminophen]  Medications: Prior to Admission medications   Medication Sig Start Date End Date Taking? Authorizing Provider  acetaminophen (TYLENOL) 500 MG tablet Take 500 mg by mouth every 6 (six) hours as needed for moderate pain.   Yes [provider]  anastrozole (ARIMIDEX) 1 MG tablet TAKE 1 TABLET BY MOUTH EVERY DAY Patient taking differently: Take 1 mg by mouth daily. 05/05/20  Yes Iruku, Arletha Pili, MD  Chlorphen-Pseudoephed-APAP (TYLENOL ALLERGY SINUS PO) Take 1 tablet by mouth daily as needed (allergy).   Yes [provider]  furosemide (LASIX) 20 MG tablet TAKE 1 TABLET BY MOUTH EVERY DAY Patient taking differently: Take 20 mg by mouth daily. 05/06/20  Yes Iruku, Arletha Pili, MD  LORazepam (ATIVAN) 0.5 MG tablet Take 1 tablet (0.5 mg  total) by mouth every 8 (eight) hours as needed for anxiety. Take 1 hour prior to radiation therapy Patient taking differently: Take 0.5 mg by mouth every 8 (eight) hours as needed for anxiety. 05/21/20  Yes Gery Pray, MD  oxymetazoline (AFRIN) 0.05 % nasal spray Place 1 spray into both nostrils daily as needed for congestion.   Yes [provider]  senna (SENOKOT) 8.6 MG TABS tablet Take 8.6 mg by mouth daily as needed for mild constipation.   Yes [provider]  feeding supplement (ENSURE ENLIVE / ENSURE PLUS) LIQD Take 237 mLs by mouth 3 (three) times daily between meals. Patient not taking: No sig reported 03/05/20   Antonieta Pert, MD  mirtazapine (REMERON) 15 MG tablet Take 1 tablet (15 mg total) by mouth at bedtime. Patient not taking: Reported on 05/27/2020 03/05/20 04/04/20  Antonieta Pert, MD  ondansetron (ZOFRAN) 4 MG tablet Take 1 tablet (4 mg total) by mouth every 8 (eight) hours as needed for nausea or vomiting. Patient not taking: No sig reported 03/24/20   Harle Stanford., PA-C     Vital Signs: BP (!) 156/51 (BP Location: Right Arm)   Pulse 90   Temp (!) 97.5 F (36.4 C) (Oral)   Resp 18   Ht 5\' 1"  (1.549 m)   Wt 106 lb 11.2 oz (48.4 kg)   SpO2 90%   BMI 20.16 kg/m   Physical Exam awake, alert.  Chest with diminished breath sounds bases.  Heart with regular rate and rhythm.  Abdomen soft, positive bowel sounds, nontender.  Bilateral pretibial edema as well as left upper extremity edema noted.  Imaging: DG Chest 2 View  Result Date: 05/30/2020 CLINICAL DATA:  Dyspnea  EXAM: CHEST - 2 VIEW COMPARISON:  05/29/2020 FINDINGS: Increased moderate left pleural effusion with adjacent atelectasis. Increased mild right pleural effusion with adjacent atelectasis. There may be superimposed consolidation. Similar cardiomediastinal contours. Sclerotic osseous metastatic disease. IMPRESSION: Increased left greater than right pleural effusions with adjacent atelectasis/consolidation.  Electronically Signed   By: Macy Mis M.D.   On: 05/30/2020 11:40   DG Chest Port 1 View  Result Date: 05/29/2020 CLINICAL DATA:  80 year old female with bilateral pleural effusions status post right-sided thoracentesis. EXAM: PORTABLE CHEST 1 VIEW COMPARISON:  Earlier radiograph dated 05/29/2020 FINDINGS: Interval decrease in the size of the right pleural effusion. There is a small to moderate left pleural effusion and associated left lung base atelectasis. Pneumonia is not excluded. No pneumothorax. Stable cardiomediastinal silhouette. Osteopenia with degenerative changes of the spine. Sclerotic bone metastasis as seen on the earlier radiograph. Old right rib fractures. No acute osseous pathology. IMPRESSION: 1. Interval decrease in the size of the right pleural effusion. No pneumothorax. 2. Small to moderate left pleural effusion and associated left lung base atelectasis. Electronically Signed   By: Anner Crete M.D.   On: 05/29/2020 16:28   US THORACENTESIS ASP PLEURAL SPACE W/IMG GUIDE  Result Date: 05/30/2020 INDICATION: Bilateral pleural effusion. Request for therapeutic and diagnostic thoracentesis. EXAM: ULTRASOUND GUIDED RIGHT THORACENTESIS MEDICATIONS: 10 mL 1% lidocaine COMPLICATIONS: None immediate. PROCEDURE: An ultrasound guided thoracentesis was thoroughly discussed with the patient and questions answered. The benefits, risks, alternatives and complications were also discussed. The patient understands and wishes to proceed with the procedure. Written consent was obtained. Ultrasound was performed to localize and mark an adequate pocket of fluid in the right chest. The area was then prepped and draped in the normal sterile fashion. 1% Lidocaine was used for local anesthesia. Under ultrasound guidance a 6 Fr Safe-T-Centesis catheter was introduced. Thoracentesis was performed. The catheter was removed and a dressing applied. FINDINGS: A total of approximately 900 cc of clear yellow  fluid was removed. Samples were sent to the laboratory as requested by the clinical team. Post procedure chest X-ray reviewed, negative for pneumothorax. IMPRESSION: Successful ultrasound guided right thoracentesis yielding 900 cc of pleural fluid. Read by: Durenda Guthrie, PA-C Electronically Signed   By: Markus Daft M.D.   On: 05/30/2020 07:47    Labs:  CBC: Recent Labs    05/19/20 1353 05/27/20 1321 05/27/20 1645 05/28/20 0341  WBC 6.4 8.8 9.9 7.9  HGB 11.5* 12.0 12.3 10.7*  HCT 36.7 38.1 39.6 35.0*  PLT 350 326 350 280    COAGS: Recent Labs    02/29/20 0515  INR 1.0    BMP: Recent Labs    05/19/20 1353 05/27/20 1645 05/28/20 0341 05/30/20 0351  NA 145 137 141 140  K 3.2* 3.9 3.9 3.7  CL 107 102 106 103  CO2 25 26 25 28   GLUCOSE 121* 130* 174* 158*  BUN 17 14 19 22   CALCIUM 8.7* 8.5* 7.4* 7.8*  CREATININE 0.69 0.39* 0.40* 0.53  GFRNONAA >60 >60 >60 >60    LIVER FUNCTION TESTS: Recent Labs    03/06/20 1528 03/24/20 0824 05/27/20 1645 05/28/20 0341  BILITOT 1.3* 1.2 0.5 0.2*  AST 181* 184* 37 27  ALT 33 28 31 21   ALKPHOS 1,299* 1,262* 360* 286*  PROT 6.3* 6.3* 7.5 5.6*  ALBUMIN 2.2* 2.0* 3.6 2.8*    Assessment and Plan: Patient familiar to IR service from left axillary mass biopsy on 02/29/2020, left thoracentesis on 05/27/2020 yielding 950 cc  and right thoracentesis on 05/30/2020 yielding 900 cc, cytology pending.  She has a history of metastatic breast carcinoma, on Herceptin and Arimidex as well as radiation therapy . She recently presented with symptomatic bilateral pleural effusions and respiratory failure.  Request now received from primary care team and oncology for Pleurx catheter placement.  Case has been reviewed by Dr. Earleen Newport.  Details/risks of procedure, including but not limited to, internal bleeding, infection, injury to adjacent structures/pneumothorax discussed with patient with her understanding and consent.  Plan to proceed with left Pleurx catheter  placement on 5/3.  Need for possible right-sided Pleurx will be determined by patient's clinical response after left pleurx.    Electronically Signed: D. Rowe Robert, PA-C 06/02/2020, 10:39 AM   I spent a total of 25 minutes at the the patient's bedside AND on the patient's hospital floor or unit, greater than 50% of which was counseling/coordinating care for image guided left Pleurx catheter placement    Patient ID: Morgan Yu, female   DOB: 09/01/1940, 80 y.o.   MRN: [id number]

## 2020-06-02 NOTE — Progress Notes (Signed)
I met Ms Flippo today along with Ms Edd Fabian. She is willing to proceed with Pleurex catheter placement. Please re send cytology from the pleural fluid sample if possible after tomorrow's procedure. She wanted to speak to Dr Sondra Come to decide if she would like to continue radiation. Her BLE swelling has significantly improved and she will be discharged home with oxygen. I will arrange for outpatient FU upon discharge. If she continues to do well with pleurex, we will continue herceptin and arimidex.

## 2020-06-02 NOTE — Progress Notes (Signed)
Physical Therapy Treatment Patient Details Name: Morgan Yu MRN: [id number] DOB: May 26, 1940 Today's Date: 06/02/2020    History of Present Illness 32yow PMH metastatic breast cancer followed by oncology, on Herceptin and Arimidex, also proceeding w/ palliative radiation, presented w/ bilateral pleural effusion and acute hypoxic resp failure.  Underwent thoracentesis on the left and right .  Repeat chest x-ray 4/29 showed reaccumulation of fluid.  Seen by oncology and palliative medicine.    PT Comments    Limited activity other than to BR per patient due to medication  Has made her lethargic. Assisted patien back to bed with supervision. Continue PT as tolerated.   Follow Up Recommendations  Home health PT     Equipment Recommendations  None recommended by PT    Recommendations for Other Services       Precautions / Restrictions Precautions Precautions: Fall Precaution Comments: monitor sats, on O2    Mobility  Bed Mobility Overal bed mobility: Modified Independent                  Transfers Overall transfer level: Needs assistance Equipment used: None Transfers: Sit to/from [employer] transfer comment: stood from recliner to pivot to bed, holding bed rail  Ambulation/Gait             General Gait Details: NT, too fatigued   Stairs             Wheelchair Mobility    Modified Rankin (Stroke Patients Only)       Balance Overall balance assessment: Mild deficits observed, not formally tested                                          Cognition Arousal/Alertness: Awake/alert Behavior During Therapy: WFL for tasks assessed/performed Overall Cognitive Status: Within Functional Limits for tasks assessed                                 General Comments: states she is too sleepy to do any more, had a  pill last night      Exercises      General Comments         Pertinent Vitals/Pain Pain Assessment: No/denies pain    Home Living                      Prior Function            PT Goals (current goals can now be found in the care plan section) Progress towards PT goals: Progressing toward goals    Frequency    Min 3X/week      PT Plan Current plan remains appropriate    Co-evaluation              AM-PAC PT "6 Clicks" Mobility   Outcome Measure  Help needed turning from your back to your side while in a flat bed without using bedrails?: None Help needed moving from lying on your back to sitting on the side of a flat bed without using bedrails?: None Help needed moving to and from a bed to a chair (including a wheelchair)?: A Little Help needed standing up from a chair using your arms (e.g., wheelchair or bedside chair)?: A Little Help needed to  walk in hospital room?: A Little Help needed climbing 3-5 steps with a railing? : A Lot 6 Click Score: 19    End of Session Equipment Utilized During Treatment: Oxygen Activity Tolerance: Patient limited by fatigue Patient left: in bed;with call bell/phone within reach;with family/visitor present;with bed alarm set Nurse Communication: Mobility status PT Visit Diagnosis: Unsteadiness on feet (R26.81);Difficulty in walking, not elsewhere classified (R26.2)     Time: 2446-2863 PT Time Calculation (min) (ACUTE ONLY): 11 min  Charges:    None/ visit only                         Claretha Cooper 06/02/2020, 2:02 PM  [place] Pager [phone] Office [phone]

## 2020-06-02 NOTE — Telephone Encounter (Signed)
Rx Refill request

## 2020-06-02 NOTE — Care Management Important Message (Signed)
Important Message  Patient Details IM Letter given to the Patient. Name: Morgan Yu MRN: [id number] Date of Birth: May 06, 1940   Medicare Important Message Given:  Yes     Kerin Salen 06/02/2020, 11:23 AM

## 2020-06-02 NOTE — Plan of Care (Signed)

## 2020-06-03 ENCOUNTER — Telehealth: Payer: Self-pay | Admitting: Hematology and Oncology

## 2020-06-03 ENCOUNTER — Ambulatory Visit
Admission: RE | Admit: 2020-06-03 | Discharge: 2020-06-03 | Disposition: A | Discharge status: Home / Self Care | Payer: Medicare HMO | Source: Ambulatory Visit | Attending: Radiation Oncology | Admitting: Radiation Oncology

## 2020-06-03 ENCOUNTER — Inpatient Hospital Stay (HOSPITAL_COMMUNITY): Payer: Medicare HMO

## 2020-06-03 DIAGNOSIS — J90 Pleural effusion, not elsewhere classified: Secondary | ICD-10-CM | POA: Diagnosis not present

## 2020-06-03 DIAGNOSIS — C7951 Secondary malignant neoplasm of bone: Secondary | ICD-10-CM | POA: Diagnosis not present

## 2020-06-03 DIAGNOSIS — J9601 Acute respiratory failure with hypoxia: Secondary | ICD-10-CM | POA: Diagnosis not present

## 2020-06-03 DIAGNOSIS — C50912 Malignant neoplasm of unspecified site of left female breast: Secondary | ICD-10-CM | POA: Diagnosis not present

## 2020-06-03 HISTORY — PX: IR PERC PLEURAL DRAIN W/INDWELL CATH W/IMG GUIDE: IMG5383

## 2020-06-03 LAB — CBC WITH DIFFERENTIAL/PLATELET
Abs Immature Granulocytes: 0.12 10*3/uL — ABNORMAL HIGH (ref 0.00–0.07)
Basophils Absolute: 0.1 10*3/uL (ref 0.0–0.1)
Basophils Relative: 1 %
Eosinophils Absolute: 0 10*3/uL (ref 0.0–0.5)
Eosinophils Relative: 0 %
HCT: 35.9 % — ABNORMAL LOW (ref 36.0–46.0)
Hemoglobin: 10.9 g/dL — ABNORMAL LOW (ref 12.0–15.0)
Immature Granulocytes: 1 %
Lymphocytes Relative: 10 %
Lymphs Abs: 0.9 10*3/uL (ref 0.7–4.0)
MCH: 28.5 pg (ref 26.0–34.0)
MCHC: 30.4 g/dL (ref 30.0–36.0)
MCV: 94 fL (ref 80.0–100.0)
Monocytes Absolute: 1.7 10*3/uL — ABNORMAL HIGH (ref 0.1–1.0)
Monocytes Relative: 19 %
Neutro Abs: 6.1 10*3/uL (ref 1.7–7.7)
Neutrophils Relative %: 69 %
Platelets: 299 10*3/uL (ref 150–400)
RBC: 3.82 MIL/uL — ABNORMAL LOW (ref 3.87–5.11)
RDW: 15.6 % — ABNORMAL HIGH (ref 11.5–15.5)
WBC: 8.8 10*3/uL (ref 4.0–10.5)
nRBC: 0 % (ref 0.0–0.2)

## 2020-06-03 LAB — PROTIME-INR
INR: 1 (ref 0.8–1.2)
Prothrombin Time: 12.9 seconds (ref 11.4–15.2)

## 2020-06-03 MED ORDER — LIDOCAINE-EPINEPHRINE 1 %-1:100000 IJ SOLN
INTRAMUSCULAR | Status: AC
  Filled 2020-06-03: qty 1

## 2020-06-03 MED ORDER — MIDAZOLAM HCL 2 MG/2ML IJ SOLN
INTRAMUSCULAR | Status: AC
  Filled 2020-06-03: qty 4

## 2020-06-03 MED ORDER — FENTANYL CITRATE (PF) 100 MCG/2ML IJ SOLN
INTRAMUSCULAR | Status: AC | PRN
  Administered 2020-06-03 (×2): 50 ug via INTRAVENOUS

## 2020-06-03 MED ORDER — MIDAZOLAM HCL 2 MG/2ML IJ SOLN
INTRAMUSCULAR | Status: AC | PRN
  Administered 2020-06-03: 1 mg via INTRAVENOUS

## 2020-06-03 MED ORDER — LIDOCAINE-EPINEPHRINE 1 %-1:100000 IJ SOLN
INTRAMUSCULAR | Status: AC | PRN
  Administered 2020-06-03: 10 mL

## 2020-06-03 MED ORDER — FENTANYL CITRATE (PF) 100 MCG/2ML IJ SOLN
INTRAMUSCULAR | Status: AC
  Filled 2020-06-03: qty 2

## 2020-06-03 NOTE — Progress Notes (Signed)
PROGRESS NOTE  Morgan Yu OZD:[id number] DOB: 25-Nov-1940 DOA: 05/27/2020 PCP: Patient, [name]  Brief History   8yow PMH metastatic breast cancer followed by oncology, on Herceptin and Arimidex, also proceeding w/ palliative radiation, presented w/ bilateral pleural effusion and acute hypoxic resp failure.  Underwent thoracentesis on the left with nearly 1 L fluid removed.  Cytology unrevealing.  Remained hypoxic, repeat imaging demonstrated bilateral pleural effusions, status post right thoracentesis nearly 1 L fluid removed.  Remained hypoxic.  Repeat chest x-ray 4/29 showed reaccumulation of fluid.  Seen by oncology and palliative medicine.  5/3 with increased oxygen requirement up to 5 L.  Status post left Pleurx catheter placement 5/3.  Followed by oncology, palliative medicine, radiation oncology will see today.  Patient plans to go home, follow-up recommendations from specialists.  If oxygen requirement stable tomorrow, could likely go home.  A & P  Acute hypox resp failure secondary to bilateral pleural effusion --s/p right thoracentesis 4/26 w/ removal 941mL clear fluid.  Cytology unrevealing. S/p left thoracentesis 4/28 removal 900 mL clear fluid.  Cytology not sent. -- Repeat chest x-ray 4/29 demonstrated reaccumulation of fluid. --Although unproven, presumed malignant in nature. -- Remains symptomatic with increased oxygen requirement.  Status post Pleurx 5/3.  If oxygen requirement improves/stabilizes, can likely go home in the next 1 to 2 days.  Metastatic breast cancer -- Followed by oncology, plans to continue current treatment and follow-up as an outpatient.  Chronic bilateral LE edema -- Presumably secondary to malignancy.  2D echocardiogram normal LVEF.  No wall motion abnormalities.  Left ventricular diastolic parameters indeterminate.  Responded well to Lasix.  Anxiety  --driven by dyspnea  Disposition Plan:  Discussion:    Status is:  Inpatient  Remains inpatient appropriate because:Inpatient level of care appropriate due to severity of illness   Dispo: The patient is from: Home              Anticipated d/c is to: Home              Patient currently is not medically stable to d/c.   Difficult to place patient Yes  DVT prophylaxis: SCDs Start: 05/27/20 1919   Code Status: DNR Level of care: Med-Surg Family Communication: POA Lynnell Grain, MD  [facility] Direct contact: see [email or web address] (further directions at bottom of note if needed) 7PM-7AM contact night coverage as at bottom of note 06/03/2020, 3:32 PM  LOS: 7 days   Interval History/Subjective  CC: f/u SOB  Sleeping status post procedure today.  Objective   Vitals:  Vitals:   06/03/20 1110 06/03/20 1412  BP: (!) 216/83 (!) 99/47  Pulse: 91 62  Resp: (!) 23 15  Temp:  97.7 F (36.5 C)  SpO2: (!) 87% 92%    Exam:  Constitutional:   . Appears calm and comfortable Respiratory:  . CTA bilaterally, no w/r/r.  . Respiratory effort normal. Cardiovascular:  . RRR, no m/r/g . No LE extremity edema    I have personally reviewed the following:   Today's Data  . UOP 800 . -3.3L since admission . CBC stable   Scheduled Meds: . anastrozole  1 mg Oral Daily  . fentaNYL      . furosemide  40 mg Oral Daily  . guaiFENesin  600 mg Oral BID  . lidocaine-EPINEPHrine      . midazolam      . multivitamin with minerals  1 tablet Oral Daily   Continuous Infusions: .  ceFAZolin (ANCEF) IV      Principal Problem:   Acute hypoxemic respiratory failure (HCC) Active Problems:   Bone metastases (HCC)   Pleural effusion   Carcinoma of breast metastatic to bone (HCC)   Anxiety state   Bilateral lower extremity edema   LOS: 7 days   How to contact the [facility] Attending or Consulting provider [place] or covering provider during after hours [place], for this patient?  1. Check the care team in [facility] and look for a) attending/consulting TRH  provider listed and b) the [facility] team listed 2. Log into [email or web address] and use [place]'s universal password to access. If you do not have the password, please contact the hospital operator. 3. Locate the [facility] provider you are looking for under [facility] and page to a number that you can be directly reached. 4. If you still have difficulty reaching the provider, please page the [facility] (Director on Call) for the Hospitalists listed on amion for assistance.

## 2020-06-03 NOTE — Plan of Care (Signed)
Patient very sedate after pleurx catheter placement, minimal po intake.  Remains on 5 liters oxygen to maintain oxygen saturation above 90%.

## 2020-06-03 NOTE — Progress Notes (Addendum)
Daily Progress Note   Patient Name: Morgan Yu       Date: 06/03/2020 DOB: [birth year]  Age: 80 y.o. MRN#: [id number] Attending Physician: Samuella Cota, MD Primary Care Physician: Patient, [name] Admit Date: 05/27/2020  Reason for Consultation/Follow-up: Establishing goals of care  Subjective: I saw Morgan Yu today.  She was sleepy following Pleurx placement.  I spoke with her cousin/HC POA, Morgan Yu, at the bedside.  Morgan Yu reports that Morgan Yu has not made a final decision regarding if she wants to continue radiation.  She certainly wants Morgan Yu to make the decision and she will support her with what ever decision she decides.  At the same time, Morgan Yu wants to ensure that we have a clear goal regarding how it is going to improve her quality of life if she continues as she knows will be difficult for Morgan Yu to get back and forth to radiation.  We reviewed hospice benefits and how this would serve doing well if she does decide that she does not want further disease modifying therapy.  Length of Stay: 7  Current Medications: Scheduled Meds:  . anastrozole  1 mg Oral Daily  . fentaNYL      . furosemide  40 mg Oral Daily  . guaiFENesin  600 mg Oral BID  . lidocaine-EPINEPHrine      . midazolam      . multivitamin with minerals  1 tablet Oral Daily    Continuous Infusions: .  ceFAZolin (ANCEF) IV      PRN Meds: guaiFENesin-dextromethorphan, loperamide, LORazepam, ondansetron **OR** ondansetron (ZOFRAN) IV, senna, sodium chloride  Physical Exam         Lying in bed in no distress Sleepy following sedation for Pleurx placement  Vital Signs: BP (!) 108/42 (BP Location: Right Arm)   Pulse 62   Temp 97.7 F (36.5 C) (Oral)   Resp 15   Ht 5\' 1"  (1.549 m)   Wt 48.4 kg    SpO2 92%   BMI 20.16 kg/m  SpO2: SpO2: 92 % O2 Device: O2 Device: Nasal Cannula O2 Flow Rate: O2 Flow Rate (L/min): 5 L/min  Intake/output summary:   Intake/Output Summary (Last 24 hours) at 06/03/2020 1826 Last data filed at 06/03/2020 0359 Gross per 24 hour  Intake 240 ml  Output 300 ml  Net -60 ml   LBM: Last BM Date: 06/02/20 Baseline Weight: Weight: 51.5 kg Most recent weight: Weight: 48.4 kg       Palliative Assessment/Data: Palliative performance scale 50%     Patient Active Problem List   Diagnosis Date Noted  . Acute hypoxemic respiratory failure ([place]) 05/28/2020  . Lymphedema 05/27/2020  . Bilateral lower extremity edema 05/27/2020  . Malignant pleural effusion 05/27/2020  . Anxiety state 05/21/2020  . Carcinoma of breast metastatic to bone ([place]) 03/19/2020  . Protein-calorie malnutrition, severe 03/04/2020  . Pressure injury of skin 02/28/2020  . Bone metastases ([place]) 02/27/2020  . Elevated LFTs 02/27/2020  . Elevated troponin 02/27/2020  . Pleural effusion 02/27/2020  . Breast mass, left 02/27/2020  . Sinus tachycardia 02/27/2020    Palliative Care Assessment & Plan   Patient Profile:  80 year old lady who lives by herself in [place], [place].  Patient has a cousin Morgan Yu who is her healthcare power of attorney agent.  Patient has a life limiting illness of metastatic breast cancer she is followed by oncology she is on Herceptin and Arimidex.  She is also undergoing palliative radiation.  Patient presented with bilateral pleural effusions and acute hypoxic respiratory failure.  Patient's has undergone thoracentesis and extensive fluid removal.  Patient noted to have rapid reaccumulation of fluid on repeat chest x-ray.  Palliative consultation for goals of care discussions has been requested.  Assessment: Shortness of breath Generalized weakness Undergoing consideration for Pleurx drain placement because of rapidly reaccumulating recurrent  pleural effusions likely malignant in etiology Goals of care discussions including hospice discussions are ongoing.  Recommendations/Plan: -  Sleepy status post Pleurx placement.   -  We will plan to follow-up again tomorrow to continue conversation regarding and if she desires to continue XRT.   -  If she is not planning to continue with chemotherapy or radiation, I would recommend hospice services at time of discharge.  Code Status:    Code Status Orders  (From admission, onward)         Start     Ordered   05/27/20 1919  Do not attempt resuscitation (DNR)  Continuous       Question Answer Comment  In the event of cardiac or respiratory ARREST Do not call a "code blue"   In the event of cardiac or respiratory ARREST Do not perform Intubation, CPR, defibrillation or ACLS   In the event of cardiac or respiratory ARREST Use medication by any route, position, wound care, and other measures to relive pain and suffering. May use oxygen, suction and manual treatment of airway obstruction as needed for comfort.      05/27/20 1918        Code Status History    Date Active Date Inactive Code Status Order ID Comments User Context   02/28/2020 0727 03/05/2020 1705 DNR [id number]  Antonieta Pert, MD ED   02/27/2020 2049 02/28/2020 0727 Full Code [id number]  Vianne Bulls, MD ED   Advance Care Planning Activity    Advance Directive Documentation   Flowsheet Row Most Recent Value  Type of Advance Directive Healthcare Power of Attorney, Living will  Pre-existing out of facility DNR order (yellow form or pink MOST form) --  "MOST" Form in Place? --       Prognosis:   guarded, could be less than 6 months.   Discharge Planning:  Home with hospice is being considered, but not finalized yet. Patient already connected with home based palliative  care from [facility].   Care plan was discussed with patient.    Thank you for allowing the Palliative Medicine Team to assist in the care of this  patient.   Total Time 30 Prolonged Time Billed No    Greater than 50%  of this time was spent counseling and coordinating care related to the above assessment and plan. Micheline Rough, MD  Please contact Palliative Medicine Team phone at [phone] for questions and concerns.

## 2020-06-03 NOTE — Progress Notes (Signed)
Nurse bumped patient's oxygen up to 5L/min. to keep patient's oxygen saturation about 92% due to patient's O2 sats ranging from 89-92% when checking patient's vital signs.

## 2020-06-03 NOTE — Plan of Care (Signed)
  Problem: Activity: Goal: Risk for activity intolerance will decrease Outcome: Progressing   Problem: Coping: Goal: Level of anxiety will decrease Outcome: Progressing   Problem: Pain Managment: Goal: General experience of comfort will improve Outcome: Progressing   Problem: Safety: Goal: Ability to remain free from injury will improve Outcome: Progressing   Problem: Skin Integrity: Goal: Risk for impaired skin integrity will decrease Outcome: Progressing   

## 2020-06-03 NOTE — Telephone Encounter (Signed)
Scheduled appt per 5/2 sch msg. Called pt, no answer. Left msg with appt date and time.

## 2020-06-03 NOTE — Procedures (Signed)
Pre procedural Dx: Recurrent pleural effusion Post procedural Dx: Same  Technically successful image guided placement of a tunneled left sided pleural drain. Thoracentesis performed following placement.  A representative aspirated sample was capped and sent to the laboratory for analysis.    EBL: None Complications: None immediate  Ronny Bacon, MD Pager #: [phone]

## 2020-06-04 ENCOUNTER — Ambulatory Visit: Payer: Medicare HMO

## 2020-06-04 DIAGNOSIS — J90 Pleural effusion, not elsewhere classified: Secondary | ICD-10-CM | POA: Diagnosis not present

## 2020-06-04 DIAGNOSIS — Z7189 Other specified counseling: Secondary | ICD-10-CM | POA: Diagnosis not present

## 2020-06-04 DIAGNOSIS — C50912 Malignant neoplasm of unspecified site of left female breast: Secondary | ICD-10-CM | POA: Diagnosis not present

## 2020-06-04 DIAGNOSIS — J9601 Acute respiratory failure with hypoxia: Secondary | ICD-10-CM | POA: Diagnosis not present

## 2020-06-04 LAB — BASIC METABOLIC PANEL
Anion gap: 8 (ref 5–15)
BUN: 21 mg/dL (ref 8–23)
CO2: 33 mmol/L — ABNORMAL HIGH (ref 22–32)
Calcium: 7.8 mg/dL — ABNORMAL LOW (ref 8.9–10.3)
Chloride: 99 mmol/L (ref 98–111)
Creatinine, Ser: 0.44 mg/dL (ref 0.44–1.00)
GFR, Estimated: >60 mL/min (ref >60)
Glucose, Bld: 90 mg/dL (ref 70–99)
Potassium: 3.6 mmol/L (ref 3.5–5.1)
Sodium: 140 mmol/L (ref 135–145)

## 2020-06-04 MED ORDER — HYDROCOD POLST-CPM POLST ER 10-8 MG/5ML PO SUER
5.0000 mL | Freq: Four times a day (QID) | ORAL | Status: DC | PRN
  Administered 2020-06-04: 5 mL via ORAL
  Filled 2020-06-04: qty 5

## 2020-06-04 MED ORDER — BENZONATATE 100 MG PO CAPS
100.0000 mg | ORAL_CAPSULE | Freq: Three times a day (TID) | ORAL | Status: DC
  Administered 2020-06-04 – 2020-06-06 (×7): 100 mg via ORAL
  Filled 2020-06-04 (×8): qty 1

## 2020-06-04 MED ORDER — HYDROCODONE BIT-HOMATROP MBR 5-1.5 MG/5ML PO SOLN: 5.0000 mL | ORAL | Status: DC | PRN

## 2020-06-04 NOTE — Progress Notes (Signed)
Assumed care from earlier RN. Agree with previous assessment. Patient voices no c/o pain or shortness of breath. PRN given earlier helpful. Continue to monitor.Eulas Post, RN

## 2020-06-04 NOTE — Progress Notes (Signed)
Referring Physician(s): TRH  Supervising Physician: Jacqulynn Cadet  Patient Status:  [facility] - In-pt  Chief Complaint: Follow up left chest Pleurx placement 06/03/20 in IR  Subjective:  Patient sleeping upon entry to room, arouses easily to verbal cues, her only complaint is that it is hot in her room. She denies any pain related to the catheter. She does not think the catheter has been used yet.  Allergies: Vicodin hp [hydrocodone-acetaminophen]  Medications: Prior to Admission medications   Medication Sig Start Date End Date Taking? Authorizing Provider  acetaminophen (TYLENOL) 500 MG tablet Take 500 mg by mouth every 6 (six) hours as needed for moderate pain.   Yes [provider]  anastrozole (ARIMIDEX) 1 MG tablet TAKE 1 TABLET BY MOUTH EVERY DAY Patient taking differently: Take 1 mg by mouth daily. 05/05/20  Yes Iruku, Arletha Pili, MD  Chlorphen-Pseudoephed-APAP (TYLENOL ALLERGY SINUS PO) Take 1 tablet by mouth daily as needed (allergy).   Yes [provider]  furosemide (LASIX) 20 MG tablet TAKE 1 TABLET BY MOUTH EVERY DAY Patient taking differently: Take 20 mg by mouth daily. 05/06/20  Yes Iruku, Arletha Pili, MD  LORazepam (ATIVAN) 0.5 MG tablet Take 1 tablet (0.5 mg total) by mouth every 8 (eight) hours as needed for anxiety. Take 1 hour prior to radiation therapy Patient taking differently: Take 0.5 mg by mouth every 8 (eight) hours as needed for anxiety. 05/21/20  Yes Gery Pray, MD  oxymetazoline (AFRIN) 0.05 % nasal spray Place 1 spray into both nostrils daily as needed for congestion.   Yes [provider]  senna (SENOKOT) 8.6 MG TABS tablet Take 8.6 mg by mouth daily as needed for mild constipation.   Yes [provider]  feeding supplement (ENSURE ENLIVE / ENSURE PLUS) LIQD Take 237 mLs by mouth 3 (three) times daily between meals. Patient not taking: No sig reported 03/05/20   Antonieta Pert, MD  mirtazapine (REMERON) 15 MG tablet Take 1 tablet  (15 mg total) by mouth at bedtime. Patient not taking: Reported on 05/27/2020 03/05/20 04/04/20  Antonieta Pert, MD  ondansetron (ZOFRAN) 4 MG tablet Take 1 tablet (4 mg total) by mouth every 8 (eight) hours as needed for nausea or vomiting. Patient not taking: No sig reported 03/24/20   Harle Stanford., PA-C     Vital Signs: BP (!) 124/52 (BP Location: Right Arm)   Pulse 79   Temp 98.1 F (36.7 C) (Oral)   Resp 17   Ht 5\' 1"  (1.549 m)   Wt 106 lb 11.2 oz (48.4 kg)   SpO2 93%   BMI 20.16 kg/m   Physical Exam Vitals and nursing note reviewed.  HENT:     Head: Normocephalic.  Cardiovascular:     Rate and Rhythm: Normal rate.  Pulmonary:     Effort: Pulmonary effort is normal.     Comments: (+) left pleurx with anterior insertion site just left of midline - there is no dressing in place upon my exam, suture remains in tact. Site is non tender to palpation, no bleeding or drainage.  Skin:    General: Skin is warm and dry.  Neurological:     Mental Status: She is alert. Mental status is at baseline.     Imaging: DG Chest 2 View  Result Date: 06/02/2020 CLINICAL DATA:  Pleural effusions.  Breast carcinoma EXAM: CHEST - 2 VIEW COMPARISON:  May 30, 2020 FINDINGS: Pleural effusions, larger on the left than on the right, essentially stable compared to  prior study. Airspace opacity in portions of the inferior lingula and left lower lobe are again noted as is atelectatic change in the right base. Heart size and pulmonary vascularity are within normal limits. No adenopathy appreciable. There is aortic atherosclerosis. Multiple sclerotic bony metastases again noted. IMPRESSION: Persistent pleural effusions, larger on the left than on the right, stable. Lower lung region atelectatic change with suspected superimposed consolidation left lower lobe and potentially in a portion of the inferior lingula. Stable cardiac silhouette. Sclerotic bony metastases consistent with known breast carcinoma. Aortic  Atherosclerosis (ICD10-I70.0). Electronically Signed   By: Lowella Grip III M.D.   On: 06/02/2020 11:04   IR PERC PLEURAL DRAIN W/INDWELL CATH W/IMG GUIDE  Result Date: 06/03/2020 CLINICAL DATA:  History of metastatic breast cancer, now with recurrent symptomatic left-sided pleural effusion. Please perform image guided placement of a tunneled pleural drainage catheter for palliative purposes. EXAM: INSERTION OF TUNNELED LEFT SIDED PLEURAL DRAINAGE CATHETER COMPARISON:  Ultrasound-guided left-sided thoracentesis-05/27/2020; chest CT-05/27/2020; chest radiograph-06/02/2020 MEDICATIONS: Patient is currently admitted to the hospital receiving intravenous antibiotics; Antibiotic was administered in an appropriate time interval for the procedure. ANESTHESIA/SEDATION: Moderate (conscious) sedation was employed during this procedure. A total of Versed 1 mg and Fentanyl 100 mcg was administered intravenously. Moderate Sedation Time: 17 minutes. The patient's level of consciousness and vital signs were monitored continuously by radiology nursing throughout the procedure under my direct supervision. FLUOROSCOPY TIME:  FLUOROSCOPY TIME 24 seconds (17 mGy) COMPLICATIONS: None immediate. PROCEDURE: The procedure, risks, benefits, and alternatives were explained to the patient, who wishes to proceed with the placement of this permanent pleural catheter as they are seeking palliative care. The patient understands and consents to the procedure. The inferolateral aspect of the left chest and upper abdomen were prepped and draped in a sterile fashion, and a sterile drape was applied covering the operative field. A sterile gown and sterile gloves were used for the procedure. Initial ultrasound scanning and fluoroscopic imaging demonstrates a recurrent moderate to large pleural effusion. Under direct ultrasound guidance, the inferior lateral pleural space was accessed with a Yueh sheath needle after the overlying soft tissues  were anesthetized with 1% lidocaine with epinephrine. An Amplatz super stiff wire was then advanced under fluoroscopy into the pleural space. A 15.5 French tunneled Pleur-X catheter was tunneled from an incision within the left upper abdominal quadrant to the access site. The pleural access site was serially dilated under fluoroscopy, ultimately allowing placement of a peel-away sheath. The catheter was advanced through the peel-away sheath. The sheath was then removed. Final catheter positioning was confirmed with a fluoroscopic radiographic image. The access incision was closed with an interrupted subcutaneous subcuticular 4-0 Vicryl, Dermabond and Steri-Strips. A Prolene retention suture was applied at the catheter exit site. Large volume thoracentesis was performed through the new catheter utilizing provided bulb vacuum assisted drainage bag. Dressings were applied. The patient tolerated the above procedure well without immediate postprocedural complication. FINDINGS: Preprocedural ultrasound scanning demonstrates a recurrent large sized anechoic left sided pleural effusion. After ultrasound and fluoroscopic guided placement, the catheter is directed towards the left lung apex. Following catheter placement, approximately 1 L of serous pleural fluid was removed. IMPRESSION: Successful placement of permanent, tunneled left pleural drainage catheter via lateral approach. 1 L of serous pleural fluid was removed following catheter placement. Electronically Signed   By: Sandi Mariscal M.D.   On: 06/03/2020 15:28    Labs:  CBC: Recent Labs    05/27/20 1321 05/27/20 1645 05/28/20 0341  06/03/20 0337  WBC 8.8 9.9 7.9 8.8  HGB 12.0 12.3 10.7* 10.9*  HCT 38.1 39.6 35.0* 35.9*  PLT 326 350 280 299    COAGS: Recent Labs    02/29/20 0515 06/03/20 0337  INR 1.0 1.0    BMP: Recent Labs    05/27/20 1645 05/28/20 0341 05/30/20 0351 06/04/20 0327  NA 137 141 140 140  K 3.9 3.9 3.7 3.6  CL 102 106 103  99  CO2 26 25 28  33*  GLUCOSE 130* 174* 158* 90  BUN 14 19 22 21   CALCIUM 8.5* 7.4* 7.8* 7.8*  CREATININE 0.39* 0.40* 0.53 0.44  GFRNONAA >60 >60 >60 >60    LIVER FUNCTION TESTS: Recent Labs    03/06/20 1528 03/24/20 0824 05/27/20 1645 05/28/20 0341  BILITOT 1.3* 1.2 0.5 0.2*  AST 181* 184* 37 27  ALT 33 28 31 21   ALKPHOS 1,299* 1,262* 360* 286*  PROT 6.3* 6.3* 7.5 5.6*  ALBUMIN 2.2* 2.0* 3.6 2.8*    Assessment and Plan:  80 y/o F with history of metastatic breast cancer and recurrent pleural effusion s/p left chest Pleurx placement yesterday in IR seen today for site check.  Insertion site is clean and dry, there was no dressing in place on my exam - foam dressing + tegaderm applied, discussed with RN about keeping insertion site and tubing covered when not in use.   May begin using catheter PRN, supplies for drainage as an outpatient to be ordered by oncology.  IR remains available as needed - please call with questions or concerns.  Electronically Signed: Joaquim Nam, PA-C 06/04/2020, 10:55 AM   I spent a total of 15 Minutes at the the patient's bedside AND on the patient's hospital floor or unit, greater than 50% of which was counseling/coordinating care for left chest pleurx follow up.

## 2020-06-04 NOTE — Progress Notes (Signed)
[facility] Progress Note  Patient: Morgan Yu    OEU:[id number]  DOA: 05/27/2020     Date of Service: the patient was seen and examined on 06/04/2020  Brief hospital course: Past medical history of metastatic breast cancer on Herceptin and Arimidex, presented with shortness of breath.  Found to have bilateral pleural effusion.  Underwent thoracentesis on the left. Palliative care consulted.  SP Pleurx catheter placement.  Currently plan is continue to engage with family regarding goals of care conversation including comfort care/hospice.  Assessment and Plan: 1.  Acute hypoxic respiratory failure, POA. Bilateral pleural effusion. 80% when she presented at cancer center.  Currently on 2 LPM 92%. Oxygenation progressively worsened. S/P thoracentesis with improvement in oxygenation. Currently on 5 LPM. SP Pleurx catheter placement.  2.  Metastatic breast cancer Followed by oncology. Currently on Herceptin. Patient significantly weak and lethargic.  Not sure whether she can tolerate chemotherapy further.  CT scan on 4/25 shows improvement in liver as well as bony sclerosis. Family and patient wants to consider hospice potentially.  3.  Chronic bilateral lower extremity edema Improving with diuresis. Monitor.  4.  Severe protein calorie malnutrition Cancer-related cachexia. Continue to monitor. Body mass index is 19.1 kg/m.  Nutrition Problem: Severe Malnutrition Etiology: chronic illness,cancer and cancer related treatments Interventions: Interventions: Snacks,MVI,Magic cup   5.  Cough Associated with her cancer. Supportive care.  Diet: Regular diet DVT Prophylaxis:   SCDs Start: 05/27/20 1919    Advance goals of care discussion: DNR  Family Communication: no family was present at bedside, at the time of interview.   Disposition:  Status is: Inpatient  Remains inpatient appropriate because:IV treatments appropriate due to intensity of illness or inability  to take PO   Dispo: The patient is from: Home              Anticipated d/c is to: to be determined              Patient currently is not medically stable to d/c.   Difficult to place patient No  Subjective: Continues to have cough, shortness of breath also has some pain.  No nausea no vomiting.  Physical Exam:  General: Appear in moderate distress, no Rash; Oral Mucosa Clear, moist. no Abnormal Neck Mass Or lumps, Conjunctiva normal  Cardiovascular: S1 and S2 Present, no Murmur, Respiratory: increased respiratory effort, Bilateral Air entry present and bilateral Crackles, no wheezes Abdomen: Bowel Sound present, Soft and no tenderness Extremities: bilateral Pedal edema Neurology: alert and oriented to time, place, and person affect appropriate. no new focal deficit Gait not checked due to patient safety concerns  Vitals:   06/03/20 2057 06/04/20 0526 06/04/20 1431 06/04/20 1546  BP: (!) 122/54 (!) 124/52 (!) 116/50   Pulse: 79 79 80   Resp: 18 17 14    Temp: 97.9 F (36.6 C) 98.1 F (36.7 C) 98.3 F (36.8 C)   TempSrc: Oral Oral Oral   SpO2: 91% 93% 94%   Weight:    45.9 kg  Height:        Intake/Output Summary (Last 24 hours) at 06/04/2020 2035 Last data filed at 06/03/2020 2213 Gross per 24 hour  Intake 120 ml  Output 300 ml  Net -180 ml   Filed Weights   05/27/20 1434 05/27/20 2038 06/04/20 1546  Weight: 51.5 kg 48.4 kg 45.9 kg    Data Reviewed: I have personally reviewed and interpreted daily labs, tele strips, imaging. I reviewed all nursing notes, pharmacy notes,  vitals, pertinent old records I have discussed plan of care as described above with RN and patient/family.  CBC: Recent Labs  Lab 06/03/20 0337  WBC 8.8  NEUTROABS 6.1  HGB 10.9*  HCT 35.9*  MCV 94.0  PLT 953   Basic Metabolic Panel: Recent Labs  Lab 05/30/20 0351 06/04/20 0327  NA 140 140  K 3.7 3.6  CL 103 99  CO2 28 33*  GLUCOSE 158* 90  BUN 22 21  CREATININE 0.53 0.44  CALCIUM  7.8* 7.8*    Studies: No results found.  Scheduled Meds: . anastrozole  1 mg Oral Daily  . benzonatate  100 mg Oral TID  . furosemide  40 mg Oral Daily  . guaiFENesin  600 mg Oral BID  . multivitamin with minerals  1 tablet Oral Daily   Continuous Infusions: PRN Meds: chlorpheniramine-HYDROcodone, guaiFENesin-dextromethorphan, loperamide, LORazepam, ondansetron **OR** ondansetron (ZOFRAN) IV, senna, sodium chloride  Time spent: 35 minutes  Author: Berle Mull, MD [facility] 06/04/2020 8:35 PM  To reach On-call, see care teams to locate the attending and reach out via [email or web address]. Between 7PM-7AM, please contact night-coverage If you still have difficulty reaching the attending provider, please page the [facility] (Director on Call) for [facility] on amion for assistance.

## 2020-06-04 NOTE — Progress Notes (Signed)
PT Cancellation Note  Patient Details Name: ADDISYNN VASSELL MRN: [id number] DOB: [birth year]   Cancelled Treatment:     pt declined any OOB activity "just not up for it".  Then tech came in to take pt downstairs for Radiation.  Apparently pt declined that as well. Will attempt to see pt another day as schedule permits   Rica Koyanagi  PTA Acute  Rehabilitation Services Pager      [phone] Office      [phone]

## 2020-06-04 NOTE — Progress Notes (Signed)
Nutrition Follow-up  DOCUMENTATION CODES:   Severe malnutrition in context of chronic illness  INTERVENTION:  - continue snacks TID between meals. - continue Magic Cup BID. - re-weigh patient today.    NUTRITION DIAGNOSIS:   Severe Malnutrition related to chronic illness,cancer and cancer related treatments as evidenced by severe fat depletion,severe muscle depletion,edema. -ongoing  GOAL:   Patient will meet greater than or equal to 90% of their needs -met  MONITOR:   PO intake,Supplement acceptance,Labs,Weight trends,Skin,I & O's  ASSESSMENT:   80 yo female with PMH of carcinoma of the breast with metastasis to the bones who is being followed by oncology on Herceptin and Arimidex subcutaneously who also is on Zometa for bone metastasis who presents with BLE edema and BL pleural effusion.  Patient underwent thoracentesis on 4/26 with 950 ml removed and repeat on 4/28 with 900 ml removed. A Pleurex catheter was placed yesterday and notes indicate that patient was very drowsy after procedure.   Over the past 1 week she has mainly been eating 75-100% at meals, but on 5/2 ate 10% at lunch and yesterday ate 10% at dinner (suspect d/t drowsiness after procedure).   She has not been weighed since 4/26. Non-pitting edema to RUE and mild pitting edema to LUE and BLE documented in the edema section of flow sheet.   Patient was out of the room to radiation earlier today. She was asleep at time of attempted visit after XRT and no family was present. Lunch tray was untouched and on bedside table.   Palliative Care is following and discussions are ongoing. She is DNR.   Labs reviewed; Ca: 7.8 mg/dl. Medications reviewed; 40 mg oral lasix/day, 1 tablet multivitamin with minerals/day.   Diet Order:   Diet Order            Diet regular Room service appropriate? Yes; Fluid consistency: Thin  Diet effective now                 EDUCATION NEEDS:   Education needs have been  addressed  Skin:  Skin Assessment: Skin Integrity Issues: Skin Integrity Issues:: Incisions Incisions: L mid breast, closed  Last BM:  5/2  Height:   Ht Readings from Last 1 Encounters:  05/27/20 5' 1"  (1.549 m)    Weight:   Wt Readings from Last 1 Encounters:  05/27/20 48.4 kg    Estimated Nutritional Needs:  Kcal:  1550-1750 kcal Protein:  70-85 grams Fluid:  >/= 1.2 L/day      Jarome Matin, MS, RD, LDN, CNSC Inpatient Clinical Dietitian RD pager # available in [place]  After hours/weekend pager # available in [facility]

## 2020-06-04 NOTE — Progress Notes (Signed)
Daily Progress Note   Patient Name: Morgan Yu       Date: 06/04/2020 DOB: 08/24/1940  Age: 80 y.o. MRN#: [id number] Attending Physician: Lavina Hamman, MD Primary Care Physician: Patient, [name] Admit Date: 05/27/2020  Reason for Consultation/Follow-up: Establishing goals of care  Subjective: I met today with Ms. Wessling and her cousin/HC POA, Baker Janus.  We had a long discussion regarding goals of care.  Initially, we discussed plan for continuation of radiation and systemic therapy, but at the end of that discussion, Ms. Maskell stated that she was "tired" and felt that it would be too much to try to keep coming back and forth to the hospital for treatments.  We then discussed foregoing further radiation but continue with systemic therapy.  She again expressed wanting to be at home and that she getting to appointments has become difficult and she is not sure she wants to continue coming back and forth to the hospital.  She expressed that being at home and being as comfortable as possible her main goals moving forward.  We discussed that may be reasonable to consider foregoing further radiation and systemic therapy and electing her hospice benefits if her goal is to maintain comfort in her own home.  She expressed that she thought this may be best and her cousin also indicated she thought at home with hospice would be the best way to meet her current care goals.  She did asked me to reach out to Dr. Chryl Heck and Dr. Sondra Come to see if they had concerns about plan to forego further treatment and transition home with support of hospice.  Length of Stay: 8  Current Medications: Scheduled Meds:  . anastrozole  1 mg Oral Daily  . benzonatate  100 mg Oral TID  . furosemide  40 mg Oral  Daily  . guaiFENesin  600 mg Oral BID  . multivitamin with minerals  1 tablet Oral Daily    Continuous Infusions:   PRN Meds: chlorpheniramine-HYDROcodone, guaiFENesin-dextromethorphan, loperamide, LORazepam, ondansetron **OR** ondansetron (ZOFRAN) IV, senna, sodium chloride  Physical Exam         General: Alert, awake, in no acute distress.  HEENT: No bruits, no goiter, no JVD Heart: Regular rate and rhythm. No murmur appreciated. Lungs: Good air movement, clear Abdomen: Soft, nontender, nondistended, positive  bowel sounds.  Ext: No significant edema Skin: Warm and dry  Vital Signs: BP (!) 118/54 (BP Location: Right Arm)   Pulse 78   Temp 98.2 F (36.8 C) (Oral)   Resp 17   Ht 5' 1"  (1.549 m)   Wt 45.9 kg   SpO2 98%   BMI 19.10 kg/m  SpO2: SpO2: 98 % O2 Device: O2 Device: Nasal Cannula O2 Flow Rate: O2 Flow Rate (L/min): 5 L/min  Intake/output summary:  No intake or output data in the 24 hours ending 06/04/20 2257 LBM: Last BM Date: 06/02/20 Baseline Weight: Weight: 51.5 kg Most recent weight: Weight: 45.9 kg       Palliative Assessment/Data: Palliative performance scale 50%     Patient Active Problem List   Diagnosis Date Noted  . Acute hypoxemic respiratory failure ([place]) 05/28/2020  . Lymphedema 05/27/2020  . Bilateral lower extremity edema 05/27/2020  . Malignant pleural effusion 05/27/2020  . Anxiety state 05/21/2020  . Carcinoma of breast metastatic to bone ([place]) 03/19/2020  . Protein-calorie malnutrition, severe 03/04/2020  . Pressure injury of skin 02/28/2020  . Bone metastases ([place]) 02/27/2020  . Elevated LFTs 02/27/2020  . Elevated troponin 02/27/2020  . Pleural effusion 02/27/2020  . Breast mass, left 02/27/2020  . Sinus tachycardia 02/27/2020    Palliative Care Assessment & Plan   Patient Profile:  80 year old lady who lives by herself in [place], [place].  Patient has a cousin Doristine Section who is her healthcare power of attorney  agent.  Patient has a life limiting illness of metastatic breast cancer she is followed by oncology she is on Herceptin and Arimidex.  She is also undergoing palliative radiation.  Patient presented with bilateral pleural effusions and acute hypoxic respiratory failure.  Patient's has undergone thoracentesis and extensive fluid removal.  Patient noted to have rapid reaccumulation of fluid on repeat chest x-ray.  Palliative consultation for goals of care discussions has been requested.  Recommendations/Plan: - Long discussion with patient and her cousin/HCPOA.  She tells me today that she think she would be best served by working to transition home with hospice support.  She did ask for me to reach out to Dr. Chryl Heck and Dr. Sondra Come for their input to see if they have concerns about a plan for her to forego further therapy and transition home with hospice support. -She is currently on service with [facility]'s outpatient palliative care arm.  She and her cousin agree that she would like to work with [facility] for home hospice services at time of discharge.  Code Status:    Code Status Orders  (From admission, onward)         Start     Ordered   05/27/20 1919  Do not attempt resuscitation (DNR)  Continuous       Question Answer Comment  In the event of cardiac or respiratory ARREST Do not call a "code blue"   In the event of cardiac or respiratory ARREST Do not perform Intubation, CPR, defibrillation or ACLS   In the event of cardiac or respiratory ARREST Use medication by any route, position, wound care, and other measures to relive pain and suffering. May use oxygen, suction and manual treatment of airway obstruction as needed for comfort.      05/27/20 1918        Code Status History    Date Active Date Inactive Code Status Order ID Comments User Context   02/28/2020 0727 03/05/2020 1705 DNR [id number]  Antonieta Pert,  MD ED   02/27/2020 2049 02/28/2020 0727 Full Code [id number]  Vianne Bulls, MD ED   Advance Care Planning Activity    Advance Directive Documentation   Flowsheet Row Most Recent Value  Type of Advance Directive Healthcare Power of Attorney, Living will  Pre-existing out of facility DNR order (yellow form or pink MOST form) --  "MOST" Form in Place? --       Prognosis:  < 6 months  Discharge Planning:  Home with hospice is being considered, but not finalized yet. Patient already connected with home based palliative care from [facility].   Care plan was discussed with patient.    Thank you for allowing the Palliative Medicine Team to assist in the care of this patient.   Total Time 50 Prolonged Time Billed No    Greater than 50%  of this time was spent counseling and coordinating care related to the above assessment and plan. Micheline Rough, MD  Please contact Palliative Medicine Team phone at [phone] for questions and concerns.

## 2020-06-04 NOTE — Plan of Care (Signed)
  Problem: Health Behavior/Discharge Planning: Goal: Ability to manage health-related needs will improve Outcome: Progressing   Problem: Clinical Measurements: Goal: Diagnostic test results will improve Outcome: Progressing Goal: Respiratory complications will improve Outcome: Progressing   Problem: Coping: Goal: Level of anxiety will decrease Outcome: Progressing   Problem: Pain Managment: Goal: General experience of comfort will improve Outcome: Adequate for Discharge

## 2020-06-05 ENCOUNTER — Telehealth: Payer: Self-pay

## 2020-06-05 ENCOUNTER — Ambulatory Visit: Payer: Medicare HMO

## 2020-06-05 DIAGNOSIS — J9601 Acute respiratory failure with hypoxia: Secondary | ICD-10-CM | POA: Diagnosis not present

## 2020-06-05 DIAGNOSIS — Z7189 Other specified counseling: Secondary | ICD-10-CM | POA: Diagnosis not present

## 2020-06-05 DIAGNOSIS — C50912 Malignant neoplasm of unspecified site of left female breast: Secondary | ICD-10-CM | POA: Diagnosis not present

## 2020-06-05 DIAGNOSIS — J90 Pleural effusion, not elsewhere classified: Secondary | ICD-10-CM | POA: Diagnosis not present

## 2020-06-05 DIAGNOSIS — Z515 Encounter for palliative care: Secondary | ICD-10-CM

## 2020-06-05 LAB — CYTOLOGY - NON PAP

## 2020-06-05 MED ORDER — HYDROCOD POLST-CPM POLST ER 10-8 MG/5ML PO SUER: 5.0000 mL | Freq: Four times a day (QID) | ORAL | 0 refills | Status: DC | PRN

## 2020-06-05 MED ORDER — GUAIFENESIN ER 600 MG PO TB12: 600.0000 mg | ORAL_TABLET | Freq: Two times a day (BID) | ORAL | 0 refills | Status: DC

## 2020-06-05 MED ORDER — OXYCODONE HCL 5 MG PO TABS: 2.5000 mg | ORAL_TABLET | ORAL | 0 refills | Status: DC | PRN

## 2020-06-05 MED ORDER — BENZONATATE 100 MG PO CAPS: 100.0000 mg | ORAL_CAPSULE | Freq: Three times a day (TID) | ORAL | 0 refills | Status: DC

## 2020-06-05 NOTE — Progress Notes (Signed)
Daily Progress Note   Patient Name: Morgan Yu       Date: 06/05/2020 DOB: 1941-01-08  Age: 80 y.o. MRN#: [id number] Attending Physician: Lavina Hamman, MD Primary Care Physician: Patient, [name] Admit Date: 05/27/2020  Reason for Consultation/Follow-up: Establishing goals of care  Subjective: I met today with Morgan Yu.  We discussed again her wishes for care moving forward and she expressed that being home and being as comfortable as possible are the most important things moving forward.  She expressed appreciation for all the care that she has received but reports that she is "tired" and cannot imaging continuing to go back and forth for appointments and treatments.    She understands that her care team wants to support her with whatever decision she makes, and she expressed again desire to be at home with hospice providing care for her there rather than continuing to come to the hospital or other appointments.  I called and discussed with her HCPOA, Morgan Yu, as well.  Morgan Yu reports that she and Morgan Yu have been discussing options and that Morgan Yu has told her as well that being home is most important to her moving forward.  She has tried very hard to ensure that Morgan Yu is making decision independently and is fully invested in whatever Morgan Yu has decided.  We discussed Morgan Yu telling me that she desires to transition home with hospice, and Morgan Yu said that this does not surprise her and she support this decision.    Length of Stay: 9  Current Medications: Scheduled Meds:  . anastrozole  1 mg Oral Daily  . benzonatate  100 mg Oral TID  . furosemide  40 mg Oral Daily  . guaiFENesin  600 mg Oral BID  . multivitamin with minerals  1 tablet Oral Daily    Continuous Infusions:   PRN  Meds: chlorpheniramine-HYDROcodone, guaiFENesin-dextromethorphan, loperamide, LORazepam, ondansetron **OR** ondansetron (ZOFRAN) IV, senna, sodium chloride  Physical Exam         General: Alert, awake, in no acute distress.  HEENT: No bruits, no goiter, no JVD Heart: Regular rate and rhythm. No murmur appreciated. Lungs: Good air movement, clear Abdomen: Soft, nontender, nondistended, positive bowel sounds.  Ext: No significant edema Skin: Warm and dry  Vital Signs: BP (!) 128/54 (BP Location: Right Arm)   Pulse  72   Temp 98 F (36.7 C) (Oral)   Resp 19   Ht 5' 1"  (1.549 m)   Wt 45.9 kg   SpO2 94%   BMI 19.10 kg/m  SpO2: SpO2: 94 % O2 Device: O2 Device: Nasal Cannula O2 Flow Rate: O2 Flow Rate (L/min): 5 L/min  Intake/output summary:   Intake/Output Summary (Last 24 hours) at 06/05/2020 1423 Last data filed at 06/05/2020 1443 Gross per 24 hour  Intake 360 ml  Output 450 ml  Net -90 ml   LBM: Last BM Date: 06/05/20 Baseline Weight: Weight: 51.5 kg Most recent weight: Weight: 45.9 kg       Palliative Assessment/Data: Palliative performance scale 50%   Flowsheet Rows   Flowsheet Row Most Recent Value  Intake Tab   Referral Department Hospitalist  Unit at Time of Referral Other (Comment)  Palliative Care Primary Diagnosis Cancer  Date Notified 05/30/20  Palliative Care Type Return patient Palliative Care  Reason for referral Clarify Goals of Care  Date of Admission 05/27/20  Date first seen by Palliative Care 05/31/20  # of days Palliative referral response time 1 Day(s)  # of days IP prior to Palliative referral 3  Clinical Assessment   Psychosocial & Spiritual Assessment   Palliative Care Outcomes       Patient Active Problem List   Diagnosis Date Noted  . Acute hypoxemic respiratory failure ([place]) 05/28/2020  . Lymphedema 05/27/2020  . Bilateral lower extremity edema 05/27/2020  . Malignant pleural effusion 05/27/2020  . Anxiety state 05/21/2020  .  Carcinoma of breast metastatic to bone ([place]) 03/19/2020  . Protein-calorie malnutrition, severe 03/04/2020  . Pressure injury of skin 02/28/2020  . Bone metastases ([place]) 02/27/2020  . Elevated LFTs 02/27/2020  . Elevated troponin 02/27/2020  . Pleural effusion 02/27/2020  . Breast mass, left 02/27/2020  . Sinus tachycardia 02/27/2020    Palliative Care Assessment & Plan   Patient Profile:  80 year old lady who lives by herself in [place], [place].  Patient has a cousin Morgan Yu who is her healthcare power of attorney agent.  Patient has a life limiting illness of metastatic breast cancer she is followed by oncology she is on Herceptin and Arimidex.  She is also undergoing palliative radiation.  Patient presented with bilateral pleural effusions and acute hypoxic respiratory failure.  Patient's has undergone thoracentesis and extensive fluid removal.  Patient noted to have rapid reaccumulation of fluid on repeat chest x-ray.  Palliative consultation for goals of care discussions has been requested.  Recommendations/Plan: - Ms. Crail again confirmed today that she desires to transition home with home hospice services.  Referral placed to Morgan Yu and appreciate assistance.  I updated the rest of her care team via secure chat. -She is currently on service with [facility] outpatient palliative care arm.  She and her cousin agree that she would like to work with Morgan Yu for home hospice services at time of discharge.  Code Status:    Code Status Orders  (From admission, onward)         Start     Ordered   05/27/20 1919  Do not attempt resuscitation (DNR)  Continuous       Question Answer Comment  In the event of cardiac or respiratory ARREST Do not call a "code blue"   In the event of cardiac or respiratory ARREST Do not perform Intubation, CPR, defibrillation or ACLS   In the event of cardiac or respiratory ARREST Use medication by any route,  position, wound care,  and other measures to relive pain and suffering. May use oxygen, suction and manual treatment of airway obstruction as needed for comfort.      05/27/20 1918        Code Status History    Date Active Date Inactive Code Status Order ID Comments User Context   02/28/2020 0727 03/05/2020 1705 DNR [id number]  Antonieta Pert, MD ED   02/27/2020 2049 02/28/2020 0727 Full Code [id number]  Vianne Bulls, MD ED   Advance Care Planning Activity    Advance Directive Documentation   Flowsheet Row Most Recent Value  Type of Advance Directive Healthcare Power of Attorney, Living will  Pre-existing out of facility DNR order (yellow form or pink MOST form) --  "MOST" Form in Place? --       Prognosis:  < 6 months  Discharge Planning:  Home with hospice through [facility].   Care plan was discussed with patient.    Thank you for allowing the Palliative Medicine Team to assist in the care of this patient.   Total Time 40 Prolonged Time Billed No    Greater than 50%  of this time was spent counseling and coordinating care related to the above assessment and plan.  Micheline Rough, MD  Please contact Palliative Medicine Team phone at [phone] for questions and concerns.

## 2020-06-05 NOTE — Progress Notes (Signed)
Manufacturing engineer [facility])  Received request from [facility] for hospice services at home after discharge.  Chart and pt information under review by [facility] physician.  Hospice eligibility pending at this time.  Hospital liaison spoke with Minus Liberty to initiate education related to hospice philosophy and services and to answer any questions at this time.  Edd Fabian verbalized understanding of information given.  Per discussion the plan is to discharge home tomorrow by private vehicle.    [place] send signed and completed DNR home with pt/family.  Please provide prescriptions at discharge as needed to ensure ongoing symptom management until pt can be admitted onto hospice services.    DME needs discussed.  Pt has a bedside commode and a rolling walker. Oxygen concentrator to be delivered to the home today with a transport tank to be delivered to the room for pt to use tranporting home.   Address has been verified and is correct in the chart.  ACC information and contact numbers given to [facility].  Above information shared with Cookie, [place].  Please call with any questions or concerns.  Thank you for the opportunity to participate in this pt's care.  Domenic Moras, BSN, RN [employer] [phone] (24h on call)

## 2020-06-05 NOTE — Progress Notes (Signed)
Occupational Therapy Treatment Patient Details Name: Morgan Yu MRN: [id number] DOB: 10/11/1940 Today's Date: 06/05/2020    History of present illness 52yow PMH metastatic breast cancer followed by oncology, on Herceptin and Arimidex, also proceeding w/ palliative radiation, presented w/ bilateral pleural effusion and acute hypoxic resp failure.  Underwent thoracentesis on the left and right .  Repeat chest x-ray 4/29 showed reaccumulation of fluid.  Seen by oncology and palliative medicine.   OT comments  Provided patient with energy conservation handout and provided education re: use of DME, adaptive equipment and taking seated rest breaks during ADL tasks as needed. Patient reports has all home DME needs. Patient overall supervision level for grooming/hygiene at sink side, toilet transfer and lower body dressing in bathroom. Patient reports she is hopeful to D/C today, plan has been updated to D/C home with hospice.   Follow Up Recommendations  Supervision - Intermittent;Other (comment) (patient now planning to D/C home on hospice)    Equipment Recommendations  None recommended by OT       Precautions / Restrictions Precautions Precautions: Fall Precaution Comments: monitor sats, on O2 Restrictions Weight Bearing Restrictions: No       Mobility Bed Mobility Overal bed mobility: Modified Independent                  Transfers Overall transfer level: Needs assistance Equipment used: Rolling walker (2 wheeled) Transfers: Sit to/from Stand Sit to Stand: Supervision              Balance Overall balance assessment: Mild deficits observed, not formally tested                                         ADL either performed or assessed with clinical judgement   ADL Overall ADL's : Needs assistance/impaired     Grooming: Oral care;Wash/dry face;Wash/dry hands;Standing;Supervision/safety       Lower Body Bathing: Set up;Sitting/lateral leans;Sit  to/from stand;Supervison/ safety Lower Body Bathing Details (indicate cue type and reason): to wash peri area     Lower Body Dressing: Supervision/safety;Set up;Sitting/lateral leans;Sit to/from stand Lower Body Dressing Details (indicate cue type and reason): to doff mesh underwear and don clean pair Toilet Transfer: Supervision/safety;Ambulation;RW;Regular Glass blower/designer Details (indicate cue type and reason): no physical assistance to power up to standing Toileting- Clothing Manipulation and Hygiene: Supervision/safety;Sit to/from stand       Functional mobility during ADLs: Supervision/safety;Rolling walker General ADL Comments: educated patient on energy conservation strategies as well as provided hand out, instructed in helpful strategies for home               Cognition Arousal/Alertness: Awake/alert Behavior During Therapy: Flat affect Overall Cognitive Status: Within Functional Limits for tasks assessed                                                General Comments pt on 5L O2    Pertinent Vitals/ Pain       Pain Assessment: No/denies pain         Frequency  Min 2X/week        Progress Toward Goals  OT Goals(current goals can now be found in the care plan section)  Progress towards OT goals: Progressing toward goals  Acute  Rehab OT Goals Patient Stated Goal: go home, take care of myself OT Goal Formulation: With patient Time For Goal Achievement: 06/15/20 Potential to Achieve Goals: Good ADL Goals Pt Will Perform Lower Body Bathing: with modified independence;sit to/from stand Pt Will Perform Lower Body Dressing: with modified independence;sit to/from stand Pt Will Transfer to Toilet: with modified independence;ambulating Pt Will Perform Toileting - Clothing Manipulation and hygiene: with modified independence;sit to/from stand Pt Will Perform Tub/Shower Transfer: with supervision;ambulating;shower seat;rolling  walker Additional ADL Goal #1: Pt will independently verbalize 3 energy conservation strategies to utilize for managing ADLs/IADLs.  Plan Discharge plan needs to be updated       AM-PAC OT "6 Clicks" Daily Activity     Outcome Measure   Help from another person eating meals?: None Help from another person taking care of personal grooming?: A Little Help from another person toileting, which includes using toliet, bedpan, or urinal?: A Little Help from another person bathing (including washing, rinsing, drying)?: A Little Help from another person to put on and taking off regular upper body clothing?: A Little Help from another person to put on and taking off regular lower body clothing?: A Little 6 Click Score: 19    End of Session Equipment Utilized During Treatment: Rolling walker;Oxygen  OT Visit Diagnosis: Unsteadiness on feet (R26.81);Muscle weakness (generalized) (M62.81)   Activity Tolerance Patient tolerated treatment well   Patient Left in chair;with call bell/phone within reach   Nurse Communication Other (comment) (IV site pulling out)        Time: 8676-1950 OT Time Calculation (min): 29 min  Charges: OT General Charges $OT Visit: 1 Visit OT Treatments $Self Care/Home Management : 23-37 mins  Delbert Phenix OT OT pager: [place] 06/05/2020, 9:50 AM

## 2020-06-05 NOTE — TOC Progression Note (Signed)
Transition of Care [facility]) - Progression Note    Patient Details  Name: ZORAIDA HAVRILLA MRN: [id number] Date of Birth: [birth year]  Transition of [facility]) CM/SW Contact  Purcell Mouton, RN Phone Number: 06/05/2020, 12:03 PM  Clinical Narrative:    [facility] was selected for Home Hospice. Referral given to in house rep Mower, RN.    Expected Discharge Plan: [place] Barriers to Discharge: Continued Medical Work up  Expected Discharge Plan and Services Expected Discharge Plan: [place] In-house Referral: Clinical Social Work   Post Acute Care Choice: [place] arrangements for the past 2 months: [place]: PT,OT [place]: Well [place] Date [place]: 06/01/20 Time [place]: [id number] Representative spoke with at [place]: [place] ([place]) Interventions    Readmission Risk Interventions No flowsheet data found.

## 2020-06-05 NOTE — Plan of Care (Signed)
  Problem: Health Behavior/Discharge Planning: Goal: Ability to manage health-related needs will improve Outcome: Progressing   Problem: Clinical Measurements: Goal: Respiratory complications will improve Outcome: Progressing   Problem: Activity: Goal: Risk for activity intolerance will decrease Outcome: Progressing   Problem: Safety: Goal: Ability to remain free from injury will improve Outcome: Progressing   

## 2020-06-05 NOTE — Telephone Encounter (Signed)
Patient is currently inpatient, called to verify radiation  treatment time with Patient and nurse. Per patient she will no longer wishes to receive radiation treatments. Per nurse plan is for patient to discharge home with hospice services.

## 2020-06-05 NOTE — Progress Notes (Signed)
[facility] Progress Note  Patient: Morgan Yu    PYP:[id number]  DOA: 05/27/2020     Date of Service: the patient was seen and examined on 06/05/2020  Brief hospital course: Past medical history of metastatic breast cancer on Herceptin and Arimidex, presented with shortness of breath.  Found to have bilateral pleural effusion.  Underwent thoracentesis on the left. Palliative care consulted.  S/P Pleurx catheter placement.  Currently plan is continue to engage with family regarding goals of care conversation including comfort care/hospice.  Assessment and Plan: 1.  Acute hypoxic respiratory failure, POA. Bilateral pleural effusion. 80% when she presented at cancer center.  Currently on 2 LPM 92%. Oxygenation progressively worsened. S/P thoracentesis with improvement in oxygenation. Currently on 5 LPM. SP Pleurx catheter placement.  2.  Metastatic breast cancer Followed by oncology. Currently on Herceptin. Patient significantly weak and lethargic.  Not sure whether she can tolerate chemotherapy further.  CT scan on 4/25 shows improvement in liver as well as bony sclerosis. Family and patient wants to consider hospice potentially.  3.  Chronic bilateral lower extremity edema Improving with diuresis. Monitor.  4.  Severe protein calorie malnutrition Cancer-related cachexia. Continue to monitor. Body mass index is 19.1 kg/m.  Nutrition Problem: Severe Malnutrition Etiology: chronic illness,cancer and cancer related treatments Interventions: Interventions: Snacks,MVI,Magic cup   5.  Cough Associated with her cancer. Supportive care.  6.  Goals of care conversation. Highly appreciate palliative care service specifically Dr. Kirstie Mirza assistance in this patient's management. Patient prefers to go home with hospice. Patient does not want to be back and forth between hospital or clinic for therapy or hospitalization. Patient wants to focus on comfort as her  goal. Hospice has been consulted. Will await for hospice services arrangement at home.  Diet: Regular diet DVT Prophylaxis:   SCDs Start: 05/27/20 1919    Advance goals of care discussion: DNR  Family Communication: no family was present at bedside, at the time of interview.   Disposition:  Status is: Inpatient  Remains inpatient appropriate because:IV treatments appropriate due to intensity of illness or inability to take PO   Dispo: The patient is from: Home              Anticipated d/c is to: to be determined              Patient currently is not medically stable to d/c.   Difficult to place patient No  Subjective: No nausea no vomiting.  No fever no chills.  Continues to have cough continues to have shortness of breath.  Physical Exam:  General: Appear in mild distress, no Rash; Oral Mucosa Clear, moist. no Abnormal Neck Mass Or lumps, Conjunctiva normal  Cardiovascular: S1 and S2 Present, no Murmur, Respiratory: good respiratory effort, Bilateral Air entry present and CTA, no Crackles, no wheezes Abdomen: Bowel Sound present, Soft and no tenderness Extremities: no Pedal edema Neurology: alert and oriented to time, place, and person affect appropriate. no new focal deficit Gait not checked due to patient safety concerns  Vitals:   06/04/20 1546 06/04/20 2050 06/05/20 0640 06/05/20 1239  BP:  (!) 118/54 (!) 148/50 (!) 128/54  Pulse:  78 76 72  Resp:  17 19 19   Temp:  98.2 F (36.8 C) 97.9 F (36.6 C) 98 F (36.7 C)  TempSrc:  Oral Oral Oral  SpO2:  98% 93% 94%  Weight: 45.9 kg     Height:        Intake/Output Summary (Last  24 hours) at 06/05/2020 1938 Last data filed at 06/05/2020 0933 Gross per 24 hour  Intake 600 ml  Output 450 ml  Net 150 ml   Filed Weights   05/27/20 1434 05/27/20 2038 06/04/20 1546  Weight: 51.5 kg 48.4 kg 45.9 kg    Data Reviewed: I have personally reviewed and interpreted daily labs, tele strips, imaging. I reviewed all nursing  notes, pharmacy notes, vitals, pertinent old records I have discussed plan of care as described above with RN and patient/family.  CBC: Recent Labs  Lab 06/03/20 0337  WBC 8.8  NEUTROABS 6.1  HGB 10.9*  HCT 35.9*  MCV 94.0  PLT 122   Basic Metabolic Panel: Recent Labs  Lab 05/30/20 0351 06/04/20 0327  NA 140 140  K 3.7 3.6  CL 103 99  CO2 28 33*  GLUCOSE 158* 90  BUN 22 21  CREATININE 0.53 0.44  CALCIUM 7.8* 7.8*    Studies: No results found.  Scheduled Meds: . anastrozole  1 mg Oral Daily  . benzonatate  100 mg Oral TID  . furosemide  40 mg Oral Daily  . guaiFENesin  600 mg Oral BID  . multivitamin with minerals  1 tablet Oral Daily   Continuous Infusions: PRN Meds: chlorpheniramine-HYDROcodone, guaiFENesin-dextromethorphan, loperamide, LORazepam, ondansetron **OR** ondansetron (ZOFRAN) IV, senna, sodium chloride  Time spent: 35 minutes  Author: Berle Mull, MD [facility] 06/05/2020 7:38 PM  To reach On-call, see care teams to locate the attending and reach out via [email or web address]. Between 7PM-7AM, please contact night-coverage If you still have difficulty reaching the attending provider, please page the [facility] (Director on Call) for [facility] on amion for assistance.

## 2020-06-06 ENCOUNTER — Ambulatory Visit: Payer: Medicare HMO

## 2020-06-06 ENCOUNTER — Inpatient Hospital Stay (HOSPITAL_COMMUNITY): Payer: Medicare HMO

## 2020-06-06 DIAGNOSIS — Z515 Encounter for palliative care: Secondary | ICD-10-CM | POA: Diagnosis not present

## 2020-06-06 DIAGNOSIS — Z7189 Other specified counseling: Secondary | ICD-10-CM | POA: Diagnosis not present

## 2020-06-06 DIAGNOSIS — C50912 Malignant neoplasm of unspecified site of left female breast: Secondary | ICD-10-CM | POA: Diagnosis not present

## 2020-06-06 DIAGNOSIS — C7951 Secondary malignant neoplasm of bone: Secondary | ICD-10-CM | POA: Diagnosis not present

## 2020-06-06 DIAGNOSIS — J9601 Acute respiratory failure with hypoxia: Secondary | ICD-10-CM | POA: Diagnosis not present

## 2020-06-06 DIAGNOSIS — J90 Pleural effusion, not elsewhere classified: Secondary | ICD-10-CM | POA: Diagnosis not present

## 2020-06-06 LAB — LACTATE DEHYDROGENASE: LDH: 162 U/L (ref 98–192)

## 2020-06-06 LAB — ALBUMIN: Albumin: 2.3 g/dL — ABNORMAL LOW (ref 3.5–5.0)

## 2020-06-06 LAB — PROTEIN, TOTAL: Total Protein: 5.6 g/dL — ABNORMAL LOW (ref 6.5–8.1)

## 2020-06-06 MED ORDER — FUROSEMIDE 40 MG PO TABS: 40.0000 mg | ORAL_TABLET | Freq: Every day | ORAL | 0 refills | Status: DC

## 2020-06-06 NOTE — Discharge Instructions (Signed)
Indwelling Pleural Catheter Home Guide -Drain 576ml as needed for shortness of breath.  -If after 544ml is drained, pt still remains with symptoms drain another 532ml.  -Do not drain more than 1039ml in a day.  -Call provider if pt remains symptomatic despite draining 1031ml or if you not able to drain anything out of the catheter.     An indwelling pleural catheter is a thin, flexible tube that is inserted under your skin and into your chest. The catheter drains excess fluid that collects in the area between the chest wall and the lungs (pleural space).After the catheter is inserted, it can be attached to a bottle that collects fluid. The pleural catheter will allow you to drain fluid from your chest at home on a regular basis (sometimes daily). This will eliminate the need for frequent visits to the hospital or clinic to drain the fluid. The catheter may be removed after the excess fluid problem is resolved, usually after 2-3 months. It is important to follow instructions from your health care provider about how to drain and care for your catheter. What are the risks? Generally, this is a safe procedure. However, problems may occur, including:  Infection.  Skin damage around the catheter.  Lung damage.  Failure of the chest tube to work properly.  Spreading of cancer cells along the catheter, if you have cancer. Supplies needed:  Vacuum-sealed drainage bottle with attached drainage line.  Sterile dressing.  Sterile alcohol pads.  Sterile gloves.  Valve cap.  Sterile gauze pads, 4  4 inch (10 cm  10 cm).  Tape.  Adhesive dressing.  Sterile foam catheter pad. How to care for your catheter and insertion site  Wash your hands with soap and warm water before and after touching the catheter or insertion site. If soap and water are not available, use hand sanitizer.  Check your bandage (dressing) daily to make sure it is clean and dry.  Keep the skin around the catheter  clean and dry.  Check the catheter regularly for any cracks or kinks in the tubing.  Check your catheter insertion site every day for signs of infection. Check for: ? Skin breakdown. ? Redness, swelling, or pain. ? Fluid or blood. ? Warmth. ? Pus or a bad smell. How to drain your catheter You may need to drain your catheter every day, or more or less often as told by your health care provider. Follow instructions from your health care provider about how to drain your catheter. You may also refer to instructions that come with the drainage system. To drain the catheter: 1. Wash your hands with soap and warm water. If soap and water are not available, use hand sanitizer. 2. Carefully remove the dressing from around the catheter. 3. Wash your hands again. 4. Put on the gloves provided. 5. Prepare the vacuum-sealed drainage bottle and drainage line. Close the drainage line of the vacuum-sealed drainage bottle by squeezing the pinch clamp or rolling the wheel of the roller clamp toward the bottle. The vacuum in the bottle will be lost if the line is not closed completely. 6. Remove the access tip cover from the drainage line. Do not touch the end. Set it on a sterile surface. 7. Remove the catheter valve cap and throw it away. 8. Use an alcohol pad to clean the end of the catheter. 9. Insert the access tip into the catheter valve. Make sure the valve and access tip are securely connected. Listen for a click to  confirm that they are connected. 10. Insert the T plunger to break the vacuum seal on the drainage bottle. 11. Open the clamp on the drainage line. 12. Allow the catheter to drain. Keep the catheter and the drainage bottle below the level of your chest. There may be a one-way valve on the end of the tubing that will allow liquid and air to flow out of the catheter without letting air inside. 13. Drain the amount of fluid as told by your health care provider. It usually takes 5-15 minutes. Do  not drain more than 1000 mL of fluid. You may feel a little discomfort while you are draining. If the pain is severe, stop draining and contact your health care provider. 14. After you finish draining the catheter, remove the drainage bottle tubing from the catheter. 15. Use a clean alcohol pad to wipe the catheter tip. 16. Place a clean cap on the end of the catheter. 17. Use an alcohol pad to clean the skin around the catheter. 18. Allow the skin to air-dry. 19. Put the catheter pad on your skin. Curl the catheter into loops and place it on the pad. Do not place the catheter on your skin. 20. Replace the dressing over the catheter. 21. Discard the drainage bottle as instructed by your health care provider. Do not reuse the drainage bottle. How to change your dressing Change your dressing at least once a week, or more often if needed to keep the dressing dry. Be sure to change the dressing whenever it becomes moist. Your health care provider will tell you how often to change your dressing. 1. Wash your hands with soap and warm water. If soap and water are not available, use hand sanitizer. 2. Gently remove the old dressing. Avoid using scissors to remove the dressing. Sharp objects may damage the catheter. 3. Wash the skin around the insertion site with mild, fragrance-free soap and warm water. Rinse well, then pat the area dry with a clean cloth. 4. Check the skin around the catheter for signs of infection. Check for: ? Skin breakdown. ? Redness, swelling, or pain. ? Fluid or blood. ? Warmth. ? Pus or a bad smell. 5. If your catheter was stitched (sutured) to your skin, look at the suture to make sure it is still anchored in your skin. 6. Do not apply creams, ointments, or alcohol to the area. Let your skin air-dry completely before you apply a new dressing. 7. Curl the catheter into loops and place it on the sterile catheter pad. Do not place the catheter on your skin. 8. If you do not have  a pad, use a clean dressing. Slide the dressing under the disk that holds the drainage catheter in place. 9. Use gauze to cover the catheter and the catheter pad. The catheter should rest on the pad or dressing, not on your skin. 10. Tape the dressing to your skin. You may be instructed to use an adhesive dressing covering instead of gauze and tape. 11. Wash your hands with soap and warm water. If soap and water are not available, use hand sanitizer. General recommendations  Always wash your hands with soap and warm water before and after caring for your catheter and drainage bottle. Use a mild, fragrance-free soap. If soap and water are not available, use hand sanitizer.  Always make sure there are no leaks in the catheter or drainage bottle.  Each time you drain the catheter, note the color and amount of fluid.  Do not touch the tip of the catheter or the drainage bottle tubing.  Do not reuse drainage bottles.  Do not take baths, swim, or use a hot tub until your health care provider approves. Ask your health care provider if you may take showers. You may only be allowed to take sponge baths.  Take deep breaths regularly, followed by a cough. Doing this can help to prevent lung infection. Contact a health care provider if:  You have any questions about caring for your catheter or drainage bottle.  You still have pain at the catheter insertion site more than 2 days after your procedure.  You have pain while draining your catheter.  Your catheter becomes bent, twisted, or cracked.  The connection between the catheter and the collection bottle becomes loose.  You have any of these around your catheter insertion site or coming from it: ? Skin breakdown. ? Redness, swelling, or pain. ? Fluid or blood. ? Warmth. ? Pus or a bad smell. Get help right away if:  You have a fever or chills.  You have chest pain.  You have dizziness or shortness of breath.  You have severe redness,  swelling, or pain at your catheter insertion site.  The catheter comes out.  The catheter is blocked or clogged. Summary  An indwelling pleural catheter is a thin, flexible tube that is inserted under your skin and into your chest. The catheter drains excess fluid that collects in the area between the chest wall and the lungs (pleural space).  It is important to follow instructions from your health care provider about how to drain and care for your catheter.  Do not touch the tip of the catheter or the drainage bottle tubing.  Always wash your hands with soap and water before and after caring for your catheter and drainage bottle. If soap and water are not available, use hand sanitizer. This information is not intended to replace advice given to you by your health care provider. Make sure you discuss any questions you have with your health care provider. Document Revised: 11/19/2019 Document Reviewed: 09/20/2019 Elsevier Patient Education  2021 [employer].     Suggestions For Increasing Calories And Protein . Several small meals a day are easier to eat and digest than three large ones. Space meals about 2 to 3 hours apart to maximize comfort. . Stop eating 2 to 3 hours before bed and sleep with your head elevated if gastric reflux (GERD) and heartburn are problems. . Do not eat your favorite foods if you are feeling bad. Save them for when you feel good! . Eat breakfast-type foods at any meal. Eggs are usually easy to eat and are great any time of the day. (The same goes for pancakes and waffles.) . Eat when you feel hungry. Most people have the greatest appetite in the morning because they have not eaten all night. If this is the best meal for you, then pile on those calories and other nutrients in the morning and at lunch. Then you can have a smaller dinner without losing total calories for the day. . Eat leftovers or nutritious snacks in the afternoon and early evening to round out  your day. . Try homemade or commercially prepared nutrition bars and puddings, as well as calorie- and protein-rich liquid nutritional supplements. Benefits of Physical Activity Talk to your doctor about physical activity. Light or moderate physical activity can help maintain muscle and promote an appetite. Walking in the neighborhood or the  local mall is a great way to get up, get out, and get moving. If you are unsteady on your feet, try walking around the dining room table. Save Room for [employer]! Drink most fluids between meals instead of with meals. (It is fine to have a sip to help swallow food at meal time.) Fluids (which usually have fewer calories and nutrients than solid food) can take up valuable space in your stomach.  Foods Recommended High-Protein Foods Milk products Add cheese to toast, crackers, sandwiches, baked potatoes, vegetables, soups, noodles, meat, and fruit. Use reduced-fat (2%) or whole milk in place of water when cooking cereal and cream soups. Include cream sauces on vegetables and pasta. Add powdered milk to cream soups and mashed potatoes.  Eggs Have hard-cooked eggs readily available in the refrigerator. Chop and add to salads, casseroles, soups, and vegetables. Make a quick egg salad. All eggs should be well cooked to avoid the risk of harmful bacteria.  Meats, poultry, and fish Add leftover cooked meats to soups, casseroles, salads, and omelets. Make dip by mixing diced, chopped, or shredded meat with sour cream and spices.  Beans, legumes, nuts, and seeds Sprinkle nuts and seeds on cereals, fruit, and desserts such as ice cream, pudding, and custard. Also serve nuts and seeds on vegetables, salads, and pasta. Spread peanut butter on toast, bread, English muffins, and fruit, or blend it in a milk shake. Add beans and peas to salads, soups, casseroles, and vegetable dishes.  High-Calorie Foods Butter, margarine, and  oils Melt butter or margarine over potatoes,  rice, pasta, and cooked vegetables. Add melted butter or margarine into soups and casseroles and spread on bread for sandwiches before spreading sandwich spread or peanut butter. Saut or stir-fry vegetables, meats, chicken and fish such as shrimp/scallops in olive or canola oil. A variety of oils add calories and can be used to [employer], chicken, or fish.  Milk products Add whipping cream to desserts, pancakes, waffles, fruit, and hot chocolate, and fold it into soups and casseroles. Add sour cream to baked potatoes and vegetables.  Salad dressing Use regular (not low-fat or diet) mayonnaise and salad dressing on sandwiches and in dips with vegetables and fruit.   Sweets Add jelly and honey to bread and crackers. Add jam to fruit and ice cream and as a topping over cake.   Copyright 2020  [employer]y [employer]. All rights reserved.

## 2020-06-06 NOTE — Progress Notes (Signed)
I saw the patient this morning. She would like to try a couple of herceptin infusions if possible before she decides she wants to completely go hospice route. This is reasonable. We will schedule her infusions and follow up. Her most recent ECHO showed a slight drop in EF, but overall normal. I will send message to scheduling to schedule her infusion for herceptin. It is still mysterious why she developed pleural effusion, especially since rest of the imaging showed response. She doesn't want to proceed with radiation.

## 2020-06-06 NOTE — Discharge Summary (Signed)
[facility] Discharge Summary   Patient: Morgan Yu VFI:[id number]  PCP: Patient, [name]  Date of admission: 05/27/2020   Date of discharge:  06/06/2020     Discharge Diagnoses:  Principal Problem:   Acute hypoxemic respiratory failure ([place]) Active Problems:   Bone metastases ([place])   Pleural effusion   Carcinoma of breast metastatic to bone [facility])   Anxiety state   Bilateral lower extremity edema   Hospice care patient   Admitted From: home Disposition:  Home with home health and palliative care support  Recommendations for Outpatient Follow-up:  1. PCP: Follow-up with PCP in 1 week 2. Follow-up with oncology as recommended. 3. Continue to engage with palliative care team outpatient. 4. Follow up LABS/TEST: Repeat chest x-ray in 1 week 5. New Meds: Tessalon Perles, Tussionex, oxycodone, Mucinex 6. Changed meds: Lasix 7. Stopped meds: Remeron   Follow-up Information    [facility] Follow up.   Specialty: Home Health Services Why: [facility] will follow you at home for [facility].  Contact information: [address] [place] [address] [phone]        [employer] Follow up.   Why: Your Oxygen is from this company. If you have any concerns or questions be call the above number. Contact information: [facility] [address] [place],  [address] [phone]             Discharge Instructions    Ambulatory Pleural Drainage Schedule   Complete by: As directed    Drain daily, up to max of 1L until patient is only able to drain out 154ml. If <165ml for 3 consecutive drains every other day, then call Interventional Radiology [phone]) for evaluation and possible removal.   Diet - low sodium heart healthy   Complete by: As directed    Diet - low sodium heart healthy   Complete by: As directed    Increase activity slowly   Complete by: As directed    Increase activity slowly   Complete by: As directed    No wound  care   Complete by: As directed    No wound care   Complete by: As directed       Diet recommendation: Regular diet  Activity: The patient is advised to gradually reintroduce usual activities, as tolerated  Discharge Condition: stable  Code Status: DNR   History of present illness: As per the H and P dictated on admission, "Morgan Yu is a 80 y.o. female with medical history significant of carcinoma of the breast with metastasis to the bones who is being followed by oncology on Herceptin and Arimidex subcutaneously who also is on Zometa for bone metastasis was seen today by her oncologist as perform a follow-up.  Patient found to have bilateral lower extremity edema as well as bilateral pleural effusion.  Patient was requiring oxygen up to 2 L.  Due to the size of her pleural effusion she was sent over to the ER for thoracentesis.  This was done with about 2 L of fluid removed.  Currently analysis of the fluid has been sent out.  Questionable whether this is malignant effusion or related to cardiac causes.  She has been admitted to work-up for possible cardiac cause of her pleural effusion.  She denied any fever or chills no cough.  No recent infection..  ED Course: Temperature 98.4 blood pressure 182/89 pulse 103 respirate 26 oxygen sat 89% on room air currently 97% on  2 L."  Hospital Course:  Summary of her active problems in the hospital is as following. 1.  Acute hypoxic respiratory failure, POA. Bilateral pleural effusion. Acute on chronic HFpEF Hypoalbuminemia 80% when she presented at cancer center. Currently on 4 LPM 92%. S/P thoracentesis 4/25, 4/28 with improvement in oxygenation. Due to recurrent nature underwent S/P Pleurx catheter placement. Chest x-ray on 4/6 shows improvement in left pleural effusion after bedside drainage of 500 cc.  Appears to have mild worsening of right-sided pleural effusion. Only LDH was sent from the pleural fluid but serum LDH was not sent  therefore unable to conclusively say that this is transudative fluid but appears to be transudative in nature based on appearance. Inpatient with metastatic breast cancer malignancy causing exudative effusion cannot be ruled out as well. Cytology is negative x2. Third spacing in the setting of hypoalbuminemia is also potential diagnosis. Continue Lasix on discharge. Repeat chest x-ray in 1 week.  2.  Metastatic breast cancer Followed by oncology. Currently on Herceptin. Patient significantly weak and lethargic.  Not sure whether she can tolerate chemotherapy further.  CT scan on 4/25 shows improvement in liver as well as bony sclerosis. Family and patient wants to consider hospice potentially.. At present patient wants to continue chemotherapy and see if it makes any difference in her symptoms and if she can tolerate it without any significant side effect since she is seeing benefits from chemotherapy with improvement in appetite and already CT verified improvement in mass and metastasis. Given her significant weakness and tiredness unable to tolerate back and forth trips to the hospital.  Therefore she is currently holding off on radiation.  3.  Chronic bilateral lower extremity edema Improving with diuresis. Monitor.  4.  Severe protein calorie malnutrition Cancer-related cachexia. Continue to monitor. Body mass index is 19.1 kg/m.  Nutrition Problem: Severe Malnutrition Etiology: chronic illness,cancer and cancer related treatments Interventions: Interventions: Snacks,MVI,Magic cup   5.  Cough Associated with her cancer. Supportive care.  6.  Goals of care conversation. Highly appreciate palliative care service specifically Dr. Kirstie Mirza assistance in this patient's management. Patient prefers to go home with hospice. Patient does not want to be back and forth between hospital or clinic for therapy or hospitalization. Patient wants to focus on comfort as her goal. But  also wants to continue chemotherapy right now.  We will continue with home health on discharge and palliative care will follow up on a regular basis for assistance.  Pain control  - [employer] Controlled Substance Reporting System database was reviewed. - 5 day supply was provided. - Patient was instructed, not to drive, operate heavy machinery, perform activities at heights, swimming or participation in water activities or provide baby sitting services while on Pain, Sleep and Anxiety Medications; until her outpatient Physician has advised to do so again.  - Also recommended to not to take more than prescribed Pain, Sleep and Anxiety Medications.  Patient was seen by physical therapy, who recommended Home Health. On the day of the discharge the patient's vitals were stable, and no other acute medical condition were reported by patient. The patient was felt safe to be discharge at Home with Home health.  Consultants: Palliative care  IR Oncology, radiation oncology Procedures: IR guided pleural drain placement  DISCHARGE MEDICATION: Allergies as of 06/06/2020      Reactions   Vicodin Hp [hydrocodone-acetaminophen]    Brain fog      Medication List    STOP taking these medications  mirtazapine 15 MG tablet Commonly known as: REMERON     TAKE these medications   acetaminophen 500 MG tablet Commonly known as: TYLENOL Take 500 mg by mouth every 6 (six) hours as needed for moderate pain.   anastrozole 1 MG tablet Commonly known as: ARIMIDEX TAKE 1 TABLET BY MOUTH EVERY DAY   benzonatate 100 MG capsule Commonly known as: TESSALON Take 1 capsule (100 mg total) by mouth 3 (three) times daily.   chlorpheniramine-HYDROcodone 10-8 MG/5ML Suer Commonly known as: TUSSIONEX Take 5 mLs by mouth every 6 (six) hours as needed for cough (If unresponsive to Robitussin DM).   feeding supplement Liqd Take 237 mLs by mouth 3 (three) times daily between meals.   furosemide 40 MG  tablet Commonly known as: LASIX Take 1 tablet (40 mg total) by mouth daily. Start taking on: Jun 07, 2020 What changed:   medication strength  how much to take   guaiFENesin 600 MG 12 hr tablet Commonly known as: MUCINEX Take 1 tablet (600 mg total) by mouth 2 (two) times daily.   LORazepam 0.5 MG tablet Commonly known as: ATIVAN Take 1 tablet (0.5 mg total) by mouth every 8 (eight) hours as needed for anxiety. Take 1 hour prior to radiation therapy What changed: additional instructions   ondansetron 4 MG tablet Commonly known as: Zofran Take 1 tablet (4 mg total) by mouth every 8 (eight) hours as needed for nausea or vomiting.   oxyCODONE 5 MG immediate release tablet Commonly known as: Oxy IR/ROXICODONE Take 0.5-1 tablets (2.5-5 mg total) by mouth every 4 (four) hours as needed for severe pain (dyspnea).   oxymetazoline 0.05 % nasal spray Commonly known as: AFRIN Place 1 spray into both nostrils daily as needed for congestion.   senna 8.6 MG Tabs tablet Commonly known as: SENOKOT Take 8.6 mg by mouth daily as needed for mild constipation.   TYLENOL ALLERGY SINUS PO Take 1 tablet by mouth daily as needed (allergy).            Durable Medical Equipment  (From admission, onward)         Start     Ordered   06/06/20 1316  For home use only DME oxygen  Once       Question Answer Comment  Length of Need Lifetime   Mode or (Route) Nasal cannula   Liters per Minute 5   Frequency Continuous (stationary and portable oxygen unit needed)   Oxygen delivery system Gas      06/06/20 1315          Discharge Exam: [facility] Weights   05/27/20 1434 05/27/20 2038 06/04/20 1546  Weight: 51.5 kg 48.4 kg 45.9 kg   Vitals:   06/05/20 2118 06/06/20 0525  BP: (!) 125/52 (!) 137/56  Pulse: 86 81  Resp: 20 20  Temp: 97.7 F (36.5 C) 97.9 F (36.6 C)  SpO2: 96% 92%   General: Appear in mild distress, no Rash; Oral Mucosa Clear, moist. no Abnormal Neck Mass Or lumps,  Conjunctiva normal  Cardiovascular: S1 and S2 Present, no Murmur, Respiratory: Normal respiratory effort, Bilateral Air entry present and bilateral Crackles, no wheezes Abdomen: Bowel Sound present, Soft and no tenderness Extremities: trace Pedal edema Neurology: alert and oriented to time, place, and person affect appropriate. no new focal deficit Gait not checked due to patient safety concerns    The results of significant diagnostics from this hospitalization (including imaging, microbiology, ancillary and laboratory) are listed below for reference.  Significant Diagnostic Studies: DG Chest 2 View  Result Date: 06/02/2020 CLINICAL DATA:  Pleural effusions.  Breast carcinoma EXAM: CHEST - 2 VIEW COMPARISON:  May 30, 2020 FINDINGS: Pleural effusions, larger on the left than on the right, essentially stable compared to prior study. Airspace opacity in portions of the inferior lingula and left lower lobe are again noted as is atelectatic change in the right base. Heart size and pulmonary vascularity are within normal limits. No adenopathy appreciable. There is aortic atherosclerosis. Multiple sclerotic bony metastases again noted. IMPRESSION: Persistent pleural effusions, larger on the left than on the right, stable. Lower lung region atelectatic change with suspected superimposed consolidation left lower lobe and potentially in a portion of the inferior lingula. Stable cardiac silhouette. Sclerotic bony metastases consistent with known breast carcinoma. Aortic Atherosclerosis (ICD10-I70.0). Electronically Signed   By: Lowella Grip III M.D.   On: 06/02/2020 11:04   DG Chest 2 View  Result Date: 05/30/2020 CLINICAL DATA:  Dyspnea EXAM: CHEST - 2 VIEW COMPARISON:  05/29/2020 FINDINGS: Increased moderate left pleural effusion with adjacent atelectasis. Increased mild right pleural effusion with adjacent atelectasis. There may be superimposed consolidation. Similar cardiomediastinal contours.  Sclerotic osseous metastatic disease. IMPRESSION: Increased left greater than right pleural effusions with adjacent atelectasis/consolidation. Electronically Signed   By: Macy Mis M.D.   On: 05/30/2020 11:40   DG Chest 2 View  Result Date: 05/29/2020 CLINICAL DATA:  Shortness of breath EXAM: CHEST - 2 VIEW COMPARISON:  May 27, 2020 FINDINGS: Pleural effusions bilaterally again noted. There is interstitial pulmonary edema with areas of upper lobe atelectatic change bilaterally. There is consolidation in portions of each lung base. Heart is mildly enlarged with a degree of pulmonary venous hypertension. No adenopathy. There is aortic atherosclerosis. Multiple sclerotic bone lesions identified consistent with metastatic disease from known breast carcinoma. IMPRESSION: 1. Pleural effusions bilaterally with a degree of interstitial edema. There is cardiomegaly with pulmonary vascular congestion. Appearance felt to be indicative of a degree of underlying congestive heart failure. 2. Bibasilar atelectasis. A degree of bibasilar consolidation is questioned. 3.  Sclerotic bony metastases again noted. 4.  Aortic Atherosclerosis (ICD10-I70.0). Electronically Signed   By: Lowella Grip III M.D.   On: 05/29/2020 11:43   CT CHEST ABDOMEN PELVIS W CONTRAST  Result Date: 05/27/2020 CLINICAL DATA:  Breast cancer with bone metastasis. Short of breath. Leg swelling. EXAM: CT CHEST, ABDOMEN, AND PELVIS WITH CONTRAST TECHNIQUE: Multidetector CT imaging of the chest, abdomen and pelvis was performed following the standard protocol during bolus administration of intravenous contrast. CONTRAST:  149mL OMNIPAQUE IOHEXOL 300 MG/ML  SOLN COMPARISON:  CT 02/27/2020 FINDINGS: CT CHEST FINDINGS Cardiovascular: No significant vascular findings. Normal heart size. No pericardial effusion. Mediastinum/Nodes: Enlarged LEFT axillary lymph node measures 16 mm compares to 20 mm. Several peripheral lymph nodes/nodules in the LEFT  axilla seen on comparison exam are no longer evident. Asymmetric thickened tissue in the LEFT breast measuring 16 mm compares to 19 mm. Lungs/Pleura: Nodule in the RIGHT upper lobe measuring 8 mm (image 54/series 4) compares to 7 mm. Smaller nodule in the RIGHT upper lobe measuring 4 mm on image 60 compares to 2 mm. There is interval increase in bilateral layering pleural effusions. Interval increase in bibasilar passive atelectasis. RIGHT lower lobe atelectasis greater than RIGHT. Musculoskeletal: Diffuse osseous metastasis again noted. The metastasis is diffusely sclerotic where previously lesions were mixed lytic and sclerotic. Several expansile lesions are noted. Example RIGHT lateral second rib measuring 20 mm compares to  25 mm. CT ABDOMEN AND PELVIS FINDINGS Hepatobiliary: Previously round enhancing lesions within the liver are now represented by hypoenhancing lesions. Overall volume of the liver is decrease measuring 19.9 cm in axial dimension (image 47/CT series 2) compared to 22 cm similar level on comparison exam. Pancreas: Pancreas is normal. No ductal dilatation. No pancreatic inflammation. Spleen: Normal spleen Adrenals/urinary tract: Thickening of the LEFT adrenal gland to 14 mm compares to 13 mm. RIGHT adrenal gland appears normal. Kidneys ureters and bladder normal. Stomach/Bowel: No bowel obstruction.  No bowel lesion identified Vascular/Lymphatic: Abdominal aorta is normal caliber with atherosclerotic calcification. There is no retroperitoneal or periportal lymphadenopathy. No pelvic lymphadenopathy. Reproductive: Uterus and adnexa unremarkable. Other: Interval increase in intraperitoneal free fluid. Musculoskeletal: As with the bones in the thorax, the previously mixed lytic and sclerotic metastatic disease has been near complete place by diffuse sclerotic pattern. IMPRESSION: 1. Overall there appears to be a positive response to therapy with decrease in size and number of LEFT axillary lymph  nodes and LEFT breast mass. Additionally the volume of the liver is decreased suggesting a positive response to diffuse hepatic metastasis. 2. Previously seen diffuse lytic and sclerotic skeletal metastasis have been replaced by uniform bone sclerosis also suggesting a positive response. Several of the expansile lesions are reduced in volume. 3. Similar RIGHT lung pulmonary nodules. 4. Interval increase in large volume pleural effusions with interval increase in bibasilar passive atelectasis. This coincides with patient's shortness of breath. These results will be called to the ordering clinician or representative by the Radiologist Assistant, and communication documented in the PACS or [employer]. Electronically Signed   By: Suzy Bouchard M.D.   On: 05/27/2020 09:33   DG Chest Port 1 View  Result Date: 05/29/2020 CLINICAL DATA:  80 year old female with bilateral pleural effusions status post right-sided thoracentesis. EXAM: PORTABLE CHEST 1 VIEW COMPARISON:  Earlier radiograph dated 05/29/2020 FINDINGS: Interval decrease in the size of the right pleural effusion. There is a small to moderate left pleural effusion and associated left lung base atelectasis. Pneumonia is not excluded. No pneumothorax. Stable cardiomediastinal silhouette. Osteopenia with degenerative changes of the spine. Sclerotic bone metastasis as seen on the earlier radiograph. Old right rib fractures. No acute osseous pathology. IMPRESSION: 1. Interval decrease in the size of the right pleural effusion. No pneumothorax. 2. Small to moderate left pleural effusion and associated left lung base atelectasis. Electronically Signed   By: Anner Crete M.D.   On: 05/29/2020 16:28   DG Chest Port 1 View  Result Date: 05/27/2020 CLINICAL DATA:  Shortness of breath and cough, left thoracentesis earlier today EXAM: PORTABLE CHEST 1 VIEW COMPARISON:  Same day chest radiograph at 1720 hours. FINDINGS: The heart size and mediastinal contours  are partially obscured but appear unchanged. Decreased versus redistributed moderate right pleural effusion with stable small left pleural effusion. Slightly improved expansion of the left lung otherwise persistent bilateral interstitial and airspace opacities. The visualized skeletal structures are unchanged. IMPRESSION: 1. Decreased versus redistributed moderate right pleural effusion with stable small left pleural effusion. 2. Slightly improved expansion of the left lung otherwise persistent bilateral interstitial and airspace opacities. Electronically Signed   By: Dahlia Bailiff MD   On: 05/27/2020 20:01   DG Chest Port 1 View  Result Date: 05/27/2020 CLINICAL DATA:  Shortness of breath, cough. EXAM: PORTABLE CHEST 1 VIEW COMPARISON:  February 29, 2020. FINDINGS: Stable cardiomediastinal silhouette. Significantly increased bibasilar opacities are noted concerning for mild pleural effusions and associated bibasilar edema  or atelectasis. Bony thorax is unremarkable. No pneumothorax is noted. IMPRESSION: Increased bibasilar opacities are noted concerning for worsening pleural effusions and bibasilar edema or atelectasis. Electronically Signed   By: Marijo Conception M.D.   On: 05/27/2020 17:53   ECHOCARDIOGRAM COMPLETE  Result Date: 05/28/2020    ECHOCARDIOGRAM REPORT   Patient Name:   Morgan Yu Date of Exam: 05/28/2020 Medical Rec #:  [id number]      Height:       61.0 in Accession #:    [id number]     Weight:       106.7 lb Date of Birth:  January 28, 1941       BSA:          1.446 m Patient Age:    23 years       BP:           116/54 mmHg Patient Gender: F              HR:           95 bpm. Exam Location:  Inpatient Procedure: 2D Echo Indications:    CHF-Acute Diastolic E26.83  History:        Patient has prior history of Echocardiogram examinations, most                 recent 02/28/2020.  Sonographer:    Mikki Santee RDCS (AE) Referring Phys: [place]  1. Left ventricular ejection  fraction, by estimation, is 60 to 65%. The left ventricle has normal function. The left ventricle has no regional wall motion abnormalities. Left ventricular diastolic parameters are indeterminate.  2. Right ventricular systolic function is normal. The right ventricular size is normal. Tricuspid regurgitation signal is inadequate for assessing PA pressure.  3. A small pericardial effusion is present.  4. The mitral valve is normal in structure. No evidence of mitral valve regurgitation.  5. The aortic valve was not well visualized. Aortic valve regurgitation is not visualized. No aortic stenosis is present.  6. The inferior vena cava is normal in size with greater than 50% respiratory variability, suggesting right atrial pressure of 3 mmHg. FINDINGS  Left Ventricle: Left ventricular ejection fraction, by estimation, is 60 to 65%. The left ventricle has normal function. The left ventricle has no regional wall motion abnormalities. The left ventricular internal cavity size was normal in size. There is  no left ventricular hypertrophy. Left ventricular diastolic parameters are indeterminate. Right Ventricle: The right ventricular size is normal. No increase in right ventricular wall thickness. Right ventricular systolic function is normal. Tricuspid regurgitation signal is inadequate for assessing PA pressure. Left Atrium: Left atrial size was normal in size. Right Atrium: Right atrial size was normal in size. Pericardium: A small pericardial effusion is present. Mitral Valve: The mitral valve is normal in structure. No evidence of mitral valve regurgitation. Tricuspid Valve: The tricuspid valve is normal in structure. Tricuspid valve regurgitation is not demonstrated. Aortic Valve: The aortic valve was not well visualized. Aortic valve regurgitation is not visualized. No aortic stenosis is present. Pulmonic Valve: The pulmonic valve was not well visualized. Pulmonic valve regurgitation is not visualized. Aorta: The  aortic root is normal in size and structure. Venous: The inferior vena cava is normal in size with greater than 50% respiratory variability, suggesting right atrial pressure of 3 mmHg. IAS/Shunts: The interatrial septum was not well visualized.  LEFT VENTRICLE PLAX 2D LVIDd:         3.70 cm  Diastology LVIDs:         2.50 cm  LV e' medial:    7.27 cm/s LV PW:         1.00 cm  LV E/e' medial:  14.3 LV IVS:        1.00 cm  LV e' lateral:   6.42 cm/s LVOT diam:     1.90 cm  LV E/e' lateral: 16.2 LV SV:         66 LV SV Index:   45 LVOT Area:     2.84 cm  RIGHT VENTRICLE RV S prime:     14.20 cm/s TAPSE (M-mode): 1.8 cm LEFT ATRIUM             Index       RIGHT ATRIUM          Index LA diam:        3.10 cm 2.14 cm/m  RA Area:     9.61 cm LA Vol (A2C):   48.4 ml 33.47 ml/m RA Volume:   17.90 ml 12.38 ml/m LA Vol (A4C):   38.8 ml 26.83 ml/m LA Biplane Vol: 46.0 ml 31.81 ml/m  AORTIC VALVE LVOT Vmax:   104.00 cm/s LVOT Vmean:  77.000 cm/s LVOT VTI:    0.232 m  AORTA Ao Root diam: 2.60 cm MITRAL VALVE MV Area (PHT): 3.27 cm     SHUNTS MV Decel Time: 232 msec     Systemic VTI:  0.23 m MV E velocity: 104.00 cm/s  Systemic Diam: 1.90 cm MV A velocity: 116.00 cm/s MV E/A ratio:  0.90 Oswaldo Milian MD Electronically signed by Oswaldo Milian MD Signature Date/Time: 05/28/2020/1:05:06 PM    Final    IR PERC PLEURAL DRAIN W/INDWELL CATH W/IMG GUIDE  Result Date: 06/03/2020 CLINICAL DATA:  History of metastatic breast cancer, now with recurrent symptomatic left-sided pleural effusion. Please perform image guided placement of a tunneled pleural drainage catheter for palliative purposes. EXAM: INSERTION OF TUNNELED LEFT SIDED PLEURAL DRAINAGE CATHETER COMPARISON:  Ultrasound-guided left-sided thoracentesis-05/27/2020; chest CT-05/27/2020; chest radiograph-06/02/2020 MEDICATIONS: Patient is currently admitted to the hospital receiving intravenous antibiotics; Antibiotic was administered in an appropriate time  interval for the procedure. ANESTHESIA/SEDATION: Moderate (conscious) sedation was employed during this procedure. A total of Versed 1 mg and Fentanyl 100 mcg was administered intravenously. Moderate Sedation Time: 17 minutes. The patient's level of consciousness and vital signs were monitored continuously by radiology nursing throughout the procedure under my direct supervision. FLUOROSCOPY TIME:  FLUOROSCOPY TIME 24 seconds (17 mGy) COMPLICATIONS: None immediate. PROCEDURE: The procedure, risks, benefits, and alternatives were explained to the patient, who wishes to proceed with the placement of this permanent pleural catheter as they are seeking palliative care. The patient understands and consents to the procedure. The inferolateral aspect of the left chest and upper abdomen were prepped and draped in a sterile fashion, and a sterile drape was applied covering the operative field. A sterile gown and sterile gloves were used for the procedure. Initial ultrasound scanning and fluoroscopic imaging demonstrates a recurrent moderate to large pleural effusion. Under direct ultrasound guidance, the inferior lateral pleural space was accessed with a Yueh sheath needle after the overlying soft tissues were anesthetized with 1% lidocaine with epinephrine. An Amplatz super stiff wire was then advanced under fluoroscopy into the pleural space. A 15.5 French tunneled Pleur-X catheter was tunneled from an incision within the left upper abdominal quadrant to the access site. The pleural access site was serially dilated under fluoroscopy, ultimately  allowing placement of a peel-away sheath. The catheter was advanced through the peel-away sheath. The sheath was then removed. Final catheter positioning was confirmed with a fluoroscopic radiographic image. The access incision was closed with an interrupted subcutaneous subcuticular 4-0 Vicryl, Dermabond and Steri-Strips. A Prolene retention suture was applied at the catheter exit  site. Large volume thoracentesis was performed through the new catheter utilizing provided bulb vacuum assisted drainage bag. Dressings were applied. The patient tolerated the above procedure well without immediate postprocedural complication. FINDINGS: Preprocedural ultrasound scanning demonstrates a recurrent large sized anechoic left sided pleural effusion. After ultrasound and fluoroscopic guided placement, the catheter is directed towards the left lung apex. Following catheter placement, approximately 1 L of serous pleural fluid was removed. IMPRESSION: Successful placement of permanent, tunneled left pleural drainage catheter via lateral approach. 1 L of serous pleural fluid was removed following catheter placement. Electronically Signed   By: Sandi Mariscal M.D.   On: 06/03/2020 15:28   US THORACENTESIS ASP PLEURAL SPACE W/IMG GUIDE  Result Date: 05/30/2020 INDICATION: Bilateral pleural effusion. Request for therapeutic and diagnostic thoracentesis. EXAM: ULTRASOUND GUIDED RIGHT THORACENTESIS MEDICATIONS: 10 mL 1% lidocaine COMPLICATIONS: None immediate. PROCEDURE: An ultrasound guided thoracentesis was thoroughly discussed with the patient and questions answered. The benefits, risks, alternatives and complications were also discussed. The patient understands and wishes to proceed with the procedure. Written consent was obtained. Ultrasound was performed to localize and mark an adequate pocket of fluid in the right chest. The area was then prepped and draped in the normal sterile fashion. 1% Lidocaine was used for local anesthesia. Under ultrasound guidance a 6 Fr Safe-T-Centesis catheter was introduced. Thoracentesis was performed. The catheter was removed and a dressing applied. FINDINGS: A total of approximately 900 cc of clear yellow fluid was removed. Samples were sent to the laboratory as requested by the clinical team. Post procedure chest X-ray reviewed, negative for pneumothorax. IMPRESSION:  Successful ultrasound guided right thoracentesis yielding 900 cc of pleural fluid. Read by: Durenda Guthrie, PA-C Electronically Signed   By: Markus Daft M.D.   On: 05/30/2020 07:47   US THORACENTESIS ASP PLEURAL SPACE W/IMG GUIDE  Result Date: 05/27/2020 INDICATION: Shortness of breath, left pleural effusion seen on previous CT scan. Request for therapeutic and diagnostic thoracentesis. EXAM: ULTRASOUND GUIDED LEFT THORACENTESIS MEDICATIONS: 1% lidocaine COMPLICATIONS: None immediate. PROCEDURE: An ultrasound guided thoracentesis was thoroughly discussed with the patient and questions answered. The benefits, risks, alternatives and complications were also discussed. The patient understands and wishes to proceed with the procedure. Written consent was obtained. Ultrasound was performed to localize and mark an adequate pocket of fluid in the left chest. The area was then prepped and draped in the normal sterile fashion. 1% Lidocaine was used for local anesthesia. Under ultrasound guidance a 6 Fr Safe-T-Centesis catheter was introduced. Thoracentesis was performed. The catheter was removed and a dressing applied. FINDINGS: A total of approximately 950 cc of clear yellow fluid was removed. Samples were sent to the laboratory as requested by the clinical team. Post procedure chest x-ray ordered. IMPRESSION: Successful ultrasound guided left thoracentesis yielding 950 cc of pleural fluid. Read by: Durenda Guthrie, PA-C Electronically Signed   By: Markus Daft M.D.   On: 05/27/2020 16:51    Microbiology: Recent Results (from the past 240 hour(s))  Resp Panel by RT-PCR (Flu A&B, Covid) Nasopharyngeal Swab     Status: None   Collection Time: 05/27/20  4:17 PM   Specimen: Nasopharyngeal Swab; Nasopharyngeal(NP) swabs in vial transport  medium  Result Value Ref Range Status   SARS Coronavirus 2 by RT PCR NEGATIVE NEGATIVE Final    Comment: (NOTE) SARS-CoV-2 target nucleic acids are NOT DETECTED.  The SARS-CoV-2 RNA is  generally detectable in upper respiratory specimens during the acute phase of infection. The lowest concentration of SARS-CoV-2 viral copies this assay can detect is 138 copies/mL. A negative result does not preclude SARS-Cov-2 infection and should not be used as the sole basis for treatment or other patient management decisions. A negative result may occur with  improper specimen collection/handling, submission of specimen other than nasopharyngeal swab, presence of viral mutation(s) within the areas targeted by this assay, and inadequate number of viral copies(<138 copies/mL). A negative result must be combined with clinical observations, patient history, and epidemiological information. The expected result is Negative.  Fact Sheet for Patients:  EntrepreneurPulse.com.au  Fact Sheet for Healthcare Providers:  IncredibleEmployment.be  This test is no t yet approved or cleared by the [place] FDA and  has been authorized for detection and/or diagnosis of SARS-CoV-2 by FDA under an Emergency Use Authorization (EUA). This EUA will remain  in effect (meaning this test can be used) for the duration of the COVID-19 declaration under Section 564(b)(1) of the Act, 21 U.S.C.section 360bbb-3(b)(1), unless the authorization is terminated  or revoked sooner.       Influenza A by PCR NEGATIVE NEGATIVE Final   Influenza B by PCR NEGATIVE NEGATIVE Final    Comment: (NOTE) The Xpert Xpress SARS-CoV-2/FLU/RSV plus assay is intended as an aid in the diagnosis of influenza from Nasopharyngeal swab specimens and should not be used as a sole basis for treatment. Nasal washings and aspirates are unacceptable for Xpert Xpress SARS-CoV-2/FLU/RSV testing.  Fact Sheet for Patients: EntrepreneurPulse.com.au  Fact Sheet for Healthcare Providers: IncredibleEmployment.be  This test is not yet approved or cleared by the [place] FDA and has been authorized for detection and/or diagnosis of SARS-CoV-2 by FDA under an Emergency Use Authorization (EUA). This EUA will remain in effect (meaning this test can be used) for the duration of the COVID-19 declaration under Section 564(b)(1) of the Act, 21 U.S.C. section 360bbb-3(b)(1), unless the authorization is terminated or revoked.  Performed at [facility], [place] [address], [place], [place] [address]   Body fluid culture w Gram Stain     Status: None   Collection Time: 05/27/20  4:48 PM   Specimen: Lung, Left; Pleural Fluid  Result Value Ref Range Status   Specimen Description   Final    PLEURAL LEFT Performed at [place] [address], [place], [place] [address]    Special Requests   Final    NONE Performed at [facility], [place] [address] [place], [place] [address]    Gram Stain   Final    RARE WBC PRESENT, PREDOMINANTLY MONONUCLEAR NO ORGANISMS SEEN    Culture   Final    NO GROWTH 3 DAYS Performed at [facility], [place] [address] [place], [place] [address]    Report Status 05/31/2020 FINAL  Final     Labs: CBC: Recent Labs  Lab 06/03/20 0337  WBC 8.8  NEUTROABS 6.1  HGB 10.9*  HCT 35.9*  MCV 94.0  PLT 427   Basic Metabolic Panel: Recent Labs  Lab 06/04/20 0327  NA 140  K 3.6  CL 99  CO2 33*  GLUCOSE 90  BUN 21  CREATININE 0.44  CALCIUM 7.8*   Liver  Function Tests: No results for input(s): AST, ALT, ALKPHOS, BILITOT, PROT, ALBUMIN in the last 168 hours. CBG: No results for input(s): GLUCAP in the last 168 hours.  Time spent: 35 minutes  Signed:  Berle Mull  [facility]  06/06/2020 3:48 PM

## 2020-06-06 NOTE — Progress Notes (Signed)
PHYSICAL THERAPY  Pt out of room for a chest X ray then I was unable to return later due to schedule - called to ICU to see another pt.  Pt has been evaluated with rec for Home with Hospice.   Rica Koyanagi  PTA Acute  Rehabilitation Services Pager      [phone] Office      [phone]

## 2020-06-06 NOTE — Care Management Important Message (Signed)
Important Message  Patient Details IM Letter given to the Patient. Name: Morgan Yu MRN: [id number] Date of Birth: [birth year]   Medicare Important Message Given:  Yes     Kerin Salen 06/06/2020, 11:41 AM

## 2020-06-06 NOTE — Progress Notes (Signed)
Manufacturing engineer (ACC)  Met with Morgan Yu and Morgan Yu to discuss d/c home with hospice.  Morgan Yu misunderstood initial conversations relating to Morgan Yu and inquired about continuing her "infusions" as she felt they were helping her feel better. She inquired if she could have hospice and "the infusions". Discussed with Dr. Domingo Cocking and Dr. Posey Pronto, they will coordinate with her oncologist to see what her recommendations are.  Discussed managing her pleurx drain at home, so far, it has not been drained since it was placed on 5/3. Pt is asymptomatic related to her respiratory status and is at her baseline per her report.  As of today, plan is to likely dc home with home health and we will follow with our palliative program.  Venia Carbon RN, BSN, [facility] Liaison

## 2020-06-06 NOTE — TOC Transition Note (Addendum)
Transition of Care [facility]) - CM/SW Discharge Note   Patient Details  Name: Morgan Yu MRN: [id number] Date of Birth: February 09, 1940  [facility] CM/SW Contact:  Ross Ludwig, LCSW Phone Number: 06/06/2020, 5:35 PM   Clinical Narrative:     CSW received phone call from [facility], [place] from care agency, [phone], he requested H and P, DC summary, and face sheet to be faxed to [phone].  Per Koleen Nimrod, care agency will be starting on Monday.  Patient will be going home with home health through [facility] is contact [phone].  [facility] will be following for palliative services.  Per home health agency their weekend scheduler will contact family to inform them of planned visit.  Family will be staying with patient over the weekend.  Patient has oxygen at home and does not need any other supplies.  CSW contacted [facility] in regards to patient's Pleurx catheter supplies.  Physician written order completed and faxed to [phone].  Per [facility] they will contact patient and family to set up account for supply delivery.  CSW signing off please reconsult with any other social work needs, home health agency has been notified of planned discharge.    Final next level of care: [place] Barriers to Discharge: Barriers Resolved   Patient Goals and CMS Choice Patient states their goals for this hospitalization and ongoing recovery are:: To return back home with palliative services. CMS Medicare.gov Compare Post Acute Care list provided to:: Patient Represenative (must comment) Choice offered to / list presented to : Adult Children  Discharge Placement  Patient discharging back home with home health and palliative to follow.                     Discharge Plan and Services In-house Referral: Clinical Social Work   Post Acute Care Choice: Home Health              Date DME Agency Contacted: 06/06/20 Time DME Agency Contacted: [place]: PT,OT,RN [place]: [facility] Date [place]: 06/06/20 Time [place]: [place] Representative spoke with at [place]: Lattie Haw at [place] ([place]) Interventions     Readmission Risk Interventions No flowsheet data found.

## 2020-06-06 NOTE — Progress Notes (Signed)
Daily Progress Note   Patient Name: Morgan Yu       Date: 06/06/2020 DOB: 1940-04-16  Age: 80 y.o. MRN#: [id number] Attending Physician: Lavina Hamman, MD Primary Care Physician: Patient, [name] Admit Date: 05/27/2020  Reason for Consultation/Follow-up: Establishing goals of care  Subjective: I met today with Ms. Glauser and Edd Fabian in conjunction with Venia Carbon (liaison for [facility] collective).  Ms. Mcraney had told [facility] that she thinks that she may want to continue with infusions she has been receiving from Dr. Chryl Heck.  In talking with her today, she tells me that she is clear in decision not to continue with radiation, however, she believes she is benefiting from systemic therapy she has been receiving the infusion from Dr. Chryl Heck.    Yesterday, we had discussed Dr. Chryl Heck coming back up to talk with her further, but Ms. Taliaferro did not feel it was necessary at that time.  I recommended that if she is considering continuation of systemic therapy, the best next step would be to have Dr. Chryl Heck come to discuss potential benefit and burdens associated with this.  I reached out to Dr. Chryl Heck who will come to see her.  I assured her that her entire care team will continue to work to ensure that we are best meeting her needs with what ever plan fits best with her goals moving forward.  We discussed that likely best plan moving forward will be to work to transition home with home health, assuming she desires to continue with systemic therapy when she has a chance to speak further with Dr. Chryl Heck.  We also discussed plan for [facility] to continue to follow with her palliative care outpatient team for another layer of support for her moving forward.  Length of Stay: 10  Current  Medications: Scheduled Meds:  . anastrozole  1 mg Oral Daily  . benzonatate  100 mg Oral TID  . furosemide  40 mg Oral Daily  . guaiFENesin  600 mg Oral BID  . multivitamin with minerals  1 tablet Oral Daily    Continuous Infusions:   PRN Meds: chlorpheniramine-HYDROcodone, guaiFENesin-dextromethorphan, loperamide, LORazepam, ondansetron **OR** ondansetron (ZOFRAN) IV, senna, sodium chloride  Physical Exam         General: Alert, awake, in no acute distress.  HEENT: No bruits,  no goiter, no JVD Heart: Regular rate and rhythm. No murmur appreciated. Lungs: Good air movement, clear Abdomen: Soft, nontender, nondistended, positive bowel sounds.  Ext: No significant edema Skin: Warm and dry  Vital Signs: BP (!) 137/56   Pulse 81   Temp 97.9 F (36.6 C) (Oral)   Resp 20   Ht _0  (1.549 m)   Wt 45.9 kg   SpO2 92%   BMI 19.10 kg/m  SpO2: SpO2: 92 % O2 Device: O2 Device: Nasal Cannula O2 Flow Rate: O2 Flow Rate (L/min): 4 L/min  Intake/output summary:   Intake/Output Summary (Last 24 hours) at 06/06/2020 1109 Last data filed at 06/06/2020 1610 Gross per 24 hour  Intake 120 ml  Output 500 ml  Net -380 ml   LBM: Last BM Date: 06/05/20 Baseline Weight: Weight: 51.5 kg Most recent weight: Weight: 45.9 kg       Palliative Assessment/Data: Palliative performance scale 50%   Flowsheet Rows   Flowsheet Row Most Recent Value  Intake Tab   Referral Department Hospitalist  Unit at Time of Referral Other (Comment)  Palliative Care Primary Diagnosis Cancer  Date Notified 05/30/20  Palliative Care Type Return patient Palliative Care  Reason for referral Clarify Goals of Care  Date of Admission 05/27/20  Date first seen by Palliative Care 05/31/20  # of days Palliative referral response time 1 Day(s)  # of days IP prior to Palliative referral 3  Clinical Assessment   Psychosocial & Spiritual Assessment   Palliative Care Outcomes       Patient Active Problem List    Diagnosis Date Noted  . Hospice care patient 06/05/2020  . Acute hypoxemic respiratory failure ([place]) 05/28/2020  . Lymphedema 05/27/2020  . Bilateral lower extremity edema 05/27/2020  . Malignant pleural effusion 05/27/2020  . Anxiety state 05/21/2020  . Carcinoma of breast metastatic to bone ([name]) 03/19/2020  . Protein-calorie malnutrition, severe 03/04/2020  . Pressure injury of skin 02/28/2020  . Bone metastases ([place]) 02/27/2020  . Elevated LFTs 02/27/2020  . Elevated troponin 02/27/2020  . Pleural effusion 02/27/2020  . Breast mass, left 02/27/2020  . Sinus tachycardia 02/27/2020    Palliative Care Assessment & Plan   Patient Profile:  80 year old lady who lives by herself in [place], [place].  Patient has a cousin Doristine Section who is her healthcare power of attorney agent.  Patient has a life limiting illness of metastatic breast cancer she is followed by oncology she is on Herceptin and Arimidex.  She is also undergoing palliative radiation.  Patient presented with bilateral pleural effusions and acute hypoxic respiratory failure.  Patient's has undergone thoracentesis and extensive fluid removal.  Patient noted to have rapid reaccumulation of fluid on repeat chest x-ray.  Palliative consultation for goals of care discussions has been requested.  Recommendations/Plan: - I met today with Ms. Payes in conjunction with hospice liaison.  She is considering if she would like to continue to pursue systemic treatment for her cancer.  I recommended that Dr. Chryl Heck come to discuss with her regarding benefits and burdens.  She will likely transition home with home health rather than hospice at time of discharge. -She is currently on service with [facility] collective's outpatient palliative care arm. Recommend they continue to follow at discharge Code Status:    Code Status Orders  (From admission, onward)         Start     Ordered   05/27/20 1919  Do not attempt resuscitation  (DNR)  Continuous  Question Answer Comment  In the event of cardiac or respiratory ARREST Do not call a "code blue"   In the event of cardiac or respiratory ARREST Do not perform Intubation, CPR, defibrillation or ACLS   In the event of cardiac or respiratory ARREST Use medication by any route, position, wound care, and other measures to relive pain and suffering. May use oxygen, suction and manual treatment of airway obstruction as needed for comfort.      05/27/20 1918        Code Status History    Date Active Date Inactive Code Status Order ID Comments User Context   02/28/2020 0727 03/05/2020 1705 DNR [id number]  Antonieta Pert, MD ED   02/27/2020 2049 02/28/2020 0727 Full Code [id number]  Vianne Bulls, MD ED   Advance Care Planning Activity    Advance Directive Documentation   Flowsheet Row Most Recent Value  Type of Advance Directive Healthcare Power of Attorney, Living will  Pre-existing out of facility DNR order (yellow form or pink MOST form) --  "MOST" Form in Place? --        Discharge Planning:  Home with home health and OP palliative care through [facility].   Care plan was discussed with patient.    Thank you for allowing the Palliative Medicine Team to assist in the care of this patient.   Total Time 40 Prolonged Time Billed No    Greater than 50%  of this time was spent counseling and coordinating care related to the above assessment and plan.  Micheline Rough, MD  Please contact Palliative Medicine Team phone at [phone] for questions and concerns.

## 2020-06-06 NOTE — Plan of Care (Signed)
  Problem: Clinical Measurements: Goal: Respiratory complications will improve Outcome: Progressing   Problem: Activity: Goal: Risk for activity intolerance will decrease Outcome: Progressing   Problem: Coping: Goal: Level of anxiety will decrease Outcome: Progressing   

## 2020-06-08 DIAGNOSIS — I071 Rheumatic tricuspid insufficiency: Secondary | ICD-10-CM | POA: Diagnosis not present

## 2020-06-08 DIAGNOSIS — C787 Secondary malignant neoplasm of liver and intrahepatic bile duct: Secondary | ICD-10-CM | POA: Diagnosis not present

## 2020-06-08 DIAGNOSIS — J42 Unspecified chronic bronchitis: Secondary | ICD-10-CM | POA: Diagnosis not present

## 2020-06-08 DIAGNOSIS — Z79899 Other long term (current) drug therapy: Secondary | ICD-10-CM | POA: Diagnosis not present

## 2020-06-08 DIAGNOSIS — M6281 Muscle weakness (generalized): Secondary | ICD-10-CM | POA: Diagnosis not present

## 2020-06-08 DIAGNOSIS — Z9181 History of falling: Secondary | ICD-10-CM | POA: Diagnosis not present

## 2020-06-08 DIAGNOSIS — I058 Other rheumatic mitral valve diseases: Secondary | ICD-10-CM | POA: Diagnosis not present

## 2020-06-08 DIAGNOSIS — C50912 Malignant neoplasm of unspecified site of left female breast: Secondary | ICD-10-CM | POA: Diagnosis not present

## 2020-06-08 DIAGNOSIS — M5431 Sciatica, right side: Secondary | ICD-10-CM | POA: Diagnosis not present

## 2020-06-08 DIAGNOSIS — J910 Malignant pleural effusion: Secondary | ICD-10-CM | POA: Diagnosis not present

## 2020-06-09 ENCOUNTER — Ambulatory Visit: Payer: Medicare HMO

## 2020-06-09 ENCOUNTER — Telehealth: Payer: Self-pay | Admitting: Hematology and Oncology

## 2020-06-09 DIAGNOSIS — M6281 Muscle weakness (generalized): Secondary | ICD-10-CM | POA: Diagnosis not present

## 2020-06-09 DIAGNOSIS — I058 Other rheumatic mitral valve diseases: Secondary | ICD-10-CM | POA: Diagnosis not present

## 2020-06-09 DIAGNOSIS — C50912 Malignant neoplasm of unspecified site of left female breast: Secondary | ICD-10-CM | POA: Diagnosis not present

## 2020-06-09 DIAGNOSIS — J910 Malignant pleural effusion: Secondary | ICD-10-CM | POA: Diagnosis not present

## 2020-06-09 DIAGNOSIS — M5431 Sciatica, right side: Secondary | ICD-10-CM | POA: Diagnosis not present

## 2020-06-09 DIAGNOSIS — C787 Secondary malignant neoplasm of liver and intrahepatic bile duct: Secondary | ICD-10-CM | POA: Diagnosis not present

## 2020-06-09 DIAGNOSIS — I071 Rheumatic tricuspid insufficiency: Secondary | ICD-10-CM | POA: Diagnosis not present

## 2020-06-09 DIAGNOSIS — Z79899 Other long term (current) drug therapy: Secondary | ICD-10-CM | POA: Diagnosis not present

## 2020-06-09 DIAGNOSIS — Z9181 History of falling: Secondary | ICD-10-CM | POA: Diagnosis not present

## 2020-06-09 DIAGNOSIS — J42 Unspecified chronic bronchitis: Secondary | ICD-10-CM | POA: Diagnosis not present

## 2020-06-09 NOTE — Telephone Encounter (Signed)
Scheduled appts per 5/6 sch msg. Pt aware.

## 2020-06-10 ENCOUNTER — Inpatient Hospital Stay: Payer: Medicare HMO | Admitting: Hematology and Oncology

## 2020-06-10 ENCOUNTER — Ambulatory Visit: Payer: Medicare HMO

## 2020-06-10 DIAGNOSIS — C787 Secondary malignant neoplasm of liver and intrahepatic bile duct: Secondary | ICD-10-CM | POA: Diagnosis not present

## 2020-06-10 DIAGNOSIS — Z9181 History of falling: Secondary | ICD-10-CM | POA: Diagnosis not present

## 2020-06-10 DIAGNOSIS — M6281 Muscle weakness (generalized): Secondary | ICD-10-CM | POA: Diagnosis not present

## 2020-06-10 DIAGNOSIS — Z79899 Other long term (current) drug therapy: Secondary | ICD-10-CM | POA: Diagnosis not present

## 2020-06-10 DIAGNOSIS — J42 Unspecified chronic bronchitis: Secondary | ICD-10-CM | POA: Diagnosis not present

## 2020-06-10 DIAGNOSIS — I058 Other rheumatic mitral valve diseases: Secondary | ICD-10-CM | POA: Diagnosis not present

## 2020-06-10 DIAGNOSIS — I071 Rheumatic tricuspid insufficiency: Secondary | ICD-10-CM | POA: Diagnosis not present

## 2020-06-10 DIAGNOSIS — J910 Malignant pleural effusion: Secondary | ICD-10-CM | POA: Diagnosis not present

## 2020-06-10 DIAGNOSIS — M5431 Sciatica, right side: Secondary | ICD-10-CM | POA: Diagnosis not present

## 2020-06-10 DIAGNOSIS — C50912 Malignant neoplasm of unspecified site of left female breast: Secondary | ICD-10-CM | POA: Diagnosis not present

## 2020-06-11 ENCOUNTER — Ambulatory Visit: Payer: Medicare HMO

## 2020-06-11 ENCOUNTER — Telehealth: Payer: Self-pay | Admitting: Student

## 2020-06-11 NOTE — Telephone Encounter (Signed)
Palliative NP spoke with POA Gayle. She states patient is in need of humidifier bottle for oxygen concentrator; will follow up with admin. As oxygen was supplied with plan of going home with hospice. Patient has decided to continue Herceptin infusions, next appt on 5/17. Patient has [facility] HH coming out; new pleurx drain. Palliative appt scheduled for 5/24 at 230pm

## 2020-06-12 ENCOUNTER — Ambulatory Visit: Payer: Medicare HMO

## 2020-06-12 DIAGNOSIS — M5431 Sciatica, right side: Secondary | ICD-10-CM | POA: Diagnosis not present

## 2020-06-12 DIAGNOSIS — C50912 Malignant neoplasm of unspecified site of left female breast: Secondary | ICD-10-CM | POA: Diagnosis not present

## 2020-06-12 DIAGNOSIS — I058 Other rheumatic mitral valve diseases: Secondary | ICD-10-CM | POA: Diagnosis not present

## 2020-06-12 DIAGNOSIS — Z9181 History of falling: Secondary | ICD-10-CM | POA: Diagnosis not present

## 2020-06-12 DIAGNOSIS — C787 Secondary malignant neoplasm of liver and intrahepatic bile duct: Secondary | ICD-10-CM | POA: Diagnosis not present

## 2020-06-12 DIAGNOSIS — J910 Malignant pleural effusion: Secondary | ICD-10-CM | POA: Diagnosis not present

## 2020-06-12 DIAGNOSIS — I071 Rheumatic tricuspid insufficiency: Secondary | ICD-10-CM | POA: Diagnosis not present

## 2020-06-12 DIAGNOSIS — Z79899 Other long term (current) drug therapy: Secondary | ICD-10-CM | POA: Diagnosis not present

## 2020-06-12 DIAGNOSIS — J42 Unspecified chronic bronchitis: Secondary | ICD-10-CM | POA: Diagnosis not present

## 2020-06-12 DIAGNOSIS — M6281 Muscle weakness (generalized): Secondary | ICD-10-CM | POA: Diagnosis not present

## 2020-06-13 ENCOUNTER — Ambulatory Visit: Payer: Medicare HMO

## 2020-06-16 ENCOUNTER — Ambulatory Visit: Payer: Medicare HMO

## 2020-06-16 DIAGNOSIS — Z9181 History of falling: Secondary | ICD-10-CM | POA: Diagnosis not present

## 2020-06-16 DIAGNOSIS — J42 Unspecified chronic bronchitis: Secondary | ICD-10-CM | POA: Diagnosis not present

## 2020-06-16 DIAGNOSIS — Z79899 Other long term (current) drug therapy: Secondary | ICD-10-CM | POA: Diagnosis not present

## 2020-06-16 DIAGNOSIS — C50912 Malignant neoplasm of unspecified site of left female breast: Secondary | ICD-10-CM | POA: Diagnosis not present

## 2020-06-16 DIAGNOSIS — I058 Other rheumatic mitral valve diseases: Secondary | ICD-10-CM | POA: Diagnosis not present

## 2020-06-16 DIAGNOSIS — C787 Secondary malignant neoplasm of liver and intrahepatic bile duct: Secondary | ICD-10-CM | POA: Diagnosis not present

## 2020-06-16 DIAGNOSIS — M6281 Muscle weakness (generalized): Secondary | ICD-10-CM | POA: Diagnosis not present

## 2020-06-16 DIAGNOSIS — J910 Malignant pleural effusion: Secondary | ICD-10-CM | POA: Diagnosis not present

## 2020-06-16 DIAGNOSIS — M5431 Sciatica, right side: Secondary | ICD-10-CM | POA: Diagnosis not present

## 2020-06-16 DIAGNOSIS — I071 Rheumatic tricuspid insufficiency: Secondary | ICD-10-CM | POA: Diagnosis not present

## 2020-06-17 ENCOUNTER — Inpatient Hospital Stay: Payer: Medicare HMO | Attending: Hematology and Oncology

## 2020-06-17 ENCOUNTER — Encounter: Payer: Self-pay | Admitting: Hematology and Oncology

## 2020-06-17 ENCOUNTER — Other Ambulatory Visit: Payer: Self-pay

## 2020-06-17 ENCOUNTER — Inpatient Hospital Stay: Payer: Medicare HMO | Admitting: Hematology and Oncology

## 2020-06-17 ENCOUNTER — Ambulatory Visit: Payer: Medicare HMO

## 2020-06-17 ENCOUNTER — Other Ambulatory Visit: Payer: Self-pay | Admitting: *Deleted

## 2020-06-17 ENCOUNTER — Inpatient Hospital Stay: Payer: Medicare HMO

## 2020-06-17 DIAGNOSIS — Z800 Family history of malignant neoplasm of digestive organs: Secondary | ICD-10-CM | POA: Insufficient documentation

## 2020-06-17 DIAGNOSIS — I700 Atherosclerosis of aorta: Secondary | ICD-10-CM | POA: Diagnosis not present

## 2020-06-17 DIAGNOSIS — C50912 Malignant neoplasm of unspecified site of left female breast: Secondary | ICD-10-CM

## 2020-06-17 DIAGNOSIS — C50919 Malignant neoplasm of unspecified site of unspecified female breast: Secondary | ICD-10-CM

## 2020-06-17 DIAGNOSIS — Z5112 Encounter for antineoplastic immunotherapy: Secondary | ICD-10-CM | POA: Insufficient documentation

## 2020-06-17 DIAGNOSIS — R600 Localized edema: Secondary | ICD-10-CM | POA: Diagnosis not present

## 2020-06-17 DIAGNOSIS — F411 Generalized anxiety disorder: Secondary | ICD-10-CM

## 2020-06-17 DIAGNOSIS — J90 Pleural effusion, not elsewhere classified: Secondary | ICD-10-CM

## 2020-06-17 DIAGNOSIS — C7951 Secondary malignant neoplasm of bone: Secondary | ICD-10-CM

## 2020-06-17 DIAGNOSIS — Z79899 Other long term (current) drug therapy: Secondary | ICD-10-CM | POA: Diagnosis not present

## 2020-06-17 DIAGNOSIS — J910 Malignant pleural effusion: Secondary | ICD-10-CM | POA: Diagnosis not present

## 2020-06-17 LAB — BASIC METABOLIC PANEL
Anion gap: 10 (ref 5–15)
BUN: 14 mg/dL (ref 8–23)
CO2: 30 mmol/L (ref 22–32)
Calcium: 8.5 mg/dL — ABNORMAL LOW (ref 8.9–10.3)
Chloride: 102 mmol/L (ref 98–111)
Creatinine, Ser: 0.68 mg/dL (ref 0.44–1.00)
GFR, Estimated: >60 mL/min (ref >60)
Glucose, Bld: 188 mg/dL — ABNORMAL HIGH (ref 70–99)
Potassium: 3.6 mmol/L (ref 3.5–5.1)
Sodium: 142 mmol/L (ref 135–145)

## 2020-06-17 LAB — CBC WITH DIFFERENTIAL/PLATELET
Abs Immature Granulocytes: 0.03 10*3/uL (ref 0.00–0.07)
Basophils Absolute: 0 10*3/uL (ref 0.0–0.1)
Basophils Relative: 1 %
Eosinophils Absolute: 0.1 10*3/uL (ref 0.0–0.5)
Eosinophils Relative: 2 %
HCT: 30.4 % — ABNORMAL LOW (ref 36.0–46.0)
Hemoglobin: 9.8 g/dL — ABNORMAL LOW (ref 12.0–15.0)
Immature Granulocytes: 1 %
Lymphocytes Relative: 14 %
Lymphs Abs: 0.8 10*3/uL (ref 0.7–4.0)
MCH: 29 pg (ref 26.0–34.0)
MCHC: 32.2 g/dL (ref 30.0–36.0)
MCV: 89.9 fL (ref 80.0–100.0)
Monocytes Absolute: 0.7 10*3/uL (ref 0.1–1.0)
Monocytes Relative: 12 %
Neutro Abs: 4.2 10*3/uL (ref 1.7–7.7)
Neutrophils Relative %: 70 %
Platelets: 344 10*3/uL (ref 150–400)
RBC: 3.38 MIL/uL — ABNORMAL LOW (ref 3.87–5.11)
RDW: 15.6 % — ABNORMAL HIGH (ref 11.5–15.5)
WBC: 5.8 10*3/uL (ref 4.0–10.5)
nRBC: 0 % (ref 0.0–0.2)

## 2020-06-17 MED ORDER — SODIUM CHLORIDE 0.9 % IV SOLN
Freq: Once | INTRAVENOUS | Status: AC
  Filled 2020-06-17: qty 250

## 2020-06-17 MED ORDER — ZOLEDRONIC ACID 4 MG/100ML IV SOLN
INTRAVENOUS | Status: AC
  Filled 2020-06-17: qty 100

## 2020-06-17 MED ORDER — TRASTUZUMAB-ANNS CHEMO 150 MG IV SOLR
8.0000 mg/kg | Freq: Once | INTRAVENOUS | Status: AC
  Administered 2020-06-17: 399 mg via INTRAVENOUS
  Filled 2020-06-17: qty 19

## 2020-06-17 MED ORDER — ZOLEDRONIC ACID 4 MG/5ML IV CONC
3.5000 mg | Freq: Once | INTRAVENOUS | Status: AC
  Administered 2020-06-17: 3.5 mg via INTRAVENOUS
  Filled 2020-06-17 (×2): qty 4.38

## 2020-06-17 NOTE — Progress Notes (Signed)
[name] FOLLOW UP NOTE  Patient Care Team: Patient, [name] as PCP - General (General Practice) [name], MD as Consulting Physician (Hematology and Oncology)  CHIEF COMPLAINTS/PURPOSE OF CONSULTATION:   FU on metastatic breast cancer, recently hospitalized, planned infusion and visit today after discharge.  ASSESSMENT & PLAN:   Pleural effusion Etiology remains unclear.  Given the excellent response elsewhere from her ongoing treatment, we wondered if some of this pleural effusion could be related to her hypoalbuminemia versus malignant effusion.  Cytology negative.  She is currently status post Pleurx placement and is draining well.  She remains oxygen dependent.  Bone metastases (HCC) No new bone pains.  Bone mets improved on her last imaging.  We will proceed with Zometa as planned today and then we will switch to Zometa every 3 months after this.  Carcinoma of breast metastatic to bone [facility]) This is a very pleasant 80 year old female patient with ER positive, PR negative and HER2 amplified breast cancer currently on Arimidex and Herceptin given her age and PS responding really well to the current treatment plan here for follow-up after her recent hospitalization.  She is tolerating Herceptin relatively well.  Her most recent echo from the hospital did not show any dramatic change in her ejection fraction, hence we have discussed about continuing Herceptin.  We will have to reload her Herceptin today since it has been almost 6 weeks from the last infusion. Labs reviewed, satisfactory to proceed with treatment today.  She was hoping to try treatment for few more times, if she does not respond or if there is any evidence of progression of disease, she would consider comfort measures.  Bilateral lower extremity edema Continue Lasix 40 mg oral daily. Elevation of legs recommended.  No orders of the defined types were placed in this encounter. Message  sent to scheduling for FU and infusion appointments.  HISTORY OF PRESENTING ILLNESS:   Morgan Yu 80 y.o. female is here because of newly diagnosed metastatic breast cancer.  Oncology History  Carcinoma of breast metastatic to bone ([place])  03/19/2020 Initial Diagnosis   Carcinoma of breast metastatic to bone ([place])   03/24/2020 -  Chemotherapy    Patient is on Treatment Plan: BREAST TRASTUZUMAB Q21D      04/15/2020 Cancer Staging   Staging form: Breast, AJCC 8th Edition - Clinical stage from 04/15/2020: Stage IV (cTX, cNX, cM1, GX, ER+, PR-, HER2+) - Signed by [name], MD on 04/15/2020 Stage prefix: Initial diagnosis Histologic grading system: 3 grade system     Interim History  [name] is here for follow-up today after her recent hospitalization.  She was admitted for worsening shortness of breath, was found to have bilateral pleural effusion status post Pleurx catheter placement and also needed IV diuresis.  She arrived with [name] to the appointment today.  She is currently on 2 L of oxygen, can take it off for brief periods.  Her Pleurx has been drained about twice a week, last drainage was yesterday when she had about 600 cc.  She has a nurse who helps her at home with the drainage and also her cousin helps her. She has noticed some increasing swelling of her bilateral lower extremities, left arm swelling also may have gotten worse since hospitalization.  She otherwise is able to move around at home with a walker.  She definitely feels better compared to previous visit when she was admitted. No change in bowel habits, having bowel  habits daily.  No diarrhea. Urinating well. No new bone pains. Rest of the pertinent 10 point ROS reviewed and negative.   MEDICAL HISTORY:  Past Medical History:  Diagnosis Date  . Breast cancer ([place])    with bone metastasis    SURGICAL HISTORY: Past Surgical History:  Procedure Laterality Date  . IR PERC PLEURAL DRAIN W/INDWELL  CATH W/IMG GUIDE  06/03/2020    SOCIAL HISTORY: Social History   Socioeconomic History  . Marital status: Widowed    Spouse name: Not on file  . Number of children: Not on file  . Years of education: Not on file  . Highest education level: Not on file  Occupational History  . Not on file  Tobacco Use  . Smoking status: Never Smoker  . Smokeless tobacco: Never Used  Vaping Use  . Vaping Use: Never used  Substance and Sexual Activity  . Alcohol use: Not Currently  . Drug use: Never  . Sexual activity: Not on file  Other Topics Concern  . Not on file  Social History Narrative   No biological children   Has 2 stepchildren   Social Determinants of Health   Financial Resource Strain: Not on file  Food Insecurity: Not on file  Transportation Needs: Not on file  Physical Activity: Not on file  Stress: Not on file  Social Connections: Not on file  Intimate Partner Violence: Not on file    FAMILY HISTORY: Family History  Problem Relation Age of Onset  . Stroke Mother   . Heart attack Father   . Cancer Brother   . Esophageal cancer Maternal Aunt   . Breast cancer Maternal Aunt     ALLERGIES:  is allergic to vicodin hp [hydrocodone-acetaminophen].  MEDICATIONS:  Current Outpatient Medications  Medication Sig Dispense Refill  . anastrozole (ARIMIDEX) 1 MG tablet TAKE 1 TABLET BY MOUTH EVERY DAY (Patient taking differently: Take 1 mg by mouth daily.) 30 tablet 1  . furosemide (LASIX) 40 MG tablet Take 1 tablet (40 mg total) by mouth daily. 30 tablet 0  . oxyCODONE (OXY IR/ROXICODONE) 5 MG immediate release tablet Take 0.5-1 tablets (2.5-5 mg total) by mouth every 4 (four) hours as needed for severe pain (dyspnea). 15 tablet 0  . acetaminophen (TYLENOL) 500 MG tablet Take 500 mg by mouth every 6 (six) hours as needed for moderate pain. (Patient not taking: Reported on 06/17/2020)    . benzonatate (TESSALON) 100 MG capsule Take 1 capsule (100 mg total) by mouth 3 (three) times  daily. (Patient not taking: Reported on 06/17/2020) 20 capsule 0  . Chlorphen-Pseudoephed-APAP (TYLENOL ALLERGY SINUS PO) Take 1 tablet by mouth daily as needed (allergy). (Patient not taking: Reported on 06/17/2020)    . chlorpheniramine-HYDROcodone (TUSSIONEX) 10-8 MG/5ML SUER Take 5 mLs by mouth every 6 (six) hours as needed for cough (If unresponsive to Robitussin DM). (Patient not taking: Reported on 06/17/2020) 140 mL 0  . feeding supplement (ENSURE ENLIVE / ENSURE PLUS) LIQD Take 237 mLs by mouth 3 (three) times daily between meals. (Patient not taking: Reported on 06/17/2020) 237 mL 12  . guaiFENesin (MUCINEX) 600 MG 12 hr tablet Take 1 tablet (600 mg total) by mouth 2 (two) times daily. (Patient not taking: Reported on 06/17/2020) 60 tablet 0  . LORazepam (ATIVAN) 0.5 MG tablet Take 1 tablet (0.5 mg total) by mouth every 8 (eight) hours as needed for anxiety. Take 1 hour prior to radiation therapy (Patient not taking: Reported on 06/17/2020) 30 tablet 0  .  ondansetron (ZOFRAN) 4 MG tablet Take 1 tablet (4 mg total) by mouth every 8 (eight) hours as needed for nausea or vomiting. (Patient not taking: Reported on 06/17/2020) 40 tablet 1  . oxymetazoline (AFRIN) 0.05 % nasal spray Place 1 spray into both nostrils daily as needed for congestion. (Patient not taking: Reported on 06/17/2020)    . senna (SENOKOT) 8.6 MG TABS tablet Take 8.6 mg by mouth daily as needed for mild constipation. (Patient not taking: Reported on 06/17/2020)     No current facility-administered medications for this visit.    PHYSICAL EXAMINATION:  ECOG PERFORMANCE STATUS: 0 - Asymptomatic  Vitals:   06/17/20 1206  BP: (!) 121/49  Pulse: 84  Resp: 18  Temp: 97.9 F (36.6 C)  SpO2: 100%   Filed Weights   06/17/20 1206  Weight: 106 lb 14.4 oz (48.5 kg)   GENERAL: alert, resp distress. Needing oxygen, 2 L. SKIN: pallor noted. Lymphatics:  No palpable cervical lymphadenopathy, axillary lymphadenopathy has definitely  improved. BREAST:  Left breast mass has significantly improved compared to the initial exam.  She currently now has some erythema over the left breast with a smaller mass, I did not measure the mass today but visibly definitely improved since initial visit. The mass occupied pretty much the entire breast. Respiratory: Air entry noted both bases, no significant effusion on exam today. Cardiovascular:  Regular rate and rhythm, S1/S2, without murmur, rub or gallop.    2+ pedal edema mostly noted in the ankles and the lower part of the legs.  Thighs appear to be spared MSK: 2+ BLE,  left upper extremity swelling from axillary lymphadenopathy, slightly worse since hospital discharge.  The left upper extremity edema got better after radiation. Skin exam was without echymosis, petichae.   Neuro exam was nonfocal. Patient was alert and oriented.  Attention was good.   Language was appropriate.  Mood was normal without depression.  Speech was not pressured.  Thought content was not tangential.    LABORATORY DATA:  I have reviewed the data as listed Lab Results  Component Value Date   WBC 5.8 06/17/2020   HGB 9.8 (L) 06/17/2020   HCT 30.4 (L) 06/17/2020   MCV 89.9 06/17/2020   PLT 344 06/17/2020     Chemistry      Component Value Date/Time   NA 142 06/17/2020 1131   K 3.6 06/17/2020 1131   CL 102 06/17/2020 1131   CO2 30 06/17/2020 1131   BUN 14 06/17/2020 1131   CREATININE 0.68 06/17/2020 1131   CREATININE 0.71 03/24/2020 0824      Component Value Date/Time   CALCIUM 8.5 (L) 06/17/2020 1131   ALKPHOS 286 (H) 05/28/2020 0341   AST 27 05/28/2020 0341   AST 184 (HH) 03/24/2020 0824   ALT 21 05/28/2020 0341   ALT 28 03/24/2020 0824   BILITOT 0.2 (L) 05/28/2020 0341   BILITOT 1.2 03/24/2020 0824       RADIOGRAPHIC STUDIES: I have personally reviewed the radiological images as listed and agreed with the findings in the report. DG Chest 2 View  Result Date: 06/06/2020 CLINICAL DATA:   History of breast carcinoma with effusions and respiratory difficulty EXAM: CHEST - 2 VIEW COMPARISON:  06/02/2020 FINDINGS: PleurX catheter is now seen on the left in satisfactory position. Left pleural effusion has resolved. Moderate right-sided pleural effusion is noted increased from the prior exam. Patchy interstitial changes are again noted. Old rib fractures are seen with healing bilaterally. IMPRESSION: Interval reduction  in left-sided pleural effusion following PleurX catheter placement. No pneumothorax is noted. Increase in right-sided pleural effusion when compared with the prior exam. Electronically Signed   By: Inez Catalina M.D.   On: 06/06/2020 15:59   DG Chest 2 View  Result Date: 06/02/2020 CLINICAL DATA:  Pleural effusions.  Breast carcinoma EXAM: CHEST - 2 VIEW COMPARISON:  May 30, 2020 FINDINGS: Pleural effusions, larger on the left than on the right, essentially stable compared to prior study. Airspace opacity in portions of the inferior lingula and left lower lobe are again noted as is atelectatic change in the right base. Heart size and pulmonary vascularity are within normal limits. No adenopathy appreciable. There is aortic atherosclerosis. Multiple sclerotic bony metastases again noted. IMPRESSION: Persistent pleural effusions, larger on the left than on the right, stable. Lower lung region atelectatic change with suspected superimposed consolidation left lower lobe and potentially in a portion of the inferior lingula. Stable cardiac silhouette. Sclerotic bony metastases consistent with known breast carcinoma. Aortic Atherosclerosis (ICD10-I70.0). Electronically Signed   By: Lowella Grip III M.D.   On: 06/02/2020 11:04   DG Chest 2 View  Result Date: 05/30/2020 CLINICAL DATA:  Dyspnea EXAM: CHEST - 2 VIEW COMPARISON:  05/29/2020 FINDINGS: Increased moderate left pleural effusion with adjacent atelectasis. Increased mild right pleural effusion with adjacent atelectasis. There  may be superimposed consolidation. Similar cardiomediastinal contours. Sclerotic osseous metastatic disease. IMPRESSION: Increased left greater than right pleural effusions with adjacent atelectasis/consolidation. Electronically Signed   By: Macy Mis M.D.   On: 05/30/2020 11:40   DG Chest 2 View  Result Date: 05/29/2020 CLINICAL DATA:  Shortness of breath EXAM: CHEST - 2 VIEW COMPARISON:  May 27, 2020 FINDINGS: Pleural effusions bilaterally again noted. There is interstitial pulmonary edema with areas of upper lobe atelectatic change bilaterally. There is consolidation in portions of each lung base. Heart is mildly enlarged with a degree of pulmonary venous hypertension. No adenopathy. There is aortic atherosclerosis. Multiple sclerotic bone lesions identified consistent with metastatic disease from known breast carcinoma. IMPRESSION: 1. Pleural effusions bilaterally with a degree of interstitial edema. There is cardiomegaly with pulmonary vascular congestion. Appearance felt to be indicative of a degree of underlying congestive heart failure. 2. Bibasilar atelectasis. A degree of bibasilar consolidation is questioned. 3.  Sclerotic bony metastases again noted. 4.  Aortic Atherosclerosis (ICD10-I70.0). Electronically Signed   By: Lowella Grip III M.D.   On: 05/29/2020 11:43   CT CHEST ABDOMEN PELVIS W CONTRAST  Result Date: 05/27/2020 CLINICAL DATA:  Breast cancer with bone metastasis. Short of breath. Leg swelling. EXAM: CT CHEST, ABDOMEN, AND PELVIS WITH CONTRAST TECHNIQUE: Multidetector CT imaging of the chest, abdomen and pelvis was performed following the standard protocol during bolus administration of intravenous contrast. CONTRAST:  161m OMNIPAQUE IOHEXOL 300 MG/ML  SOLN COMPARISON:  CT 02/27/2020 FINDINGS: CT CHEST FINDINGS Cardiovascular: No significant vascular findings. Normal heart size. No pericardial effusion. Mediastinum/Nodes: Enlarged LEFT axillary lymph node measures 16 mm  compares to 20 mm. Several peripheral lymph nodes/nodules in the LEFT axilla seen on comparison exam are no longer evident. Asymmetric thickened tissue in the LEFT breast measuring 16 mm compares to 19 mm. Lungs/Pleura: Nodule in the RIGHT upper lobe measuring 8 mm (image 54/series 4) compares to 7 mm. Smaller nodule in the RIGHT upper lobe measuring 4 mm on image 60 compares to 2 mm. There is interval increase in bilateral layering pleural effusions. Interval increase in bibasilar passive atelectasis. RIGHT lower lobe atelectasis greater  than RIGHT. Musculoskeletal: Diffuse osseous metastasis again noted. The metastasis is diffusely sclerotic where previously lesions were mixed lytic and sclerotic. Several expansile lesions are noted. Example RIGHT lateral second rib measuring 20 mm compares to 25 mm. CT ABDOMEN AND PELVIS FINDINGS Hepatobiliary: Previously round enhancing lesions within the liver are now represented by hypoenhancing lesions. Overall volume of the liver is decrease measuring 19.9 cm in axial dimension (image 47/CT series 2) compared to 22 cm similar level on comparison exam. Pancreas: Pancreas is normal. No ductal dilatation. No pancreatic inflammation. Spleen: Normal spleen Adrenals/urinary tract: Thickening of the LEFT adrenal gland to 14 mm compares to 13 mm. RIGHT adrenal gland appears normal. Kidneys ureters and bladder normal. Stomach/Bowel: No bowel obstruction.  No bowel lesion identified Vascular/Lymphatic: Abdominal aorta is normal caliber with atherosclerotic calcification. There is no retroperitoneal or periportal lymphadenopathy. No pelvic lymphadenopathy. Reproductive: Uterus and adnexa unremarkable. Other: Interval increase in intraperitoneal free fluid. Musculoskeletal: As with the bones in the thorax, the previously mixed lytic and sclerotic metastatic disease has been near complete place by diffuse sclerotic pattern. IMPRESSION: 1. Overall there appears to be a positive response  to therapy with decrease in size and number of LEFT axillary lymph nodes and LEFT breast mass. Additionally the volume of the liver is decreased suggesting a positive response to diffuse hepatic metastasis. 2. Previously seen diffuse lytic and sclerotic skeletal metastasis have been replaced by uniform bone sclerosis also suggesting a positive response. Several of the expansile lesions are reduced in volume. 3. Similar RIGHT lung pulmonary nodules. 4. Interval increase in large volume pleural effusions with interval increase in bibasilar passive atelectasis. This coincides with patient's shortness of breath. These results will be called to the ordering clinician or representative by the Radiologist Assistant, and communication documented in the PACS or [employer]. Electronically Signed   By: Suzy Bouchard M.D.   On: 05/27/2020 09:33   DG Chest Port 1 View  Result Date: 05/29/2020 CLINICAL DATA:  80 year old female with bilateral pleural effusions status post right-sided thoracentesis. EXAM: PORTABLE CHEST 1 VIEW COMPARISON:  Earlier radiograph dated 05/29/2020 FINDINGS: Interval decrease in the size of the right pleural effusion. There is a small to moderate left pleural effusion and associated left lung base atelectasis. Pneumonia is not excluded. No pneumothorax. Stable cardiomediastinal silhouette. Osteopenia with degenerative changes of the spine. Sclerotic bone metastasis as seen on the earlier radiograph. Old right rib fractures. No acute osseous pathology. IMPRESSION: 1. Interval decrease in the size of the right pleural effusion. No pneumothorax. 2. Small to moderate left pleural effusion and associated left lung base atelectasis. Electronically Signed   By: Anner Crete M.D.   On: 05/29/2020 16:28   DG Chest Port 1 View  Result Date: 05/27/2020 CLINICAL DATA:  Shortness of breath and cough, left thoracentesis earlier today EXAM: PORTABLE CHEST 1 VIEW COMPARISON:  Same day chest  radiograph at 1720 hours. FINDINGS: The heart size and mediastinal contours are partially obscured but appear unchanged. Decreased versus redistributed moderate right pleural effusion with stable small left pleural effusion. Slightly improved expansion of the left lung otherwise persistent bilateral interstitial and airspace opacities. The visualized skeletal structures are unchanged. IMPRESSION: 1. Decreased versus redistributed moderate right pleural effusion with stable small left pleural effusion. 2. Slightly improved expansion of the left lung otherwise persistent bilateral interstitial and airspace opacities. Electronically Signed   By: Dahlia Bailiff MD   On: 05/27/2020 20:01   DG Chest Port 1 View  Result Date: 05/27/2020  CLINICAL DATA:  Shortness of breath, cough. EXAM: PORTABLE CHEST 1 VIEW COMPARISON:  February 29, 2020. FINDINGS: Stable cardiomediastinal silhouette. Significantly increased bibasilar opacities are noted concerning for mild pleural effusions and associated bibasilar edema or atelectasis. Bony thorax is unremarkable. No pneumothorax is noted. IMPRESSION: Increased bibasilar opacities are noted concerning for worsening pleural effusions and bibasilar edema or atelectasis. Electronically Signed   By: Marijo Conception M.D.   On: 05/27/2020 17:53   ECHOCARDIOGRAM COMPLETE  Result Date: 05/28/2020    ECHOCARDIOGRAM REPORT   Patient Name:   KINYA MEINE Date of Exam: 05/28/2020 Medical Rec #:  [id number]      Height:       61.0 in Accession #:    [id number]     Weight:       106.7 lb Date of Birth:  1940/08/29       BSA:          1.446 m Patient Age:    77 years       BP:           116/54 mmHg Patient Gender: F              HR:           95 bpm. Exam Location:  Inpatient Procedure: 2D Echo Indications:    CHF-Acute Diastolic R44.31  History:        Patient has prior history of Echocardiogram examinations, most                 recent 02/28/2020.  Sonographer:    Mikki Santee RDCS (AE)  Referring Phys: [place]  1. Left ventricular ejection fraction, by estimation, is 60 to 65%. The left ventricle has normal function. The left ventricle has no regional wall motion abnormalities. Left ventricular diastolic parameters are indeterminate.  2. Right ventricular systolic function is normal. The right ventricular size is normal. Tricuspid regurgitation signal is inadequate for assessing PA pressure.  3. A small pericardial effusion is present.  4. The mitral valve is normal in structure. No evidence of mitral valve regurgitation.  5. The aortic valve was not well visualized. Aortic valve regurgitation is not visualized. No aortic stenosis is present.  6. The inferior vena cava is normal in size with greater than 50% respiratory variability, suggesting right atrial pressure of 3 mmHg. FINDINGS  Left Ventricle: Left ventricular ejection fraction, by estimation, is 60 to 65%. The left ventricle has normal function. The left ventricle has no regional wall motion abnormalities. The left ventricular internal cavity size was normal in size. There is  no left ventricular hypertrophy. Left ventricular diastolic parameters are indeterminate. Right Ventricle: The right ventricular size is normal. No increase in right ventricular wall thickness. Right ventricular systolic function is normal. Tricuspid regurgitation signal is inadequate for assessing PA pressure. Left Atrium: Left atrial size was normal in size. Right Atrium: Right atrial size was normal in size. Pericardium: A small pericardial effusion is present. Mitral Valve: The mitral valve is normal in structure. No evidence of mitral valve regurgitation. Tricuspid Valve: The tricuspid valve is normal in structure. Tricuspid valve regurgitation is not demonstrated. Aortic Valve: The aortic valve was not well visualized. Aortic valve regurgitation is not visualized. No aortic stenosis is present. Pulmonic Valve: The pulmonic valve was  not well visualized. Pulmonic valve regurgitation is not visualized. Aorta: The aortic root is normal in size and structure. Venous: The inferior vena cava is normal in size with  greater than 50% respiratory variability, suggesting right atrial pressure of 3 mmHg. IAS/Shunts: The interatrial septum was not well visualized.  LEFT VENTRICLE PLAX 2D LVIDd:         3.70 cm  Diastology LVIDs:         2.50 cm  LV e' medial:    7.27 cm/s LV PW:         1.00 cm  LV E/e' medial:  14.3 LV IVS:        1.00 cm  LV e' lateral:   6.42 cm/s LVOT diam:     1.90 cm  LV E/e' lateral: 16.2 LV SV:         66 LV SV Index:   45 LVOT Area:     2.84 cm  RIGHT VENTRICLE RV S prime:     14.20 cm/s TAPSE (M-mode): 1.8 cm LEFT ATRIUM             Index       RIGHT ATRIUM          Index LA diam:        3.10 cm 2.14 cm/m  RA Area:     9.61 cm LA Vol (A2C):   48.4 ml 33.47 ml/m RA Volume:   17.90 ml 12.38 ml/m LA Vol (A4C):   38.8 ml 26.83 ml/m LA Biplane Vol: 46.0 ml 31.81 ml/m  AORTIC VALVE LVOT Vmax:   104.00 cm/s LVOT Vmean:  77.000 cm/s LVOT VTI:    0.232 m  AORTA Ao Root diam: 2.60 cm MITRAL VALVE MV Area (PHT): 3.27 cm     SHUNTS MV Decel Time: 232 msec     Systemic VTI:  0.23 m MV E velocity: 104.00 cm/s  Systemic Diam: 1.90 cm MV A velocity: 116.00 cm/s MV E/A ratio:  0.90 Oswaldo Milian MD Electronically signed by Oswaldo Milian MD Signature Date/Time: 05/28/2020/1:05:06 PM    Final    IR PERC PLEURAL DRAIN W/INDWELL CATH W/IMG GUIDE  Result Date: 06/03/2020 CLINICAL DATA:  History of metastatic breast cancer, now with recurrent symptomatic left-sided pleural effusion. Please perform image guided placement of a tunneled pleural drainage catheter for palliative purposes. EXAM: INSERTION OF TUNNELED LEFT SIDED PLEURAL DRAINAGE CATHETER COMPARISON:  Ultrasound-guided left-sided thoracentesis-05/27/2020; chest CT-05/27/2020; chest radiograph-06/02/2020 MEDICATIONS: Patient is currently admitted to the hospital receiving  intravenous antibiotics; Antibiotic was administered in an appropriate time interval for the procedure. ANESTHESIA/SEDATION: Moderate (conscious) sedation was employed during this procedure. A total of Versed 1 mg and Fentanyl 100 mcg was administered intravenously. Moderate Sedation Time: 17 minutes. The patient's level of consciousness and vital signs were monitored continuously by radiology nursing throughout the procedure under my direct supervision. FLUOROSCOPY TIME:  FLUOROSCOPY TIME 24 seconds (17 mGy) COMPLICATIONS: None immediate. PROCEDURE: The procedure, risks, benefits, and alternatives were explained to the patient, who wishes to proceed with the placement of this permanent pleural catheter as they are seeking palliative care. The patient understands and consents to the procedure. The inferolateral aspect of the left chest and upper abdomen were prepped and draped in a sterile fashion, and a sterile drape was applied covering the operative field. A sterile gown and sterile gloves were used for the procedure. Initial ultrasound scanning and fluoroscopic imaging demonstrates a recurrent moderate to large pleural effusion. Under direct ultrasound guidance, the inferior lateral pleural space was accessed with a Yueh sheath needle after the overlying soft tissues were anesthetized with 1% lidocaine with epinephrine. An Amplatz super stiff wire was then advanced  under fluoroscopy into the pleural space. A 15.5 French tunneled Pleur-X catheter was tunneled from an incision within the left upper abdominal quadrant to the access site. The pleural access site was serially dilated under fluoroscopy, ultimately allowing placement of a peel-away sheath. The catheter was advanced through the peel-away sheath. The sheath was then removed. Final catheter positioning was confirmed with a fluoroscopic radiographic image. The access incision was closed with an interrupted subcutaneous subcuticular 4-0 Vicryl, Dermabond  and Steri-Strips. A Prolene retention suture was applied at the catheter exit site. Large volume thoracentesis was performed through the new catheter utilizing provided bulb vacuum assisted drainage bag. Dressings were applied. The patient tolerated the above procedure well without immediate postprocedural complication. FINDINGS: Preprocedural ultrasound scanning demonstrates a recurrent large sized anechoic left sided pleural effusion. After ultrasound and fluoroscopic guided placement, the catheter is directed towards the left lung apex. Following catheter placement, approximately 1 L of serous pleural fluid was removed. IMPRESSION: Successful placement of permanent, tunneled left pleural drainage catheter via lateral approach. 1 L of serous pleural fluid was removed following catheter placement. Electronically Signed   By: Sandi Mariscal M.D.   On: 06/03/2020 15:28   US THORACENTESIS ASP PLEURAL SPACE W/IMG GUIDE  Result Date: 05/30/2020 INDICATION: Bilateral pleural effusion. Request for therapeutic and diagnostic thoracentesis. EXAM: ULTRASOUND GUIDED RIGHT THORACENTESIS MEDICATIONS: 10 mL 1% lidocaine COMPLICATIONS: None immediate. PROCEDURE: An ultrasound guided thoracentesis was thoroughly discussed with the patient and questions answered. The benefits, risks, alternatives and complications were also discussed. The patient understands and wishes to proceed with the procedure. Written consent was obtained. Ultrasound was performed to localize and mark an adequate pocket of fluid in the right chest. The area was then prepped and draped in the normal sterile fashion. 1% Lidocaine was used for local anesthesia. Under ultrasound guidance a 6 Fr Safe-T-Centesis catheter was introduced. Thoracentesis was performed. The catheter was removed and a dressing applied. FINDINGS: A total of approximately 900 cc of clear yellow fluid was removed. Samples were sent to the laboratory as requested by the clinical team. Post  procedure chest X-ray reviewed, negative for pneumothorax. IMPRESSION: Successful ultrasound guided right thoracentesis yielding 900 cc of pleural fluid. Read by: Durenda Guthrie, PA-C Electronically Signed   By: Markus Daft M.D.   On: 05/30/2020 07:47   US THORACENTESIS ASP PLEURAL SPACE W/IMG GUIDE  Result Date: 05/27/2020 INDICATION: Shortness of breath, left pleural effusion seen on previous CT scan. Request for therapeutic and diagnostic thoracentesis. EXAM: ULTRASOUND GUIDED LEFT THORACENTESIS MEDICATIONS: 1% lidocaine COMPLICATIONS: None immediate. PROCEDURE: An ultrasound guided thoracentesis was thoroughly discussed with the patient and questions answered. The benefits, risks, alternatives and complications were also discussed. The patient understands and wishes to proceed with the procedure. Written consent was obtained. Ultrasound was performed to localize and mark an adequate pocket of fluid in the left chest. The area was then prepped and draped in the normal sterile fashion. 1% Lidocaine was used for local anesthesia. Under ultrasound guidance a 6 Fr Safe-T-Centesis catheter was introduced. Thoracentesis was performed. The catheter was removed and a dressing applied. FINDINGS: A total of approximately 950 cc of clear yellow fluid was removed. Samples were sent to the laboratory as requested by the clinical team. Post procedure chest x-ray ordered. IMPRESSION: Successful ultrasound guided left thoracentesis yielding 950 cc of pleural fluid. Read by: Durenda Guthrie, PA-C Electronically Signed   By: Markus Daft M.D.   On: 05/27/2020 16:51   I was actively following her during  her hospitalization.  No new images since hospital discharge.  All questions were answered. The patient knows to call the clinic with any problems, questions or concerns.    [name], MD 06/17/2020 1:42 PM

## 2020-06-17 NOTE — Assessment & Plan Note (Signed)
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  She is tolerating Herceptin relatively well.  Her most recent echo from the hospital did not show any dramatic change in her ejection fraction, hence we have discussed about continuing Herceptin.  We will have to reload her Herceptin today since it has been almost 6 weeks from the last infusion. Labs reviewed, satisfactory to proceed with treatment today.  She was hoping to try treatment for few more times, if she does not respond or if there is any evidence of progression of disease, she would consider comfort measures.

## 2020-06-17 NOTE — Assessment & Plan Note (Signed)
Continue Lasix 40 mg oral daily. Elevation of legs recommended.

## 2020-06-17 NOTE — Assessment & Plan Note (Signed)
Etiology remains unclear.  Given the excellent response elsewhere from her ongoing treatment, we wondered if some of this pleural effusion could be related to her hypoalbuminemia versus malignant effusion.  Cytology negative.  She is currently status post Pleurx placement and is draining well.  She remains oxygen dependent.

## 2020-06-17 NOTE — Assessment & Plan Note (Signed)
No new bone pains.  Bone mets improved on her last imaging.  We will proceed with Zometa as planned today and then we will switch to Zometa every 3 months after this.

## 2020-06-17 NOTE — Patient Instructions (Signed)
[facility]  Discharge Instructions: Thank you for choosing [facility] to provide your oncology and hematology care.   If you have a lab appointment with the [facility], please go directly to the [facility] and check in at the registration area.   Wear comfortable clothing and clothing appropriate for easy access to any Portacath or PICC line.   We strive to give you quality time with your provider. You may need to reschedule your appointment if you arrive late (15 or more minutes).  Arriving late affects you and other patients whose appointments are after yours.  Also, if you miss three or more appointments without notifying the office, you may be dismissed from the clinic at the provider's discretion.      For prescription refill requests, have your pharmacy contact our office and allow 72 hours for refills to be completed.    Today you received the following chemotherapy and/or immunotherapy agent: Trastuzumab (Kanjinti)   To help prevent nausea and vomiting after your treatment, we encourage you to take your nausea medication as directed.  BELOW ARE SYMPTOMS THAT SHOULD BE REPORTED IMMEDIATELY: . *FEVER GREATER THAN 100.4 F (38 C) OR HIGHER . *CHILLS OR SWEATING . *NAUSEA AND VOMITING THAT IS NOT CONTROLLED WITH YOUR NAUSEA MEDICATION . *UNUSUAL SHORTNESS OF BREATH . *UNUSUAL BRUISING OR BLEEDING . *URINARY PROBLEMS (pain or burning when urinating, or frequent urination) . *BOWEL PROBLEMS (unusual diarrhea, constipation, pain near the anus) . TENDERNESS IN MOUTH AND THROAT WITH OR WITHOUT PRESENCE OF ULCERS (sore throat, sores in mouth, or a toothache) . UNUSUAL RASH, SWELLING OR PAIN  . UNUSUAL VAGINAL DISCHARGE OR ITCHING   Items with * indicate a potential emergency and should be followed up as soon as possible or go to the Emergency Department if any problems should occur.  Please show the CHEMOTHERAPY ALERT CARD or  IMMUNOTHERAPY ALERT CARD at check-in to the Emergency Department and triage nurse.  Should you have questions after your visit or need to cancel or reschedule your appointment, please contact [facility]  Dept: [phone]  and follow the prompts.  Office hours are 8:00 a.m. to 4:30 p.m. Monday - Friday. Please note that voicemails left after 4:00 p.m. may not be returned until the following business day.  We are closed weekends and major holidays. You have access to a nurse at all times for urgent questions. Please call the main number to the clinic Dept: [phone] and follow the prompts.   For any non-urgent questions, you may also contact your provider using MyChart. We now offer e-Visits for anyone 80 and older to request care online for non-urgent symptoms. For details visit [email or web address].   Also download the MyChart app! Go to the app store, search "MyChart", open the app, select [facility], and log in with your MyChart username and password.  Due to Covid, a mask is required upon entering the hospital/clinic. If you do not have a mask, one will be given to you upon arrival. For doctor visits, patients may have 1 support person aged 80 or older with them. For treatment visits, patients cannot have anyone with them due to current Covid guidelines and our immunocompromised population.   Zoledronic Acid Injection (Hypercalcemia, Oncology) What is this medicine? ZOLEDRONIC ACID (ZOE le dron ik AS id) slows calcium loss from bones. It high calcium levels in the blood from some kinds of cancer. It may be used in other people  at risk for bone loss. This medicine may be used for other purposes; ask your health care provider or pharmacist if you have questions. COMMON BRAND NAME(S): Zometa What should I tell my health care provider before I take this medicine? They need to know if you have any of these conditions:  cancer  dehydration  dental  disease  kidney disease  liver disease  low levels of calcium in the blood  lung or breathing disease (asthma)  receiving steroids like dexamethasone or prednisone  an unusual or allergic reaction to zoledronic acid, other medicines, foods, dyes, or preservatives  pregnant or trying to get pregnant  breast-feeding How should I use this medicine? This drug is injected into a vein. It is given by a health care provider in a hospital or clinic setting. Talk to your health care provider about the use of this drug in children. Special care may be needed. Overdosage: If you think you have taken too much of this medicine contact a poison control center or emergency room at once. NOTE: This medicine is only for you. Do not share this medicine with others. What if I miss a dose? Keep appointments for follow-up doses. It is important not to miss your dose. Call your health care provider if you are unable to keep an appointment. What may interact with this medicine?  certain antibiotics given by injection  NSAIDs, medicines for pain and inflammation, like ibuprofen or naproxen  some diuretics like bumetanide, furosemide  teriparatide  thalidomide This list may not describe all possible interactions. Give your health care provider a list of all the medicines, herbs, non-prescription drugs, or dietary supplements you use. Also tell them if you smoke, drink alcohol, or use illegal drugs. Some items may interact with your medicine. What should I watch for while using this medicine? Visit your health care provider for regular checks on your progress. It may be some time before you see the benefit from this drug. Some people who take this drug have severe bone, joint, or muscle pain. This drug may also increase your risk for jaw problems or a broken thigh bone. Tell your health care provider right away if you have severe pain in your jaw, bones, joints, or muscles. Tell you health care provider  if you have any pain that does not go away or that gets worse. Tell your dentist and dental surgeon that you are taking this drug. You should not have major dental surgery while on this drug. See your dentist to have a dental exam and fix any dental problems before starting this drug. Take good care of your teeth while on this drug. Make sure you see your dentist for regular follow-up appointments. You should make sure you get enough calcium and vitamin D while you are taking this drug. Discuss the foods you eat and the vitamins you take with your health care provider. Check with your health care provider if you have severe diarrhea, nausea, and vomiting, or if you sweat a lot. The loss of too much body fluid may make it dangerous for you to take this drug. You may need blood work done while you are taking this drug. Do not become pregnant while taking this drug. Women should inform their health care provider if they wish to become pregnant or think they might be pregnant. There is potential for serious harm to an unborn child. Talk to your health care provider for more information. What side effects may I notice from receiving this  medicine? Side effects that you should report to your doctor or health care provider as soon as possible:  allergic reactions (skin rash, itching or hives; swelling of the face, lips, or tongue)  bone pain  infection (fever, chills, cough, sore throat, pain or trouble passing urine)  jaw pain, especially after dental work  joint pain  kidney injury (trouble passing urine or change in the amount of urine)  low blood pressure (dizziness; feeling faint or lightheaded, falls; unusually weak or tired)  low calcium levels (fast heartbeat; muscle cramps or pain; pain, tingling, or numbness in the hands or feet; seizures)  low magnesium levels (fast, irregular heartbeat; muscle cramp or pain; muscle weakness; tremors; seizures)  low red blood cell counts (trouble  breathing; feeling faint; lightheaded, falls; unusually weak or tired)  muscle pain  redness, blistering, peeling, or loosening of the skin, including inside the mouth  severe diarrhea  swelling of the ankles, feet, hands  trouble breathing Side effects that usually do not require medical attention (report to your doctor or health care provider if they continue or are bothersome):  anxious  constipation  coughing  depressed mood  eye irritation, itching, or pain  fever  general ill feeling or flu-like symptoms  nausea  pain, redness, or irritation at site where injected  trouble sleeping This list may not describe all possible side effects. Call your doctor for medical advice about side effects. You may report side effects to FDA at 1-800-FDA-1088. Where should I keep my medicine? This drug is given in a hospital or clinic. It will not be stored at home. NOTE: This sheet is a summary. It may not cover all possible information. If you have questions about this medicine, talk to your doctor, pharmacist, or health care provider.  2021 Elsevier/Gold Standard (2018-11-02 09:13:00)

## 2020-06-18 ENCOUNTER — Ambulatory Visit: Payer: Medicare HMO | Admitting: Hematology and Oncology

## 2020-06-18 ENCOUNTER — Ambulatory Visit: Payer: Medicare HMO

## 2020-06-18 DIAGNOSIS — Z79899 Other long term (current) drug therapy: Secondary | ICD-10-CM | POA: Diagnosis not present

## 2020-06-18 DIAGNOSIS — C787 Secondary malignant neoplasm of liver and intrahepatic bile duct: Secondary | ICD-10-CM | POA: Diagnosis not present

## 2020-06-18 DIAGNOSIS — J918 Pleural effusion in other conditions classified elsewhere: Secondary | ICD-10-CM | POA: Diagnosis not present

## 2020-06-18 DIAGNOSIS — M6281 Muscle weakness (generalized): Secondary | ICD-10-CM | POA: Diagnosis not present

## 2020-06-18 DIAGNOSIS — I058 Other rheumatic mitral valve diseases: Secondary | ICD-10-CM | POA: Diagnosis not present

## 2020-06-18 DIAGNOSIS — M5431 Sciatica, right side: Secondary | ICD-10-CM | POA: Diagnosis not present

## 2020-06-18 DIAGNOSIS — I071 Rheumatic tricuspid insufficiency: Secondary | ICD-10-CM | POA: Diagnosis not present

## 2020-06-18 DIAGNOSIS — J910 Malignant pleural effusion: Secondary | ICD-10-CM | POA: Diagnosis not present

## 2020-06-18 DIAGNOSIS — C50919 Malignant neoplasm of unspecified site of unspecified female breast: Secondary | ICD-10-CM | POA: Diagnosis not present

## 2020-06-18 DIAGNOSIS — Z9181 History of falling: Secondary | ICD-10-CM | POA: Diagnosis not present

## 2020-06-18 DIAGNOSIS — C50912 Malignant neoplasm of unspecified site of left female breast: Secondary | ICD-10-CM | POA: Diagnosis not present

## 2020-06-18 DIAGNOSIS — J42 Unspecified chronic bronchitis: Secondary | ICD-10-CM | POA: Diagnosis not present

## 2020-06-19 ENCOUNTER — Ambulatory Visit: Payer: Medicare HMO

## 2020-06-20 ENCOUNTER — Ambulatory Visit: Payer: Medicare HMO

## 2020-06-23 ENCOUNTER — Ambulatory Visit: Payer: Medicare HMO

## 2020-06-23 DIAGNOSIS — I071 Rheumatic tricuspid insufficiency: Secondary | ICD-10-CM | POA: Diagnosis not present

## 2020-06-23 DIAGNOSIS — Z9181 History of falling: Secondary | ICD-10-CM | POA: Diagnosis not present

## 2020-06-23 DIAGNOSIS — J42 Unspecified chronic bronchitis: Secondary | ICD-10-CM | POA: Diagnosis not present

## 2020-06-23 DIAGNOSIS — Z79899 Other long term (current) drug therapy: Secondary | ICD-10-CM | POA: Diagnosis not present

## 2020-06-23 DIAGNOSIS — C787 Secondary malignant neoplasm of liver and intrahepatic bile duct: Secondary | ICD-10-CM | POA: Diagnosis not present

## 2020-06-23 DIAGNOSIS — C50912 Malignant neoplasm of unspecified site of left female breast: Secondary | ICD-10-CM | POA: Diagnosis not present

## 2020-06-23 DIAGNOSIS — I058 Other rheumatic mitral valve diseases: Secondary | ICD-10-CM | POA: Diagnosis not present

## 2020-06-23 DIAGNOSIS — M6281 Muscle weakness (generalized): Secondary | ICD-10-CM | POA: Diagnosis not present

## 2020-06-23 DIAGNOSIS — M5431 Sciatica, right side: Secondary | ICD-10-CM | POA: Diagnosis not present

## 2020-06-23 DIAGNOSIS — J910 Malignant pleural effusion: Secondary | ICD-10-CM | POA: Diagnosis not present

## 2020-06-24 ENCOUNTER — Other Ambulatory Visit: Payer: Self-pay

## 2020-06-24 ENCOUNTER — Other Ambulatory Visit: Payer: Medicare HMO | Admitting: Student

## 2020-06-24 ENCOUNTER — Ambulatory Visit: Payer: Medicare HMO

## 2020-06-24 DIAGNOSIS — I071 Rheumatic tricuspid insufficiency: Secondary | ICD-10-CM | POA: Diagnosis not present

## 2020-06-24 DIAGNOSIS — R609 Edema, unspecified: Secondary | ICD-10-CM

## 2020-06-24 DIAGNOSIS — L299 Pruritus, unspecified: Secondary | ICD-10-CM

## 2020-06-24 DIAGNOSIS — C50919 Malignant neoplasm of unspecified site of unspecified female breast: Secondary | ICD-10-CM

## 2020-06-24 DIAGNOSIS — C787 Secondary malignant neoplasm of liver and intrahepatic bile duct: Secondary | ICD-10-CM | POA: Diagnosis not present

## 2020-06-24 DIAGNOSIS — C7951 Secondary malignant neoplasm of bone: Secondary | ICD-10-CM | POA: Diagnosis not present

## 2020-06-24 DIAGNOSIS — I890 Lymphedema, not elsewhere classified: Secondary | ICD-10-CM | POA: Diagnosis not present

## 2020-06-24 DIAGNOSIS — Z79899 Other long term (current) drug therapy: Secondary | ICD-10-CM | POA: Diagnosis not present

## 2020-06-24 DIAGNOSIS — J309 Allergic rhinitis, unspecified: Secondary | ICD-10-CM

## 2020-06-24 DIAGNOSIS — Z515 Encounter for palliative care: Secondary | ICD-10-CM | POA: Diagnosis not present

## 2020-06-24 DIAGNOSIS — C50912 Malignant neoplasm of unspecified site of left female breast: Secondary | ICD-10-CM | POA: Diagnosis not present

## 2020-06-24 DIAGNOSIS — J910 Malignant pleural effusion: Secondary | ICD-10-CM | POA: Diagnosis not present

## 2020-06-24 DIAGNOSIS — M5431 Sciatica, right side: Secondary | ICD-10-CM | POA: Diagnosis not present

## 2020-06-24 DIAGNOSIS — Z9181 History of falling: Secondary | ICD-10-CM | POA: Diagnosis not present

## 2020-06-24 DIAGNOSIS — M6281 Muscle weakness (generalized): Secondary | ICD-10-CM | POA: Diagnosis not present

## 2020-06-24 DIAGNOSIS — J42 Unspecified chronic bronchitis: Secondary | ICD-10-CM | POA: Diagnosis not present

## 2020-06-24 DIAGNOSIS — I058 Other rheumatic mitral valve diseases: Secondary | ICD-10-CM | POA: Diagnosis not present

## 2020-06-24 NOTE — Progress Notes (Signed)
[facility] Consult Note Telephone: [phone]  Fax: [phone]    Date of encounter: 06/24/20 PATIENT NAME: Morgan Yu [address] [place] [address]   [phone] (home)  DOB: 1940-07-12 MRN: [id number] PRIMARY CARE PROVIDER:    Patient, [name],  No address on file None  REFERRING PROVIDER:   Dr. Chryl Heck  RESPONSIBLE PARTY:    Contact Information    Name Relation Home Work Mobile   Davis,Gayle Relative   [phone]       I met face to face with patient in the home. Palliative Care was asked to follow this patient by consultation request of  Dr. Chryl Heck to address advance care planning and complex medical decision making. This is a follow up visit.                                   ASSESSMENT AND PLAN / RECOMMENDATIONS:   Advance Care Planning/Goals of Care: Goals include to maximize quality of life and symptom management. Our advance care planning conversation included a discussion about:     The value and importance of advance care planning   Experiences with loved ones who have been seriously ill or have died   Exploration of personal, cultural or spiritual beliefs that might influence medical decisions   Exploration of goals of care in the event of a sudden injury or illness   Identification and preparation of a healthcare agent   Review and updating or creation of an  advance directive document   CODE STATUS: DNR  Discussed Palliative Medicine vs. Hospice. She expresses wanting to maintain quality of life. She wants to continue receiving treatment for now. She understands that if she stops treatment, she will be eligible for Hospice services. Palliative Medicine will continue to provide support.   Symptom Management/Plan:  Metastatic breast cancer-patient currently receiving Arimidex daily and Herceptin every 21 days, Zometa every six weeks; continue as directed. Follow up  with Oncology as scheduled. Continue PT as directed with [facility].  Edema-patient with bilateral LE edema, left upper extremity edema. She is encouraged to elevate her feet and legs while resting. She is also encouraged to elevate her left arm on pillow. Continue furosemide 44m daily. Recommend OT for Left upper extremity edema.  Allergic rhinitis-symptoms consistent with allergic rhinitis. Recommend cetrizine 141mdaily, saline nasal spray 1-2 sprays in each nostril BID.   Pruritis-itching to left upper extremity. Recommend Sarna lotion apply BID.   Follow up Palliative Care Visit: Palliative care will continue to follow for complex medical decision making, advance care planning, and clarification of goals. Return in 4 weeks or prn.  I spent 60 minutes providing this consultation. More than 50% of the time in this consultation was spent in counseling and care coordination.   PPS: 50%  HOSPICE ELIGIBILITY/DIAGNOSIS: TBD  Chief Complaint: Palliative Medicine follow up visit.  HISTORY OF PRESENT ILLNESS:  Morgan BROWNSTEINs a 89.o. year old female  with carcinoma of breast with metastasis to bone. She is currently receiving Herceptin, Arimidex and Zometa. Patient recently hospitalized 4/26-06/06/20 due to acute hypoxemic respiratory failure, pleural effusion, carcinoma of breast metastatic to bone, BLE edema, acute on chronic diastolic HF, hypoalbuminemia. Patient had decided to go home with hospice upon discharge. She has since changed her mind and has resumed treatment for her cancer.   Patient  currently receiving HHPT, SN services with [facility]. She is s/p pleurx drain placement;  has pleurx drained twice weekly. She reports improvement in shortness of breath. She is currently wearing oxygen via nasal canula at 3.5 lpm. Denies having pain. She continues to have left upper extremity edema and BLE edema. Taking furosemide 39m daily. Has a caregiver coming at night. Endorses good  family/church support. Patient reports itching to left upper extremity. She also endorses post nasal drip, sneezing, ear congestion; denies sore throat, pain, ear pain.   History obtained from review of EMR, discussion with primary team, and interview with family, facility staff/caregiver and/or Morgan Yu.  I reviewed available labs, medications, imaging, studies and related documents from the EMR.  Records reviewed and summarized above.   ROS  General: NAD EYES: denies vision changes ENMT: denies dysphagia Cardiovascular: denies chest pain,  DOE Pulmonary: denies cough, increased SOB Abdomen: endorses good appetite, denies constipation, endorses continence of bowel GU: denies dysuria MSK: weakness,  Fall x 1 reported Skin: denies rashes or wounds Neurological: denies pain, denies insomnia Psych: Endorses positive mood Heme/lymph/immuno: denies bruises, abnormal bleeding  Physical Exam:  Constitutional: NAD General: frail appearing, thin EYES: anicteric sclera, lids intact, no discharge  ENMT: intact hearing, oral mucous membranes moist, dentition intact CV: S1S2, RRR, 2+ pitting LE edema Pulmonary: crackles to right base, otherwise clear, no increased work of breathing, no cough, oxygen at 3.5lpm via n/c Abdomen: normo-active BS + 4 quadrants, soft and non tender GU: deferred MSK: moves all extremities, ambulatory Skin: warm and dry, no rashes or wounds on visible skin, lymphedema to left upper extremity Neuro: generalized weakness, A & O x 4 Psych: non-anxious affect Hem/lymph/immuno: no widespread bruising   Thank you for the opportunity to participate in the care of Morgan Yu.  The palliative care team will continue to follow. Please call our office at [phone] we can be of additional assistance.   LEzekiel Slocumb NP   COVID-19 PATIENT SCREENING TOOL Asked and negative response unless otherwise noted:   Have you had symptoms of covid, tested positive or been  in contact with someone with symptoms/positive test in the past 5-10 days? No

## 2020-06-25 ENCOUNTER — Ambulatory Visit: Payer: Medicare HMO

## 2020-06-25 ENCOUNTER — Telehealth: Payer: Self-pay | Admitting: Radiology

## 2020-06-25 NOTE — Telephone Encounter (Signed)
Patient asks if she should restart radiation treatment or proceed with physical therapy. Per Dr Sondra Come, advised patient to proceed with physical therapy and call back if it doesn't relieve her symptoms. Questions answered. Patient verbalized understanding.

## 2020-06-26 DIAGNOSIS — C787 Secondary malignant neoplasm of liver and intrahepatic bile duct: Secondary | ICD-10-CM | POA: Diagnosis not present

## 2020-06-26 DIAGNOSIS — I071 Rheumatic tricuspid insufficiency: Secondary | ICD-10-CM | POA: Diagnosis not present

## 2020-06-26 DIAGNOSIS — I058 Other rheumatic mitral valve diseases: Secondary | ICD-10-CM | POA: Diagnosis not present

## 2020-06-26 DIAGNOSIS — Z79899 Other long term (current) drug therapy: Secondary | ICD-10-CM | POA: Diagnosis not present

## 2020-06-26 DIAGNOSIS — M6281 Muscle weakness (generalized): Secondary | ICD-10-CM | POA: Diagnosis not present

## 2020-06-26 DIAGNOSIS — J42 Unspecified chronic bronchitis: Secondary | ICD-10-CM | POA: Diagnosis not present

## 2020-06-26 DIAGNOSIS — Z9181 History of falling: Secondary | ICD-10-CM | POA: Diagnosis not present

## 2020-06-26 DIAGNOSIS — M5431 Sciatica, right side: Secondary | ICD-10-CM | POA: Diagnosis not present

## 2020-06-26 DIAGNOSIS — C50912 Malignant neoplasm of unspecified site of left female breast: Secondary | ICD-10-CM | POA: Diagnosis not present

## 2020-06-26 DIAGNOSIS — J910 Malignant pleural effusion: Secondary | ICD-10-CM | POA: Diagnosis not present

## 2020-06-27 ENCOUNTER — Telehealth: Payer: Self-pay | Admitting: Hematology and Oncology

## 2020-06-27 ENCOUNTER — Telehealth: Payer: Self-pay

## 2020-06-27 DIAGNOSIS — T8189XA Other complications of procedures, not elsewhere classified, initial encounter: Secondary | ICD-10-CM | POA: Diagnosis not present

## 2020-06-27 DIAGNOSIS — S81002A Unspecified open wound, left knee, initial encounter: Secondary | ICD-10-CM | POA: Diagnosis not present

## 2020-06-27 NOTE — Telephone Encounter (Signed)
Scheduled per 05/26 scheduled message, patient is scheduled to see provider on 06/01. Patient is notified.

## 2020-06-27 NOTE — Telephone Encounter (Signed)
Phone call returned to Dor[facility] Section in response to received message re: pleurx drain. VM left with call back information

## 2020-07-01 ENCOUNTER — Encounter: Payer: Self-pay | Admitting: Hematology and Oncology

## 2020-07-01 DIAGNOSIS — J42 Unspecified chronic bronchitis: Secondary | ICD-10-CM | POA: Diagnosis not present

## 2020-07-01 DIAGNOSIS — C50912 Malignant neoplasm of unspecified site of left female breast: Secondary | ICD-10-CM | POA: Diagnosis not present

## 2020-07-01 DIAGNOSIS — Z79899 Other long term (current) drug therapy: Secondary | ICD-10-CM | POA: Diagnosis not present

## 2020-07-01 DIAGNOSIS — C787 Secondary malignant neoplasm of liver and intrahepatic bile duct: Secondary | ICD-10-CM | POA: Diagnosis not present

## 2020-07-01 DIAGNOSIS — Z9181 History of falling: Secondary | ICD-10-CM | POA: Diagnosis not present

## 2020-07-01 DIAGNOSIS — J910 Malignant pleural effusion: Secondary | ICD-10-CM | POA: Diagnosis not present

## 2020-07-01 DIAGNOSIS — M6281 Muscle weakness (generalized): Secondary | ICD-10-CM | POA: Diagnosis not present

## 2020-07-01 DIAGNOSIS — I058 Other rheumatic mitral valve diseases: Secondary | ICD-10-CM | POA: Diagnosis not present

## 2020-07-01 DIAGNOSIS — M5431 Sciatica, right side: Secondary | ICD-10-CM | POA: Diagnosis not present

## 2020-07-01 DIAGNOSIS — I071 Rheumatic tricuspid insufficiency: Secondary | ICD-10-CM | POA: Diagnosis not present

## 2020-07-02 ENCOUNTER — Telehealth: Payer: Self-pay | Admitting: Hematology and Oncology

## 2020-07-02 ENCOUNTER — Encounter: Payer: Medicare HMO | Admitting: Physical Therapy

## 2020-07-02 DIAGNOSIS — I058 Other rheumatic mitral valve diseases: Secondary | ICD-10-CM | POA: Diagnosis not present

## 2020-07-02 DIAGNOSIS — J910 Malignant pleural effusion: Secondary | ICD-10-CM | POA: Diagnosis not present

## 2020-07-02 DIAGNOSIS — C787 Secondary malignant neoplasm of liver and intrahepatic bile duct: Secondary | ICD-10-CM | POA: Diagnosis not present

## 2020-07-02 DIAGNOSIS — I071 Rheumatic tricuspid insufficiency: Secondary | ICD-10-CM | POA: Diagnosis not present

## 2020-07-02 DIAGNOSIS — M5431 Sciatica, right side: Secondary | ICD-10-CM | POA: Diagnosis not present

## 2020-07-02 DIAGNOSIS — J42 Unspecified chronic bronchitis: Secondary | ICD-10-CM | POA: Diagnosis not present

## 2020-07-02 DIAGNOSIS — C50912 Malignant neoplasm of unspecified site of left female breast: Secondary | ICD-10-CM | POA: Diagnosis not present

## 2020-07-02 DIAGNOSIS — Z79899 Other long term (current) drug therapy: Secondary | ICD-10-CM | POA: Diagnosis not present

## 2020-07-02 DIAGNOSIS — Z9181 History of falling: Secondary | ICD-10-CM | POA: Diagnosis not present

## 2020-07-02 DIAGNOSIS — M6281 Muscle weakness (generalized): Secondary | ICD-10-CM | POA: Diagnosis not present

## 2020-07-02 NOTE — Telephone Encounter (Signed)
Cancelled appt per 6/1 sch msg. Called pt, no answer. Left msg letting pt know I cancelled appt.

## 2020-07-03 ENCOUNTER — Ambulatory Visit: Payer: Medicare HMO | Admitting: Hematology and Oncology

## 2020-07-03 DIAGNOSIS — Z79899 Other long term (current) drug therapy: Secondary | ICD-10-CM | POA: Diagnosis not present

## 2020-07-03 DIAGNOSIS — C787 Secondary malignant neoplasm of liver and intrahepatic bile duct: Secondary | ICD-10-CM | POA: Diagnosis not present

## 2020-07-03 DIAGNOSIS — I058 Other rheumatic mitral valve diseases: Secondary | ICD-10-CM | POA: Diagnosis not present

## 2020-07-03 DIAGNOSIS — C50912 Malignant neoplasm of unspecified site of left female breast: Secondary | ICD-10-CM | POA: Diagnosis not present

## 2020-07-03 DIAGNOSIS — Z9181 History of falling: Secondary | ICD-10-CM | POA: Diagnosis not present

## 2020-07-03 DIAGNOSIS — J42 Unspecified chronic bronchitis: Secondary | ICD-10-CM | POA: Diagnosis not present

## 2020-07-03 DIAGNOSIS — M6281 Muscle weakness (generalized): Secondary | ICD-10-CM | POA: Diagnosis not present

## 2020-07-03 DIAGNOSIS — J910 Malignant pleural effusion: Secondary | ICD-10-CM | POA: Diagnosis not present

## 2020-07-03 DIAGNOSIS — I071 Rheumatic tricuspid insufficiency: Secondary | ICD-10-CM | POA: Diagnosis not present

## 2020-07-03 DIAGNOSIS — M5431 Sciatica, right side: Secondary | ICD-10-CM | POA: Diagnosis not present

## 2020-07-06 DIAGNOSIS — C799 Secondary malignant neoplasm of unspecified site: Secondary | ICD-10-CM | POA: Diagnosis not present

## 2020-07-06 DIAGNOSIS — J90 Pleural effusion, not elsewhere classified: Secondary | ICD-10-CM | POA: Diagnosis not present

## 2020-07-06 DIAGNOSIS — J9601 Acute respiratory failure with hypoxia: Secondary | ICD-10-CM | POA: Diagnosis not present

## 2020-07-07 ENCOUNTER — Other Ambulatory Visit: Payer: Self-pay

## 2020-07-07 ENCOUNTER — Inpatient Hospital Stay: Payer: Medicare HMO | Admitting: Hematology and Oncology

## 2020-07-07 ENCOUNTER — Encounter: Payer: Self-pay | Admitting: Hematology and Oncology

## 2020-07-07 ENCOUNTER — Inpatient Hospital Stay: Payer: Medicare HMO | Attending: Hematology and Oncology

## 2020-07-07 ENCOUNTER — Inpatient Hospital Stay: Payer: Medicare HMO

## 2020-07-07 DIAGNOSIS — Z170 Estrogen receptor positive status [ER+]: Secondary | ICD-10-CM | POA: Diagnosis not present

## 2020-07-07 DIAGNOSIS — R600 Localized edema: Secondary | ICD-10-CM

## 2020-07-07 DIAGNOSIS — C50919 Malignant neoplasm of unspecified site of unspecified female breast: Secondary | ICD-10-CM

## 2020-07-07 DIAGNOSIS — C7951 Secondary malignant neoplasm of bone: Secondary | ICD-10-CM | POA: Diagnosis not present

## 2020-07-07 DIAGNOSIS — I890 Lymphedema, not elsewhere classified: Secondary | ICD-10-CM | POA: Diagnosis not present

## 2020-07-07 DIAGNOSIS — S81002A Unspecified open wound, left knee, initial encounter: Secondary | ICD-10-CM | POA: Diagnosis not present

## 2020-07-07 DIAGNOSIS — F411 Generalized anxiety disorder: Secondary | ICD-10-CM

## 2020-07-07 DIAGNOSIS — Z79899 Other long term (current) drug therapy: Secondary | ICD-10-CM | POA: Diagnosis not present

## 2020-07-07 DIAGNOSIS — Z5112 Encounter for antineoplastic immunotherapy: Secondary | ICD-10-CM | POA: Diagnosis not present

## 2020-07-07 DIAGNOSIS — T8189XA Other complications of procedures, not elsewhere classified, initial encounter: Secondary | ICD-10-CM | POA: Diagnosis not present

## 2020-07-07 LAB — CBC WITH DIFFERENTIAL/PLATELET
Abs Immature Granulocytes: 0.02 10*3/uL (ref 0.00–0.07)
Basophils Absolute: 0 10*3/uL (ref 0.0–0.1)
Basophils Relative: 1 %
Eosinophils Absolute: 0.2 10*3/uL (ref 0.0–0.5)
Eosinophils Relative: 4 %
HCT: 32.1 % — ABNORMAL LOW (ref 36.0–46.0)
Hemoglobin: 10.1 g/dL — ABNORMAL LOW (ref 12.0–15.0)
Immature Granulocytes: 0 %
Lymphocytes Relative: 17 %
Lymphs Abs: 1.1 10*3/uL (ref 0.7–4.0)
MCH: 28 pg (ref 26.0–34.0)
MCHC: 31.5 g/dL (ref 30.0–36.0)
MCV: 88.9 fL (ref 80.0–100.0)
Monocytes Absolute: 1 10*3/uL (ref 0.1–1.0)
Monocytes Relative: 15 %
Neutro Abs: 4.2 10*3/uL (ref 1.7–7.7)
Neutrophils Relative %: 63 %
Platelets: 313 10*3/uL (ref 150–400)
RBC: 3.61 MIL/uL — ABNORMAL LOW (ref 3.87–5.11)
RDW: 15.4 % (ref 11.5–15.5)
WBC: 6.6 10*3/uL (ref 4.0–10.5)
nRBC: 0 % (ref 0.0–0.2)

## 2020-07-07 LAB — BASIC METABOLIC PANEL
Anion gap: 9 (ref 5–15)
BUN: 15 mg/dL (ref 8–23)
CO2: 27 mmol/L (ref 22–32)
Calcium: 8.6 mg/dL — ABNORMAL LOW (ref 8.9–10.3)
Chloride: 104 mmol/L (ref 98–111)
Creatinine, Ser: 0.66 mg/dL (ref 0.44–1.00)
GFR, Estimated: >60 mL/min (ref >60)
Glucose, Bld: 203 mg/dL — ABNORMAL HIGH (ref 70–99)
Potassium: 3.5 mmol/L (ref 3.5–5.1)
Sodium: 140 mmol/L (ref 135–145)

## 2020-07-07 MED ORDER — SODIUM CHLORIDE 0.9 % IV SOLN
300.0000 mg | Freq: Once | INTRAVENOUS | Status: AC
  Administered 2020-07-07: 300 mg via INTRAVENOUS
  Filled 2020-07-07: qty 14.29

## 2020-07-07 MED ORDER — SODIUM CHLORIDE 0.9 % IV SOLN
Freq: Once | INTRAVENOUS | Status: AC
Start: 2020-07-07 — End: 2020-07-07
  Filled 2020-07-07: qty 250

## 2020-07-07 MED ORDER — ANASTROZOLE 1 MG PO TABS: 1.0000 mg | ORAL_TABLET | Freq: Every day | ORAL | 11 refills | Status: DC

## 2020-07-07 MED ORDER — FUROSEMIDE 40 MG PO TABS: 40.0000 mg | ORAL_TABLET | Freq: Every day | ORAL | 0 refills | Status: DC

## 2020-07-07 NOTE — Assessment & Plan Note (Signed)
This was thought to be secondary to hypoalbuminemia She continues to use lasix 40 mg po daily She is working to improve her oral intake. Stable compared to last visit Lasix filled, encouraged potassium rich food supplementation while on lasix.

## 2020-07-07 NOTE — Progress Notes (Signed)
[place] FOLLOW UP NOTE  Patient Care Team: Patient, [name] as PCP - General (General Practice) [name], MD as Consulting Physician (Hematology and Oncology)  CHIEF COMPLAINTS/PURPOSE OF CONSULTATION:   FU on metastatic breast cancer, recently hospitalized, planned infusion and visit today after discharge.  ASSESSMENT & PLAN:   Carcinoma of breast metastatic to bone [facility]) This is a very pleasant 80 year old female patient with ER positive, PR negative and HER2 amplified breast cancer currently on Arimidex and Herceptin given her age and PS here for FU Since last visit, clinically improving. No major concerns today except for underlying LUE swelling, BLE swelling. Ok to proceed with herceptin as planned. Repeat imaging planned in July to assess response.  Bilateral lower extremity edema This was thought to be secondary to hypoalbuminemia She continues to use lasix 40 mg po daily She is working to improve her oral intake. Stable compared to last visit Lasix filled, encouraged potassium rich food supplementation while on lasix.  Lymphedema Apparently there is some issue with insurance coverage of Lymphedema treatment along with home health nurse approval. She will continue PT for now Encouraged to call Dr Clabe Seal office and schedule FU if she will consider radiation again   Bone metastases ([place]) Will continue zometa every 3 months.  No orders of the defined types were placed in this encounter. Message sent to scheduling for FU and infusion appointments.  HISTORY OF PRESENTING ILLNESS:   Morgan Yu 80 y.o. female is here because of newly diagnosed metastatic breast cancer.  Oncology History  Carcinoma of breast metastatic to bone ([place])  03/19/2020 Initial Diagnosis   Carcinoma of breast metastatic to bone ([place])   03/24/2020 -  Chemotherapy    Patient is on Treatment Plan: BREAST TRASTUZUMAB Q21D      04/15/2020 Cancer Staging    Staging form: Breast, AJCC 8th Edition - Clinical stage from 04/15/2020: Stage IV (cTX, cNX, cM1, GX, ER+, PR-, HER2+) - Signed by [name], MD on 04/15/2020 Stage prefix: Initial diagnosis Histologic grading system: 3 grade system    Interim History  Ms. Margean is here for follow-up. Since last visit, she is doing really well. Leg swelling has improved, erythema has improved, taking lasix 40 mg po daily She is using her pleurex catheter as needed, last used Thursday. She is eating well, doing PT, she is not sure if she wants to do more radiation. She is taking arimidex as prescribed. No new bone pains. Bowels are moving regularly Urinating well Using oxygen only at night. Rest of the pertinent 10 point ROS reviewed and negative.  MEDICAL HISTORY:  Past Medical History:  Diagnosis Date  . Breast cancer ([place])    with bone metastasis    SURGICAL HISTORY: Past Surgical History:  Procedure Laterality Date  . IR PERC PLEURAL DRAIN W/INDWELL CATH W/IMG GUIDE  06/03/2020    SOCIAL HISTORY: Social History   Socioeconomic History  . Marital status: Widowed    Spouse name: Not on file  . Number of children: Not on file  . Years of education: Not on file  . Highest education level: Not on file  Occupational History  . Not on file  Tobacco Use  . Smoking status: Never Smoker  . Smokeless tobacco: Never Used  Vaping Use  . Vaping Use: Never used  Substance and Sexual Activity  . Alcohol use: Not Currently  . Drug use: Never  . Sexual activity: Not on file  Other Topics Concern  .  Not on file  Social History Narrative   No biological children   Has 2 stepchildren   Social Determinants of Health   Financial Resource Strain: Not on file  Food Insecurity: Not on file  Transportation Needs: Not on file  Physical Activity: Not on file  Stress: Not on file  Social Connections: Not on file  Intimate Partner Violence: Not on file    FAMILY HISTORY: Family History   Problem Relation Age of Onset  . Stroke Mother   . Heart attack Father   . Cancer Brother   . Esophageal cancer Maternal Aunt   . Breast cancer Maternal Aunt     ALLERGIES:  is allergic to vicodin hp [hydrocodone-acetaminophen].  MEDICATIONS:  Current Outpatient Medications  Medication Sig Dispense Refill  . anastrozole (ARIMIDEX) 1 MG tablet TAKE 1 TABLET BY MOUTH EVERY DAY (Patient taking differently: Take 1 mg by mouth daily.) 30 tablet 1  . benzonatate (TESSALON) 100 MG capsule Take 1 capsule (100 mg total) by mouth 3 (three) times daily. 20 capsule 0  . Chlorphen-Pseudoephed-APAP (TYLENOL ALLERGY SINUS PO) Take 1 tablet by mouth daily as needed (allergy).    . chlorpheniramine-HYDROcodone (TUSSIONEX) 10-8 MG/5ML SUER Take 5 mLs by mouth every 6 (six) hours as needed for cough (If unresponsive to Robitussin DM). 140 mL 0  . feeding supplement (ENSURE ENLIVE / ENSURE PLUS) LIQD Take 237 mLs by mouth 3 (three) times daily between meals. 237 mL 12  . furosemide (LASIX) 40 MG tablet Take 1 tablet (40 mg total) by mouth daily. 30 tablet 0  . guaiFENesin (MUCINEX) 600 MG 12 hr tablet Take 1 tablet (600 mg total) by mouth 2 (two) times daily. 60 tablet 0  . LORazepam (ATIVAN) 0.5 MG tablet Take 1 tablet (0.5 mg total) by mouth every 8 (eight) hours as needed for anxiety. Take 1 hour prior to radiation therapy 30 tablet 0  . ondansetron (ZOFRAN) 4 MG tablet Take 1 tablet (4 mg total) by mouth every 8 (eight) hours as needed for nausea or vomiting. 40 tablet 1  . oxyCODONE (OXY IR/ROXICODONE) 5 MG immediate release tablet Take 0.5-1 tablets (2.5-5 mg total) by mouth every 4 (four) hours as needed for severe pain (dyspnea). 15 tablet 0  . oxymetazoline (AFRIN) 0.05 % nasal spray Place 1 spray into both nostrils daily as needed for congestion.    . senna (SENOKOT) 8.6 MG TABS tablet Take 8.6 mg by mouth daily as needed for mild constipation.    Marland Kitchen acetaminophen (TYLENOL) 500 MG tablet Take 500 mg  by mouth every 6 (six) hours as needed for moderate pain.     No current facility-administered medications for this visit.    PHYSICAL EXAMINATION:  ECOG PERFORMANCE STATUS: 0 - Asymptomatic  Vitals:   07/07/20 0956  BP: 129/63  Pulse: 99  Resp: 19  Temp: 98 F (36.7 C)  SpO2: 99%   Filed Weights   07/07/20 0956  Weight: 104 lb 6.4 oz (47.4 kg)   Physical Exam Constitutional:      General: She is not in acute distress.    Appearance: She is not ill-appearing.  HENT:     Head: Normocephalic and atraumatic.  Cardiovascular:     Rate and Rhythm: Normal rate and regular rhythm.     Pulses: Normal pulses.     Heart sounds: Normal heart sounds.  Pulmonary:     Effort: Pulmonary effort is normal.     Breath sounds: Normal breath sounds.  Chest:  Breasts:     Left: Mass and skin change present.      Comments: Breast exam, left breast with skin changes from cancer involvement, stable since last visit  Abdominal:     General: Abdomen is flat.     Palpations: Abdomen is soft.  Musculoskeletal:        General: Normal range of motion.     Cervical back: Normal range of motion and neck supple.     Comments: LUE edema, more prominent in fore arm, left arm swelling has improved since radiation. Left axillary mass has also improved since radiation.  Lymphadenopathy:     Cervical: No cervical adenopathy.  Skin:    General: Skin is warm and dry.  Neurological:     General: No focal deficit present.     Mental Status: She is alert.  Psychiatric:        Mood and Affect: Mood normal.        Behavior: Behavior normal.      LABORATORY DATA:  I have reviewed the data as listed Lab Results  Component Value Date   WBC 6.6 07/07/2020   HGB 10.1 (L) 07/07/2020   HCT 32.1 (L) 07/07/2020   MCV 88.9 07/07/2020   PLT 313 07/07/2020     Chemistry      Component Value Date/Time   NA 140 07/07/2020 0922   K 3.5 07/07/2020 0922   CL 104 07/07/2020 0922   CO2 27 07/07/2020  0922   BUN 15 07/07/2020 0922   CREATININE 0.66 07/07/2020 0922   CREATININE 0.71 03/24/2020 0824      Component Value Date/Time   CALCIUM 8.6 (L) 07/07/2020 0922   ALKPHOS 286 (H) 05/28/2020 0341   AST 27 05/28/2020 0341   AST 184 (HH) 03/24/2020 0824   ALT 21 05/28/2020 0341   ALT 28 03/24/2020 0824   BILITOT 0.2 (L) 05/28/2020 0341   BILITOT 1.2 03/24/2020 0824       RADIOGRAPHIC STUDIES: I have personally reviewed the radiological images as listed and agreed with the findings in the report. No results found.  I spent 30 minutes in the care of the patient including H and P, review of records, counseling and coordination of care. We discussed about the clinical improvement, continue herceptin every 21 days, repeat imaging in July, consider continuation of radiation, PT for LUE lymphedema and follow up recommendations.  All questions were answered. The patient knows to call the clinic with any problems, questions or concerns.    [name], MD 07/07/2020 1:10 PM

## 2020-07-07 NOTE — Assessment & Plan Note (Signed)
Apparently there is some issue with insurance coverage of Lymphedema treatment along with home health nurse approval. She will continue PT for now Encouraged to call Dr Clabe Seal office and schedule FU if she will consider radiation again

## 2020-07-07 NOTE — Patient Instructions (Signed)
[facility] ONCOLOGY  Discharge Instructions: Thank you for choosing [facility] to provide your oncology and hematology care.   If you have a lab appointment with the [facility], please go directly to the [facility] and check in at the registration area.   Wear comfortable clothing and clothing appropriate for easy access to any Portacath or PICC line.   We strive to give you quality time with your provider. You may need to reschedule your appointment if you arrive late (15 or more minutes).  Arriving late affects you and other patients whose appointments are after yours.  Also, if you miss three or more appointments without notifying the office, you may be dismissed from the clinic at the provider's discretion.      For prescription refill requests, have your pharmacy contact our office and allow 72 hours for refills to be completed.    Today you received the following chemotherapy and/or immunotherapy agents: Kanjinti.      To help prevent nausea and vomiting after your treatment, we encourage you to take your nausea medication as directed.  BELOW ARE SYMPTOMS THAT SHOULD BE REPORTED IMMEDIATELY: . *FEVER GREATER THAN 100.4 F (38 C) OR HIGHER . *CHILLS OR SWEATING . *NAUSEA AND VOMITING THAT IS NOT CONTROLLED WITH YOUR NAUSEA MEDICATION . *UNUSUAL SHORTNESS OF BREATH . *UNUSUAL BRUISING OR BLEEDING . *URINARY PROBLEMS (pain or burning when urinating, or frequent urination) . *BOWEL PROBLEMS (unusual diarrhea, constipation, pain near the anus) . TENDERNESS IN MOUTH AND THROAT WITH OR WITHOUT PRESENCE OF ULCERS (sore throat, sores in mouth, or a toothache) . UNUSUAL RASH, SWELLING OR PAIN  . UNUSUAL VAGINAL DISCHARGE OR ITCHING   Items with * indicate a potential emergency and should be followed up as soon as possible or go to the Emergency Department if any problems should occur.  Please show the CHEMOTHERAPY ALERT CARD or IMMUNOTHERAPY ALERT  CARD at check-in to the Emergency Department and triage nurse.  Should you have questions after your visit or need to cancel or reschedule your appointment, please contact [facility]  Dept: [phone]  and follow the prompts.  Office hours are 8:00 a.m. to 4:30 p.m. Monday - Friday. Please note that voicemails left after 4:00 p.m. may not be returned until the following business day.  We are closed weekends and major holidays. You have access to a nurse at all times for urgent questions. Please call the main number to the clinic Dept: [phone] and follow the prompts.   For any non-urgent questions, you may also contact your provider using MyChart. We now offer e-Visits for anyone 70 and older to request care online for non-urgent symptoms. For details visit [email or web address].   Also download the MyChart app! Go to the app store, search "MyChart", open the app, select [facility], and log in with your MyChart username and password.  Due to Covid, a mask is required upon entering the hospital/clinic. If you do not have a mask, one will be given to you upon arrival. For doctor visits, patients may have 1 support person aged 40 or older with them. For treatment visits, patients cannot have anyone with them due to current Covid guidelines and our immunocompromised population.

## 2020-07-07 NOTE — Assessment & Plan Note (Signed)
Will continue zometa every 3 months.

## 2020-07-07 NOTE — Addendum Note (Signed)
Addended by: Adaline Sill on: 07/07/2020 01:27 PM   Modules accepted: Orders

## 2020-07-07 NOTE — Assessment & Plan Note (Signed)
This is a very pleasant 80 year old female patient with ER positive, PR negative and HER2 amplified breast cancer currently on Arimidex and Herceptin given her age and PS here for FU Since last visit, clinically improving. No major concerns today except for underlying LUE swelling, BLE swelling. Ok to proceed with herceptin as planned. Repeat imaging planned in July to assess response.

## 2020-07-08 DIAGNOSIS — M5431 Sciatica, right side: Secondary | ICD-10-CM | POA: Diagnosis not present

## 2020-07-08 DIAGNOSIS — C787 Secondary malignant neoplasm of liver and intrahepatic bile duct: Secondary | ICD-10-CM | POA: Diagnosis not present

## 2020-07-08 DIAGNOSIS — Z4801 Encounter for change or removal of surgical wound dressing: Secondary | ICD-10-CM | POA: Diagnosis not present

## 2020-07-08 DIAGNOSIS — C50912 Malignant neoplasm of unspecified site of left female breast: Secondary | ICD-10-CM | POA: Diagnosis not present

## 2020-07-08 DIAGNOSIS — C7951 Secondary malignant neoplasm of bone: Secondary | ICD-10-CM | POA: Diagnosis not present

## 2020-07-08 DIAGNOSIS — J910 Malignant pleural effusion: Secondary | ICD-10-CM | POA: Diagnosis not present

## 2020-07-08 DIAGNOSIS — Z4803 Encounter for change or removal of drains: Secondary | ICD-10-CM | POA: Diagnosis not present

## 2020-07-08 DIAGNOSIS — R69 Illness, unspecified: Secondary | ICD-10-CM | POA: Diagnosis not present

## 2020-07-08 DIAGNOSIS — I058 Other rheumatic mitral valve diseases: Secondary | ICD-10-CM | POA: Diagnosis not present

## 2020-07-08 DIAGNOSIS — I509 Heart failure, unspecified: Secondary | ICD-10-CM | POA: Diagnosis not present

## 2020-07-10 DIAGNOSIS — M5431 Sciatica, right side: Secondary | ICD-10-CM | POA: Diagnosis not present

## 2020-07-10 DIAGNOSIS — Z4801 Encounter for change or removal of surgical wound dressing: Secondary | ICD-10-CM | POA: Diagnosis not present

## 2020-07-10 DIAGNOSIS — C787 Secondary malignant neoplasm of liver and intrahepatic bile duct: Secondary | ICD-10-CM | POA: Diagnosis not present

## 2020-07-10 DIAGNOSIS — C50912 Malignant neoplasm of unspecified site of left female breast: Secondary | ICD-10-CM | POA: Diagnosis not present

## 2020-07-10 DIAGNOSIS — I509 Heart failure, unspecified: Secondary | ICD-10-CM | POA: Diagnosis not present

## 2020-07-10 DIAGNOSIS — I058 Other rheumatic mitral valve diseases: Secondary | ICD-10-CM | POA: Diagnosis not present

## 2020-07-10 DIAGNOSIS — Z4803 Encounter for change or removal of drains: Secondary | ICD-10-CM | POA: Diagnosis not present

## 2020-07-10 DIAGNOSIS — C7951 Secondary malignant neoplasm of bone: Secondary | ICD-10-CM | POA: Diagnosis not present

## 2020-07-10 DIAGNOSIS — R69 Illness, unspecified: Secondary | ICD-10-CM | POA: Diagnosis not present

## 2020-07-10 DIAGNOSIS — J910 Malignant pleural effusion: Secondary | ICD-10-CM | POA: Diagnosis not present

## 2020-07-14 DIAGNOSIS — I058 Other rheumatic mitral valve diseases: Secondary | ICD-10-CM | POA: Diagnosis not present

## 2020-07-14 DIAGNOSIS — I509 Heart failure, unspecified: Secondary | ICD-10-CM | POA: Diagnosis not present

## 2020-07-14 DIAGNOSIS — J910 Malignant pleural effusion: Secondary | ICD-10-CM | POA: Diagnosis not present

## 2020-07-14 DIAGNOSIS — C50912 Malignant neoplasm of unspecified site of left female breast: Secondary | ICD-10-CM | POA: Diagnosis not present

## 2020-07-14 DIAGNOSIS — C787 Secondary malignant neoplasm of liver and intrahepatic bile duct: Secondary | ICD-10-CM | POA: Diagnosis not present

## 2020-07-14 DIAGNOSIS — R69 Illness, unspecified: Secondary | ICD-10-CM | POA: Diagnosis not present

## 2020-07-14 DIAGNOSIS — Z4803 Encounter for change or removal of drains: Secondary | ICD-10-CM | POA: Diagnosis not present

## 2020-07-14 DIAGNOSIS — Z4801 Encounter for change or removal of surgical wound dressing: Secondary | ICD-10-CM | POA: Diagnosis not present

## 2020-07-14 DIAGNOSIS — M5431 Sciatica, right side: Secondary | ICD-10-CM | POA: Diagnosis not present

## 2020-07-14 DIAGNOSIS — C7951 Secondary malignant neoplasm of bone: Secondary | ICD-10-CM | POA: Diagnosis not present

## 2020-07-15 DIAGNOSIS — M5431 Sciatica, right side: Secondary | ICD-10-CM | POA: Diagnosis not present

## 2020-07-15 DIAGNOSIS — I058 Other rheumatic mitral valve diseases: Secondary | ICD-10-CM | POA: Diagnosis not present

## 2020-07-15 DIAGNOSIS — R69 Illness, unspecified: Secondary | ICD-10-CM | POA: Diagnosis not present

## 2020-07-15 DIAGNOSIS — Z4801 Encounter for change or removal of surgical wound dressing: Secondary | ICD-10-CM | POA: Diagnosis not present

## 2020-07-15 DIAGNOSIS — Z4803 Encounter for change or removal of drains: Secondary | ICD-10-CM | POA: Diagnosis not present

## 2020-07-15 DIAGNOSIS — I509 Heart failure, unspecified: Secondary | ICD-10-CM | POA: Diagnosis not present

## 2020-07-15 DIAGNOSIS — C7951 Secondary malignant neoplasm of bone: Secondary | ICD-10-CM | POA: Diagnosis not present

## 2020-07-15 DIAGNOSIS — C50912 Malignant neoplasm of unspecified site of left female breast: Secondary | ICD-10-CM | POA: Diagnosis not present

## 2020-07-15 DIAGNOSIS — J910 Malignant pleural effusion: Secondary | ICD-10-CM | POA: Diagnosis not present

## 2020-07-15 DIAGNOSIS — C787 Secondary malignant neoplasm of liver and intrahepatic bile duct: Secondary | ICD-10-CM | POA: Diagnosis not present

## 2020-07-18 DIAGNOSIS — J910 Malignant pleural effusion: Secondary | ICD-10-CM | POA: Diagnosis not present

## 2020-07-18 DIAGNOSIS — I058 Other rheumatic mitral valve diseases: Secondary | ICD-10-CM | POA: Diagnosis not present

## 2020-07-18 DIAGNOSIS — I509 Heart failure, unspecified: Secondary | ICD-10-CM | POA: Diagnosis not present

## 2020-07-18 DIAGNOSIS — M5431 Sciatica, right side: Secondary | ICD-10-CM | POA: Diagnosis not present

## 2020-07-18 DIAGNOSIS — Z4803 Encounter for change or removal of drains: Secondary | ICD-10-CM | POA: Diagnosis not present

## 2020-07-18 DIAGNOSIS — C50912 Malignant neoplasm of unspecified site of left female breast: Secondary | ICD-10-CM | POA: Diagnosis not present

## 2020-07-18 DIAGNOSIS — Z4801 Encounter for change or removal of surgical wound dressing: Secondary | ICD-10-CM | POA: Diagnosis not present

## 2020-07-18 DIAGNOSIS — C787 Secondary malignant neoplasm of liver and intrahepatic bile duct: Secondary | ICD-10-CM | POA: Diagnosis not present

## 2020-07-18 DIAGNOSIS — C7951 Secondary malignant neoplasm of bone: Secondary | ICD-10-CM | POA: Diagnosis not present

## 2020-07-18 DIAGNOSIS — R69 Illness, unspecified: Secondary | ICD-10-CM | POA: Diagnosis not present

## 2020-07-22 DIAGNOSIS — C787 Secondary malignant neoplasm of liver and intrahepatic bile duct: Secondary | ICD-10-CM | POA: Diagnosis not present

## 2020-07-22 DIAGNOSIS — M5431 Sciatica, right side: Secondary | ICD-10-CM | POA: Diagnosis not present

## 2020-07-22 DIAGNOSIS — C50912 Malignant neoplasm of unspecified site of left female breast: Secondary | ICD-10-CM | POA: Diagnosis not present

## 2020-07-22 DIAGNOSIS — Z4803 Encounter for change or removal of drains: Secondary | ICD-10-CM | POA: Diagnosis not present

## 2020-07-22 DIAGNOSIS — I509 Heart failure, unspecified: Secondary | ICD-10-CM | POA: Diagnosis not present

## 2020-07-22 DIAGNOSIS — Z4801 Encounter for change or removal of surgical wound dressing: Secondary | ICD-10-CM | POA: Diagnosis not present

## 2020-07-22 DIAGNOSIS — R69 Illness, unspecified: Secondary | ICD-10-CM | POA: Diagnosis not present

## 2020-07-22 DIAGNOSIS — I058 Other rheumatic mitral valve diseases: Secondary | ICD-10-CM | POA: Diagnosis not present

## 2020-07-22 DIAGNOSIS — J910 Malignant pleural effusion: Secondary | ICD-10-CM | POA: Diagnosis not present

## 2020-07-22 DIAGNOSIS — C7951 Secondary malignant neoplasm of bone: Secondary | ICD-10-CM | POA: Diagnosis not present

## 2020-07-24 DIAGNOSIS — R69 Illness, unspecified: Secondary | ICD-10-CM | POA: Diagnosis not present

## 2020-07-24 DIAGNOSIS — Z4801 Encounter for change or removal of surgical wound dressing: Secondary | ICD-10-CM | POA: Diagnosis not present

## 2020-07-24 DIAGNOSIS — C7951 Secondary malignant neoplasm of bone: Secondary | ICD-10-CM | POA: Diagnosis not present

## 2020-07-24 DIAGNOSIS — C787 Secondary malignant neoplasm of liver and intrahepatic bile duct: Secondary | ICD-10-CM | POA: Diagnosis not present

## 2020-07-24 DIAGNOSIS — Z4803 Encounter for change or removal of drains: Secondary | ICD-10-CM | POA: Diagnosis not present

## 2020-07-24 DIAGNOSIS — M5431 Sciatica, right side: Secondary | ICD-10-CM | POA: Diagnosis not present

## 2020-07-24 DIAGNOSIS — I058 Other rheumatic mitral valve diseases: Secondary | ICD-10-CM | POA: Diagnosis not present

## 2020-07-24 DIAGNOSIS — I509 Heart failure, unspecified: Secondary | ICD-10-CM | POA: Diagnosis not present

## 2020-07-24 DIAGNOSIS — J910 Malignant pleural effusion: Secondary | ICD-10-CM | POA: Diagnosis not present

## 2020-07-24 DIAGNOSIS — C50912 Malignant neoplasm of unspecified site of left female breast: Secondary | ICD-10-CM | POA: Diagnosis not present

## 2020-07-25 ENCOUNTER — Other Ambulatory Visit: Payer: Self-pay

## 2020-07-25 DIAGNOSIS — C50919 Malignant neoplasm of unspecified site of unspecified female breast: Secondary | ICD-10-CM

## 2020-07-25 DIAGNOSIS — C7951 Secondary malignant neoplasm of bone: Secondary | ICD-10-CM

## 2020-07-28 ENCOUNTER — Inpatient Hospital Stay: Payer: Medicare HMO

## 2020-07-28 ENCOUNTER — Other Ambulatory Visit: Payer: Self-pay | Admitting: Hematology and Oncology

## 2020-07-28 ENCOUNTER — Other Ambulatory Visit: Payer: Self-pay

## 2020-07-28 ENCOUNTER — Encounter: Payer: Self-pay | Admitting: Hematology and Oncology

## 2020-07-28 VITALS — BP 150/65 | HR 96 | Temp 97.8°F | Resp 18 | Wt 103.8 lb

## 2020-07-28 DIAGNOSIS — R600 Localized edema: Secondary | ICD-10-CM | POA: Diagnosis not present

## 2020-07-28 DIAGNOSIS — C7951 Secondary malignant neoplasm of bone: Secondary | ICD-10-CM

## 2020-07-28 DIAGNOSIS — I890 Lymphedema, not elsewhere classified: Secondary | ICD-10-CM | POA: Diagnosis not present

## 2020-07-28 DIAGNOSIS — C50919 Malignant neoplasm of unspecified site of unspecified female breast: Secondary | ICD-10-CM | POA: Diagnosis not present

## 2020-07-28 DIAGNOSIS — Z5112 Encounter for antineoplastic immunotherapy: Secondary | ICD-10-CM | POA: Diagnosis not present

## 2020-07-28 DIAGNOSIS — F411 Generalized anxiety disorder: Secondary | ICD-10-CM

## 2020-07-28 DIAGNOSIS — Z79899 Other long term (current) drug therapy: Secondary | ICD-10-CM | POA: Diagnosis not present

## 2020-07-28 DIAGNOSIS — Z170 Estrogen receptor positive status [ER+]: Secondary | ICD-10-CM | POA: Diagnosis not present

## 2020-07-28 LAB — CBC WITH DIFFERENTIAL/PLATELET
Abs Immature Granulocytes: 0.01 10*3/uL (ref 0.00–0.07)
Basophils Absolute: 0.1 10*3/uL (ref 0.0–0.1)
Basophils Relative: 1 %
Eosinophils Absolute: 0.1 10*3/uL (ref 0.0–0.5)
Eosinophils Relative: 2 %
HCT: 34.5 % — ABNORMAL LOW (ref 36.0–46.0)
Hemoglobin: 10.9 g/dL — ABNORMAL LOW (ref 12.0–15.0)
Immature Granulocytes: 0 %
Lymphocytes Relative: 21 %
Lymphs Abs: 1.2 10*3/uL (ref 0.7–4.0)
MCH: 26.7 pg (ref 26.0–34.0)
MCHC: 31.6 g/dL (ref 30.0–36.0)
MCV: 84.6 fL (ref 80.0–100.0)
Monocytes Absolute: 0.9 10*3/uL (ref 0.1–1.0)
Monocytes Relative: 16 %
Neutro Abs: 3.5 10*3/uL (ref 1.7–7.7)
Neutrophils Relative %: 60 %
Platelets: 292 10*3/uL (ref 150–400)
RBC: 4.08 MIL/uL (ref 3.87–5.11)
RDW: 14.9 % (ref 11.5–15.5)
WBC: 5.8 10*3/uL (ref 4.0–10.5)
nRBC: 0 % (ref 0.0–0.2)

## 2020-07-28 LAB — COMPREHENSIVE METABOLIC PANEL
ALT: 22 U/L (ref 0–44)
AST: 35 U/L (ref 15–41)
Albumin: 2.9 g/dL — ABNORMAL LOW (ref 3.5–5.0)
Alkaline Phosphatase: 271 U/L — ABNORMAL HIGH (ref 38–126)
Anion gap: 12 (ref 5–15)
BUN: 13 mg/dL (ref 8–23)
CO2: 25 mmol/L (ref 22–32)
Calcium: 8.4 mg/dL — ABNORMAL LOW (ref 8.9–10.3)
Chloride: 104 mmol/L (ref 98–111)
Creatinine, Ser: 0.74 mg/dL (ref 0.44–1.00)
GFR, Estimated: >60 mL/min (ref >60)
Glucose, Bld: 201 mg/dL — ABNORMAL HIGH (ref 70–99)
Potassium: 3.8 mmol/L (ref 3.5–5.1)
Sodium: 141 mmol/L (ref 135–145)
Total Bilirubin: 0.3 mg/dL (ref 0.3–1.2)
Total Protein: 6.7 g/dL (ref 6.5–8.1)

## 2020-07-28 MED ORDER — SODIUM CHLORIDE 0.9 % IV SOLN
Freq: Once | INTRAVENOUS | Status: AC
Start: 2020-07-28 — End: 2020-07-28
  Filled 2020-07-28: qty 250

## 2020-07-28 MED ORDER — TRASTUZUMAB-ANNS CHEMO 150 MG IV SOLR
300.0000 mg | Freq: Once | INTRAVENOUS | Status: AC
  Administered 2020-07-28: 300 mg via INTRAVENOUS
  Filled 2020-07-28: qty 14.29

## 2020-07-28 NOTE — Progress Notes (Signed)
Patient states home health nurse requested MD eval of PleurX catheter insertion site d/t erythema. Order entered to IR eval and management. Patient aware and agrees to plan of care. Patient also states HH will be doing dressing change tomorrow (07/29/2020).

## 2020-07-28 NOTE — Patient Instructions (Addendum)
[facility] ONCOLOGY   Discharge Instructions: Thank you for choosing [facility] to provide your oncology and hematology care.   If you have a lab appointment with the [facility], please go directly to the [facility] and check in at the registration area.   Wear comfortable clothing and clothing appropriate for easy access to any Portacath or PICC line.   We strive to give you quality time with your provider. You may need to reschedule your appointment if you arrive late (15 or more minutes).  Arriving late affects you and other patients whose appointments are after yours.  Also, if you miss three or more appointments without notifying the office, you may be dismissed from the clinic at the provider's discretion.      For prescription refill requests, have your pharmacy contact our office and allow 72 hours for refills to be completed.    Today you received the following chemotherapy and/or immunotherapy agents Trastuzumab-anns     To help prevent nausea and vomiting after your treatment, we encourage you to take your nausea medication as directed.  BELOW ARE SYMPTOMS THAT SHOULD BE REPORTED IMMEDIATELY: *FEVER GREATER THAN 100.4 F (38 C) OR HIGHER *CHILLS OR SWEATING *NAUSEA AND VOMITING THAT IS NOT CONTROLLED WITH YOUR NAUSEA MEDICATION *UNUSUAL SHORTNESS OF BREATH *UNUSUAL BRUISING OR BLEEDING *URINARY PROBLEMS (pain or burning when urinating, or frequent urination) *BOWEL PROBLEMS (unusual diarrhea, constipation, pain near the anus) TENDERNESS IN MOUTH AND THROAT WITH OR WITHOUT PRESENCE OF ULCERS (sore throat, sores in mouth, or a toothache) UNUSUAL RASH, SWELLING OR PAIN  UNUSUAL VAGINAL DISCHARGE OR ITCHING   Items with * indicate a potential emergency and should be followed up as soon as possible or go to the Emergency Department if any problems should occur.  Please show the CHEMOTHERAPY ALERT CARD or IMMUNOTHERAPY ALERT CARD at  check-in to the Emergency Department and triage nurse.  Should you have questions after your visit or need to cancel or reschedule your appointment, please contact [facility]  Dept: [phone]  and follow the prompts.  Office hours are 8:00 a.m. to 4:30 p.m. Monday - Friday. Please note that voicemails left after 4:00 p.m. may not be returned until the following business day.  We are closed weekends and major holidays. You have access to a nurse at all times for urgent questions. Please call the main number to the clinic Dept: [phone] and follow the prompts.   For any non-urgent questions, you may also contact your provider using MyChart. We now offer e-Visits for anyone 63 and older to request care online for non-urgent symptoms. For details visit [email or web address].   Also download the MyChart app! Go to the app store, search "MyChart", open the app, select [facility], and log in with your MyChart username and password.  Due to Covid, a mask is required upon entering the hospital/clinic. If you do not have a mask, one will be given to you upon arrival. For doctor visits, patients may have 1 support person aged 55 or older with them. For treatment visits, patients cannot have anyone with them due to current Covid guidelines and our immunocompromised population.

## 2020-07-29 DIAGNOSIS — M5431 Sciatica, right side: Secondary | ICD-10-CM | POA: Diagnosis not present

## 2020-07-29 DIAGNOSIS — C7951 Secondary malignant neoplasm of bone: Secondary | ICD-10-CM | POA: Diagnosis not present

## 2020-07-29 DIAGNOSIS — C50912 Malignant neoplasm of unspecified site of left female breast: Secondary | ICD-10-CM | POA: Diagnosis not present

## 2020-07-29 DIAGNOSIS — I509 Heart failure, unspecified: Secondary | ICD-10-CM | POA: Diagnosis not present

## 2020-07-29 DIAGNOSIS — Z4801 Encounter for change or removal of surgical wound dressing: Secondary | ICD-10-CM | POA: Diagnosis not present

## 2020-07-29 DIAGNOSIS — Z4803 Encounter for change or removal of drains: Secondary | ICD-10-CM | POA: Diagnosis not present

## 2020-07-29 DIAGNOSIS — R69 Illness, unspecified: Secondary | ICD-10-CM | POA: Diagnosis not present

## 2020-07-29 DIAGNOSIS — J910 Malignant pleural effusion: Secondary | ICD-10-CM | POA: Diagnosis not present

## 2020-07-29 DIAGNOSIS — C787 Secondary malignant neoplasm of liver and intrahepatic bile duct: Secondary | ICD-10-CM | POA: Diagnosis not present

## 2020-07-29 DIAGNOSIS — I058 Other rheumatic mitral valve diseases: Secondary | ICD-10-CM | POA: Diagnosis not present

## 2020-07-31 ENCOUNTER — Other Ambulatory Visit: Payer: Self-pay

## 2020-07-31 ENCOUNTER — Ambulatory Visit (HOSPITAL_COMMUNITY)
Admission: RE | Admit: 2020-07-31 | Discharge: 2020-07-31 | Disposition: A | Discharge status: Home / Self Care | Payer: Medicare HMO | Source: Ambulatory Visit | Attending: Hematology | Admitting: Hematology

## 2020-07-31 ENCOUNTER — Other Ambulatory Visit: Payer: Self-pay | Admitting: Hematology

## 2020-07-31 DIAGNOSIS — C7951 Secondary malignant neoplasm of bone: Secondary | ICD-10-CM | POA: Diagnosis not present

## 2020-07-31 DIAGNOSIS — J90 Pleural effusion, not elsewhere classified: Secondary | ICD-10-CM | POA: Diagnosis not present

## 2020-07-31 DIAGNOSIS — Z4682 Encounter for fitting and adjustment of non-vascular catheter: Secondary | ICD-10-CM | POA: Diagnosis not present

## 2020-07-31 DIAGNOSIS — C50912 Malignant neoplasm of unspecified site of left female breast: Secondary | ICD-10-CM | POA: Insufficient documentation

## 2020-07-31 HISTORY — PX: IR RADIOLOGIST EVAL & MGMT: IMG5224

## 2020-07-31 NOTE — Progress Notes (Signed)
This RN was notified by Audie Pinto, RT that patient sustained a skin tear when she was getting on the IR table and "bumped" her leg on the side of the table. Skin taer with flap noted on lateral right lower leg. Hemostasis occurred prior to this RN's assessment. Skin tear cleansed with NS, gently patted dry with sterile gauze 4x4s, Vaseline gauze applied and covered with dry 4x4s and Tegaderm. Patient tolerated dressing procedure well.   Lesia Hausen, RN

## 2020-08-05 DIAGNOSIS — R69 Illness, unspecified: Secondary | ICD-10-CM | POA: Diagnosis not present

## 2020-08-05 DIAGNOSIS — Z4803 Encounter for change or removal of drains: Secondary | ICD-10-CM | POA: Diagnosis not present

## 2020-08-05 DIAGNOSIS — C50912 Malignant neoplasm of unspecified site of left female breast: Secondary | ICD-10-CM | POA: Diagnosis not present

## 2020-08-05 DIAGNOSIS — C799 Secondary malignant neoplasm of unspecified site: Secondary | ICD-10-CM | POA: Diagnosis not present

## 2020-08-05 DIAGNOSIS — C7951 Secondary malignant neoplasm of bone: Secondary | ICD-10-CM | POA: Diagnosis not present

## 2020-08-05 DIAGNOSIS — J90 Pleural effusion, not elsewhere classified: Secondary | ICD-10-CM | POA: Diagnosis not present

## 2020-08-05 DIAGNOSIS — J910 Malignant pleural effusion: Secondary | ICD-10-CM | POA: Diagnosis not present

## 2020-08-05 DIAGNOSIS — J9601 Acute respiratory failure with hypoxia: Secondary | ICD-10-CM | POA: Diagnosis not present

## 2020-08-05 DIAGNOSIS — M5431 Sciatica, right side: Secondary | ICD-10-CM | POA: Diagnosis not present

## 2020-08-05 DIAGNOSIS — C787 Secondary malignant neoplasm of liver and intrahepatic bile duct: Secondary | ICD-10-CM | POA: Diagnosis not present

## 2020-08-05 DIAGNOSIS — I058 Other rheumatic mitral valve diseases: Secondary | ICD-10-CM | POA: Diagnosis not present

## 2020-08-05 DIAGNOSIS — Z4801 Encounter for change or removal of surgical wound dressing: Secondary | ICD-10-CM | POA: Diagnosis not present

## 2020-08-05 DIAGNOSIS — I509 Heart failure, unspecified: Secondary | ICD-10-CM | POA: Diagnosis not present

## 2020-08-06 ENCOUNTER — Other Ambulatory Visit: Payer: Self-pay | Admitting: Hematology and Oncology

## 2020-08-06 DIAGNOSIS — C50912 Malignant neoplasm of unspecified site of left female breast: Secondary | ICD-10-CM | POA: Diagnosis not present

## 2020-08-06 DIAGNOSIS — C787 Secondary malignant neoplasm of liver and intrahepatic bile duct: Secondary | ICD-10-CM | POA: Diagnosis not present

## 2020-08-06 DIAGNOSIS — M5431 Sciatica, right side: Secondary | ICD-10-CM | POA: Diagnosis not present

## 2020-08-06 DIAGNOSIS — I509 Heart failure, unspecified: Secondary | ICD-10-CM | POA: Diagnosis not present

## 2020-08-06 DIAGNOSIS — R69 Illness, unspecified: Secondary | ICD-10-CM | POA: Diagnosis not present

## 2020-08-06 DIAGNOSIS — Z4803 Encounter for change or removal of drains: Secondary | ICD-10-CM | POA: Diagnosis not present

## 2020-08-06 DIAGNOSIS — J910 Malignant pleural effusion: Secondary | ICD-10-CM | POA: Diagnosis not present

## 2020-08-06 DIAGNOSIS — Z4801 Encounter for change or removal of surgical wound dressing: Secondary | ICD-10-CM | POA: Diagnosis not present

## 2020-08-06 DIAGNOSIS — C7951 Secondary malignant neoplasm of bone: Secondary | ICD-10-CM | POA: Diagnosis not present

## 2020-08-06 DIAGNOSIS — I058 Other rheumatic mitral valve diseases: Secondary | ICD-10-CM | POA: Diagnosis not present

## 2020-08-07 ENCOUNTER — Encounter: Payer: Self-pay | Admitting: *Deleted

## 2020-08-11 DIAGNOSIS — I509 Heart failure, unspecified: Secondary | ICD-10-CM | POA: Diagnosis not present

## 2020-08-11 DIAGNOSIS — R69 Illness, unspecified: Secondary | ICD-10-CM | POA: Diagnosis not present

## 2020-08-11 DIAGNOSIS — C787 Secondary malignant neoplasm of liver and intrahepatic bile duct: Secondary | ICD-10-CM | POA: Diagnosis not present

## 2020-08-11 DIAGNOSIS — I058 Other rheumatic mitral valve diseases: Secondary | ICD-10-CM | POA: Diagnosis not present

## 2020-08-11 DIAGNOSIS — C50912 Malignant neoplasm of unspecified site of left female breast: Secondary | ICD-10-CM | POA: Diagnosis not present

## 2020-08-11 DIAGNOSIS — Z4803 Encounter for change or removal of drains: Secondary | ICD-10-CM | POA: Diagnosis not present

## 2020-08-11 DIAGNOSIS — J910 Malignant pleural effusion: Secondary | ICD-10-CM | POA: Diagnosis not present

## 2020-08-11 DIAGNOSIS — M5431 Sciatica, right side: Secondary | ICD-10-CM | POA: Diagnosis not present

## 2020-08-11 DIAGNOSIS — C7951 Secondary malignant neoplasm of bone: Secondary | ICD-10-CM | POA: Diagnosis not present

## 2020-08-11 DIAGNOSIS — Z4801 Encounter for change or removal of surgical wound dressing: Secondary | ICD-10-CM | POA: Diagnosis not present

## 2020-08-12 DIAGNOSIS — Z4801 Encounter for change or removal of surgical wound dressing: Secondary | ICD-10-CM | POA: Diagnosis not present

## 2020-08-12 DIAGNOSIS — I509 Heart failure, unspecified: Secondary | ICD-10-CM | POA: Diagnosis not present

## 2020-08-12 DIAGNOSIS — C50912 Malignant neoplasm of unspecified site of left female breast: Secondary | ICD-10-CM | POA: Diagnosis not present

## 2020-08-12 DIAGNOSIS — I058 Other rheumatic mitral valve diseases: Secondary | ICD-10-CM | POA: Diagnosis not present

## 2020-08-12 DIAGNOSIS — J910 Malignant pleural effusion: Secondary | ICD-10-CM | POA: Diagnosis not present

## 2020-08-12 DIAGNOSIS — Z4803 Encounter for change or removal of drains: Secondary | ICD-10-CM | POA: Diagnosis not present

## 2020-08-12 DIAGNOSIS — M5431 Sciatica, right side: Secondary | ICD-10-CM | POA: Diagnosis not present

## 2020-08-12 DIAGNOSIS — C787 Secondary malignant neoplasm of liver and intrahepatic bile duct: Secondary | ICD-10-CM | POA: Diagnosis not present

## 2020-08-12 DIAGNOSIS — C7951 Secondary malignant neoplasm of bone: Secondary | ICD-10-CM | POA: Diagnosis not present

## 2020-08-12 DIAGNOSIS — R69 Illness, unspecified: Secondary | ICD-10-CM | POA: Diagnosis not present

## 2020-08-18 ENCOUNTER — Inpatient Hospital Stay (HOSPITAL_BASED_OUTPATIENT_CLINIC_OR_DEPARTMENT_OTHER): Payer: Medicare HMO | Admitting: Hematology and Oncology

## 2020-08-18 ENCOUNTER — Inpatient Hospital Stay: Payer: Medicare HMO | Attending: Hematology and Oncology

## 2020-08-18 ENCOUNTER — Inpatient Hospital Stay: Payer: Medicare HMO

## 2020-08-18 ENCOUNTER — Other Ambulatory Visit: Payer: Self-pay

## 2020-08-18 ENCOUNTER — Encounter: Payer: Self-pay | Admitting: Hematology and Oncology

## 2020-08-18 VITALS — BP 148/67 | HR 94 | Temp 98.6°F | Resp 18 | Ht 61.0 in | Wt 110.3 lb

## 2020-08-18 DIAGNOSIS — Z79899 Other long term (current) drug therapy: Secondary | ICD-10-CM | POA: Diagnosis not present

## 2020-08-18 DIAGNOSIS — Z4801 Encounter for change or removal of surgical wound dressing: Secondary | ICD-10-CM | POA: Diagnosis not present

## 2020-08-18 DIAGNOSIS — C7951 Secondary malignant neoplasm of bone: Secondary | ICD-10-CM

## 2020-08-18 DIAGNOSIS — F411 Generalized anxiety disorder: Secondary | ICD-10-CM

## 2020-08-18 DIAGNOSIS — Z803 Family history of malignant neoplasm of breast: Secondary | ICD-10-CM | POA: Diagnosis not present

## 2020-08-18 DIAGNOSIS — C787 Secondary malignant neoplasm of liver and intrahepatic bile duct: Secondary | ICD-10-CM | POA: Diagnosis not present

## 2020-08-18 DIAGNOSIS — C50919 Malignant neoplasm of unspecified site of unspecified female breast: Secondary | ICD-10-CM | POA: Diagnosis not present

## 2020-08-18 DIAGNOSIS — R600 Localized edema: Secondary | ICD-10-CM | POA: Insufficient documentation

## 2020-08-18 DIAGNOSIS — Z800 Family history of malignant neoplasm of digestive organs: Secondary | ICD-10-CM | POA: Insufficient documentation

## 2020-08-18 DIAGNOSIS — I509 Heart failure, unspecified: Secondary | ICD-10-CM | POA: Diagnosis not present

## 2020-08-18 DIAGNOSIS — Z170 Estrogen receptor positive status [ER+]: Secondary | ICD-10-CM | POA: Diagnosis not present

## 2020-08-18 DIAGNOSIS — I058 Other rheumatic mitral valve diseases: Secondary | ICD-10-CM | POA: Diagnosis not present

## 2020-08-18 DIAGNOSIS — J910 Malignant pleural effusion: Secondary | ICD-10-CM | POA: Diagnosis not present

## 2020-08-18 DIAGNOSIS — M7989 Other specified soft tissue disorders: Secondary | ICD-10-CM | POA: Insufficient documentation

## 2020-08-18 DIAGNOSIS — Z8673 Personal history of transient ischemic attack (TIA), and cerebral infarction without residual deficits: Secondary | ICD-10-CM | POA: Diagnosis not present

## 2020-08-18 DIAGNOSIS — I890 Lymphedema, not elsewhere classified: Secondary | ICD-10-CM | POA: Insufficient documentation

## 2020-08-18 DIAGNOSIS — R69 Illness, unspecified: Secondary | ICD-10-CM | POA: Diagnosis not present

## 2020-08-18 DIAGNOSIS — M5431 Sciatica, right side: Secondary | ICD-10-CM | POA: Diagnosis not present

## 2020-08-18 DIAGNOSIS — C50912 Malignant neoplasm of unspecified site of left female breast: Secondary | ICD-10-CM | POA: Insufficient documentation

## 2020-08-18 DIAGNOSIS — Z4803 Encounter for change or removal of drains: Secondary | ICD-10-CM | POA: Diagnosis not present

## 2020-08-18 LAB — CBC WITH DIFFERENTIAL/PLATELET
Abs Immature Granulocytes: 0.01 10*3/uL (ref 0.00–0.07)
Basophils Absolute: 0.1 10*3/uL (ref 0.0–0.1)
Basophils Relative: 1 %
Eosinophils Absolute: 0.2 10*3/uL (ref 0.0–0.5)
Eosinophils Relative: 3 %
HCT: 35 % — ABNORMAL LOW (ref 36.0–46.0)
Hemoglobin: 11.1 g/dL — ABNORMAL LOW (ref 12.0–15.0)
Immature Granulocytes: 0 %
Lymphocytes Relative: 26 %
Lymphs Abs: 1.8 10*3/uL (ref 0.7–4.0)
MCH: 26.1 pg (ref 26.0–34.0)
MCHC: 31.7 g/dL (ref 30.0–36.0)
MCV: 82.4 fL (ref 80.0–100.0)
Monocytes Absolute: 1.2 10*3/uL — ABNORMAL HIGH (ref 0.1–1.0)
Monocytes Relative: 18 %
Neutro Abs: 3.5 10*3/uL (ref 1.7–7.7)
Neutrophils Relative %: 52 %
Platelets: 276 10*3/uL (ref 150–400)
RBC: 4.25 MIL/uL (ref 3.87–5.11)
RDW: 15.2 % (ref 11.5–15.5)
WBC: 6.7 10*3/uL (ref 4.0–10.5)
nRBC: 0 % (ref 0.0–0.2)

## 2020-08-18 LAB — CMP (CANCER CENTER ONLY)
ALT: 13 U/L (ref 0–44)
AST: 27 U/L (ref 15–41)
Albumin: 3 g/dL — ABNORMAL LOW (ref 3.5–5.0)
Alkaline Phosphatase: 183 U/L — ABNORMAL HIGH (ref 38–126)
Anion gap: 9 (ref 5–15)
BUN: 12 mg/dL (ref 8–23)
CO2: 25 mmol/L (ref 22–32)
Calcium: 9 mg/dL (ref 8.9–10.3)
Chloride: 105 mmol/L (ref 98–111)
Creatinine: 0.72 mg/dL (ref 0.44–1.00)
GFR, Estimated: >60 mL/min (ref >60)
Glucose, Bld: 143 mg/dL — ABNORMAL HIGH (ref 70–99)
Potassium: 3.8 mmol/L (ref 3.5–5.1)
Sodium: 139 mmol/L (ref 135–145)
Total Bilirubin: 0.4 mg/dL (ref 0.3–1.2)
Total Protein: 6.7 g/dL (ref 6.5–8.1)

## 2020-08-18 MED ORDER — SODIUM CHLORIDE 0.9 % IV SOLN
Freq: Once | INTRAVENOUS | Status: AC
  Filled 2020-08-18: qty 250

## 2020-08-18 MED ORDER — TRASTUZUMAB-ANNS CHEMO 150 MG IV SOLR
300.0000 mg | Freq: Once | INTRAVENOUS | Status: AC
  Administered 2020-08-18: 300 mg via INTRAVENOUS
  Filled 2020-08-18: qty 14.29

## 2020-08-18 NOTE — Assessment & Plan Note (Signed)
LUE lymphedema stable.

## 2020-08-18 NOTE — Progress Notes (Signed)
[facility] [facility] FOLLOW UP NOTE  Patient Care Team: Patient, [name] as PCP - General (General Practice) [name], MD as Consulting Physician (Hematology and Oncology)  CHIEF COMPLAINTS/PURPOSE OF CONSULTATION:   FU on metastatic breast cancer, recently hospitalized, planned infusion and visit today after discharge.  ASSESSMENT & PLAN:   Carcinoma of breast metastatic to bone [facility]) This is a very pleasant 80 year old female patient with ER positive, PR negative and HER2 amplified breast cancer currently on Arimidex and Herceptin given her age and PS here for FU Since last visit, she looks remarkably better She is not using oxygen or the wheel chair, BLE significantly better, weight is better, Pleurex was drained 4 weeks ago, 300 cc of fluid. Overall significant clinical improvement. Will repeat imaging to check the status of metastatic carcinoma.  Malignant pleural effusion No significant drainage, IR team was wondering if this can be removed. Will repeat imaging and if radiological response to disease, removal of pleurex is reasonable.  Bilateral lower extremity edema Significantly better. Will continue to monitor.  Lymphedema LUE lymphedema stable.  Bone metastases ([place])  She is on zometa for bone health. Usually we dont recommend any invasive procedure manipulating roots while on zometa. This is what I conveyed to Ms Kealey today. She will be at high risk of ONS of the jaw with any major manipulation.   Orders Placed This Encounter  Procedures   CT CHEST ABDOMEN PELVIS W CONTRAST    Standing Status:   Future    Standing Expiration Date:   08/18/2021    Order Specific Question:   Preferred imaging location?    Answer:   [facility]    Order Specific Question:   Radiology Contrast Protocol - do NOT remove file path    Answer:   [email or web address]  Message sent to scheduling for FU and  infusion appointments.  HISTORY OF PRESENTING ILLNESS:   Morgan Yu 80 y.o. female is here because of newly diagnosed metastatic breast cancer.  Oncology History  Carcinoma of breast metastatic to bone ([place])  03/19/2020 Initial Diagnosis   Carcinoma of breast metastatic to bone ([place])    03/24/2020 -  Chemotherapy    Patient is on Treatment Plan: BREAST TRASTUZUMAB Q21D       04/15/2020 Cancer Staging   Staging form: Breast, AJCC 8th Edition - Clinical stage from 04/15/2020: Stage IV (cTX, cNX, cM1, GX, ER+, PR-, HER2+) - Signed by [name], MD on 04/15/2020  Stage prefix: Initial diagnosis  Histologic grading system: 3 grade system     Interim History  Ms. Deloria is here for follow-up. Since last visit, she is doing really well. She is not using oxygen, last pleurex drained about 4 weeks ago, drained 300 cc  She is cooking, eating well, gained about 6 lbs weight. No pain to report Left arm swelling improving according to patient Both legs are less swollen She feels so much better overall Had dental cleaning recently, wondering if she can have RCT. Rest of the pertinent 10 point ROS reviewed and neg.  MEDICAL HISTORY:  Past Medical History:  Diagnosis Date   Breast cancer [facility])    with bone metastasis    SURGICAL HISTORY: Past Surgical History:  Procedure Laterality Date   IR PERC PLEURAL DRAIN W/INDWELL CATH W/IMG GUIDE  06/03/2020   IR RADIOLOGIST EVAL & MGMT  07/31/2020    SOCIAL HISTORY: Social History   Socioeconomic History   Marital status: Widowed  Spouse name: Not on file   Number of children: Not on file   Years of education: Not on file   Highest education level: Not on file  Occupational History   Not on file  Tobacco Use   Smoking status: Never   Smokeless tobacco: Never  Vaping Use   Vaping Use: Never used  Substance and Sexual Activity   Alcohol use: Not Currently   Drug use: Never   Sexual activity: Not on file  Other  Topics Concern   Not on file  Social History Narrative   No biological children   Has 2 stepchildren   Social Determinants of Health   Financial Resource Strain: Not on file  Food Insecurity: Not on file  Transportation Needs: Not on file  Physical Activity: Not on file  Stress: Not on file  Social Connections: Not on file  Intimate Partner Violence: Not on file    FAMILY HISTORY: Family History  Problem Relation Age of Onset   Stroke Mother    Heart attack Father    Cancer Brother    Esophageal cancer Maternal Aunt    Breast cancer Maternal Aunt     ALLERGIES:  is allergic to vicodin hp [hydrocodone-acetaminophen].  MEDICATIONS:  Current Outpatient Medications  Medication Sig Dispense Refill   acetaminophen (TYLENOL) 500 MG tablet Take 500 mg by mouth every 6 (six) hours as needed for moderate pain.     anastrozole (ARIMIDEX) 1 MG tablet Take 1 tablet (1 mg total) by mouth daily. 30 tablet 11   benzonatate (TESSALON) 100 MG capsule Take 1 capsule (100 mg total) by mouth 3 (three) times daily. 20 capsule 0   Chlorphen-Pseudoephed-APAP (TYLENOL ALLERGY SINUS PO) Take 1 tablet by mouth daily as needed (allergy).     chlorpheniramine-HYDROcodone (TUSSIONEX) 10-8 MG/5ML SUER Take 5 mLs by mouth every 6 (six) hours as needed for cough (If unresponsive to Robitussin DM). 140 mL 0   feeding supplement (ENSURE ENLIVE / ENSURE PLUS) LIQD Take 237 mLs by mouth 3 (three) times daily between meals. 237 mL 12   furosemide (LASIX) 40 MG tablet TAKE 1 TABLET BY MOUTH EVERY DAY 30 tablet 0   guaiFENesin (MUCINEX) 600 MG 12 hr tablet Take 1 tablet (600 mg total) by mouth 2 (two) times daily. 60 tablet 0   LORazepam (ATIVAN) 0.5 MG tablet Take 1 tablet (0.5 mg total) by mouth every 8 (eight) hours as needed for anxiety. Take 1 hour prior to radiation therapy 30 tablet 0   ondansetron (ZOFRAN) 4 MG tablet Take 1 tablet (4 mg total) by mouth every 8 (eight) hours as needed for nausea or  vomiting. 40 tablet 1   oxyCODONE (OXY IR/ROXICODONE) 5 MG immediate release tablet Take 0.5-1 tablets (2.5-5 mg total) by mouth every 4 (four) hours as needed for severe pain (dyspnea). 15 tablet 0   oxymetazoline (AFRIN) 0.05 % nasal spray Place 1 spray into both nostrils daily as needed for congestion.     senna (SENOKOT) 8.6 MG TABS tablet Take 8.6 mg by mouth daily as needed for mild constipation.     No current facility-administered medications for this visit.    PHYSICAL EXAMINATION:  ECOG PERFORMANCE STATUS: 0 - Asymptomatic  Vitals:   08/18/20 0907  BP: (!) 148/67  Pulse: 94  Resp: 18  Temp: 98.6 F (37 C)  SpO2: 98%   Filed Weights   08/18/20 0907  Weight: 110 lb 4.8 oz (50 kg)   Physical Exam Constitutional:  General: She is not in acute distress.    Appearance: She is not ill-appearing.  HENT:     Head: Normocephalic and atraumatic.  Cardiovascular:     Rate and Rhythm: Normal rate and regular rhythm.     Pulses: Normal pulses.     Heart sounds: Normal heart sounds.  Pulmonary:     Effort: Pulmonary effort is normal. No respiratory distress.     Breath sounds: Normal breath sounds. No rales.  Chest:  Breasts:    Left: Mass and skin change present.     Comments: Breast exam, left breast with skin changes from cancer involvement, improving, less nodularity, breast mass more mobile. Left axillary LN less prominent  Abdominal:     General: Abdomen is flat.     Palpations: Abdomen is soft.  Musculoskeletal:        General: Swelling (BLE edema significantly better) present. Normal range of motion.     Cervical back: Normal range of motion and neck supple.     Comments: LUE edema, appears same to me compared to last visit Patient says she has a compression sleeve and she thinks its better  Lymphadenopathy:     Cervical: No cervical adenopathy.  Skin:    General: Skin is warm and dry.  Neurological:     General: No focal deficit present.     Mental  Status: She is alert.  Psychiatric:        Mood and Affect: Mood normal.        Behavior: Behavior normal.     LABORATORY DATA:  I have reviewed the data as listed Lab Results  Component Value Date   WBC 6.7 08/18/2020   HGB 11.1 (L) 08/18/2020   HCT 35.0 (L) 08/18/2020   MCV 82.4 08/18/2020   PLT 276 08/18/2020     Chemistry      Component Value Date/Time   NA 139 08/18/2020 0859   K 3.8 08/18/2020 0859   CL 105 08/18/2020 0859   CO2 25 08/18/2020 0859   BUN 12 08/18/2020 0859   CREATININE 0.72 08/18/2020 0859      Component Value Date/Time   CALCIUM 9.0 08/18/2020 0859   ALKPHOS 183 (H) 08/18/2020 0859   AST 27 08/18/2020 0859   ALT 13 08/18/2020 0859   BILITOT 0.4 08/18/2020 0859       RADIOGRAPHIC STUDIES: I have personally reviewed the radiological images as listed and agreed with the findings in the report. IR Radiologist Eval & Mgmt  Result Date: 07/31/2020 EXAM: Established patient visit CHIEF COMPLAINT: Concern for redness and pain at PleurX site HISTORY OF PRESENT ILLNESS: 80 year old with metastatic breast cancer and history of symptomatic left pleural effusion. A tunneled left pleural drainage catheter was placed on 06/03/2020 by Dr. Pascal Lux. According to the patient, she has not used the drain for over 1 month. She has not been complaining of shortness of breath. According to the patient, approximately 500 mL of fluid was removed the last time the drain was used. Patient presents today because of recent pain and redness around the drain exit site. Patient reports that the redness and soreness has essentially resolved. REVIEW OF SYSTEMS: No recent shortness of breath. PHYSICAL EXAMINATION: General: No acute distress. Chest/abdomen: Left chest PleurX catheter comes out of the anterior left upper abdomen. Mild redness at the suture site. Otherwise, the drain site looks clean and healthy. I examined the left chest with ultrasound and there was left pleural fluid.  Connected the PleurX to  an evacuation bottle and 350 mL of amber colored fluid was removed. Patient had mild coughing after the fluid was removed. Due to the redness at the suture site, the suture was detached from the skin. New dressing was placed at the PleurX catheter. ASSESSMENT AND PLAN: 80 year old with metastatic breast cancer and a tunneled left pleural catheter. Patient was complaining of pain and redness at the drain site but this has essentially resolved. Mild redness appeared to be related to the suture. As a result, the suture was detached from the skin. A new suture was not placed. Patient has not been using the PleurX catheter but I did identify left pleural fluid with ultrasound. 350 mL was removed at today's visit. At this time, I would keep the catheter because the patient clearly still has some pleural fluid although she is asymptomatic. However, if the patient is not using the drainage catheter regularly, I would suggest that we eventually remove the catheter. Patient can follow-up as needed. Electronically Signed   By: Markus Daft M.D.   On: 07/31/2020 12:56    I spent 30 minutes in the care of the patient including H and P, review of records, counseling and coordination of care.   All questions were answered. The patient knows to call the clinic with any problems, questions or concerns.    [name], MD 08/18/2020 9:45 AM

## 2020-08-18 NOTE — Assessment & Plan Note (Signed)
  She is on zometa for bone health. Usually we dont recommend any invasive procedure manipulating roots while on zometa. This is what I conveyed to Ms Pak today. She will be at high risk of ONS of the jaw with any major manipulation.

## 2020-08-18 NOTE — Patient Instructions (Signed)
[facility] ONCOLOGY  Discharge Instructions: Thank you for choosing [facility] to provide your oncology and hematology care.   If you have a lab appointment with the [facility], please go directly to the [facility] and check in at the registration area.   Wear comfortable clothing and clothing appropriate for easy access to any Portacath or PICC line.   We strive to give you quality time with your provider. You may need to reschedule your appointment if you arrive late (15 or more minutes).  Arriving late affects you and other patients whose appointments are after yours.  Also, if you miss three or more appointments without notifying the office, you may be dismissed from the clinic at the provider's discretion.      For prescription refill requests, have your pharmacy contact our office and allow 72 hours for refills to be completed.    Today you received the following chemotherapy and/or immunotherapy agents : Kanjinti    To help prevent nausea and vomiting after your treatment, we encourage you to take your nausea medication as directed.  BELOW ARE SYMPTOMS THAT SHOULD BE REPORTED IMMEDIATELY: *FEVER GREATER THAN 100.4 F (38 C) OR HIGHER *CHILLS OR SWEATING *NAUSEA AND VOMITING THAT IS NOT CONTROLLED WITH YOUR NAUSEA MEDICATION *UNUSUAL SHORTNESS OF BREATH *UNUSUAL BRUISING OR BLEEDING *URINARY PROBLEMS (pain or burning when urinating, or frequent urination) *BOWEL PROBLEMS (unusual diarrhea, constipation, pain near the anus) TENDERNESS IN MOUTH AND THROAT WITH OR WITHOUT PRESENCE OF ULCERS (sore throat, sores in mouth, or a toothache) UNUSUAL RASH, SWELLING OR PAIN  UNUSUAL VAGINAL DISCHARGE OR ITCHING   Items with * indicate a potential emergency and should be followed up as soon as possible or go to the Emergency Department if any problems should occur.  Please show the CHEMOTHERAPY ALERT CARD or IMMUNOTHERAPY ALERT CARD at check-in to  the Emergency Department and triage nurse.  Should you have questions after your visit or need to cancel or reschedule your appointment, please contact [facility]  Dept: [phone]  and follow the prompts.  Office hours are 8:00 a.m. to 4:30 p.m. Monday - Friday. Please note that voicemails left after 4:00 p.m. may not be returned until the following business day.  We are closed weekends and major holidays. You have access to a nurse at all times for urgent questions. Please call the main number to the clinic Dept: [phone] and follow the prompts.   For any non-urgent questions, you may also contact your provider using MyChart. We now offer e-Visits for anyone 39 and older to request care online for non-urgent symptoms. For details visit [email or web address].   Also download the MyChart app! Go to the app store, search "MyChart", open the app, select [facility], and log in with your MyChart username and password.  Due to Covid, a mask is required upon entering the hospital/clinic. If you do not have a mask, one will be given to you upon arrival. For doctor visits, patients may have 1 support person aged 31 or older with them. For treatment visits, patients cannot have anyone with them due to current Covid guidelines and our immunocompromised population.

## 2020-08-18 NOTE — Assessment & Plan Note (Signed)
Significantly better. Will continue to monitor.

## 2020-08-18 NOTE — Assessment & Plan Note (Signed)
This is a very pleasant 80 year old female patient with ER positive, PR negative and HER2 amplified breast cancer currently on Arimidex and Herceptin given her age and PS here for FU Since last visit, she looks remarkably better She is not using oxygen or the wheel chair, BLE significantly better, weight is better, Pleurex was drained 4 weeks ago, 300 cc of fluid. Overall significant clinical improvement. Will repeat imaging to check the status of metastatic carcinoma.

## 2020-08-18 NOTE — Assessment & Plan Note (Signed)
No significant drainage, IR team was wondering if this can be removed. Will repeat imaging and if radiological response to disease, removal of pleurex is reasonable.

## 2020-08-19 ENCOUNTER — Telehealth: Payer: Self-pay

## 2020-08-19 DIAGNOSIS — C787 Secondary malignant neoplasm of liver and intrahepatic bile duct: Secondary | ICD-10-CM | POA: Diagnosis not present

## 2020-08-19 DIAGNOSIS — C7951 Secondary malignant neoplasm of bone: Secondary | ICD-10-CM | POA: Diagnosis not present

## 2020-08-19 DIAGNOSIS — Z4801 Encounter for change or removal of surgical wound dressing: Secondary | ICD-10-CM | POA: Diagnosis not present

## 2020-08-19 DIAGNOSIS — J910 Malignant pleural effusion: Secondary | ICD-10-CM | POA: Diagnosis not present

## 2020-08-19 DIAGNOSIS — I058 Other rheumatic mitral valve diseases: Secondary | ICD-10-CM | POA: Diagnosis not present

## 2020-08-19 DIAGNOSIS — M5431 Sciatica, right side: Secondary | ICD-10-CM | POA: Diagnosis not present

## 2020-08-19 DIAGNOSIS — T8189XA Other complications of procedures, not elsewhere classified, initial encounter: Secondary | ICD-10-CM | POA: Diagnosis not present

## 2020-08-19 DIAGNOSIS — R69 Illness, unspecified: Secondary | ICD-10-CM | POA: Diagnosis not present

## 2020-08-19 DIAGNOSIS — S81002A Unspecified open wound, left knee, initial encounter: Secondary | ICD-10-CM | POA: Diagnosis not present

## 2020-08-19 DIAGNOSIS — C50912 Malignant neoplasm of unspecified site of left female breast: Secondary | ICD-10-CM | POA: Diagnosis not present

## 2020-08-19 DIAGNOSIS — I509 Heart failure, unspecified: Secondary | ICD-10-CM | POA: Diagnosis not present

## 2020-08-19 DIAGNOSIS — Z4803 Encounter for change or removal of drains: Secondary | ICD-10-CM | POA: Diagnosis not present

## 2020-08-19 NOTE — Telephone Encounter (Signed)
Left a voicemail for patient with instructions for upcoming CT scan. Also sent MyChart message.

## 2020-08-26 DIAGNOSIS — C7951 Secondary malignant neoplasm of bone: Secondary | ICD-10-CM | POA: Diagnosis not present

## 2020-08-26 DIAGNOSIS — J910 Malignant pleural effusion: Secondary | ICD-10-CM | POA: Diagnosis not present

## 2020-08-26 DIAGNOSIS — I509 Heart failure, unspecified: Secondary | ICD-10-CM | POA: Diagnosis not present

## 2020-08-26 DIAGNOSIS — C787 Secondary malignant neoplasm of liver and intrahepatic bile duct: Secondary | ICD-10-CM | POA: Diagnosis not present

## 2020-08-26 DIAGNOSIS — C50912 Malignant neoplasm of unspecified site of left female breast: Secondary | ICD-10-CM | POA: Diagnosis not present

## 2020-08-26 DIAGNOSIS — Z4801 Encounter for change or removal of surgical wound dressing: Secondary | ICD-10-CM | POA: Diagnosis not present

## 2020-08-26 DIAGNOSIS — Z4803 Encounter for change or removal of drains: Secondary | ICD-10-CM | POA: Diagnosis not present

## 2020-08-26 DIAGNOSIS — M5431 Sciatica, right side: Secondary | ICD-10-CM | POA: Diagnosis not present

## 2020-08-26 DIAGNOSIS — I058 Other rheumatic mitral valve diseases: Secondary | ICD-10-CM | POA: Diagnosis not present

## 2020-08-26 DIAGNOSIS — R69 Illness, unspecified: Secondary | ICD-10-CM | POA: Diagnosis not present

## 2020-09-01 DIAGNOSIS — Z4801 Encounter for change or removal of surgical wound dressing: Secondary | ICD-10-CM | POA: Diagnosis not present

## 2020-09-01 DIAGNOSIS — M5431 Sciatica, right side: Secondary | ICD-10-CM | POA: Diagnosis not present

## 2020-09-01 DIAGNOSIS — Z4803 Encounter for change or removal of drains: Secondary | ICD-10-CM | POA: Diagnosis not present

## 2020-09-01 DIAGNOSIS — C7951 Secondary malignant neoplasm of bone: Secondary | ICD-10-CM | POA: Diagnosis not present

## 2020-09-01 DIAGNOSIS — J910 Malignant pleural effusion: Secondary | ICD-10-CM | POA: Diagnosis not present

## 2020-09-01 DIAGNOSIS — I058 Other rheumatic mitral valve diseases: Secondary | ICD-10-CM | POA: Diagnosis not present

## 2020-09-01 DIAGNOSIS — C787 Secondary malignant neoplasm of liver and intrahepatic bile duct: Secondary | ICD-10-CM | POA: Diagnosis not present

## 2020-09-01 DIAGNOSIS — I509 Heart failure, unspecified: Secondary | ICD-10-CM | POA: Diagnosis not present

## 2020-09-01 DIAGNOSIS — R69 Illness, unspecified: Secondary | ICD-10-CM | POA: Diagnosis not present

## 2020-09-01 DIAGNOSIS — C50912 Malignant neoplasm of unspecified site of left female breast: Secondary | ICD-10-CM | POA: Diagnosis not present

## 2020-09-02 ENCOUNTER — Other Ambulatory Visit: Payer: Self-pay

## 2020-09-02 ENCOUNTER — Ambulatory Visit (HOSPITAL_COMMUNITY)
Admission: RE | Admit: 2020-09-02 | Discharge: 2020-09-02 | Disposition: A | Discharge status: Home / Self Care | Payer: Medicare HMO | Source: Ambulatory Visit | Attending: Hematology and Oncology | Admitting: Hematology and Oncology

## 2020-09-02 ENCOUNTER — Encounter (HOSPITAL_COMMUNITY): Payer: Self-pay

## 2020-09-02 DIAGNOSIS — J910 Malignant pleural effusion: Secondary | ICD-10-CM | POA: Diagnosis not present

## 2020-09-02 DIAGNOSIS — Z4801 Encounter for change or removal of surgical wound dressing: Secondary | ICD-10-CM | POA: Diagnosis not present

## 2020-09-02 DIAGNOSIS — C7951 Secondary malignant neoplasm of bone: Secondary | ICD-10-CM | POA: Insufficient documentation

## 2020-09-02 DIAGNOSIS — M5431 Sciatica, right side: Secondary | ICD-10-CM | POA: Diagnosis not present

## 2020-09-02 DIAGNOSIS — Z5111 Encounter for antineoplastic chemotherapy: Secondary | ICD-10-CM | POA: Diagnosis not present

## 2020-09-02 DIAGNOSIS — K8020 Calculus of gallbladder without cholecystitis without obstruction: Secondary | ICD-10-CM | POA: Diagnosis not present

## 2020-09-02 DIAGNOSIS — C50912 Malignant neoplasm of unspecified site of left female breast: Secondary | ICD-10-CM | POA: Diagnosis not present

## 2020-09-02 DIAGNOSIS — C787 Secondary malignant neoplasm of liver and intrahepatic bile duct: Secondary | ICD-10-CM | POA: Diagnosis not present

## 2020-09-02 DIAGNOSIS — C50919 Malignant neoplasm of unspecified site of unspecified female breast: Secondary | ICD-10-CM | POA: Diagnosis not present

## 2020-09-02 DIAGNOSIS — I058 Other rheumatic mitral valve diseases: Secondary | ICD-10-CM | POA: Diagnosis not present

## 2020-09-02 DIAGNOSIS — Z4803 Encounter for change or removal of drains: Secondary | ICD-10-CM | POA: Diagnosis not present

## 2020-09-02 DIAGNOSIS — I2510 Atherosclerotic heart disease of native coronary artery without angina pectoris: Secondary | ICD-10-CM | POA: Diagnosis not present

## 2020-09-02 DIAGNOSIS — R69 Illness, unspecified: Secondary | ICD-10-CM | POA: Diagnosis not present

## 2020-09-02 DIAGNOSIS — I509 Heart failure, unspecified: Secondary | ICD-10-CM | POA: Diagnosis not present

## 2020-09-02 DIAGNOSIS — J90 Pleural effusion, not elsewhere classified: Secondary | ICD-10-CM | POA: Diagnosis not present

## 2020-09-02 MED ORDER — IOHEXOL 350 MG/ML SOLN
60.0000 mL | Freq: Once | INTRAVENOUS | Status: AC | PRN
  Administered 2020-09-02: 60 mL via INTRAVENOUS

## 2020-09-05 DIAGNOSIS — C799 Secondary malignant neoplasm of unspecified site: Secondary | ICD-10-CM | POA: Diagnosis not present

## 2020-09-05 DIAGNOSIS — J9601 Acute respiratory failure with hypoxia: Secondary | ICD-10-CM | POA: Diagnosis not present

## 2020-09-05 DIAGNOSIS — J90 Pleural effusion, not elsewhere classified: Secondary | ICD-10-CM | POA: Diagnosis not present

## 2020-09-08 ENCOUNTER — Inpatient Hospital Stay: Payer: Medicare HMO

## 2020-09-08 ENCOUNTER — Other Ambulatory Visit: Payer: Self-pay | Admitting: Hematology

## 2020-09-08 ENCOUNTER — Other Ambulatory Visit: Payer: Self-pay

## 2020-09-08 ENCOUNTER — Inpatient Hospital Stay: Payer: Medicare HMO | Attending: Hematology and Oncology

## 2020-09-08 VITALS — BP 144/67 | HR 87 | Temp 97.9°F | Resp 18 | Wt 109.8 lb

## 2020-09-08 DIAGNOSIS — Z5112 Encounter for antineoplastic immunotherapy: Secondary | ICD-10-CM | POA: Insufficient documentation

## 2020-09-08 DIAGNOSIS — J910 Malignant pleural effusion: Secondary | ICD-10-CM | POA: Diagnosis not present

## 2020-09-08 DIAGNOSIS — I700 Atherosclerosis of aorta: Secondary | ICD-10-CM | POA: Insufficient documentation

## 2020-09-08 DIAGNOSIS — C50912 Malignant neoplasm of unspecified site of left female breast: Secondary | ICD-10-CM | POA: Diagnosis not present

## 2020-09-08 DIAGNOSIS — C787 Secondary malignant neoplasm of liver and intrahepatic bile duct: Secondary | ICD-10-CM | POA: Diagnosis not present

## 2020-09-08 DIAGNOSIS — F411 Generalized anxiety disorder: Secondary | ICD-10-CM

## 2020-09-08 DIAGNOSIS — C50919 Malignant neoplasm of unspecified site of unspecified female breast: Secondary | ICD-10-CM

## 2020-09-08 DIAGNOSIS — C7951 Secondary malignant neoplasm of bone: Secondary | ICD-10-CM

## 2020-09-08 DIAGNOSIS — K8020 Calculus of gallbladder without cholecystitis without obstruction: Secondary | ICD-10-CM | POA: Insufficient documentation

## 2020-09-08 DIAGNOSIS — R600 Localized edema: Secondary | ICD-10-CM | POA: Insufficient documentation

## 2020-09-08 DIAGNOSIS — Z803 Family history of malignant neoplasm of breast: Secondary | ICD-10-CM | POA: Diagnosis not present

## 2020-09-08 DIAGNOSIS — Z79811 Long term (current) use of aromatase inhibitors: Secondary | ICD-10-CM | POA: Insufficient documentation

## 2020-09-08 DIAGNOSIS — Z79899 Other long term (current) drug therapy: Secondary | ICD-10-CM | POA: Insufficient documentation

## 2020-09-08 LAB — CBC WITH DIFFERENTIAL/PLATELET
Abs Immature Granulocytes: 0.01 10*3/uL (ref 0.00–0.07)
Basophils Absolute: 0 10*3/uL (ref 0.0–0.1)
Basophils Relative: 1 %
Eosinophils Absolute: 0.1 10*3/uL (ref 0.0–0.5)
Eosinophils Relative: 2 %
HCT: 35.7 % — ABNORMAL LOW (ref 36.0–46.0)
Hemoglobin: 11.6 g/dL — ABNORMAL LOW (ref 12.0–15.0)
Immature Granulocytes: 0 %
Lymphocytes Relative: 27 %
Lymphs Abs: 1.3 10*3/uL (ref 0.7–4.0)
MCH: 26.4 pg (ref 26.0–34.0)
MCHC: 32.5 g/dL (ref 30.0–36.0)
MCV: 81.1 fL (ref 80.0–100.0)
Monocytes Absolute: 0.8 10*3/uL (ref 0.1–1.0)
Monocytes Relative: 17 %
Neutro Abs: 2.5 10*3/uL (ref 1.7–7.7)
Neutrophils Relative %: 53 %
Platelets: 224 10*3/uL (ref 150–400)
RBC: 4.4 MIL/uL (ref 3.87–5.11)
RDW: 15.6 % — ABNORMAL HIGH (ref 11.5–15.5)
WBC: 4.8 10*3/uL (ref 4.0–10.5)
nRBC: 0 % (ref 0.0–0.2)

## 2020-09-08 MED ORDER — SODIUM CHLORIDE 0.9 % IV SOLN
Freq: Once | INTRAVENOUS | Status: AC
  Filled 2020-09-08: qty 250

## 2020-09-08 MED ORDER — TRASTUZUMAB-ANNS CHEMO 150 MG IV SOLR
300.0000 mg | Freq: Once | INTRAVENOUS | Status: AC
  Administered 2020-09-08: 300 mg via INTRAVENOUS
  Filled 2020-09-08: qty 14.29

## 2020-09-08 NOTE — Progress Notes (Signed)
OK to treat today w/ Echo from 4/27 per Dr. Burr Medico

## 2020-09-08 NOTE — Patient Instructions (Signed)
[facility] ONCOLOGY  Discharge Instructions: Thank you for choosing [facility] to provide your oncology and hematology care.   If you have a lab appointment with the [facility], please go directly to the [facility] and check in at the registration area.   Wear comfortable clothing and clothing appropriate for easy access to any Portacath or PICC line.   We strive to give you quality time with your provider. You may need to reschedule your appointment if you arrive late (15 or more minutes).  Arriving late affects you and other patients whose appointments are after yours.  Also, if you miss three or more appointments without notifying the office, you may be dismissed from the clinic at the provider's discretion.      For prescription refill requests, have your pharmacy contact our office and allow 72 hours for refills to be completed.    Today you received the following chemotherapy and/or immunotherapy agents Trastuzumab-anns (Kanjinti)      To help prevent nausea and vomiting after your treatment, we encourage you to take your nausea medication as directed.  BELOW ARE SYMPTOMS THAT SHOULD BE REPORTED IMMEDIATELY: *FEVER GREATER THAN 100.4 F (38 C) OR HIGHER *CHILLS OR SWEATING *NAUSEA AND VOMITING THAT IS NOT CONTROLLED WITH YOUR NAUSEA MEDICATION *UNUSUAL SHORTNESS OF BREATH *UNUSUAL BRUISING OR BLEEDING *URINARY PROBLEMS (pain or burning when urinating, or frequent urination) *BOWEL PROBLEMS (unusual diarrhea, constipation, pain near the anus) TENDERNESS IN MOUTH AND THROAT WITH OR WITHOUT PRESENCE OF ULCERS (sore throat, sores in mouth, or a toothache) UNUSUAL RASH, SWELLING OR PAIN  UNUSUAL VAGINAL DISCHARGE OR ITCHING   Items with * indicate a potential emergency and should be followed up as soon as possible or go to the Emergency Department if any problems should occur.  Please show the CHEMOTHERAPY ALERT CARD or IMMUNOTHERAPY ALERT  CARD at check-in to the Emergency Department and triage nurse.  Should you have questions after your visit or need to cancel or reschedule your appointment, please contact [facility]  Dept: [phone]  and follow the prompts.  Office hours are 8:00 a.m. to 4:30 p.m. Monday - Friday. Please note that voicemails left after 4:00 p.m. may not be returned until the following business day.  We are closed weekends and major holidays. You have access to a nurse at all times for urgent questions. Please call the main number to the clinic Dept: [phone] and follow the prompts.   For any non-urgent questions, you may also contact your provider using MyChart. We now offer e-Visits for anyone 84 and older to request care online for non-urgent symptoms. For details visit [email or web address].   Also download the MyChart app! Go to the app store, search "MyChart", open the app, select [facility], and log in with your MyChart username and password.  Due to Covid, a mask is required upon entering the hospital/clinic. If you do not have a mask, one will be given to you upon arrival. For doctor visits, patients may have 1 support person aged 75 or older with them. For treatment visits, patients cannot have anyone with them due to current Covid guidelines and our immunocompromised population.

## 2020-09-15 DIAGNOSIS — C50912 Malignant neoplasm of unspecified site of left female breast: Secondary | ICD-10-CM | POA: Diagnosis not present

## 2020-09-15 DIAGNOSIS — I509 Heart failure, unspecified: Secondary | ICD-10-CM | POA: Diagnosis not present

## 2020-09-15 DIAGNOSIS — M19019 Primary osteoarthritis, unspecified shoulder: Secondary | ICD-10-CM | POA: Diagnosis not present

## 2020-09-15 DIAGNOSIS — J42 Unspecified chronic bronchitis: Secondary | ICD-10-CM | POA: Diagnosis not present

## 2020-09-15 DIAGNOSIS — J910 Malignant pleural effusion: Secondary | ICD-10-CM | POA: Diagnosis not present

## 2020-09-15 DIAGNOSIS — M5431 Sciatica, right side: Secondary | ICD-10-CM | POA: Diagnosis not present

## 2020-09-15 DIAGNOSIS — C787 Secondary malignant neoplasm of liver and intrahepatic bile duct: Secondary | ICD-10-CM | POA: Diagnosis not present

## 2020-09-15 DIAGNOSIS — J449 Chronic obstructive pulmonary disease, unspecified: Secondary | ICD-10-CM | POA: Diagnosis not present

## 2020-09-15 DIAGNOSIS — C7951 Secondary malignant neoplasm of bone: Secondary | ICD-10-CM | POA: Diagnosis not present

## 2020-09-15 DIAGNOSIS — R69 Illness, unspecified: Secondary | ICD-10-CM | POA: Diagnosis not present

## 2020-09-20 ENCOUNTER — Other Ambulatory Visit: Payer: Self-pay | Admitting: Hematology and Oncology

## 2020-09-22 ENCOUNTER — Other Ambulatory Visit: Payer: Self-pay

## 2020-09-22 ENCOUNTER — Encounter: Payer: Self-pay | Admitting: Hematology and Oncology

## 2020-09-22 ENCOUNTER — Telehealth: Payer: Self-pay

## 2020-09-22 NOTE — Telephone Encounter (Signed)
Patient called [facility] line for a prescription refill.  Gave pt number to Dr. Rob Hickman office.

## 2020-09-29 NOTE — Progress Notes (Signed)
[place] FOLLOW UP NOTE  Patient Care Team: Patient, [name] as PCP - General (General Practice) [name], MD as Consulting Physician (Hematology and Oncology)  CHIEF COMPLAINTS/PURPOSE OF CONSULTATION:   FU on metastatic breast cancer, recently hospitalized, planned infusion and visit today after discharge.  ASSESSMENT & PLAN:   Carcinoma of breast metastatic to bone [facility]) This is a very pleasant 80 year old female patient with ER positive, PR negative and HER2 amplified breast cancer currently on Arimidex and Herceptin given her age and PS here for FU Since last visit, she continues to do better. Recent imaging consistent with response. Clinically no concern for cardiac compromise, repeat ECHO to be scheduled. Ok to proceed with herceptin as planned today. Continue arimidex.  Bone metastases (HCC) Bone mets, clinically asymptomatic. Will continue Zometa every 3 months Due for it today BMP satisfactory to proceed.  Bilateral lower extremity edema Significantly improved. No change since last visit.  Malignant pleural effusion No evidence of recurrence on clinical exam.   Orders Placed This Encounter  Procedures   Basic Metabolic Panel - [place] Only    Standing Status:   Future    Number of Occurrences:   1    Standing Expiration Date:   09/30/2021   ECHOCARDIOGRAM COMPLETE    Standing Status:   Future    Standing Expiration Date:   09/30/2021    Order Specific Question:   Where should this test be performed    Answer:   [place]    Order Specific Question:   Perflutren DEFINITY (image enhancing agent) should be administered unless hypersensitivity or allergy exist    Answer:   Administer Perflutren    Order Specific Question:   Is a special reader required? (athlete or structural heart)    Answer:   No    Order Specific Question:   Does this study need to be read by the Structural team/Level 3 readers?    Answer:   No     Order Specific Question:   Reason for exam-Echo    Answer:   BPZWC  [id number]  Message sent to scheduling for FU and infusion appointments.  HISTORY OF PRESENTING ILLNESS:   Morgan Yu 80 y.o. female is here because of newly diagnosed metastatic breast cancer.  Oncology History  Carcinoma of breast metastatic to bone ([facility])  03/19/2020 Initial Diagnosis   Carcinoma of breast metastatic to bone ([place])   03/24/2020 -  Chemotherapy    Patient is on Treatment Plan: BREAST TRASTUZUMAB Q21D       04/15/2020 Cancer Staging   Staging form: Breast, AJCC 8th Edition - Clinical stage from 04/15/2020: Stage IV (cTX, cNX, cM1, GX, ER+, PR-, HER2+) - Signed by [name], MD on 04/15/2020 Stage prefix: Initial diagnosis Histologic grading system: 3 grade system    Interim History  Morgan Yu is here for follow-up. Since last visit, she is doing really well. She didn't need thoracentesis in almost 2 months.  No pain, SOB, worsening BLE swelling. She is active, has been cooking, knitting, volunteering No change in bowel habits or urinary habits Left upper extremity edema is better No new dental concerns or recent dental procedures. Rest of the pertinent 10 point ROS reviewed and neg.  MEDICAL HISTORY:  Past Medical History:  Diagnosis Date   Breast cancer [facility])    with bone metastasis    SURGICAL HISTORY: Past Surgical History:  Procedure Laterality Date   IR PERC PLEURAL DRAIN W/INDWELL  CATH W/IMG GUIDE  06/03/2020   IR RADIOLOGIST EVAL & MGMT  07/31/2020    SOCIAL HISTORY: Social History   Socioeconomic History   Marital status: Widowed    Spouse name: Not on file   Number of children: Not on file   Years of education: Not on file   Highest education level: Not on file  Occupational History   Not on file  Tobacco Use   Smoking status: Never   Smokeless tobacco: Never  Vaping Use   Vaping Use: Never used  Substance and Sexual Activity   Alcohol use: Not Currently    Drug use: Never   Sexual activity: Not on file  Other Topics Concern   Not on file  Social History Narrative   No biological children   Has 2 stepchildren   Social Determinants of Health   Financial Resource Strain: Not on file  Food Insecurity: Not on file  Transportation Needs: Not on file  Physical Activity: Not on file  Stress: Not on file  Social Connections: Not on file  Intimate Partner Violence: Not on file    FAMILY HISTORY: Family History  Problem Relation Age of Onset   Stroke Mother    Heart attack Father    Cancer Brother    Esophageal cancer Maternal Aunt    Breast cancer Maternal Aunt     ALLERGIES:  is allergic to vicodin hp [hydrocodone-acetaminophen].  MEDICATIONS:  Current Outpatient Medications  Medication Sig Dispense Refill   acetaminophen (TYLENOL) 500 MG tablet Take 500 mg by mouth every 6 (six) hours as needed for moderate pain.     anastrozole (ARIMIDEX) 1 MG tablet Take 1 tablet (1 mg total) by mouth daily. 30 tablet 11   benzonatate (TESSALON) 100 MG capsule Take 1 capsule (100 mg total) by mouth 3 (three) times daily. 20 capsule 0   Chlorphen-Pseudoephed-APAP (TYLENOL ALLERGY SINUS PO) Take 1 tablet by mouth daily as needed (allergy).     chlorpheniramine-HYDROcodone (TUSSIONEX) 10-8 MG/5ML SUER Take 5 mLs by mouth every 6 (six) hours as needed for cough (If unresponsive to Robitussin DM). 140 mL 0   feeding supplement (ENSURE ENLIVE / ENSURE PLUS) LIQD Take 237 mLs by mouth 3 (three) times daily between meals. 237 mL 12   furosemide (LASIX) 40 MG tablet TAKE 1 TABLET BY MOUTH EVERY DAY 30 tablet 0   guaiFENesin (MUCINEX) 600 MG 12 hr tablet Take 1 tablet (600 mg total) by mouth 2 (two) times daily. 60 tablet 0   LORazepam (ATIVAN) 0.5 MG tablet Take 1 tablet (0.5 mg total) by mouth every 8 (eight) hours as needed for anxiety. Take 1 hour prior to radiation therapy 30 tablet 0   ondansetron (ZOFRAN) 4 MG tablet Take 1 tablet (4 mg total) by  mouth every 8 (eight) hours as needed for nausea or vomiting. 40 tablet 1   oxyCODONE (OXY IR/ROXICODONE) 5 MG immediate release tablet Take 0.5-1 tablets (2.5-5 mg total) by mouth every 4 (four) hours as needed for severe pain (dyspnea). 15 tablet 0   oxymetazoline (AFRIN) 0.05 % nasal spray Place 1 spray into both nostrils daily as needed for congestion.     senna (SENOKOT) 8.6 MG TABS tablet Take 8.6 mg by mouth daily as needed for mild constipation.     No current facility-administered medications for this visit.   Facility-Administered Medications Ordered in Other Visits  Medication Dose Route Frequency Provider Last Rate Last Admin   trastuzumab-anns (KANJINTI) 300 mg in sodium chloride  0.9 % 250 mL chemo infusion  300 mg Intravenous Once [name], MD 528.6 mL/hr at 09/30/20 1435 300 mg at 09/30/20 1435   zolendronic acid (ZOMETA) 3.5 mg in sodium chloride 0.9 % 100 mL IVPB  3.5 mg Intravenous Once Micheil Klaus, Arletha Pili, MD        PHYSICAL EXAMINATION:  ECOG PERFORMANCE STATUS: 0 - Asymptomatic  Vitals:   09/30/20 1311  BP: (!) 170/83  Pulse: 84  Resp: 17  Temp: 98.7 F (37.1 C)  SpO2: 97%   Filed Weights   09/30/20 1311  Weight: 110 lb 12.8 oz (50.3 kg)   Physical Exam Constitutional:      General: She is not in acute distress.    Appearance: She is not ill-appearing.  HENT:     Head: Normocephalic and atraumatic.  Cardiovascular:     Rate and Rhythm: Normal rate and regular rhythm.     Pulses: Normal pulses.     Heart sounds: Normal heart sounds.  Pulmonary:     Effort: Pulmonary effort is normal. No respiratory distress.     Breath sounds: Normal breath sounds. No rales.  Chest:  Breasts:    Left: Mass and skin change present.     Comments: Breast exam, left breast with skin changes stable since last exam Left axillary LN much improved.  Abdominal:     General: Abdomen is flat.     Palpations: Abdomen is soft.  Musculoskeletal:        General: Swelling  (BLE edema significantly better) present. Normal range of motion.     Cervical back: Normal range of motion and neck supple.     Comments: LUE edema has compression sleeve Definitely improved.  Lymphadenopathy:     Cervical: No cervical adenopathy.  Skin:    General: Skin is warm and dry.  Neurological:     General: No focal deficit present.     Mental Status: She is alert.  Psychiatric:        Mood and Affect: Mood normal.        Behavior: Behavior normal.     LABORATORY DATA:  I have reviewed the data as listed Lab Results  Component Value Date   WBC 7.1 09/30/2020   HGB 12.0 09/30/2020   HCT 37.3 09/30/2020   MCV 81.6 09/30/2020   PLT 194 09/30/2020     Chemistry      Component Value Date/Time   NA 142 09/30/2020 1351   K 4.0 09/30/2020 1351   CL 104 09/30/2020 1351   CO2 28 09/30/2020 1351   BUN 19 09/30/2020 1351   CREATININE 0.89 09/30/2020 1351      Component Value Date/Time   CALCIUM 9.4 09/30/2020 1351   ALKPHOS 183 (H) 08/18/2020 0859   AST 27 08/18/2020 0859   ALT 13 08/18/2020 0859   BILITOT 0.4 08/18/2020 0859       RADIOGRAPHIC STUDIES: I have personally reviewed the radiological images as listed and agreed with the findings in the report. CT CHEST ABDOMEN PELVIS W CONTRAST  Result Date: 09/03/2020 CLINICAL DATA:  Metastatic breast cancer with liver and bone metastases. Left arm edema. Ongoing immunotherapy in oral chemotherapy. Radiation therapy completed in May 2022. EXAM: CT CHEST, ABDOMEN, AND PELVIS WITH CONTRAST TECHNIQUE: Multidetector CT imaging of the chest, abdomen and pelvis was performed following the standard protocol during bolus administration of intravenous contrast. CONTRAST:  24m OMNIPAQUE IOHEXOL 350 MG/ML SOLN COMPARISON:  05/26/2020. FINDINGS: CT CHEST FINDINGS Cardiovascular: Atherosclerotic calcification of the  aorta, aortic valve and coronary arteries. Heart is enlarged. No pericardial effusion. Mediastinum/Nodes: No  pathologically enlarged mediastinal, hilar, internal mammary or right axillary lymph nodes. Left axillary adenopathy measures 1.6 cm, stable. Esophagus is grossly unremarkable. Lungs/Pleura: Nodules in the lateral right upper lobe are similar to minimally decreased in size with the largest measuring 7 mm (6/67), compared to 8 mm on 05/26/2020. No new pulmonary nodules. Small bilateral pleural effusions, decreased from 05/26/2020. Interval placement of a PleurX catheter on the left, terminating at the apex. Airway is unremarkable. Musculoskeletal: Mild sclerosis throughout the visualized osseous structures with numerous pathologic rib fractures, as before. CT ABDOMEN PELVIS FINDINGS Hepatobiliary: Similar small low-attenuation lesions throughout the liver. Index nodule in the peripheral right hepatic lobe measures 1.4 cm (2/49), unchanged. Stones in the gallbladder. No biliary ductal dilatation. Pancreas: Negative. Spleen: Negative. Adrenals/Urinary Tract: Adrenal glands and kidneys are unremarkable. Ureters are decompressed. Bladder is low in volume. Stomach/Bowel: Stomach is decompressed. Small bowel, appendix and colon are unremarkable. Vascular/Lymphatic: Atherosclerotic calcification of the aorta. No pathologically enlarged lymph nodes. Reproductive: No adnexal mass. Other: No free fluid.  Mesenteries and peritoneum are unremarkable. Musculoskeletal: Mottled sclerosis throughout the visualized osseous structures, as before. Mild compression of the L5 vertebral body, unchanged. IMPRESSION: 1. Right upper lobe nodules are similar to minimally smaller in size. Similar left axillary, hepatic and osseous metastatic disease. 2. Small bilateral pleural effusions, decreased. 3. Cholelithiasis. 4. Aortic atherosclerosis (ICD10-I70.0). Coronary artery calcification. Electronically Signed   By: Lorin Picket M.D.   On: 09/03/2020 09:46    I spent 30 minutes in the care of the patient including H and P, review of  records, counseling and coordination of care.   All questions were answered. The patient knows to call the clinic with any problems, questions or concerns.    [name], MD 09/30/2020 2:44 PM

## 2020-09-30 ENCOUNTER — Inpatient Hospital Stay: Payer: Medicare HMO

## 2020-09-30 ENCOUNTER — Inpatient Hospital Stay (HOSPITAL_BASED_OUTPATIENT_CLINIC_OR_DEPARTMENT_OTHER): Payer: Medicare HMO | Admitting: Hematology and Oncology

## 2020-09-30 ENCOUNTER — Encounter: Payer: Self-pay | Admitting: Hematology and Oncology

## 2020-09-30 ENCOUNTER — Other Ambulatory Visit: Payer: Self-pay

## 2020-09-30 VITALS — BP 170/83 | HR 84 | Temp 98.7°F | Resp 17 | Ht 61.0 in | Wt 110.8 lb

## 2020-09-30 DIAGNOSIS — F411 Generalized anxiety disorder: Secondary | ICD-10-CM

## 2020-09-30 DIAGNOSIS — C50912 Malignant neoplasm of unspecified site of left female breast: Secondary | ICD-10-CM | POA: Diagnosis not present

## 2020-09-30 DIAGNOSIS — C7951 Secondary malignant neoplasm of bone: Secondary | ICD-10-CM

## 2020-09-30 DIAGNOSIS — R600 Localized edema: Secondary | ICD-10-CM

## 2020-09-30 DIAGNOSIS — R5383 Other fatigue: Secondary | ICD-10-CM

## 2020-09-30 DIAGNOSIS — I700 Atherosclerosis of aorta: Secondary | ICD-10-CM | POA: Diagnosis not present

## 2020-09-30 DIAGNOSIS — C50919 Malignant neoplasm of unspecified site of unspecified female breast: Secondary | ICD-10-CM

## 2020-09-30 DIAGNOSIS — Z5112 Encounter for antineoplastic immunotherapy: Secondary | ICD-10-CM | POA: Diagnosis not present

## 2020-09-30 DIAGNOSIS — J910 Malignant pleural effusion: Secondary | ICD-10-CM | POA: Diagnosis not present

## 2020-09-30 DIAGNOSIS — Z79899 Other long term (current) drug therapy: Secondary | ICD-10-CM | POA: Diagnosis not present

## 2020-09-30 DIAGNOSIS — C787 Secondary malignant neoplasm of liver and intrahepatic bile duct: Secondary | ICD-10-CM | POA: Diagnosis not present

## 2020-09-30 DIAGNOSIS — Z79811 Long term (current) use of aromatase inhibitors: Secondary | ICD-10-CM | POA: Diagnosis not present

## 2020-09-30 DIAGNOSIS — K8020 Calculus of gallbladder without cholecystitis without obstruction: Secondary | ICD-10-CM | POA: Diagnosis not present

## 2020-09-30 LAB — CBC WITH DIFFERENTIAL/PLATELET
Abs Immature Granulocytes: 0.02 10*3/uL (ref 0.00–0.07)
Basophils Absolute: 0 10*3/uL (ref 0.0–0.1)
Basophils Relative: 0 %
Eosinophils Absolute: 0.1 10*3/uL (ref 0.0–0.5)
Eosinophils Relative: 2 %
HCT: 37.3 % (ref 36.0–46.0)
Hemoglobin: 12 g/dL (ref 12.0–15.0)
Immature Granulocytes: 0 %
Lymphocytes Relative: 19 %
Lymphs Abs: 1.3 10*3/uL (ref 0.7–4.0)
MCH: 26.3 pg (ref 26.0–34.0)
MCHC: 32.2 g/dL (ref 30.0–36.0)
MCV: 81.6 fL (ref 80.0–100.0)
Monocytes Absolute: 1.1 10*3/uL — ABNORMAL HIGH (ref 0.1–1.0)
Monocytes Relative: 16 %
Neutro Abs: 4.4 10*3/uL (ref 1.7–7.7)
Neutrophils Relative %: 63 %
Platelets: 194 10*3/uL (ref 150–400)
RBC: 4.57 MIL/uL (ref 3.87–5.11)
RDW: 15.9 % — ABNORMAL HIGH (ref 11.5–15.5)
WBC: 7.1 10*3/uL (ref 4.0–10.5)
nRBC: 0 % (ref 0.0–0.2)

## 2020-09-30 LAB — BASIC METABOLIC PANEL - CANCER CENTER ONLY
Anion gap: 10 (ref 5–15)
BUN: 19 mg/dL (ref 8–23)
CO2: 28 mmol/L (ref 22–32)
Calcium: 9.4 mg/dL (ref 8.9–10.3)
Chloride: 104 mmol/L (ref 98–111)
Creatinine: 0.89 mg/dL (ref 0.44–1.00)
GFR, Estimated: >60 mL/min (ref >60)
Glucose, Bld: 106 mg/dL — ABNORMAL HIGH (ref 70–99)
Potassium: 4 mmol/L (ref 3.5–5.1)
Sodium: 142 mmol/L (ref 135–145)

## 2020-09-30 MED ORDER — TRASTUZUMAB-ANNS CHEMO 150 MG IV SOLR
300.0000 mg | Freq: Once | INTRAVENOUS | Status: AC
  Administered 2020-09-30: 300 mg via INTRAVENOUS
  Filled 2020-09-30: qty 14.29

## 2020-09-30 MED ORDER — ZOLEDRONIC ACID 4 MG/5ML IV CONC
3.5000 mg | Freq: Once | INTRAVENOUS | Status: AC
  Administered 2020-09-30: 3.5 mg via INTRAVENOUS
  Filled 2020-09-30: qty 4.38

## 2020-09-30 MED ORDER — SODIUM CHLORIDE 0.9 % IV SOLN: Freq: Once | INTRAVENOUS | Status: AC

## 2020-09-30 NOTE — Progress Notes (Signed)
Per MD ok to proceed with trastuzumab with last echo 05/28/20.

## 2020-09-30 NOTE — Patient Instructions (Signed)
[facility] ONCOL[facility]  Discharge Instructions: Thank you for choosing [facility] to provide your oncology and hematology care.   If you have a lab appointment with the [facility], please go directly to the [facility] and check in at the registration area.   Wear comfortable clothing and clothing appropriate for easy access to any Portacath or PICC line.   We strive to give you quality time with your provider. You may need to reschedule your appointment if you arrive late (15 or more minutes).  Arriving late affects you and other patients whose appointments are after yours.  Also, if you miss three or more appointments without notifying the office, you may be dismissed from the clinic at the provider's discretion.      For prescription refill requests, have your pharmacy contact our office and allow 72 hours for refills to be completed.    Today you received the following chemotherapy and/or immunotherapy agents Herceptin and zometa      To help prevent nausea and vomiting after your treatment, we encourage you to take your nausea medication as directed.  BELOW ARE SYMPTOMS THAT SHOULD BE REPORTED IMMEDIATELY: *FEVER GREATER THAN 100.4 F (38 C) OR HIGHER *CHILLS OR SWEATING *NAUSEA AND VOMITING THAT IS NOT CONTROLLED WITH YOUR NAUSEA MEDICATION *UNUSUAL SHORTNESS OF BREATH *UNUSUAL BRUISING OR BLEEDING *URINARY PROBLEMS (pain or burning when urinating, or frequent urination) *BOWEL PROBLEMS (unusual diarrhea, constipation, pain near the anus) TENDERNESS IN MOUTH AND THROAT WITH OR WITHOUT PRESENCE OF ULCERS (sore throat, sores in mouth, or a toothache) UNUSUAL RASH, SWELLING OR PAIN  UNUSUAL VAGINAL DISCHARGE OR ITCHING   Items with * indicate a potential emergency and should be followed up as soon as possible or go to the Emergency Department if any problems should occur.  Please show the CHEMOTHERAPY ALERT CARD or IMMUNOTHERAPY ALERT CARD at  check-in to the Emergency Department and triage nurse.  Should you have questions after your visit or need to cancel or reschedule your appointment, please contact [facility]  Dept: [phone]  and follow the prompts.  Office hours are 8:00 a.m. to 4:30 p.m. Monday - Friday. Please note that voicemails left after 4:00 p.m. may not be returned until the following business day.  We are closed weekends and major holidays. You have access to a nurse at all times for urgent questions. Please call the main number to the clinic Dept: [phone] and follow the prompts.   For any non-urgent questions, you may also contact your provider using MyChart. We now offer e-Visits for anyone 60 and older to request care online for non-urgent symptoms. For details visit [email or web address].   Also download the MyChart app! Go to the app store, search "MyChart", open the app, select [facility], and log in with your MyChart username and password.  Due to Covid, a mask is required upon entering the hospital/clinic. If you do not have a mask, one will be given to you upon arrival. For doctor visits, patients may have 1 support person aged 64 or older with them. For treatment visits, patients cannot have anyone with them due to current Covid guidelines and our immunocompromised population.

## 2020-09-30 NOTE — Assessment & Plan Note (Signed)
No evidence of recurrence on clinical exam.

## 2020-09-30 NOTE — Assessment & Plan Note (Signed)
This is a very pleasant 80 year old female patient with ER positive, PR negative and HER2 amplified breast cancer currently on Arimidex and Herceptin given her age and PS here for FU Since last visit, she continues to do better. Recent imaging consistent with response. Clinically no concern for cardiac compromise, repeat ECHO to be scheduled. Ok to proceed with herceptin as planned today. Continue arimidex.

## 2020-09-30 NOTE — Assessment & Plan Note (Signed)
Significantly improved. No change since last visit.

## 2020-09-30 NOTE — Assessment & Plan Note (Addendum)
Bone mets, clinically asymptomatic. Will continue Zometa every 3 months Due for it today BMP satisfactory to proceed.

## 2020-10-03 DIAGNOSIS — M5431 Sciatica, right side: Secondary | ICD-10-CM | POA: Diagnosis not present

## 2020-10-03 DIAGNOSIS — R69 Illness, unspecified: Secondary | ICD-10-CM | POA: Diagnosis not present

## 2020-10-03 DIAGNOSIS — C787 Secondary malignant neoplasm of liver and intrahepatic bile duct: Secondary | ICD-10-CM | POA: Diagnosis not present

## 2020-10-03 DIAGNOSIS — J449 Chronic obstructive pulmonary disease, unspecified: Secondary | ICD-10-CM | POA: Diagnosis not present

## 2020-10-03 DIAGNOSIS — C50912 Malignant neoplasm of unspecified site of left female breast: Secondary | ICD-10-CM | POA: Diagnosis not present

## 2020-10-03 DIAGNOSIS — C7951 Secondary malignant neoplasm of bone: Secondary | ICD-10-CM | POA: Diagnosis not present

## 2020-10-03 DIAGNOSIS — J42 Unspecified chronic bronchitis: Secondary | ICD-10-CM | POA: Diagnosis not present

## 2020-10-03 DIAGNOSIS — J910 Malignant pleural effusion: Secondary | ICD-10-CM | POA: Diagnosis not present

## 2020-10-03 DIAGNOSIS — M19019 Primary osteoarthritis, unspecified shoulder: Secondary | ICD-10-CM | POA: Diagnosis not present

## 2020-10-03 DIAGNOSIS — I509 Heart failure, unspecified: Secondary | ICD-10-CM | POA: Diagnosis not present

## 2020-10-04 DIAGNOSIS — C50919 Malignant neoplasm of unspecified site of unspecified female breast: Secondary | ICD-10-CM | POA: Diagnosis not present

## 2020-10-04 DIAGNOSIS — J918 Pleural effusion in other conditions classified elsewhere: Secondary | ICD-10-CM | POA: Diagnosis not present

## 2020-10-06 DIAGNOSIS — J90 Pleural effusion, not elsewhere classified: Secondary | ICD-10-CM | POA: Diagnosis not present

## 2020-10-06 DIAGNOSIS — C799 Secondary malignant neoplasm of unspecified site: Secondary | ICD-10-CM | POA: Diagnosis not present

## 2020-10-06 DIAGNOSIS — J9601 Acute respiratory failure with hypoxia: Secondary | ICD-10-CM | POA: Diagnosis not present

## 2020-10-16 DIAGNOSIS — M19019 Primary osteoarthritis, unspecified shoulder: Secondary | ICD-10-CM | POA: Diagnosis not present

## 2020-10-16 DIAGNOSIS — C787 Secondary malignant neoplasm of liver and intrahepatic bile duct: Secondary | ICD-10-CM | POA: Diagnosis not present

## 2020-10-16 DIAGNOSIS — J910 Malignant pleural effusion: Secondary | ICD-10-CM | POA: Diagnosis not present

## 2020-10-16 DIAGNOSIS — J42 Unspecified chronic bronchitis: Secondary | ICD-10-CM | POA: Diagnosis not present

## 2020-10-16 DIAGNOSIS — M5431 Sciatica, right side: Secondary | ICD-10-CM | POA: Diagnosis not present

## 2020-10-16 DIAGNOSIS — J449 Chronic obstructive pulmonary disease, unspecified: Secondary | ICD-10-CM | POA: Diagnosis not present

## 2020-10-16 DIAGNOSIS — C7951 Secondary malignant neoplasm of bone: Secondary | ICD-10-CM | POA: Diagnosis not present

## 2020-10-16 DIAGNOSIS — R69 Illness, unspecified: Secondary | ICD-10-CM | POA: Diagnosis not present

## 2020-10-16 DIAGNOSIS — I509 Heart failure, unspecified: Secondary | ICD-10-CM | POA: Diagnosis not present

## 2020-10-16 DIAGNOSIS — C50912 Malignant neoplasm of unspecified site of left female breast: Secondary | ICD-10-CM | POA: Diagnosis not present

## 2020-10-19 ENCOUNTER — Other Ambulatory Visit: Payer: Self-pay | Admitting: Hematology and Oncology

## 2020-10-20 ENCOUNTER — Other Ambulatory Visit: Payer: Self-pay

## 2020-10-20 ENCOUNTER — Ambulatory Visit (HOSPITAL_COMMUNITY)
Admission: RE | Admit: 2020-10-20 | Discharge: 2020-10-20 | Disposition: A | Discharge status: Home / Self Care | Payer: Medicare HMO | Source: Ambulatory Visit | Attending: Hematology and Oncology | Admitting: Hematology and Oncology

## 2020-10-20 DIAGNOSIS — Z79899 Other long term (current) drug therapy: Secondary | ICD-10-CM | POA: Diagnosis not present

## 2020-10-20 DIAGNOSIS — C50912 Malignant neoplasm of unspecified site of left female breast: Secondary | ICD-10-CM | POA: Insufficient documentation

## 2020-10-20 DIAGNOSIS — Z0189 Encounter for other specified special examinations: Secondary | ICD-10-CM | POA: Diagnosis not present

## 2020-10-20 DIAGNOSIS — Z01818 Encounter for other preprocedural examination: Secondary | ICD-10-CM | POA: Diagnosis present

## 2020-10-20 DIAGNOSIS — C7951 Secondary malignant neoplasm of bone: Secondary | ICD-10-CM | POA: Insufficient documentation

## 2020-10-20 DIAGNOSIS — R5383 Other fatigue: Secondary | ICD-10-CM | POA: Diagnosis not present

## 2020-10-20 LAB — ECHOCARDIOGRAM COMPLETE
Area-P 1/2: 4.6 cm2
S' Lateral: 2.9 cm

## 2020-10-20 NOTE — Progress Notes (Signed)
  Echocardiogram 2D Echocardiogram has been performed.  Morgan Yu 10/20/2020, 10:44 AM

## 2020-10-21 ENCOUNTER — Inpatient Hospital Stay: Payer: Medicare HMO

## 2020-10-21 ENCOUNTER — Inpatient Hospital Stay: Payer: Medicare HMO | Attending: Hematology and Oncology

## 2020-10-21 ENCOUNTER — Encounter: Payer: Self-pay | Admitting: Hematology and Oncology

## 2020-10-21 ENCOUNTER — Inpatient Hospital Stay: Payer: Medicare HMO | Admitting: Hematology and Oncology

## 2020-10-21 VITALS — BP 181/73 | HR 95 | Temp 98.2°F | Resp 18 | Ht 61.0 in | Wt 113.4 lb

## 2020-10-21 VITALS — BP 160/67 | HR 95 | Resp 18

## 2020-10-21 DIAGNOSIS — R600 Localized edema: Secondary | ICD-10-CM | POA: Diagnosis not present

## 2020-10-21 DIAGNOSIS — Z170 Estrogen receptor positive status [ER+]: Secondary | ICD-10-CM | POA: Diagnosis not present

## 2020-10-21 DIAGNOSIS — C7951 Secondary malignant neoplasm of bone: Secondary | ICD-10-CM

## 2020-10-21 DIAGNOSIS — F411 Generalized anxiety disorder: Secondary | ICD-10-CM

## 2020-10-21 DIAGNOSIS — Z5112 Encounter for antineoplastic immunotherapy: Secondary | ICD-10-CM | POA: Diagnosis present

## 2020-10-21 DIAGNOSIS — C50912 Malignant neoplasm of unspecified site of left female breast: Secondary | ICD-10-CM | POA: Diagnosis not present

## 2020-10-21 DIAGNOSIS — J910 Malignant pleural effusion: Secondary | ICD-10-CM

## 2020-10-21 DIAGNOSIS — C50919 Malignant neoplasm of unspecified site of unspecified female breast: Secondary | ICD-10-CM

## 2020-10-21 LAB — CBC WITH DIFFERENTIAL/PLATELET
Abs Immature Granulocytes: 0.04 10*3/uL (ref 0.00–0.07)
Basophils Absolute: 0 10*3/uL (ref 0.0–0.1)
Basophils Relative: 0 %
Eosinophils Absolute: 0.1 10*3/uL (ref 0.0–0.5)
Eosinophils Relative: 1 %
HCT: 35.8 % — ABNORMAL LOW (ref 36.0–46.0)
Hemoglobin: 11.7 g/dL — ABNORMAL LOW (ref 12.0–15.0)
Immature Granulocytes: 0 %
Lymphocytes Relative: 16 %
Lymphs Abs: 1.6 10*3/uL (ref 0.7–4.0)
MCH: 26.3 pg (ref 26.0–34.0)
MCHC: 32.7 g/dL (ref 30.0–36.0)
MCV: 80.4 fL (ref 80.0–100.0)
Monocytes Absolute: 1.2 10*3/uL — ABNORMAL HIGH (ref 0.1–1.0)
Monocytes Relative: 12 %
Neutro Abs: 7.1 10*3/uL (ref 1.7–7.7)
Neutrophils Relative %: 71 %
Platelets: 190 10*3/uL (ref 150–400)
RBC: 4.45 MIL/uL (ref 3.87–5.11)
RDW: 15.7 % — ABNORMAL HIGH (ref 11.5–15.5)
WBC: 10 10*3/uL (ref 4.0–10.5)
nRBC: 0 % (ref 0.0–0.2)

## 2020-10-21 LAB — BASIC METABOLIC PANEL - CANCER CENTER ONLY
Anion gap: 12 (ref 5–15)
BUN: 21 mg/dL (ref 8–23)
CO2: 27 mmol/L (ref 22–32)
Calcium: 9.5 mg/dL (ref 8.9–10.3)
Chloride: 102 mmol/L (ref 98–111)
Creatinine: 0.79 mg/dL (ref 0.44–1.00)
GFR, Estimated: >60 mL/min (ref >60)
Glucose, Bld: 127 mg/dL — ABNORMAL HIGH (ref 70–99)
Potassium: 3.7 mmol/L (ref 3.5–5.1)
Sodium: 141 mmol/L (ref 135–145)

## 2020-10-21 MED ORDER — TRASTUZUMAB-ANNS CHEMO 150 MG IV SOLR
300.0000 mg | Freq: Once | INTRAVENOUS | Status: AC
  Administered 2020-10-21: 300 mg via INTRAVENOUS
  Filled 2020-10-21: qty 14.29

## 2020-10-21 MED ORDER — SODIUM CHLORIDE 0.9 % IV SOLN: Freq: Once | INTRAVENOUS | Status: AC

## 2020-10-21 NOTE — Assessment & Plan Note (Signed)
No evidence of reaccumulation on clinical exam.  We will attempt to remove the Pleurx since she has not needed any thoracentesis in the past 3 months.

## 2020-10-21 NOTE — Progress Notes (Signed)
Morgan Yu FOLLOW UP NOTE  Patient Care Team: Patient, [name] as PCP - General (General Practice) [name], MD as Consulting Physician (Hematology and Oncology)  CHIEF COMPLAINTS/PURPOSE OF CONSULTATION:   FU on metastatic breast cancer, recently hospitalized, planned infusion and visit today after discharge.  ASSESSMENT & PLAN:   Carcinoma of breast metastatic to bone [facility]) This is a very pleasant 80 year old female patient with ER positive, PR negative and HER2 amplified breast cancer currently on Arimidex and Herceptin given her age and PS here for FU Since last visit, she has been doing remarkably well.  Clinically, her lower extremity edema and left upper extremity edema have continued to be stable.  No indication for repeat thoracentesis.  Breast appearance again improved to stable. Plan is to continue the above-mentioned regimen, follow-up with [place] every 6 weeks can repeat imaging in 3 to 4 months. She would like to have the Pleurx removed since she has not been using it.  Her last thoracentesis was about 3 months ago.  This is reasonable, IR order has been placed.  Bilateral lower extremity edema This was most likely related to hypoalbuminemia.  This continues to improve, she takes Lasix 40 mg p.o. daily, okay to continue current regimen.  She would like to try half dose of Lasix and see if the lower extremity edema will worsen.  She could try this for about a week and if she starts feeling more short of breath and increased leg swelling, I would recommend continuing the 40 mg daily.  Malignant pleural effusion No evidence of reaccumulation on clinical exam.  We will attempt to remove the Pleurx since she has not needed any thoracentesis in the past 3 months.   Orders Placed This Encounter  Procedures   CT CHEST ABDOMEN PELVIS W CONTRAST    Standing Status:   Future    Standing Expiration Date:   10/21/2021    Order Specific Question:    Preferred imaging location?    Answer:   [facility]    Order Specific Question:   Radiology Contrast Protocol - do NOT remove file path    Answer:   [email or web address]   IR Radiologist Eval & Mgmt    Standing Status:   Future    Standing Expiration Date:   10/21/2021    Order Specific Question:   Reason for Exam (SYMPTOM  OR DIAGNOSIS REQUIRED)    Answer:   Consider pleurex removal    Order Specific Question:   Preferred Imaging Location?    Answer:   [facility]   ECHOCARDIOGRAM COMPLETE    Standing Status:   Future    Standing Expiration Date:   10/21/2021    Order Specific Question:   Where should this test be performed    Answer:   [place]    Order Specific Question:   Perflutren DEFINITY (image enhancing agent) should be administered unless hypersensitivity or allergy exist    Answer:   Administer Perflutren    Order Specific Question:   Is a special reader required? (athlete or structural heart)    Answer:   No    Order Specific Question:   Does this study need to be read by the Structural team/Level 3 readers?    Answer:   No    Order Specific Question:   Reason for exam-Echo    Answer:   Chemo  Z09    HISTORY OF PRESENTING ILLNESS:   Morgan Yu  80 y.o. female is here because of newly diagnosed metastatic breast cancer.  Oncology History  Carcinoma of breast metastatic to bone ([place])  03/19/2020 Initial Diagnosis   Carcinoma of breast metastatic to bone ([place])   03/24/2020 -  Chemotherapy    Patient is on Treatment Plan: BREAST TRASTUZUMAB Q21D       04/15/2020 Cancer Staging   Staging form: Breast, AJCC 8th Edition - Clinical stage from 04/15/2020: Stage IV (cTX, cNX, cM1, GX, ER+, PR-, HER2+) - Signed by [name], MD on 04/15/2020 Stage prefix: Initial diagnosis Histologic grading system: 3 grade system    Interim History  [name] is here for follow-up. She continues to do very well.  Since  her last visit no new health complaints.  She denies any worsening shortness of breath requiring Pleurx drainage.  Last thoracentesis about 3 months ago.  She has been independent with all her activities of daily living, driving, volunteering.  She has been cooking and eating well.  She takes 40 mg of Lasix once a day.  She has the left upper extremity in a sleeve.  No change in bowel habits or urinary habits.  She is compliant with Arimidex. Rest of the pertinent 10 point ROS reviewed and neg.  MEDICAL HISTORY:  Past Medical History:  Diagnosis Date   Breast cancer [facility])    with bone metastasis    SURGICAL HISTORY: Past Surgical History:  Procedure Laterality Date   IR PERC PLEURAL DRAIN W/INDWELL CATH W/IMG GUIDE  06/03/2020   IR RADIOLOGIST EVAL & MGMT  07/31/2020    SOCIAL HISTORY: Social History   Socioeconomic History   Marital status: Widowed    Spouse name: Not on file   Number of children: Not on file   Years of education: Not on file   Highest education level: Not on file  Occupational History   Not on file  Tobacco Use   Smoking status: Never   Smokeless tobacco: Never  Vaping Use   Vaping Use: Never used  Substance and Sexual Activity   Alcohol use: Not Currently   Drug use: Never   Sexual activity: Not on file  Other Topics Concern   Not on file  Social History Narrative   No biological children   Has 2 stepchildren   Social Determinants of Health   Financial Resource Strain: Not on file  Food Insecurity: Not on file  Transportation Needs: Not on file  Physical Activity: Not on file  Stress: Not on file  Social Connections: Not on file  Intimate Partner Violence: Not on file    FAMILY HISTORY: Family History  Problem Relation Age of Onset   Stroke Mother    Heart attack Father    Cancer Brother    Esophageal cancer Maternal Aunt    Breast cancer Maternal Aunt     ALLERGIES:  is allergic to vicodin hp  [hydrocodone-acetaminophen].  MEDICATIONS:  Current Outpatient Medications  Medication Sig Dispense Refill   anastrozole (ARIMIDEX) 1 MG tablet Take 1 tablet (1 mg total) by mouth daily. 30 tablet 11   furosemide (LASIX) 40 MG tablet TAKE 1 TABLET BY MOUTH EVERY DAY 30 tablet 0   acetaminophen (TYLENOL) 500 MG tablet Take 500 mg by mouth every 6 (six) hours as needed for moderate pain. (Patient not taking: Reported on 10/21/2020)     benzonatate (TESSALON) 100 MG capsule Take 1 capsule (100 mg total) by mouth 3 (three) times daily. (Patient not taking: Reported on 10/21/2020) 20  capsule 0   Chlorphen-Pseudoephed-APAP (TYLENOL ALLERGY SINUS PO) Take 1 tablet by mouth daily as needed (allergy). (Patient not taking: Reported on 10/21/2020)     chlorpheniramine-HYDROcodone (TUSSIONEX) 10-8 MG/5ML SUER Take 5 mLs by mouth every 6 (six) hours as needed for cough (If unresponsive to Robitussin DM). (Patient not taking: Reported on 10/21/2020) 140 mL 0   feeding supplement (ENSURE ENLIVE / ENSURE PLUS) LIQD Take 237 mLs by mouth 3 (three) times daily between meals. (Patient not taking: Reported on 10/21/2020) 237 mL 12   guaiFENesin (MUCINEX) 600 MG 12 hr tablet Take 1 tablet (600 mg total) by mouth 2 (two) times daily. (Patient not taking: Reported on 10/21/2020) 60 tablet 0   LORazepam (ATIVAN) 0.5 MG tablet Take 1 tablet (0.5 mg total) by mouth every 8 (eight) hours as needed for anxiety. Take 1 hour prior to radiation therapy (Patient not taking: Reported on 10/21/2020) 30 tablet 0   ondansetron (ZOFRAN) 4 MG tablet Take 1 tablet (4 mg total) by mouth every 8 (eight) hours as needed for nausea or vomiting. (Patient not taking: Reported on 10/21/2020) 40 tablet 1   oxyCODONE (OXY IR/ROXICODONE) 5 MG immediate release tablet Take 0.5-1 tablets (2.5-5 mg total) by mouth every 4 (four) hours as needed for severe pain (dyspnea). (Patient not taking: Reported on 10/21/2020) 15 tablet 0   oxymetazoline (AFRIN) 0.05 %  nasal spray Place 1 spray into both nostrils daily as needed for congestion. (Patient not taking: Reported on 10/21/2020)     senna (SENOKOT) 8.6 MG TABS tablet Take 8.6 mg by mouth daily as needed for mild constipation. (Patient not taking: Reported on 10/21/2020)     No current facility-administered medications for this visit.    PHYSICAL EXAMINATION:  ECOG PERFORMANCE STATUS: 0 - Asymptomatic  Vitals:   10/21/20 1222  BP: (!) 181/73  Pulse: 95  Resp: 18  Temp: 98.2 F (36.8 C)  SpO2: 100%   Filed Weights   10/21/20 1222  Weight: 113 lb 6.4 oz (51.4 kg)   Physical Exam Constitutional:      General: She is not in acute distress.    Appearance: She is not ill-appearing.  HENT:     Head: Normocephalic and atraumatic.  Cardiovascular:     Rate and Rhythm: Normal rate and regular rhythm.     Pulses: Normal pulses.     Heart sounds: Normal heart sounds.  Pulmonary:     Effort: Pulmonary effort is normal. No respiratory distress.     Breath sounds: Normal breath sounds. No rales.  Chest:  Breasts:    Left: Mass and skin change present.     Comments: Breast exam, left breast with skin changes stable since last exam Left axillary LN continues to be stable and significantly improved compared to time of initial diagnosis.  Abdominal:     General: Abdomen is flat.     Palpations: Abdomen is soft.  Musculoskeletal:        General: Swelling (BLE 1 + edema, continues on lasix) present. Normal range of motion.     Cervical back: Normal range of motion and neck supple.     Comments: LUE edema has compression sleeve Definitely improved.  Lymphadenopathy:     Cervical: No cervical adenopathy.  Skin:    General: Skin is warm and dry.  Neurological:     General: No focal deficit present.     Mental Status: She is alert.  Psychiatric:        Mood and  Affect: Mood normal.        Behavior: Behavior normal.     LABORATORY DATA:  I have reviewed the data as listed Lab Results   Component Value Date   WBC 10.0 10/21/2020   HGB 11.7 (L) 10/21/2020   HCT 35.8 (L) 10/21/2020   MCV 80.4 10/21/2020   PLT 190 10/21/2020     Chemistry      Component Value Date/Time   NA 141 10/21/2020 1159   K 3.7 10/21/2020 1159   CL 102 10/21/2020 1159   CO2 27 10/21/2020 1159   BUN 21 10/21/2020 1159   CREATININE 0.79 10/21/2020 1159      Component Value Date/Time   CALCIUM 9.5 10/21/2020 1159   ALKPHOS 183 (H) 08/18/2020 0859   AST 27 08/18/2020 0859   ALT 13 08/18/2020 0859   BILITOT 0.4 08/18/2020 0859      Labs from today with white blood cell count of 10,000, hemoglobin of 11.7 and platelets 190,000. Last zometa on 09/30/2020  RADIOGRAPHIC STUDIES: I have personally reviewed the radiological images as listed and agreed with the findings in the report. ECHOCARDIOGRAM COMPLETE  Result Date: 10/20/2020    ECHOCARDIOGRAM REPORT   Patient Name:   Morgan Yu Date of Exam: 10/20/2020 Medical Rec #:  [id number]      Height:       61.0 in Accession #:    [id number]     Weight:       110.8 lb Date of Birth:  [birth year]       BSA:          1.469 m Patient Age:    80 years       BP:           146/71 mmHg Patient Gender: F              HR:           92 bpm. Exam Location:  Outpatient Procedure: 2D Echo, Cardiac Doppler, Color Doppler and Strain Analysis Indications:    Chemo Z09  History:        Patient has prior history of Echocardiogram examinations, most                 recent 05/28/2020.  Sonographer:    Bernadene Person RDCS Referring Phys: [id number] [place]  1. Left ventricular ejection fraction, by estimation, is 60 to 65%. The left ventricle has normal function. The left ventricle has no regional wall motion abnormalities. Left ventricular diastolic parameters were normal.  2. Right ventricular systolic function is normal. The right ventricular size is normal.  3. The mitral valve is normal in structure. Trivial mitral valve regurgitation.  4. The aortic valve is  normal in structure. Aortic valve regurgitation is not visualized. FINDINGS  Left Ventricle: Left ventricular ejection fraction, by estimation, is 60 to 65%. The left ventricle has normal function. The left ventricle has no regional wall motion abnormalities. The left ventricular internal cavity size was normal in size. There is  no left ventricular hypertrophy. Left ventricular diastolic parameters were normal. Right Ventricle: The right ventricular size is normal. Right vetricular wall thickness was not well visualized. Right ventricular systolic function is normal. Left Atrium: Left atrial size was normal in size. Right Atrium: Right atrial size was normal in size. Pericardium: There is no evidence of pericardial effusion. Mitral Valve: The mitral valve is normal in structure. Trivial mitral valve regurgitation. Tricuspid Valve: The tricuspid valve is normal in  structure. Tricuspid valve regurgitation is trivial. Aortic Valve: The aortic valve is normal in structure. Aortic valve regurgitation is not visualized. Pulmonic Valve: The pulmonic valve was grossly normal. Pulmonic valve regurgitation is not visualized. Aorta: The aortic root and ascending aorta are structurally normal, with no evidence of dilitation. IAS/Shunts: The atrial septum is grossly normal.  LEFT VENTRICLE PLAX 2D LVIDd:         4.30 cm  Diastology LVIDs:         2.90 cm  LV e' medial:    5.10 cm/s LV PW:         0.80 cm  LV E/e' medial:  15.0 LV IVS:        0.60 cm  LV e' lateral:   8.40 cm/s LVOT diam:     1.90 cm  LV E/e' lateral: 9.1 LV SV:         62 LV SV Index:   42 LVOT Area:     2.84 cm  RIGHT VENTRICLE RV S prime:     13.20 cm/s TAPSE (M-mode): 1.9 cm LEFT ATRIUM             Index       RIGHT ATRIUM          Index LA diam:        3.10 cm 2.11 cm/m  RA Area:     9.18 cm LA Vol (A2C):   31.0 ml 21.10 ml/m RA Volume:   16.90 ml 11.50 ml/m LA Vol (A4C):   31.5 ml 21.44 ml/m LA Biplane Vol: 31.6 ml 21.50 ml/m  AORTIC VALVE LVOT  Vmax:   124.00 cm/s LVOT Vmean:  82.600 cm/s LVOT VTI:    0.220 m  AORTA Ao Root diam: 2.90 cm Ao Asc diam:  2.90 cm MITRAL VALVE MV Area (PHT): 4.60 cm     SHUNTS MV Decel Time: 165 msec     Systemic VTI:  0.22 m MV E velocity: 76.30 cm/s   Systemic Diam: 1.90 cm MV A velocity: 112.00 cm/s MV E/A ratio:  0.68 Mertie Moores MD Electronically signed by Mertie Moores MD Signature Date/Time: 10/20/2020/4:04:25 PM    Final     I spent 30 minutes in the care of the patient including H and P, review of records, counseling and coordination of care. Reviewed ECHO results,, discussed about continuing Herceptin, Arimidex and repeat imaging in about 3 to 4 months, echo due in about 90 days.  Imaging and echo orders have been placed.  She will continue Herceptin every 3 weeks, Zometa every 3 months, return to clinic for follow-up in about 6 weeks.  All questions were answered. The patient knows to call the clinic with any problems, questions or concerns.    [name], MD 10/21/2020 1:09 PM

## 2020-10-21 NOTE — Assessment & Plan Note (Signed)
This was most likely related to hypoalbuminemia.  This continues to improve, she takes Lasix 40 mg p.o. daily, okay to continue current regimen.  She would like to try half dose of Lasix and see if the lower extremity edema will worsen.  She could try this for about a week and if she starts feeling more short of breath and increased leg swelling, I would recommend continuing the 40 mg daily.

## 2020-10-21 NOTE — Patient Instructions (Signed)
[facility]  Discharge Instructions: Thank you for choosing [facility] to provide your oncology and hematology care.   If you have a lab appointment with the [facility], please go directly to the [facility] and check in at the registration area.   Wear comfortable clothing and clothing appropriate for easy access to any Portacath or PICC line.   We strive to give you quality time with your provider. You may need to reschedule your appointment if you arrive late (15 or more minutes).  Arriving late affects you and other patients whose appointments are after yours.  Also, if you miss three or more appointments without notifying the office, you may be dismissed from the clinic at the provider's discretion.      For prescription refill requests, have your pharmacy contact our office and allow 72 hours for refills to be completed.    Today you received the following chemotherapy and/or immunotherapy agents: Kanjinti   To help prevent nausea and vomiting after your treatment, we encourage you to take your nausea medication as directed.  BELOW ARE SYMPTOMS THAT SHOULD BE REPORTED IMMEDIATELY: *FEVER GREATER THAN 100.4 F (38 C) OR HIGHER *CHILLS OR SWEATING *NAUSEA AND VOMITING THAT IS NOT CONTROLLED WITH YOUR NAUSEA MEDICATION *UNUSUAL SHORTNESS OF BREATH *UNUSUAL BRUISING OR BLEEDING *URINARY PROBLEMS (pain or burning when urinating, or frequent urination) *BOWEL PROBLEMS (unusual diarrhea, constipation, pain near the anus) TENDERNESS IN MOUTH AND THROAT WITH OR WITHOUT PRESENCE OF ULCERS (sore throat, sores in mouth, or a toothache) UNUSUAL RASH, SWELLING OR PAIN  UNUSUAL VAGINAL DISCHARGE OR ITCHING   Items with * indicate a potential emergency and should be followed up as soon as possible or go to the Emergency Department if any problems should occur.  Please show the CHEMOTHERAPY ALERT CARD or IMMUNOTHERAPY ALERT CARD at check-in to the  Emergency Department and triage nurse.  Should you have questions after your visit or need to cancel or reschedule your appointment, please contact [facility]  Dept: [phone]  and follow the prompts.  Office hours are 8:00 a.m. to 4:30 p.m. Monday - Friday. Please note that voicemails left after 4:00 p.m. may not be returned until the following business day.  We are closed weekends and major holidays. You have access to a nurse at all times for urgent questions. Please call the main number to the clinic Dept: [phone] and follow the prompts.   For any non-urgent questions, you may also contact your provider using MyChart. We now offer e-Visits for anyone 33 and older to request care online for non-urgent symptoms. For details visit [email or web address].   Also download the MyChart app! Go to the app store, search "MyChart", open the app, select [facility], and log in with your MyChart username and password.  Due to Covid, a mask is required upon entering the hospital/clinic. If you do not have a mask, one will be given to you upon arrival. For doctor visits, patients may have 1 support person aged 80 or older with them. For treatment visits, patients cannot have anyone with them due to current Covid guidelines and our immunocompromised population.

## 2020-10-21 NOTE — Assessment & Plan Note (Signed)
This is a very pleasant 80 year old female patient with ER positive, PR negative and HER2 amplified breast cancer currently on Arimidex and Herceptin given her age and PS here for FU Since last visit, she has been doing remarkably well.  Clinically, her lower extremity edema and left upper extremity edema have continued to be stable.  No indication for repeat thoracentesis.  Breast appearance again improved to stable. Plan is to continue the above-mentioned regimen, follow-up with [place] every 6 weeks can repeat imaging in 3 to 4 months. She would like to have the Pleurx removed since she has not been using it.  Her last thoracentesis was about 3 months ago.  This is reasonable, IR order has been placed.

## 2020-10-24 DIAGNOSIS — J42 Unspecified chronic bronchitis: Secondary | ICD-10-CM | POA: Diagnosis not present

## 2020-10-24 DIAGNOSIS — C787 Secondary malignant neoplasm of liver and intrahepatic bile duct: Secondary | ICD-10-CM | POA: Diagnosis not present

## 2020-10-24 DIAGNOSIS — R69 Illness, unspecified: Secondary | ICD-10-CM | POA: Diagnosis not present

## 2020-10-24 DIAGNOSIS — M19019 Primary osteoarthritis, unspecified shoulder: Secondary | ICD-10-CM | POA: Diagnosis not present

## 2020-10-24 DIAGNOSIS — C50912 Malignant neoplasm of unspecified site of left female breast: Secondary | ICD-10-CM | POA: Diagnosis not present

## 2020-10-24 DIAGNOSIS — M5431 Sciatica, right side: Secondary | ICD-10-CM | POA: Diagnosis not present

## 2020-10-24 DIAGNOSIS — J910 Malignant pleural effusion: Secondary | ICD-10-CM | POA: Diagnosis not present

## 2020-10-24 DIAGNOSIS — I509 Heart failure, unspecified: Secondary | ICD-10-CM | POA: Diagnosis not present

## 2020-10-24 DIAGNOSIS — C7951 Secondary malignant neoplasm of bone: Secondary | ICD-10-CM | POA: Diagnosis not present

## 2020-10-24 DIAGNOSIS — J449 Chronic obstructive pulmonary disease, unspecified: Secondary | ICD-10-CM | POA: Diagnosis not present

## 2020-10-30 DIAGNOSIS — J42 Unspecified chronic bronchitis: Secondary | ICD-10-CM | POA: Diagnosis not present

## 2020-10-30 DIAGNOSIS — M19019 Primary osteoarthritis, unspecified shoulder: Secondary | ICD-10-CM | POA: Diagnosis not present

## 2020-10-30 DIAGNOSIS — R69 Illness, unspecified: Secondary | ICD-10-CM | POA: Diagnosis not present

## 2020-10-30 DIAGNOSIS — J449 Chronic obstructive pulmonary disease, unspecified: Secondary | ICD-10-CM | POA: Diagnosis not present

## 2020-10-30 DIAGNOSIS — M5431 Sciatica, right side: Secondary | ICD-10-CM | POA: Diagnosis not present

## 2020-10-30 DIAGNOSIS — I509 Heart failure, unspecified: Secondary | ICD-10-CM | POA: Diagnosis not present

## 2020-10-30 DIAGNOSIS — C787 Secondary malignant neoplasm of liver and intrahepatic bile duct: Secondary | ICD-10-CM | POA: Diagnosis not present

## 2020-10-30 DIAGNOSIS — J910 Malignant pleural effusion: Secondary | ICD-10-CM | POA: Diagnosis not present

## 2020-10-30 DIAGNOSIS — C50912 Malignant neoplasm of unspecified site of left female breast: Secondary | ICD-10-CM | POA: Diagnosis not present

## 2020-10-30 DIAGNOSIS — C7951 Secondary malignant neoplasm of bone: Secondary | ICD-10-CM | POA: Diagnosis not present

## 2020-10-31 ENCOUNTER — Other Ambulatory Visit: Payer: Self-pay | Admitting: Hematology and Oncology

## 2020-10-31 ENCOUNTER — Ambulatory Visit (HOSPITAL_COMMUNITY)
Admission: RE | Admit: 2020-10-31 | Discharge: 2020-10-31 | Disposition: A | Discharge status: Home / Self Care | Payer: Medicare HMO | Source: Ambulatory Visit | Attending: Hematology and Oncology | Admitting: Hematology and Oncology

## 2020-10-31 DIAGNOSIS — C7951 Secondary malignant neoplasm of bone: Secondary | ICD-10-CM | POA: Diagnosis not present

## 2020-10-31 DIAGNOSIS — Z452 Encounter for adjustment and management of vascular access device: Secondary | ICD-10-CM | POA: Diagnosis not present

## 2020-10-31 DIAGNOSIS — Z4803 Encounter for change or removal of drains: Secondary | ICD-10-CM | POA: Diagnosis not present

## 2020-10-31 DIAGNOSIS — C50912 Malignant neoplasm of unspecified site of left female breast: Secondary | ICD-10-CM

## 2020-10-31 HISTORY — PX: IR REMOVAL OF PLURAL CATH W/CUFF: IMG5346

## 2020-10-31 MED ORDER — LIDOCAINE HCL 1 % IJ SOLN
INTRAMUSCULAR | Status: AC
  Administered 2020-10-31: 9 mL
  Filled 2020-10-31: qty 20

## 2020-11-04 DIAGNOSIS — C787 Secondary malignant neoplasm of liver and intrahepatic bile duct: Secondary | ICD-10-CM | POA: Diagnosis not present

## 2020-11-04 DIAGNOSIS — M5431 Sciatica, right side: Secondary | ICD-10-CM | POA: Diagnosis not present

## 2020-11-04 DIAGNOSIS — J42 Unspecified chronic bronchitis: Secondary | ICD-10-CM | POA: Diagnosis not present

## 2020-11-04 DIAGNOSIS — J449 Chronic obstructive pulmonary disease, unspecified: Secondary | ICD-10-CM | POA: Diagnosis not present

## 2020-11-04 DIAGNOSIS — R69 Illness, unspecified: Secondary | ICD-10-CM | POA: Diagnosis not present

## 2020-11-04 DIAGNOSIS — M19019 Primary osteoarthritis, unspecified shoulder: Secondary | ICD-10-CM | POA: Diagnosis not present

## 2020-11-04 DIAGNOSIS — I509 Heart failure, unspecified: Secondary | ICD-10-CM | POA: Diagnosis not present

## 2020-11-04 DIAGNOSIS — C7951 Secondary malignant neoplasm of bone: Secondary | ICD-10-CM | POA: Diagnosis not present

## 2020-11-04 DIAGNOSIS — C50912 Malignant neoplasm of unspecified site of left female breast: Secondary | ICD-10-CM | POA: Diagnosis not present

## 2020-11-04 DIAGNOSIS — J910 Malignant pleural effusion: Secondary | ICD-10-CM | POA: Diagnosis not present

## 2020-11-05 DIAGNOSIS — J9601 Acute respiratory failure with hypoxia: Secondary | ICD-10-CM | POA: Diagnosis not present

## 2020-11-05 DIAGNOSIS — C799 Secondary malignant neoplasm of unspecified site: Secondary | ICD-10-CM | POA: Diagnosis not present

## 2020-11-05 DIAGNOSIS — J90 Pleural effusion, not elsewhere classified: Secondary | ICD-10-CM | POA: Diagnosis not present

## 2020-11-11 ENCOUNTER — Inpatient Hospital Stay: Payer: Medicare HMO | Attending: Hematology and Oncology

## 2020-11-11 ENCOUNTER — Inpatient Hospital Stay: Payer: Medicare HMO | Admitting: Hematology and Oncology

## 2020-11-11 ENCOUNTER — Encounter: Payer: Self-pay | Admitting: Hematology and Oncology

## 2020-11-11 ENCOUNTER — Other Ambulatory Visit: Payer: Self-pay

## 2020-11-11 ENCOUNTER — Inpatient Hospital Stay: Payer: Medicare HMO

## 2020-11-11 VITALS — BP 168/72

## 2020-11-11 DIAGNOSIS — Z5112 Encounter for antineoplastic immunotherapy: Secondary | ICD-10-CM | POA: Insufficient documentation

## 2020-11-11 DIAGNOSIS — J910 Malignant pleural effusion: Secondary | ICD-10-CM | POA: Diagnosis not present

## 2020-11-11 DIAGNOSIS — C50912 Malignant neoplasm of unspecified site of left female breast: Secondary | ICD-10-CM

## 2020-11-11 DIAGNOSIS — I10 Essential (primary) hypertension: Secondary | ICD-10-CM

## 2020-11-11 DIAGNOSIS — M19019 Primary osteoarthritis, unspecified shoulder: Secondary | ICD-10-CM | POA: Diagnosis not present

## 2020-11-11 DIAGNOSIS — C7951 Secondary malignant neoplasm of bone: Secondary | ICD-10-CM

## 2020-11-11 DIAGNOSIS — Z803 Family history of malignant neoplasm of breast: Secondary | ICD-10-CM | POA: Insufficient documentation

## 2020-11-11 DIAGNOSIS — R609 Edema, unspecified: Secondary | ICD-10-CM | POA: Diagnosis not present

## 2020-11-11 DIAGNOSIS — C50919 Malignant neoplasm of unspecified site of unspecified female breast: Secondary | ICD-10-CM

## 2020-11-11 DIAGNOSIS — R600 Localized edema: Secondary | ICD-10-CM

## 2020-11-11 DIAGNOSIS — Z170 Estrogen receptor positive status [ER+]: Secondary | ICD-10-CM | POA: Diagnosis not present

## 2020-11-11 DIAGNOSIS — J42 Unspecified chronic bronchitis: Secondary | ICD-10-CM | POA: Diagnosis not present

## 2020-11-11 DIAGNOSIS — M5431 Sciatica, right side: Secondary | ICD-10-CM | POA: Diagnosis not present

## 2020-11-11 DIAGNOSIS — R69 Illness, unspecified: Secondary | ICD-10-CM | POA: Diagnosis not present

## 2020-11-11 DIAGNOSIS — J449 Chronic obstructive pulmonary disease, unspecified: Secondary | ICD-10-CM | POA: Diagnosis not present

## 2020-11-11 DIAGNOSIS — I509 Heart failure, unspecified: Secondary | ICD-10-CM | POA: Diagnosis not present

## 2020-11-11 DIAGNOSIS — F411 Generalized anxiety disorder: Secondary | ICD-10-CM

## 2020-11-11 DIAGNOSIS — Z800 Family history of malignant neoplasm of digestive organs: Secondary | ICD-10-CM | POA: Diagnosis not present

## 2020-11-11 DIAGNOSIS — C787 Secondary malignant neoplasm of liver and intrahepatic bile duct: Secondary | ICD-10-CM | POA: Diagnosis not present

## 2020-11-11 LAB — CBC WITH DIFFERENTIAL/PLATELET
Abs Immature Granulocytes: 0.03 10*3/uL (ref 0.00–0.07)
Basophils Absolute: 0 10*3/uL (ref 0.0–0.1)
Basophils Relative: 1 %
Eosinophils Absolute: 0.1 10*3/uL (ref 0.0–0.5)
Eosinophils Relative: 2 %
HCT: 37.6 % (ref 36.0–46.0)
Hemoglobin: 12 g/dL (ref 12.0–15.0)
Immature Granulocytes: 1 %
Lymphocytes Relative: 24 %
Lymphs Abs: 1.2 10*3/uL (ref 0.7–4.0)
MCH: 26.5 pg (ref 26.0–34.0)
MCHC: 31.9 g/dL (ref 30.0–36.0)
MCV: 83.2 fL (ref 80.0–100.0)
Monocytes Absolute: 0.8 10*3/uL (ref 0.1–1.0)
Monocytes Relative: 16 %
Neutro Abs: 2.8 10*3/uL (ref 1.7–7.7)
Neutrophils Relative %: 56 %
Platelets: 181 10*3/uL (ref 150–400)
RBC: 4.52 MIL/uL (ref 3.87–5.11)
RDW: 15.3 % (ref 11.5–15.5)
WBC: 4.9 10*3/uL (ref 4.0–10.5)
nRBC: 0 % (ref 0.0–0.2)

## 2020-11-11 LAB — BASIC METABOLIC PANEL - CANCER CENTER ONLY
Anion gap: 11 (ref 5–15)
BUN: 11 mg/dL (ref 8–23)
CO2: 25 mmol/L (ref 22–32)
Calcium: 9.3 mg/dL (ref 8.9–10.3)
Chloride: 105 mmol/L (ref 98–111)
Creatinine: 0.84 mg/dL (ref 0.44–1.00)
GFR, Estimated: >60 mL/min (ref >60)
Glucose, Bld: 200 mg/dL — ABNORMAL HIGH (ref 70–99)
Potassium: 3.8 mmol/L (ref 3.5–5.1)
Sodium: 141 mmol/L (ref 135–145)

## 2020-11-11 MED ORDER — SODIUM CHLORIDE 0.9 % IV SOLN: Freq: Once | INTRAVENOUS | Status: DC

## 2020-11-11 MED ORDER — TRASTUZUMAB-ANNS CHEMO 150 MG IV SOLR
300.0000 mg | Freq: Once | INTRAVENOUS | Status: AC
  Administered 2020-11-11: 300 mg via INTRAVENOUS
  Filled 2020-11-11: qty 14.29

## 2020-11-11 NOTE — Progress Notes (Signed)
[name] FOLLOW UP NOTE  Patient Care Team: Patient, [name] as PCP - General (General Practice) [name], MD as Consulting Physician (Hematology and Oncology)  CHIEF COMPLAINTS/PURPOSE OF CONSULTATION:   FU on metastatic breast cancer, recently hospitalized, planned infusion and visit today after discharge.  ASSESSMENT & PLAN:   Carcinoma of breast metastatic to bone [facility]) This is a very pleasant 80 year old female patient with ER positive, PR negative and HER2 amplified breast cancer currently on Arimidex and Herceptin given her age and PS here for FU Since last visit, she has been doing remarkably well.  Clinically, her lower extremity edema and left upper extremity edema have continued to be stable.  She had her Pleurx removed since last visit.  Breast exam continues to remain stable.  At this time plan is to continue Herceptin every 21 days follow-up with [place] every 6 weeks and Zometa every 3 months.  Echo due in December 2022.  Bilateral lower extremity edema Continue Lasix as prescribed.  No concerns today on exam.  HTN (hypertension) Uncontrolled high blood pressure today.  Patient attributes this to a lot of stress.  She did not have any symptoms concerning for hypertensive urgency or emergency. I recommended that she continue follow-up with PCP if this persists.  She tells me at home her blood pressure is much better controlled.   No orders of the defined types were placed in this encounter.   HISTORY OF PRESENTING ILLNESS:   Morgan Yu 80 y.o. female is here because of newly diagnosed metastatic breast cancer.  Oncology History  Carcinoma of breast metastatic to bone ([place])  03/19/2020 Initial Diagnosis   Carcinoma of breast metastatic to bone ([place])   03/24/2020 -  Chemotherapy   Patient is on Treatment Plan : BREAST Trastuzumab q21d     04/15/2020 Cancer Staging   Staging form: Breast, AJCC 8th Edition - Clinical stage from  04/15/2020: Stage IV (cTX, cNX, cM1, GX, ER+, PR-, HER2+) - Signed by [name], MD on 04/15/2020 Stage prefix: Initial diagnosis Histologic grading system: 3 grade system    Interim History  [name] is here for follow-up. She continues to do very well.  Since last visit, she had her Pleurx catheter removed and has been doing well.  No worsening shortness of breath.  No new bone pains.  Left breast continues to feel about the same but softer.  Left upper extremity edema is stable.  Bilateral lower extremity edema well controlled on Lasix.  She was really hypertensive on today's visit.  She was asymptomatic and no headaches, chest pain or chest pressure, blurred vision, syncope.  She mentions that she has been handling all her affairs recently and has been going through some stress.  Rest of the pertinent 10 point ROS reviewed and negative.  MEDICAL HISTORY:  Past Medical History:  Diagnosis Date   Breast cancer [facility])    with bone metastasis    SURGICAL HISTORY: Past Surgical History:  Procedure Laterality Date   IR PERC PLEURAL DRAIN W/INDWELL CATH W/IMG GUIDE  06/03/2020   IR RADIOLOGIST EVAL & MGMT  07/31/2020   IR REMOVAL OF PLURAL CATH W/CUFF  10/31/2020    SOCIAL HISTORY: Social History   Socioeconomic History   Marital status: Widowed    Spouse name: Not on file   Number of children: Not on file   Years of education: Not on file   Highest education level: Not on file  Occupational History  Not on file  Tobacco Use   Smoking status: Never   Smokeless tobacco: Never  Vaping Use   Vaping Use: Never used  Substance and Sexual Activity   Alcohol use: Not Currently   Drug use: Never   Sexual activity: Not on file  Other Topics Concern   Not on file  Social History Narrative   No biological children   Has 2 stepchildren   Social Determinants of Health   Financial Resource Strain: Not on file  Food Insecurity: Not on file  Transportation Needs: Not on file   Physical Activity: Not on file  Stress: Not on file  Social Connections: Not on file  Intimate Partner Violence: Not on file    FAMILY HISTORY: Family History  Problem Relation Age of Onset   Stroke Mother    Heart attack Father    Cancer Brother    Esophageal cancer Maternal Aunt    Breast cancer Maternal Aunt     ALLERGIES:  is allergic to vicodin hp [hydrocodone-acetaminophen].  MEDICATIONS:  Current Outpatient Medications  Medication Sig Dispense Refill   anastrozole (ARIMIDEX) 1 MG tablet Take 1 tablet (1 mg total) by mouth daily. 30 tablet 11   furosemide (LASIX) 40 MG tablet TAKE 1 TABLET BY MOUTH EVERY DAY 30 tablet 0   acetaminophen (TYLENOL) 500 MG tablet Take 500 mg by mouth every 6 (six) hours as needed for moderate pain. (Patient not taking: No sig reported)     benzonatate (TESSALON) 100 MG capsule Take 1 capsule (100 mg total) by mouth 3 (three) times daily. (Patient not taking: No sig reported) 20 capsule 0   Chlorphen-Pseudoephed-APAP (TYLENOL ALLERGY SINUS PO) Take 1 tablet by mouth daily as needed (allergy). (Patient not taking: No sig reported)     chlorpheniramine-HYDROcodone (TUSSIONEX) 10-8 MG/5ML SUER Take 5 mLs by mouth every 6 (six) hours as needed for cough (If unresponsive to Robitussin DM). (Patient not taking: No sig reported) 140 mL 0   feeding supplement (ENSURE ENLIVE / ENSURE PLUS) LIQD Take 237 mLs by mouth 3 (three) times daily between meals. (Patient not taking: No sig reported) 237 mL 12   guaiFENesin (MUCINEX) 600 MG 12 hr tablet Take 1 tablet (600 mg total) by mouth 2 (two) times daily. (Patient not taking: Reported on 11/11/2020) 60 tablet 0   LORazepam (ATIVAN) 0.5 MG tablet Take 1 tablet (0.5 mg total) by mouth every 8 (eight) hours as needed for anxiety. Take 1 hour prior to radiation therapy (Patient not taking: Reported on 11/11/2020) 30 tablet 0   ondansetron (ZOFRAN) 4 MG tablet Take 1 tablet (4 mg total) by mouth every 8 (eight) hours  as needed for nausea or vomiting. (Patient not taking: No sig reported) 40 tablet 1   oxyCODONE (OXY IR/ROXICODONE) 5 MG immediate release tablet Take 0.5-1 tablets (2.5-5 mg total) by mouth every 4 (four) hours as needed for severe pain (dyspnea). (Patient not taking: No sig reported) 15 tablet 0   oxymetazoline (AFRIN) 0.05 % nasal spray Place 1 spray into both nostrils daily as needed for congestion. (Patient not taking: No sig reported)     senna (SENOKOT) 8.6 MG TABS tablet Take 8.6 mg by mouth daily as needed for mild constipation. (Patient not taking: No sig reported)     No current facility-administered medications for this visit.   Facility-Administered Medications Ordered in Other Visits  Medication Dose Route Frequency Provider Last Rate Last Admin   0.9 %  sodium chloride infusion  Intravenous Once Zackory Pudlo, Arletha Pili, MD        PHYSICAL EXAMINATION:  ECOG PERFORMANCE STATUS: 0 - Asymptomatic  Vitals:   11/11/20 1209  BP: (!) 194/91  Pulse: 89  Resp: 17  Temp: 98.3 F (36.8 C)  SpO2: 98%   Filed Weights   11/11/20 1209  Weight: 119 lb 4.8 oz (54.1 kg)   Physical Exam Constitutional:      General: She is not in acute distress.    Appearance: She is not ill-appearing.  HENT:     Head: Normocephalic and atraumatic.  Cardiovascular:     Rate and Rhythm: Normal rate and regular rhythm.     Pulses: Normal pulses.     Heart sounds: Normal heart sounds.  Pulmonary:     Effort: Pulmonary effort is normal. No respiratory distress.     Breath sounds: Normal breath sounds. No rales.  Chest:  Breasts:    Left: Mass and skin change present.     Comments: Breast exam, left breast with skin changes stable since last exam Left axillary LN continues to be stable and significantly improved compared to time of initial diagnosis.  Abdominal:     General: Abdomen is flat.     Palpations: Abdomen is soft.  Musculoskeletal:        General: Swelling (BLE 1 + edema, continues on  lasix) present. Normal range of motion.     Cervical back: Normal range of motion and neck supple.     Comments: LUE edema has compression sleeve Stable since last visit  Lymphadenopathy:     Cervical: No cervical adenopathy.  Skin:    General: Skin is warm and dry.  Neurological:     General: No focal deficit present.     Mental Status: She is alert.  Psychiatric:        Mood and Affect: Mood normal.        Behavior: Behavior normal.     LABORATORY DATA:  I have reviewed the data as listed Lab Results  Component Value Date   WBC 4.9 11/11/2020   HGB 12.0 11/11/2020   HCT 37.6 11/11/2020   MCV 83.2 11/11/2020   PLT 181 11/11/2020     Chemistry      Component Value Date/Time   NA 141 11/11/2020 1141   K 3.8 11/11/2020 1141   CL 105 11/11/2020 1141   CO2 25 11/11/2020 1141   BUN 11 11/11/2020 1141   CREATININE 0.84 11/11/2020 1141      Component Value Date/Time   CALCIUM 9.3 11/11/2020 1141   ALKPHOS 183 (H) 08/18/2020 0859   AST 27 08/18/2020 0859   ALT 13 08/18/2020 0859   BILITOT 0.4 08/18/2020 0859      Labs from today with white blood cell count of 10,000, hemoglobin of 11.7 and platelets 190,000. Last zometa on 09/30/2020  RADIOGRAPHIC STUDIES: I have personally reviewed the radiological images as listed and agreed with the findings in the report. ECHOCARDIOGRAM COMPLETE  Result Date: 10/20/2020    ECHOCARDIOGRAM REPORT   Patient Name:   Morgan Yu Date of Exam: 10/20/2020 Medical Rec #:  [id number]      Height:       61.0 in Accession #:    [id number]     Weight:       110.8 lb Date of Birth:  [birth year]       BSA:          1.469 m Patient Age:    60  years       BP:           146/71 mmHg Patient Gender: F              HR:           92 bpm. Exam Location:  Outpatient Procedure: 2D Echo, Cardiac Doppler, Color Doppler and Strain Analysis Indications:    Chemo Z09  History:        Patient has prior history of Echocardiogram examinations, most                  recent 05/28/2020.  Sonographer:    Bernadene Person RDCS Referring Phys: [id number] [place]  1. Left ventricular ejection fraction, by estimation, is 60 to 65%. The left ventricle has normal function. The left ventricle has no regional wall motion abnormalities. Left ventricular diastolic parameters were normal.  2. Right ventricular systolic function is normal. The right ventricular size is normal.  3. The mitral valve is normal in structure. Trivial mitral valve regurgitation.  4. The aortic valve is normal in structure. Aortic valve regurgitation is not visualized. FINDINGS  Left Ventricle: Left ventricular ejection fraction, by estimation, is 60 to 65%. The left ventricle has normal function. The left ventricle has no regional wall motion abnormalities. The left ventricular internal cavity size was normal in size. There is  no left ventricular hypertrophy. Left ventricular diastolic parameters were normal. Right Ventricle: The right ventricular size is normal. Right vetricular wall thickness was not well visualized. Right ventricular systolic function is normal. Left Atrium: Left atrial size was normal in size. Right Atrium: Right atrial size was normal in size. Pericardium: There is no evidence of pericardial effusion. Mitral Valve: The mitral valve is normal in structure. Trivial mitral valve regurgitation. Tricuspid Valve: The tricuspid valve is normal in structure. Tricuspid valve regurgitation is trivial. Aortic Valve: The aortic valve is normal in structure. Aortic valve regurgitation is not visualized. Pulmonic Valve: The pulmonic valve was grossly normal. Pulmonic valve regurgitation is not visualized. Aorta: The aortic root and ascending aorta are structurally normal, with no evidence of dilitation. IAS/Shunts: The atrial septum is grossly normal.  LEFT VENTRICLE PLAX 2D LVIDd:         4.30 cm  Diastology LVIDs:         2.90 cm  LV e' medial:    5.10 cm/s LV PW:         0.80 cm  LV E/e'  medial:  15.0 LV IVS:        0.60 cm  LV e' lateral:   8.40 cm/s LVOT diam:     1.90 cm  LV E/e' lateral: 9.1 LV SV:         62 LV SV Index:   42 LVOT Area:     2.84 cm  RIGHT VENTRICLE RV S prime:     13.20 cm/s TAPSE (M-mode): 1.9 cm LEFT ATRIUM             Index       RIGHT ATRIUM          Index LA diam:        3.10 cm 2.11 cm/m  RA Area:     9.18 cm LA Vol (A2C):   31.0 ml 21.10 ml/m RA Volume:   16.90 ml 11.50 ml/m LA Vol (A4C):   31.5 ml 21.44 ml/m LA Biplane Vol: 31.6 ml 21.50 ml/m  AORTIC VALVE LVOT Vmax:   124.00 cm/s LVOT  Vmean:  82.600 cm/s LVOT VTI:    0.220 m  AORTA Ao Root diam: 2.90 cm Ao Asc diam:  2.90 cm MITRAL VALVE MV Area (PHT): 4.60 cm     SHUNTS MV Decel Time: 165 msec     Systemic VTI:  0.22 m MV E velocity: 76.30 cm/s   Systemic Diam: 1.90 cm MV A velocity: 112.00 cm/s MV E/A ratio:  0.68 Mertie Moores MD Electronically signed by Mertie Moores MD Signature Date/Time: 10/20/2020/4:04:25 PM    Final    IR Removal Of Plural Cath W/Cuff  Result Date: 10/31/2020 INDICATION: No longer using PleurX catheter, has not used in over 3 months EXAM: REMOVAL TUNNELED CENTRAL VENOUS CATHETER MEDICATIONS: None ANESTHESIA/SEDATION: Local analgesia FLUOROSCOPY TIME:  None COMPLICATIONS: None immediate. PROCEDURE: Informed written consent was obtained from the patient after a thorough discussion of the procedural risks, benefits and alternatives. All questions were addressed. Maximal Sterile Barrier Technique was utilized including caps, mask, sterile gowns, sterile gloves, sterile drape, hand hygiene and skin antiseptic. A timeout was performed prior to the initiation of the procedure. The patient's left chest and catheter was prepped and draped in a normal sterile fashion. Attempts to drain fluid using the left PleurX catheter yielded no pleural fluid. 1% lidocaine was used for local anesthesia. Using gentle blunt dissection the cuff of the catheter was exposed and the catheter was removed in it's  entirety. Pressure was held till hemostasis was obtained. A sterile dressing was applied. The patient tolerated the procedure well with no immediate complications. IMPRESSION: Successful removal of left PleurX catheter. Electronically Signed   By: Albin Felling M.D.   On: 10/31/2020 17:22    I spent 30 minutes in the care of the patient including H and P, review of records, counseling and coordination of care. She continues to do well on Herceptin.  We will continue Herceptin every 21 days and Zometa every 3 months.  Clinically she has remained stable.  Will consider imaging every 4 to 6 months. All questions were answered. The patient knows to call the clinic with any problems, questions or concerns.    [name], MD 11/11/2020 4:54 PM

## 2020-11-11 NOTE — Assessment & Plan Note (Signed)
This is a very pleasant 80 year old female patient with ER positive, PR negative and HER2 amplified breast cancer currently on Arimidex and Herceptin given her age and PS here for FU Since last visit, she has been doing remarkably well.  Clinically, her lower extremity edema and left upper extremity edema have continued to be stable.  She had her Pleurx removed since last visit.  Breast exam continues to remain stable.  At this time plan is to continue Herceptin every 21 days follow-up with [place] every 6 weeks and Zometa every 3 months.  Echo due in December 2022.

## 2020-11-11 NOTE — Patient Instructions (Signed)
[facility]  Discharge Instructions: Thank you for choosing [facility] to provide your oncology and hematology care.   If you have a lab appointment with the [facility], please go directly to the [facility] and check in at the registration area.   Wear comfortable clothing and clothing appropriate for easy access to any Portacath or PICC line.   We strive to give you quality time with your provider. You may need to reschedule your appointment if you arrive late (15 or more minutes).  Arriving late affects you and other patients whose appointments are after yours.  Also, if you miss three or more appointments without notifying the office, you may be dismissed from the clinic at the provider's discretion.      For prescription refill requests, have your pharmacy contact our office and allow 72 hours for refills to be completed.    Today you received the following chemotherapy and/or immunotherapy agents Trastuzumab-anns ([place])      To help prevent nausea and vomiting after your treatment, we encourage you to take your nausea medication as directed.  BELOW ARE SYMPTOMS THAT SHOULD BE REPORTED IMMEDIATELY: *FEVER GREATER THAN 100.4 F (38 C) OR HIGHER *CHILLS OR SWEATING *NAUSEA AND VOMITING THAT IS NOT CONTROLLED WITH YOUR NAUSEA MEDICATION *UNUSUAL SHORTNESS OF BREATH *UNUSUAL BRUISING OR BLEEDING *URINARY PROBLEMS (pain or burning when urinating, or frequent urination) *BOWEL PROBLEMS (unusual diarrhea, constipation, pain near the anus) TENDERNESS IN MOUTH AND THROAT WITH OR WITHOUT PRESENCE OF ULCERS (sore throat, sores in mouth, or a toothache) UNUSUAL RASH, SWELLING OR PAIN  UNUSUAL VAGINAL DISCHARGE OR ITCHING   Items with * indicate a potential emergency and should be followed up as soon as possible or go to the Emergency Department if any problems should occur.  Please show the CHEMOTHERAPY ALERT CARD or IMMUNOTHERAPY ALERT  CARD at check-in to the Emergency Department and triage nurse.  Should you have questions after your visit or need to cancel or reschedule your appointment, please contact [facility]  Dept: [phone]  and follow the prompts.  Office hours are 8:00 a.m. to 4:30 p.m. Monday - Friday. Please note that voicemails left after 4:00 p.m. may not be returned until the following business day.  We are closed weekends and major holidays. You have access to a nurse at all times for urgent questions. Please call the main number to the clinic Dept: [phone] and follow the prompts.   For any non-urgent questions, you may also contact your provider using MyChart. We now offer e-Visits for anyone 39 and older to request care online for non-urgent symptoms. For details visit [email or web address].   Also download the MyChart app! Go to the app store, search "MyChart", open the app, select [facility], and log in with your MyChart username and password.  Due to Covid, a mask is required upon entering the hospital/clinic. If you do not have a mask, one will be given to you upon arrival. For doctor visits, patients may have 1 support person aged 59 or older with them. For treatment visits, patients cannot have anyone with them due to current Covid guidelines and our immunocompromised population.

## 2020-11-11 NOTE — Assessment & Plan Note (Signed)
Continue Lasix as prescribed.  No concerns today on exam.

## 2020-11-11 NOTE — Assessment & Plan Note (Signed)
Uncontrolled high blood pressure today.  Patient attributes this to a lot of stress.  She did not have any symptoms concerning for hypertensive urgency or emergency. I recommended that she continue follow-up with PCP if this persists.  She tells me at home her blood pressure is much better controlled.

## 2020-11-12 ENCOUNTER — Ambulatory Visit: Payer: Medicare HMO | Admitting: Hematology and Oncology

## 2020-11-12 ENCOUNTER — Ambulatory Visit: Payer: Medicare HMO

## 2020-11-12 ENCOUNTER — Other Ambulatory Visit: Payer: Medicare HMO

## 2020-11-14 DIAGNOSIS — M19019 Primary osteoarthritis, unspecified shoulder: Secondary | ICD-10-CM | POA: Diagnosis not present

## 2020-11-14 DIAGNOSIS — J449 Chronic obstructive pulmonary disease, unspecified: Secondary | ICD-10-CM | POA: Diagnosis not present

## 2020-11-14 DIAGNOSIS — I509 Heart failure, unspecified: Secondary | ICD-10-CM | POA: Diagnosis not present

## 2020-11-14 DIAGNOSIS — M5431 Sciatica, right side: Secondary | ICD-10-CM | POA: Diagnosis not present

## 2020-11-14 DIAGNOSIS — C50912 Malignant neoplasm of unspecified site of left female breast: Secondary | ICD-10-CM | POA: Diagnosis not present

## 2020-11-14 DIAGNOSIS — J910 Malignant pleural effusion: Secondary | ICD-10-CM | POA: Diagnosis not present

## 2020-11-14 DIAGNOSIS — C787 Secondary malignant neoplasm of liver and intrahepatic bile duct: Secondary | ICD-10-CM | POA: Diagnosis not present

## 2020-11-14 DIAGNOSIS — R69 Illness, unspecified: Secondary | ICD-10-CM | POA: Diagnosis not present

## 2020-11-14 DIAGNOSIS — C7951 Secondary malignant neoplasm of bone: Secondary | ICD-10-CM | POA: Diagnosis not present

## 2020-11-14 DIAGNOSIS — J42 Unspecified chronic bronchitis: Secondary | ICD-10-CM | POA: Diagnosis not present

## 2020-11-17 ENCOUNTER — Other Ambulatory Visit: Payer: Self-pay | Admitting: Hematology and Oncology

## 2020-11-18 DIAGNOSIS — M5431 Sciatica, right side: Secondary | ICD-10-CM | POA: Diagnosis not present

## 2020-11-18 DIAGNOSIS — J42 Unspecified chronic bronchitis: Secondary | ICD-10-CM | POA: Diagnosis not present

## 2020-11-18 DIAGNOSIS — C50912 Malignant neoplasm of unspecified site of left female breast: Secondary | ICD-10-CM | POA: Diagnosis not present

## 2020-11-18 DIAGNOSIS — R69 Illness, unspecified: Secondary | ICD-10-CM | POA: Diagnosis not present

## 2020-11-18 DIAGNOSIS — C7951 Secondary malignant neoplasm of bone: Secondary | ICD-10-CM | POA: Diagnosis not present

## 2020-11-18 DIAGNOSIS — M19019 Primary osteoarthritis, unspecified shoulder: Secondary | ICD-10-CM | POA: Diagnosis not present

## 2020-11-18 DIAGNOSIS — J449 Chronic obstructive pulmonary disease, unspecified: Secondary | ICD-10-CM | POA: Diagnosis not present

## 2020-11-18 DIAGNOSIS — C787 Secondary malignant neoplasm of liver and intrahepatic bile duct: Secondary | ICD-10-CM | POA: Diagnosis not present

## 2020-11-18 DIAGNOSIS — I509 Heart failure, unspecified: Secondary | ICD-10-CM | POA: Diagnosis not present

## 2020-11-18 DIAGNOSIS — J910 Malignant pleural effusion: Secondary | ICD-10-CM | POA: Diagnosis not present

## 2020-11-21 DIAGNOSIS — C50912 Malignant neoplasm of unspecified site of left female breast: Secondary | ICD-10-CM | POA: Diagnosis not present

## 2020-11-21 DIAGNOSIS — J910 Malignant pleural effusion: Secondary | ICD-10-CM | POA: Diagnosis not present

## 2020-11-21 DIAGNOSIS — C787 Secondary malignant neoplasm of liver and intrahepatic bile duct: Secondary | ICD-10-CM | POA: Diagnosis not present

## 2020-11-21 DIAGNOSIS — R69 Illness, unspecified: Secondary | ICD-10-CM | POA: Diagnosis not present

## 2020-11-21 DIAGNOSIS — M5431 Sciatica, right side: Secondary | ICD-10-CM | POA: Diagnosis not present

## 2020-11-21 DIAGNOSIS — I509 Heart failure, unspecified: Secondary | ICD-10-CM | POA: Diagnosis not present

## 2020-11-21 DIAGNOSIS — M19019 Primary osteoarthritis, unspecified shoulder: Secondary | ICD-10-CM | POA: Diagnosis not present

## 2020-11-21 DIAGNOSIS — J42 Unspecified chronic bronchitis: Secondary | ICD-10-CM | POA: Diagnosis not present

## 2020-11-21 DIAGNOSIS — J449 Chronic obstructive pulmonary disease, unspecified: Secondary | ICD-10-CM | POA: Diagnosis not present

## 2020-11-21 DIAGNOSIS — C7951 Secondary malignant neoplasm of bone: Secondary | ICD-10-CM | POA: Diagnosis not present

## 2020-11-24 ENCOUNTER — Other Ambulatory Visit: Payer: Self-pay | Admitting: *Deleted

## 2020-12-03 ENCOUNTER — Ambulatory Visit: Payer: Medicare HMO

## 2020-12-03 ENCOUNTER — Other Ambulatory Visit: Payer: Self-pay | Admitting: Hematology

## 2020-12-03 ENCOUNTER — Other Ambulatory Visit: Payer: Self-pay

## 2020-12-03 ENCOUNTER — Other Ambulatory Visit: Payer: Medicare HMO

## 2020-12-03 ENCOUNTER — Inpatient Hospital Stay (HOSPITAL_BASED_OUTPATIENT_CLINIC_OR_DEPARTMENT_OTHER): Payer: Medicare HMO | Admitting: Nurse Practitioner

## 2020-12-03 ENCOUNTER — Inpatient Hospital Stay: Payer: Medicare HMO

## 2020-12-03 ENCOUNTER — Encounter: Payer: Self-pay | Admitting: Nurse Practitioner

## 2020-12-03 ENCOUNTER — Inpatient Hospital Stay: Payer: Medicare HMO | Attending: Hematology and Oncology

## 2020-12-03 VITALS — BP 188/84 | HR 89

## 2020-12-03 VITALS — BP 193/97 | HR 98 | Temp 98.4°F | Resp 18 | Ht 61.0 in | Wt 119.7 lb

## 2020-12-03 DIAGNOSIS — C50919 Malignant neoplasm of unspecified site of unspecified female breast: Secondary | ICD-10-CM | POA: Diagnosis not present

## 2020-12-03 DIAGNOSIS — Z5112 Encounter for antineoplastic immunotherapy: Secondary | ICD-10-CM | POA: Diagnosis not present

## 2020-12-03 DIAGNOSIS — R5383 Other fatigue: Secondary | ICD-10-CM | POA: Insufficient documentation

## 2020-12-03 DIAGNOSIS — Z170 Estrogen receptor positive status [ER+]: Secondary | ICD-10-CM | POA: Diagnosis not present

## 2020-12-03 DIAGNOSIS — C7951 Secondary malignant neoplasm of bone: Secondary | ICD-10-CM | POA: Insufficient documentation

## 2020-12-03 DIAGNOSIS — C50912 Malignant neoplasm of unspecified site of left female breast: Secondary | ICD-10-CM | POA: Diagnosis not present

## 2020-12-03 DIAGNOSIS — I890 Lymphedema, not elsewhere classified: Secondary | ICD-10-CM | POA: Diagnosis not present

## 2020-12-03 DIAGNOSIS — F411 Generalized anxiety disorder: Secondary | ICD-10-CM

## 2020-12-03 DIAGNOSIS — Z79811 Long term (current) use of aromatase inhibitors: Secondary | ICD-10-CM | POA: Insufficient documentation

## 2020-12-03 DIAGNOSIS — Z923 Personal history of irradiation: Secondary | ICD-10-CM | POA: Diagnosis not present

## 2020-12-03 LAB — CBC WITH DIFFERENTIAL/PLATELET
Abs Immature Granulocytes: 0.01 10*3/uL (ref 0.00–0.07)
Basophils Absolute: 0 10*3/uL (ref 0.0–0.1)
Basophils Relative: 0 %
Eosinophils Absolute: 0.1 10*3/uL (ref 0.0–0.5)
Eosinophils Relative: 2 %
HCT: 37.6 % (ref 36.0–46.0)
Hemoglobin: 12.6 g/dL (ref 12.0–15.0)
Immature Granulocytes: 0 %
Lymphocytes Relative: 19 %
Lymphs Abs: 1.2 10*3/uL (ref 0.7–4.0)
MCH: 27.2 pg (ref 26.0–34.0)
MCHC: 33.5 g/dL (ref 30.0–36.0)
MCV: 81 fL (ref 80.0–100.0)
Monocytes Absolute: 0.9 10*3/uL (ref 0.1–1.0)
Monocytes Relative: 14 %
Neutro Abs: 4 10*3/uL (ref 1.7–7.7)
Neutrophils Relative %: 65 %
Platelets: 190 10*3/uL (ref 150–400)
RBC: 4.64 MIL/uL (ref 3.87–5.11)
RDW: 15.2 % (ref 11.5–15.5)
WBC: 6.2 10*3/uL (ref 4.0–10.5)
nRBC: 0 % (ref 0.0–0.2)

## 2020-12-03 LAB — BASIC METABOLIC PANEL - CANCER CENTER ONLY
Anion gap: 12 (ref 5–15)
BUN: 16 mg/dL (ref 8–23)
CO2: 25 mmol/L (ref 22–32)
Calcium: 9.7 mg/dL (ref 8.9–10.3)
Chloride: 106 mmol/L (ref 98–111)
Creatinine: 0.83 mg/dL (ref 0.44–1.00)
GFR, Estimated: >60 mL/min (ref >60)
Glucose, Bld: 133 mg/dL — ABNORMAL HIGH (ref 70–99)
Potassium: 3.9 mmol/L (ref 3.5–5.1)
Sodium: 143 mmol/L (ref 135–145)

## 2020-12-03 MED ORDER — SODIUM CHLORIDE 0.9 % IV SOLN: Freq: Once | INTRAVENOUS | Status: AC

## 2020-12-03 MED ORDER — TRASTUZUMAB-ANNS CHEMO 150 MG IV SOLR
300.0000 mg | Freq: Once | INTRAVENOUS | Status: AC
  Administered 2020-12-03: 300 mg via INTRAVENOUS
  Filled 2020-12-03: qty 14.29

## 2020-12-03 MED ORDER — FUROSEMIDE 20 MG PO TABS: 20.0000 mg | ORAL_TABLET | Freq: Every day | ORAL | 0 refills | Status: DC

## 2020-12-03 NOTE — Progress Notes (Signed)
Per Cira Rue, NP okay to treat with elevated BP.

## 2020-12-03 NOTE — Progress Notes (Signed)
[facility]   Telephone:([phone] Fax:(336) [phone]   Clinic Follow up Note   Patient Care Team: Patient, [name] as PCP - General ([place]) [name], MD as Consulting Physician (Hematology and Oncology) 12/03/2020  CHIEF COMPLAINT: Follow-up metastatic breast cancer  SUMMARY OF ONCOLOGIC HISTORY: Oncology History  Bone metastases ([place])  02/27/2020 Initial Diagnosis   Bone metastases ([place])   Carcinoma of breast metastatic to bone ([place])  03/19/2020 Initial Diagnosis   Carcinoma of breast metastatic to bone ([place])   03/24/2020 -  Chemotherapy   Patient is on Treatment Plan : BREAST Trastuzumab q21d     04/15/2020 Cancer Staging   Staging form: Breast, AJCC 8th Edition - Clinical stage from 04/15/2020: Stage IV (cTX, cNX, cM1, GX, ER+, PR-, HER2+) - Signed by [name], MD on 04/15/2020 Stage prefix: Initial diagnosis Histologic grading system: 3 grade system      CURRENT THERAPY:  Anastrozole 1 mg p.o. daily Herceptin/Kanjinti q3 weeks Zometa q3 months, last given 09/30/2020  INTERVAL HISTORY: Morgan Yu returns for follow-up and treatment as scheduled.  Last seen by Dr. Chryl Heck 11/11/2020 and completed another cycle of Herceptin.  She is doing very well overall, she has mild fatigue 1-2 days after Herceptin, she is able to remain active with some rest then recovers well.  Appetite is normal.  She continues anastrozole, denies bone or joint pain, hot flashes, or mood fluctuations.  Her friends note she is back to her old self.  Denies recurrent cough or dyspnea since Pleurx was removed.  Her BP is elevated, she attributes to driving over here and being alone for the appointments which is stressful.  Denies headache or dizziness.  Denies fever, chills, nausea/vomiting, constipation, diarrhea, pain, or any other new concerns.    MEDICAL HISTORY:  Past Medical History:  Diagnosis Date   Breast cancer [facility])    with bone metastasis     SURGICAL HISTORY: Past Surgical History:  Procedure Laterality Date   IR PERC PLEURAL DRAIN W/INDWELL CATH W/IMG GUIDE  06/03/2020   IR RADIOLOGIST EVAL & MGMT  07/31/2020   IR REMOVAL OF PLURAL CATH W/CUFF  10/31/2020    I have reviewed the social history and family history with the patient and they are unchanged from previous note.  ALLERGIES:  is allergic to vicodin hp [hydrocodone-acetaminophen].  MEDICATIONS:  Current Outpatient Medications  Medication Sig Dispense Refill   acetaminophen (TYLENOL) 500 MG tablet Take 500 mg by mouth every 6 (six) hours as needed for moderate pain. (Patient not taking: No sig reported)     anastrozole (ARIMIDEX) 1 MG tablet Take 1 tablet (1 mg total) by mouth daily. 30 tablet 11   benzonatate (TESSALON) 100 MG capsule Take 1 capsule (100 mg total) by mouth 3 (three) times daily. (Patient not taking: No sig reported) 20 capsule 0   Chlorphen-Pseudoephed-APAP (TYLENOL ALLERGY SINUS PO) Take 1 tablet by mouth daily as needed (allergy). (Patient not taking: No sig reported)     chlorpheniramine-HYDROcodone (TUSSIONEX) 10-8 MG/5ML SUER Take 5 mLs by mouth every 6 (six) hours as needed for cough (If unresponsive to Robitussin DM). (Patient not taking: No sig reported) 140 mL 0   feeding supplement (ENSURE ENLIVE / ENSURE PLUS) LIQD Take 237 mLs by mouth 3 (three) times daily between meals. (Patient not taking: No sig reported) 237 mL 12   furosemide (LASIX) 20 MG tablet Take 1 tablet (20 mg total) by mouth daily. 30 tablet 0  guaiFENesin (MUCINEX) 600 MG 12 hr tablet Take 1 tablet (600 mg total) by mouth 2 (two) times daily. (Patient not taking: Reported on 11/11/2020) 60 tablet 0   LORazepam (ATIVAN) 0.5 MG tablet Take 1 tablet (0.5 mg total) by mouth every 8 (eight) hours as needed for anxiety. Take 1 hour prior to radiation therapy (Patient not taking: Reported on 11/11/2020) 30 tablet 0   ondansetron (ZOFRAN) 4 MG tablet Take 1 tablet (4 mg total) by  mouth every 8 (eight) hours as needed for nausea or vomiting. (Patient not taking: No sig reported) 40 tablet 1   oxyCODONE (OXY IR/ROXICODONE) 5 MG immediate release tablet Take 0.5-1 tablets (2.5-5 mg total) by mouth every 4 (four) hours as needed for severe pain (dyspnea). (Patient not taking: No sig reported) 15 tablet 0   oxymetazoline (AFRIN) 0.05 % nasal spray Place 1 spray into both nostrils daily as needed for congestion. (Patient not taking: No sig reported)     senna (SENOKOT) 8.6 MG TABS tablet Take 8.6 mg by mouth daily as needed for mild constipation. (Patient not taking: No sig reported)     No current facility-administered medications for this visit.    PHYSICAL EXAMINATION: ECOG PERFORMANCE STATUS: 0 - Asymptomatic  Vitals:   12/03/20 0957  BP: (!) 193/97  Pulse: 98  Resp: 18  Temp: 98.4 F (36.9 C)  SpO2: 97%   Filed Weights   12/03/20 0957  Weight: 119 lb 11.2 oz (54.3 kg)    GENERAL:alert, no distress and comfortable SKIN: scattered ecchymoses to the lower forearms EYES:  sclera clear LYMPH:  no palpable axillary lymphadenopathy LUNGS: clear with normal breathing effort HEART: regular rate & rhythm, no lower extremity edema Musculoskeletal: No focal spinal tenderness NEURO: alert & oriented x 3 with fluent speech, no focal motor/sensory deficits No PAC Breast exam deferred  LABORATORY DATA:  I have reviewed the data as listed CBC Latest Ref Rng & Units 12/03/2020 11/11/2020 10/21/2020  WBC 4.0 - 10.5 K/uL 6.2 4.9 10.0  Hemoglobin 12.0 - 15.0 g/dL 12.6 12.0 11.7(L)  Hematocrit 36.0 - 46.0 % 37.6 37.6 35.8(L)  Platelets 150 - 400 K/uL 190 181 190     CMP Latest Ref Rng & Units 12/03/2020 11/11/2020 10/21/2020  Glucose 70 - 99 mg/dL 133(H) 200(H) 127(H)  BUN 8 - 23 mg/dL _0 Creatinine 0.44 - 1.00 mg/dL 0.83 0.84 0.79  Sodium 135 - 145 mmol/L 143 141 141  Potassium 3.5 - 5.1 mmol/L 3.9 3.8 3.7  Chloride 98 - 111 mmol/L 106 105 102  CO2 22 - 32  mmol/L _1 Calcium 8.9 - 10.3 mg/dL 9.7 9.3 9.5  Total Protein 6.5 - 8.1 g/dL - - -  Total Bilirubin 0.3 - 1.2 mg/dL - - -  Alkaline Phos 38 - 126 U/L - - -  AST 15 - 41 U/L - - -  ALT 0 - 44 U/L - - -      RADIOGRAPHIC STUDIES: I have personally reviewed the radiological images as listed and agreed with the findings in the report. No results found.   ASSESSMENT & PLAN: 80 year old female  Metastatic breast cancer  -CT evidence of hepatic, osseous, pulmonary, and axillary nodal metastatic disease  -biopsy 02/29/2020 left axillary/chest wall mass confirmed metastatic carcinoma consistent with breast primary, ER +50% weak, PR negative, HER2 positive 3+ Elevated blood pressure, not on medication. Possibly stress-induced    Disposition: Morgan Yu appears stable.  She continues daily anastrozole,  q3 week Herceptin, and q3 month Zometa (due 11/30).  She is tolerating treatment very well overall with no significant side effects.  She is able to recover and function well with good performance status.  There is no clinical evidence of disease progression.  Labs reviewed, adequate to proceed with Herceptin today as planned.  Continue anastrozole.  She is scheduled to return in 4 weeks (after Thanksgiving) for next cycle of Herceptin and Zometa.  Per Dr. Iruku's last note, the plan is to restage and repeat echo in December.  All questions were answered. The patient knows to call the clinic with any problems, questions or concerns. No barriers to learning were detected.     Lacie K Burton, NP 12/03/20     

## 2020-12-03 NOTE — Patient Instructions (Signed)
[facility]  Discharge Instructions: Thank you for choosing [facility] to provide your oncology and hematology care.   If you have a lab appointment with the [facility], please go directly to the [facility] and check in at the registration area.   Wear comfortable clothing and clothing appropriate for easy access to any Portacath or PICC line.   We strive to give you quality time with your provider. You may need to reschedule your appointment if you arrive late (15 or more minutes).  Arriving late affects you and other patients whose appointments are after yours.  Also, if you miss three or more appointments without notifying the office, you may be dismissed from the clinic at the provider's discretion.      For prescription refill requests, have your pharmacy contact our office and allow 72 hours for refills to be completed.    Today you received the following chemotherapy and/or immunotherapy agents Kanjinti      To help prevent nausea and vomiting after your treatment, we encourage you to take your nausea medication as directed.  BELOW ARE SYMPTOMS THAT SHOULD BE REPORTED IMMEDIATELY: *FEVER GREATER THAN 100.4 F (38 C) OR HIGHER *CHILLS OR SWEATING *NAUSEA AND VOMITING THAT IS NOT CONTROLLED WITH YOUR NAUSEA MEDICATION *UNUSUAL SHORTNESS OF BREATH *UNUSUAL BRUISING OR BLEEDING *URINARY PROBLEMS (pain or burning when urinating, or frequent urination) *BOWEL PROBLEMS (unusual diarrhea, constipation, pain near the anus) TENDERNESS IN MOUTH AND THROAT WITH OR WITHOUT PRESENCE OF ULCERS (sore throat, sores in mouth, or a toothache) UNUSUAL RASH, SWELLING OR PAIN  UNUSUAL VAGINAL DISCHARGE OR ITCHING   Items with * indicate a potential emergency and should be followed up as soon as possible or go to the Emergency Department if any problems should occur.  Please show the CHEMOTHERAPY ALERT CARD or IMMUNOTHERAPY ALERT CARD at check-in to  the Emergency Department and triage nurse.  Should you have questions after your visit or need to cancel or reschedule your appointment, please contact [facility]  Dept: [phone]  and follow the prompts.  Office hours are 8:00 a.m. to 4:30 p.m. Monday - Friday. Please note that voicemails left after 4:00 p.m. may not be returned until the following business day.  We are closed weekends and major holidays. You have access to a nurse at all times for urgent questions. Please call the main number to the clinic Dept: [phone] and follow the prompts.   For any non-urgent questions, you may also contact your provider using MyChart. We now offer e-Visits for anyone 30 and older to request care online for non-urgent symptoms. For details visit [email or web address].   Also download the MyChart app! Go to the app store, search "MyChart", open the app, select [facility], and log in with your MyChart username and password.  Due to Covid, a mask is required upon entering the hospital/clinic. If you do not have a mask, one will be given to you upon arrival. For doctor visits, patients may have 1 support person aged 27 or older with them. For treatment visits, patients cannot have anyone with them due to current Covid guidelines and our immunocompromised population.   Trastuzumab injection for infusion What is this medication? TRASTUZUMAB (tras TOO zoo mab) is a monoclonal antibody. It is used to treat breast cancer and stomach cancer. This medicine may be used for other purposes; ask your health care provider or pharmacist if you have questions. COMMON BRAND NAME(S):  Herceptin, Donnald Garre What should I tell my care team before I take this medication? They need to know if you have any of these conditions: heart disease heart failure lung or breathing disease, like asthma an unusual or allergic reaction to trastuzumab, benzyl  alcohol, or other medications, foods, dyes, or preservatives pregnant or trying to get pregnant breast-feeding How should I use this medication? This drug is given as an infusion into a vein. It is administered in a hospital or clinic by a specially trained health care professional. Talk to your pediatrician regarding the use of this medicine in children. This medicine is not approved for use in children. Overdosage: If you think you have taken too much of this medicine contact a poison control center or emergency room at once. NOTE: This medicine is only for you. Do not share this medicine with others. What if I miss a dose? It is important not to miss a dose. Call your doctor or health care professional if you are unable to keep an appointment. What may interact with this medication? This medicine may interact with the following medications: certain types of chemotherapy, such as daunorubicin, doxorubicin, epirubicin, and idarubicin This list may not describe all possible interactions. Give your health care provider a list of all the medicines, herbs, non-prescription drugs, or dietary supplements you use. Also tell them if you smoke, drink alcohol, or use illegal drugs. Some items may interact with your medicine. What should I watch for while using this medication? Visit your doctor for checks on your progress. Report any side effects. Continue your course of treatment even though you feel ill unless your doctor tells you to stop. Call your doctor or health care professional for advice if you get a fever, chills or sore throat, or other symptoms of a cold or flu. Do not treat yourself. Try to avoid being around people who are sick. You may experience fever, chills and shaking during your first infusion. These effects are usually mild and can be treated with other medicines. Report any side effects during the infusion to your health care professional. Fever and chills usually do not happen with  later infusions. Do not become pregnant while taking this medicine or for 7 months after stopping it. Women should inform their doctor if they wish to become pregnant or think they might be pregnant. Women of child-bearing potential will need to have a negative pregnancy test before starting this medicine. There is a potential for serious side effects to an unborn child. Talk to your health care professional or pharmacist for more information. Do not breast-feed an infant while taking this medicine or for 7 months after stopping it. Women must use effective birth control with this medicine. What side effects may I notice from receiving this medication? Side effects that you should report to your doctor or health care professional as soon as possible: allergic reactions like skin rash, itching or hives, swelling of the face, lips, or tongue chest pain or palpitations cough dizziness feeling faint or lightheaded, falls fever general ill feeling or flu-like symptoms signs of worsening heart failure like breathing problems; swelling in your legs and feet unusually weak or tired Side effects that usually do not require medical attention (report to your doctor or health care professional if they continue or are bothersome): bone pain changes in taste diarrhea joint pain nausea/vomiting weight loss This list may not describe all possible side effects. Call your doctor for medical advice about side effects.  You may report side effects to FDA at 1-800-FDA-1088. Where should I keep my medication? This drug is given in a hospital or clinic and will not be stored at home. NOTE: This sheet is a summary. It may not cover all possible information. If you have questions about this medicine, talk to your doctor, pharmacist, or health care provider.  2022 Elsevier/Gold Standard (2016-01-13 14:37:52)

## 2020-12-04 ENCOUNTER — Telehealth: Payer: Self-pay

## 2020-12-04 NOTE — Telephone Encounter (Signed)
I called Morgan Yu to update her on her echo and CT CAP being scheduled. I told her it was on 12/15 starting at 9am. I told her to come a little earlier so she can pick up her contrast and that she will drink the first bottle at 10am (after the echo at 9am) and the next bottle at 11am, preceding her appt at 12p. I also told her she would need to be NPO at 8am. All questions were answered. She verbalized understanding and stated that she has had one of these procedures done before. I told her to call back with any questions/concerns.

## 2020-12-06 DIAGNOSIS — J90 Pleural effusion, not elsewhere classified: Secondary | ICD-10-CM | POA: Diagnosis not present

## 2020-12-06 DIAGNOSIS — J9601 Acute respiratory failure with hypoxia: Secondary | ICD-10-CM | POA: Diagnosis not present

## 2020-12-06 DIAGNOSIS — C799 Secondary malignant neoplasm of unspecified site: Secondary | ICD-10-CM | POA: Diagnosis not present

## 2020-12-24 ENCOUNTER — Ambulatory Visit: Payer: Medicare HMO | Admitting: Physician Assistant

## 2020-12-24 ENCOUNTER — Ambulatory Visit: Payer: Medicare HMO

## 2020-12-24 ENCOUNTER — Other Ambulatory Visit: Payer: Medicare HMO

## 2020-12-30 ENCOUNTER — Other Ambulatory Visit: Payer: Self-pay | Admitting: Physician Assistant

## 2020-12-30 DIAGNOSIS — C7951 Secondary malignant neoplasm of bone: Secondary | ICD-10-CM

## 2020-12-30 DIAGNOSIS — C50919 Malignant neoplasm of unspecified site of unspecified female breast: Secondary | ICD-10-CM

## 2020-12-31 ENCOUNTER — Inpatient Hospital Stay: Payer: Medicare HMO

## 2020-12-31 ENCOUNTER — Inpatient Hospital Stay: Payer: Medicare HMO | Admitting: Physician Assistant

## 2020-12-31 ENCOUNTER — Ambulatory Visit: Payer: Medicare HMO

## 2020-12-31 ENCOUNTER — Other Ambulatory Visit: Payer: Self-pay

## 2020-12-31 ENCOUNTER — Other Ambulatory Visit: Payer: Medicare HMO

## 2020-12-31 VITALS — BP 185/84 | HR 90 | Temp 98.6°F | Resp 18 | Ht 61.0 in | Wt 123.1 lb

## 2020-12-31 VITALS — BP 162/64 | HR 82

## 2020-12-31 DIAGNOSIS — C7951 Secondary malignant neoplasm of bone: Secondary | ICD-10-CM

## 2020-12-31 DIAGNOSIS — Z5112 Encounter for antineoplastic immunotherapy: Secondary | ICD-10-CM | POA: Diagnosis not present

## 2020-12-31 DIAGNOSIS — Z79811 Long term (current) use of aromatase inhibitors: Secondary | ICD-10-CM | POA: Diagnosis not present

## 2020-12-31 DIAGNOSIS — C50912 Malignant neoplasm of unspecified site of left female breast: Secondary | ICD-10-CM | POA: Diagnosis not present

## 2020-12-31 DIAGNOSIS — Z923 Personal history of irradiation: Secondary | ICD-10-CM | POA: Diagnosis not present

## 2020-12-31 DIAGNOSIS — C50919 Malignant neoplasm of unspecified site of unspecified female breast: Secondary | ICD-10-CM

## 2020-12-31 DIAGNOSIS — I890 Lymphedema, not elsewhere classified: Secondary | ICD-10-CM

## 2020-12-31 DIAGNOSIS — R5383 Other fatigue: Secondary | ICD-10-CM | POA: Diagnosis not present

## 2020-12-31 DIAGNOSIS — Z170 Estrogen receptor positive status [ER+]: Secondary | ICD-10-CM | POA: Diagnosis not present

## 2020-12-31 DIAGNOSIS — F411 Generalized anxiety disorder: Secondary | ICD-10-CM

## 2020-12-31 LAB — CBC WITH DIFFERENTIAL (CANCER CENTER ONLY)
Abs Immature Granulocytes: 0.01 10*3/uL (ref 0.00–0.07)
Basophils Absolute: 0 10*3/uL (ref 0.0–0.1)
Basophils Relative: 0 %
Eosinophils Absolute: 0.1 10*3/uL (ref 0.0–0.5)
Eosinophils Relative: 2 %
HCT: 39 % (ref 36.0–46.0)
Hemoglobin: 12.9 g/dL (ref 12.0–15.0)
Immature Granulocytes: 0 %
Lymphocytes Relative: 24 %
Lymphs Abs: 1.5 10*3/uL (ref 0.7–4.0)
MCH: 26.9 pg (ref 26.0–34.0)
MCHC: 33.1 g/dL (ref 30.0–36.0)
MCV: 81.4 fL (ref 80.0–100.0)
Monocytes Absolute: 1 10*3/uL (ref 0.1–1.0)
Monocytes Relative: 17 %
Neutro Abs: 3.6 10*3/uL (ref 1.7–7.7)
Neutrophils Relative %: 57 %
Platelet Count: 175 10*3/uL (ref 150–400)
RBC: 4.79 MIL/uL (ref 3.87–5.11)
RDW: 14.7 % (ref 11.5–15.5)
WBC Count: 6.2 10*3/uL (ref 4.0–10.5)
nRBC: 0 % (ref 0.0–0.2)

## 2020-12-31 LAB — CMP (CANCER CENTER ONLY)
ALT: 20 U/L (ref 0–44)
AST: 28 U/L (ref 15–41)
Albumin: 4 g/dL (ref 3.5–5.0)
Alkaline Phosphatase: 89 U/L (ref 38–126)
Anion gap: 8 (ref 5–15)
BUN: 18 mg/dL (ref 8–23)
CO2: 27 mmol/L (ref 22–32)
Calcium: 9.3 mg/dL (ref 8.9–10.3)
Chloride: 103 mmol/L (ref 98–111)
Creatinine: 0.8 mg/dL (ref 0.44–1.00)
GFR, Estimated: >60 mL/min (ref >60)
Glucose, Bld: 118 mg/dL — ABNORMAL HIGH (ref 70–99)
Potassium: 3.8 mmol/L (ref 3.5–5.1)
Sodium: 138 mmol/L (ref 135–145)
Total Bilirubin: 0.5 mg/dL (ref 0.3–1.2)
Total Protein: 7.4 g/dL (ref 6.5–8.1)

## 2020-12-31 MED ORDER — SODIUM CHLORIDE 0.9 % IV SOLN
Freq: Once | INTRAVENOUS | Status: AC
Start: 2020-12-31 — End: 2020-12-31

## 2020-12-31 MED ORDER — TRASTUZUMAB-ANNS CHEMO 150 MG IV SOLR
300.0000 mg | Freq: Once | INTRAVENOUS | Status: AC
  Administered 2020-12-31: 300 mg via INTRAVENOUS
  Filled 2020-12-31: qty 14.29

## 2020-12-31 MED ORDER — ZOLEDRONIC ACID 4 MG/5ML IV CONC
3.5000 mg | Freq: Once | INTRAVENOUS | Status: AC
  Administered 2020-12-31: 3.5 mg via INTRAVENOUS
  Filled 2020-12-31: qty 4.38

## 2020-12-31 NOTE — Progress Notes (Signed)
[facility]   Telephone:(336) [phone] Fax:(336) [phone]   Clinic Follow up Note   Patient Care Team: Patient, [name] as PCP - General ([place]) [name], MD as Consulting Physician (Hematology and Oncology) 12/31/2020  CHIEF COMPLAINT: Stage IV left breast cancer, ER+ / PR- / Her2+, grade 3  SUMMARY OF ONCOLOGIC HISTORY: Oncology History  Bone metastases ([place])  02/27/2020 Initial Diagnosis   Bone metastases ([place])   Carcinoma of breast metastatic to bone ([place])  03/19/2020 Initial Diagnosis   Carcinoma of breast metastatic to bone ([place])   03/24/2020 -  Chemotherapy   Patient is on Treatment Plan : BREAST Trastuzumab q21d     04/15/2020 Cancer Staging   Staging form: Breast, AJCC 8th Edition - Clinical stage from 04/15/2020: Stage IV (cTX, cNX, cM1, GX, ER+, PR-, HER2+) - Signed by [name], MD on 04/15/2020 Stage prefix: Initial diagnosis Histologic grading system: 3 grade system      CURRENT THERAPY:  Anastrozole 1 mg p.o. daily Herceptin/Kanjinti q3 weeks Zometa q3 months, next one due today.   INTERVAL HISTORY: Ms. Pina returns for follow-up and treatment as scheduled.  She was last seen by Silvio Clayman, NP on 12/03/2020.  Since then, she denies any changes to her health.  On exam today, Ms. Butzin reports improvement of her energy levels.  She stays active and completes all her daily activities on her own.  She has a good appetite with noted weight gain.  She denies any nausea, vomiting or abdominal pain.  Her bowel habits are regular without any diarrhea or constipation.  She denies fevers, chills, shortness of breath, chest pain, cough, headaches, dizziness or neuropathy.  She is tolerating anastrozole, without bone or joint pain, hot flashes, or mood fluctuations. She has no other complaints.    MEDICAL HISTORY:  Past Medical History:  Diagnosis Date   Breast cancer [facility])    with bone metastasis    SURGICAL  HISTORY: Past Surgical History:  Procedure Laterality Date   IR PERC PLEURAL DRAIN W/INDWELL CATH W/IMG GUIDE  06/03/2020   IR RADIOLOGIST EVAL & MGMT  07/31/2020   IR REMOVAL OF PLURAL CATH W/CUFF  10/31/2020    I have reviewed the social history and family history with the patient and they are unchanged from previous note.  ALLERGIES:  is allergic to vicodin hp [hydrocodone-acetaminophen].  MEDICATIONS:  Current Outpatient Medications  Medication Sig Dispense Refill   anastrozole (ARIMIDEX) 1 MG tablet Take 1 tablet (1 mg total) by mouth daily. 30 tablet 11   furosemide (LASIX) 20 MG tablet Take 1 tablet (20 mg total) by mouth daily. 30 tablet 0   acetaminophen (TYLENOL) 500 MG tablet Take 500 mg by mouth every 6 (six) hours as needed for moderate pain. (Patient not taking: Reported on 10/21/2020)     benzonatate (TESSALON) 100 MG capsule Take 1 capsule (100 mg total) by mouth 3 (three) times daily. (Patient not taking: Reported on 10/21/2020) 20 capsule 0   Chlorphen-Pseudoephed-APAP (TYLENOL ALLERGY SINUS PO) Take 1 tablet by mouth daily as needed (allergy). (Patient not taking: Reported on 10/21/2020)     chlorpheniramine-HYDROcodone (TUSSIONEX) 10-8 MG/5ML SUER Take 5 mLs by mouth every 6 (six) hours as needed for cough (If unresponsive to Robitussin DM). (Patient not taking: Reported on 10/21/2020) 140 mL 0   feeding supplement (ENSURE ENLIVE / ENSURE PLUS) LIQD Take 237 mLs by mouth 3 (three) times daily between meals. (Patient not taking: Reported on 10/21/2020) 237  mL 12   guaiFENesin (MUCINEX) 600 MG 12 hr tablet Take 1 tablet (600 mg total) by mouth 2 (two) times daily. (Patient not taking: Reported on 11/11/2020) 60 tablet 0   LORazepam (ATIVAN) 0.5 MG tablet Take 1 tablet (0.5 mg total) by mouth every 8 (eight) hours as needed for anxiety. Take 1 hour prior to radiation therapy (Patient not taking: Reported on 11/11/2020) 30 tablet 0   ondansetron (ZOFRAN) 4 MG tablet Take 1 tablet (4 mg  total) by mouth every 8 (eight) hours as needed for nausea or vomiting. (Patient not taking: Reported on 10/21/2020) 40 tablet 1   oxyCODONE (OXY IR/ROXICODONE) 5 MG immediate release tablet Take 0.5-1 tablets (2.5-5 mg total) by mouth every 4 (four) hours as needed for severe pain (dyspnea). (Patient not taking: Reported on 10/21/2020) 15 tablet 0   oxymetazoline (AFRIN) 0.05 % nasal spray Place 1 spray into both nostrils daily as needed for congestion. (Patient not taking: Reported on 10/21/2020)     senna (SENOKOT) 8.6 MG TABS tablet Take 8.6 mg by mouth daily as needed for mild constipation. (Patient not taking: Reported on 10/21/2020)     No current facility-administered medications for this visit.   Review of Systems  Constitutional:  Negative for chills, fever, malaise/fatigue and weight loss.  HENT:  Negative for sore throat.   Respiratory:  Negative for cough, shortness of breath and wheezing.   Cardiovascular:  Negative for chest pain, palpitations and leg swelling.  Gastrointestinal:  Negative for abdominal pain, blood in stool, constipation, diarrhea, melena, nausea and vomiting.  Musculoskeletal:  Negative for myalgias.  Skin:  Negative for itching and rash.    PHYSICAL EXAMINATION: ECOG PERFORMANCE STATUS: 0 - Asymptomatic  Vitals:   12/31/20 0903  BP: (!) 185/84  Pulse: 90  Resp: 18  Temp: 98.6 F (37 C)  SpO2: 100%   Filed Weights   12/31/20 0903  Weight: 123 lb 1.6 oz (55.8 kg)   Physical exam: ECOG performance status: 0 HENT: Normocephalic and atraumatic. No mucositis. Mouth is moist.  CARDIO: Normal rate and regular rhythm. Normal heart sounds PULM: Normal breath sounds. No wheezing, rales and rhonchi.  ABD: Abdomen is flat, soft and nontender.  MSK: LUE lymphedema with compression sleeve on. Normal range of motion. SKIN: Warm and dry. NEURO: No focal deficits. Alert and oriented x 3.  BREAST: Left breast has stable skin changes with erythema and  nodularity  LABORATORY DATA:  I have reviewed the data as listed CBC Latest Ref Rng & Units 12/31/2020 12/03/2020 11/11/2020  WBC 4.0 - 10.5 K/uL 6.2 6.2 4.9  Hemoglobin 12.0 - 15.0 g/dL 12.9 12.6 12.0  Hematocrit 36.0 - 46.0 % 39.0 37.6 37.6  Platelets 150 - 400 K/uL 175 190 181     CMP Latest Ref Rng & Units 12/03/2020 11/11/2020 10/21/2020  Glucose 70 - 99 mg/dL 133(H) 200(H) 127(H)  BUN 8 - 23 mg/dL _0 Creatinine 0.44 - 1.00 mg/dL 0.83 0.84 0.79  Sodium 135 - 145 mmol/L 143 141 141  Potassium 3.5 - 5.1 mmol/L 3.9 3.8 3.7  Chloride 98 - 111 mmol/L 106 105 102  CO2 22 - 32 mmol/L _1 Calcium 8.9 - 10.3 mg/dL 9.7 9.3 9.5  Total Protein 6.5 - 8.1 g/dL - - -  Total Bilirubin 0.3 - 1.2 mg/dL - - -  Alkaline Phos 38 - 126 U/L - - -  AST 15 - 41 U/L - - -  ALT 0 -  44 U/L - - -      RADIOGRAPHIC STUDIES: I have personally reviewed the radiological images as listed and agreed with the findings in the report. No results found.   ASSESSMENT & PLAN: 80 year old female  # Metastatic breast cancer  -CT evidence of hepatic, osseous, pulmonary, and axillary nodal metastatic disease  -Biopsy 02/29/2020 left axillary/chest wall mass confirmed metastatic carcinoma consistent with breast primary, ER +50% weak, PR negative, HER2 positive 3+ -Started Trastuzumab q 21 days on 03/24/2020. Receives Zometa q 3 months.  -Currently on Anastrozole 1 mg p.o. daily without any side effects.  -Received palliative radiation to the left axilla from 05/28/2020-06/25/2020 -Labs from today were reviewed and adequate for treatment today. Patient will proceed with schedule Trastuzumab and Zometa today.  -Return in 3 weeks for next schedule Trastuzumab infusion with restaging CT scan and echo.   #LUE Lymphedema: --Currently wears compression sleeve.  --I will request a follow up with PT since drain has been removed.    All questions were answered. The patient knows to call the clinic with any  problems, questions or concerns. No barriers to learning were detected.  I have spent a total of 30 minutes minutes of face-to-face and non-face-to-face time, preparing to see the patient, performing a medically appropriate examination, counseling and educating the patient, referring and communicating with other health care professionals, documenting clinical information in the electronic health record, and care coordination.    Lincoln Brigham, PA-C Hematology and [place] at [place]: [phone]

## 2020-12-31 NOTE — Progress Notes (Addendum)
Ok to proceed with Trastuzumab 300mg  today per MD.  Will increase Trastuzumab dose with future treatments.   Raul Del Blythe, [place], BCPS, BCOP 12/31/2020 11:08 AM

## 2020-12-31 NOTE — Patient Instructions (Signed)
Trastuzumab; Hyaluronidase injection What is this medication? TRASTUZUMAB; HYALURONIDASE (tras TOO zoo mab / hye al ur ON i dase) is used to treat breast cancer and stomach cancer. Trastuzumab is a monoclonal antibody. Hyaluronidase is used to improve the effects of trastuzumab. This medicine may be used for other purposes; ask your health care provider or pharmacist if you have questions. COMMON BRAND NAME(S): HERCEPTIN HYLECTA What should I tell my care team before I take this medication? They need to know if you have any of these conditions: heart disease heart failure lung or breathing disease, like asthma an unusual or allergic reaction to trastuzumab, or other medications, foods, dyes, or preservatives pregnant or trying to get pregnant breast-feeding How should I use this medication? This medicine is for injection under the skin. It is given by a health care professional in a hospital or clinic setting. Talk to your pediatrician regarding the use of this medicine in children. This medicine is not approved for use in children. Overdosage: If you think you have taken too much of this medicine contact a poison control center or emergency room at once. NOTE: This medicine is only for you. Do not share this medicine with others. What if I miss a dose? It is important not to miss a dose. Call your doctor or health care professional if you are unable to keep an appointment. What may interact with this medication? This medicine may interact with the following medications: certain types of chemotherapy, such as daunorubicin, doxorubicin, epirubicin, and idarubicin This list may not describe all possible interactions. Give your health care provider a list of all the medicines, herbs, non-prescription drugs, or dietary supplements you use. Also tell them if you smoke, drink alcohol, or use illegal drugs. Some items may interact with your medicine. What should I watch for while using this  medication? Visit your doctor for checks on your progress. Report any side effects. Continue your course of treatment even though you feel ill unless your doctor tells you to stop. Call your doctor or health care professional for advice if you get a fever, chills or sore throat, or other symptoms of a cold or flu. Do not treat yourself. Try to avoid being around people who are sick. You may experience fever, chills and shaking during your first infusion. These effects are usually mild and can be treated with other medicines. Report any side effects during the infusion to your health care professional. Fever and chills usually do not happen with later infusions. Do not become pregnant while taking this medicine or for 7 months after stopping it. Women should inform their doctor if they wish to become pregnant or think they might be pregnant. Women of child-bearing potential will need to have a negative pregnancy test before starting this medicine. There is a potential for serious side effects to an unborn child. Talk to your health care professional or pharmacist for more information. Do not breast-feed an infant while taking this medicine or for 7 months after stopping it. What side effects may I notice from receiving this medication? Side effects that you should report to your doctor or health care professional as soon as possible: allergic reactions like skin rash, itching or hives, swelling of the face, lips, or tongue breathing problems chest pain or palpitations cough fever general ill feeling or flu-like symptoms signs of worsening heart failure like breathing problems; swelling in your legs and feet Side effects that usually do not require medical attention (report these to your doctor  or health care professional if they continue or are bothersome): bone pain changes in taste diarrhea joint pain nausea/vomiting unusually weak or tired weight loss This list may not describe all possible  side effects. Call your doctor for medical advice about side effects. You may report side effects to FDA at 1-800-FDA-1088. Where should I keep my medication? This drug is given in a hospital or clinic and will not be stored at home. NOTE: This sheet is a summary. It may not cover all possible information. If you have questions about this medicine, talk to your doctor, pharmacist, or health care provider.  2022 Elsevier/Gold Standard (2017-06-06 00:00:00)  Zoledronic Acid Injection (Hypercalcemia, Oncology) What is this medication? ZOLEDRONIC ACID (ZOE le dron ik AS id) slows calcium loss from bones. It high calcium levels in the blood from some kinds of cancer. It may be used in other people at risk for bone loss. This medicine may be used for other purposes; ask your health care provider or pharmacist if you have questions. COMMON BRAND NAME(S): Zometa What should I tell my care team before I take this medication? They need to know if you have any of these conditions: cancer dehydration dental disease kidney disease liver disease low levels of calcium in the blood lung or breathing disease (asthma) receiving steroids like dexamethasone or prednisone an unusual or allergic reaction to zoledronic acid, other medicines, foods, dyes, or preservatives pregnant or trying to get pregnant breast-feeding How should I use this medication? This drug is injected into a vein. It is given by a health care provider in a hospital or clinic setting. Talk to your health care provider about the use of this drug in children. Special care may be needed. Overdosage: If you think you have taken too much of this medicine contact a poison control center or emergency room at once. NOTE: This medicine is only for you. Do not share this medicine with others. What if I miss a dose? Keep appointments for follow-up doses. It is important not to miss your dose. Call your health care provider if you are unable to  keep an appointment. What may interact with this medication? certain antibiotics given by injection NSAIDs, medicines for pain and inflammation, like ibuprofen or naproxen some diuretics like bumetanide, furosemide teriparatide thalidomide This list may not describe all possible interactions. Give your health care provider a list of all the medicines, herbs, non-prescription drugs, or dietary supplements you use. Also tell them if you smoke, drink alcohol, or use illegal drugs. Some items may interact with your medicine. What should I watch for while using this medication? Visit your health care provider for regular checks on your progress. It may be some time before you see the benefit from this drug. Some people who take this drug have severe bone, joint, or muscle pain. This drug may also increase your risk for jaw problems or a broken thigh bone. Tell your health care provider right away if you have severe pain in your jaw, bones, joints, or muscles. Tell you health care provider if you have any pain that does not go away or that gets worse. Tell your dentist and dental surgeon that you are taking this drug. You should not have major dental surgery while on this drug. See your dentist to have a dental exam and fix any dental problems before starting this drug. Take good care of your teeth while on this drug. Make sure you see your dentist for regular follow-up appointments. You should  make sure you get enough calcium and vitamin D while you are taking this drug. Discuss the foods you eat and the vitamins you take with your health care provider. Check with your health care provider if you have severe diarrhea, nausea, and vomiting, or if you sweat a lot. The loss of too much body fluid may make it dangerous for you to take this drug. You may need blood work done while you are taking this drug. Do not become pregnant while taking this drug. Women should inform their health care provider if they wish  to become pregnant or think they might be pregnant. There is potential for serious harm to an unborn child. Talk to your health care provider for more information. What side effects may I notice from receiving this medication? Side effects that you should report to your doctor or health care provider as soon as possible: allergic reactions (skin rash, itching or hives; swelling of the face, lips, or tongue) bone pain infection (fever, chills, cough, sore throat, pain or trouble passing urine) jaw pain, especially after dental work joint pain kidney injury (trouble passing urine or change in the amount of urine) low blood pressure (dizziness; feeling faint or lightheaded, falls; unusually weak or tired) low calcium levels (fast heartbeat; muscle cramps or pain; pain, tingling, or numbness in the hands or feet; seizures) low magnesium levels (fast, irregular heartbeat; muscle cramp or pain; muscle weakness; tremors; seizures) low red blood cell counts (trouble breathing; feeling faint; lightheaded, falls; unusually weak or tired) muscle pain redness, blistering, peeling, or loosening of the skin, including inside the mouth severe diarrhea swelling of the ankles, feet, hands trouble breathing Side effects that usually do not require medical attention (report to your doctor or health care provider if they continue or are bothersome): anxious constipation coughing depressed mood eye irritation, itching, or pain fever general ill feeling or flu-like symptoms nausea pain, redness, or irritation at site where injected trouble sleeping This list may not describe all possible side effects. Call your doctor for medical advice about side effects. You may report side effects to FDA at 1-800-FDA-1088. Where should I keep my medication? This drug is given in a hospital or clinic. It will not be stored at home. NOTE: This sheet is a summary. It may not cover all possible information. If you have  questions about this medicine, talk to your doctor, pharmacist, or health care provider.  2022 Elsevier/Gold Standard (2020-10-07 00:00:00)

## 2021-01-05 DIAGNOSIS — J9601 Acute respiratory failure with hypoxia: Secondary | ICD-10-CM | POA: Diagnosis not present

## 2021-01-05 DIAGNOSIS — C799 Secondary malignant neoplasm of unspecified site: Secondary | ICD-10-CM | POA: Diagnosis not present

## 2021-01-05 DIAGNOSIS — J90 Pleural effusion, not elsewhere classified: Secondary | ICD-10-CM | POA: Diagnosis not present

## 2021-01-07 ENCOUNTER — Inpatient Hospital Stay: Payer: Medicare HMO

## 2021-01-14 ENCOUNTER — Ambulatory Visit: Payer: Medicare HMO

## 2021-01-14 ENCOUNTER — Other Ambulatory Visit: Payer: Self-pay

## 2021-01-14 ENCOUNTER — Ambulatory Visit: Payer: Medicare HMO | Admitting: Hematology and Oncology

## 2021-01-14 ENCOUNTER — Ambulatory Visit: Payer: Medicare HMO | Attending: Hematology and Oncology | Admitting: Physical Therapy

## 2021-01-14 ENCOUNTER — Encounter: Payer: Self-pay | Admitting: Physical Therapy

## 2021-01-14 ENCOUNTER — Other Ambulatory Visit: Payer: Medicare HMO

## 2021-01-14 DIAGNOSIS — M25612 Stiffness of left shoulder, not elsewhere classified: Secondary | ICD-10-CM | POA: Diagnosis not present

## 2021-01-14 DIAGNOSIS — C50912 Malignant neoplasm of unspecified site of left female breast: Secondary | ICD-10-CM | POA: Insufficient documentation

## 2021-01-14 DIAGNOSIS — Z170 Estrogen receptor positive status [ER+]: Secondary | ICD-10-CM | POA: Diagnosis not present

## 2021-01-14 DIAGNOSIS — I890 Lymphedema, not elsewhere classified: Secondary | ICD-10-CM | POA: Diagnosis not present

## 2021-01-14 NOTE — Therapy (Signed)
[facility] @ [address] [place], [place], [address] Phone: [phone]   Fax:  [phone]  Physical Therapy Evaluation  Patient Details  Name: Morgan Yu MRN: [id number] Date of Birth: 1940-05-08 Referring Provider (PT): Iruku   Encounter Date: 01/14/2021   PT End of Session - 01/14/21 1358     Visit Number 1    Number of Visits 19    Date for PT Re-Evaluation 03/18/21   on hold until Jan 9 due to scheduling conflicts   PT Start Time 1300    PT Stop Time 1350    PT Time Calculation (min) 50 min    Activity Tolerance Patient tolerated treatment well    Behavior During Therapy [facility] for tasks assessed/performed             Past Medical History:  Diagnosis Date   Breast cancer [facility])    with bone metastasis    Past Surgical History:  Procedure Laterality Date   IR PERC PLEURAL DRAIN W/INDWELL CATH W/IMG GUIDE  06/03/2020   IR RADIOLOGIST EVAL & MGMT  07/31/2020   IR REMOVAL OF PLURAL CATH W/CUFF  10/31/2020    There were no vitals filed for this visit.    Subjective Assessment - 01/14/21 1300     Subjective I ordered a compression sleeve online. I did not wear it today or yesterday. I did not get measured for it. I ordered a large and an extra large.    Pertinent History breast cancer with mets, hepatic, osseous, pulmonary, and axillary nodal metastasis, axillary chest wall mets, biopsy 02/29/20, ER+ PR- HER2+, pt was receiving radiation in April 2022 and had to stop due to anxiety from fluid build up, pt had a drain placed to drain fluid from abdominal cavity from low protein, pt taking herceptin, drain has been out since June 26th, pt has had lymphedema in LUE since Jan 2021 when she was diagnosed    Patient Stated Goals to get arm swelling down    Currently in Pain? No/denies    Pain Score 0-No pain                [facility] PT Assessment - 01/14/21 0001       Assessment   Medical Diagnosis metastatic  breast cancer    Referring Provider (PT) Iruku    Onset Date/Surgical Date 02/02/20    Hand Dominance Right    Prior Therapy none      Precautions   Precautions Other (comment)    Precaution Comments metastatic breast cancer with mets to bone      Restrictions   Weight Bearing Restrictions No      Balance Screen   Has the patient fallen in the past 6 months No    Has the patient had a decrease in activity level because of a fear of falling?  No    Is the patient reluctant to leave their home because of a fear of falling?  No      Home Environment   Living Environment Private residence    Living Arrangements Alone    Available Help at Discharge Family    Type of [place]      Prior Function   Level of [place] Retired    Leisure pt reports she has not been able to exercise since her hospitilization      Cognition   Overall Cognitive Status Within Functional Limits for tasks  assessed      Posture/Postural Control   Posture/Postural Control Postural limitations    Postural Limitations Rounded Shoulders;Forward head      AROM   Right Shoulder Extension 76 Degrees    Right Shoulder Flexion 139 Degrees    Right Shoulder ABduction 155 Degrees    Right Shoulder Internal Rotation 58 Degrees    Right Shoulder External Rotation 90 Degrees    Left Shoulder Extension 65 Degrees    Left Shoulder Flexion 89 Degrees    Left Shoulder ABduction 64 Degrees               LYMPHEDEMA/ONCOLOGY QUESTIONNAIRE - 01/14/21 0001       Type   Cancer Type metastatic breast cancer      What other symptoms do you have   Are you Having Heaviness or Tightness Yes    Are you having Pain No    Are you having pitting edema Yes    Body Site forearm    Is it Hard or Difficult finding clothes that fit Yes    Do you have infections No      Lymphedema Assessments   Lymphedema Assessments Upper extremities      Right Upper Extremity Lymphedema   15 cm Proximal  to Olecranon Process 25.2 cm    10 cm Proximal to Olecranon Process 23 cm    Olecranon Process 21.7 cm    15 cm Proximal to Ulnar Styloid Process 19.5 cm    10 cm Proximal to Ulnar Styloid Process 17.2 cm    Just Proximal to Ulnar Styloid Process 14.3 cm    Across Hand at [employer] 17.7 cm    At [place] of 2nd Digit 5.9 cm      Left Upper Extremity Lymphedema   15 cm Proximal to Olecranon Process 30.1 cm    10 cm Proximal to Olecranon Process 31.5 cm    Olecranon Process 29.2 cm    15 cm Proximal to Ulnar Styloid Process 29 cm    10 cm Proximal to Ulnar Styloid Process 26.5 cm    Just Proximal to Ulnar Styloid Process 20.6 cm    Across Hand at [employer] 18.5 cm    At [place] of 2nd Digit 5.7 cm                     Objective measurements completed on examination: See above findings.                     PT Long Term Goals - 01/14/21 1410       PT LONG TERM GOAL #1   Title Pt will demonstrate a 5 cm decrease in edema 15 cm proximal to ulnar styloid process to decrease risk of infection.    Baseline 29 cm    Time 6    Period Weeks    Status New    Target Date 03/18/21      PT LONG TERM GOAL #2   Title Pt will receive trial of FLexiTouch compression pump for long term management of lymphedema    Time 6   on hold until Jan 9 due to scheduling   Period Weeks    Status New    Target Date 03/18/21   on hold until Jan 9 due to scheduling     PT [place] #3   Title Pt will be independent in self MLD for long term management of lymphedema.  Time 6    Period Weeks    Status New    Target Date 03/18/21   on hold until Jan 9 due to scheduling     PT LONG TERM GOAL #4   Title Pt will receive an appropriate compression garment for long term management of lymphedema.    Time 6    Period Weeks    Status New    Target Date 03/18/21   on hold until Jan 9 due to scheduling     PT LONG TERM GOAL #5   Title Pt will demonstrate 120 degrees of  L shoulder abduction to allow pt to reach out to the side.    Baseline 64    Time 6    Period Weeks    Status New    Target Date 03/18/21   on hold until Jan 9 due to scheduling     Additional Long Term Goals   Additional Long Term Goals Yes      PT LONG TERM GOAL #6   Title Pt will be independent in a home exercise program for continued stretching and strengthening.    Time 6    Period Weeks    Status New    Target Date 03/18/21                    Plan - 01/14/21 1400     Clinical Impression Statement Pt reports to PT with L UE lymphedema that began while she was in the hospital earlier this year when she was diagnosed with metastatic breast cancer. She has hepatic, osseous, pulmonary, and axillary nodal mets as well as L axilla and chest wall. She is currently undergoing treatment with herceptin and zometa. She ordered a compression sleeve from the internet but was not measured for it - she just tried two different sizes to see which would fit. She reports her arm swelling decreases when wearing the sleeve. She has increased fibrosis and pitting throughout her LUE. Explained to pt the benefits of compression bandaging but that it may not work depending on the extent of axillary nodal involvment. Will attempt to begin bandaging to see if we can decrease her LUE edema. Pt has decreased L shoulder ROM and poor glenohumeral rhythm.  Pt would benefit from skilled PT services to decrease LUE lymphedema, improve L shoulder ROM as tolerated, and assist pt with obtaining an appropriate compression sleeve.    Examination-Activity Limitations Caring for Others;Reach Overhead;Lift    Stability/Clinical Decision Making Stable/Uncomplicated    Clinical Decision Making Low    Rehab Potential Good    PT Frequency 3x / week    PT Duration 6 weeks   on hold until Jan 9 due to scheduling issues   PT Treatment/Interventions ADLs/Self Care Home Management;Patient/family education;Therapeutic  exercise;Manual techniques;Manual lymph drainage;Compression bandaging;Orthotic Fit/Training;Passive range of motion;Vasopneumatic Device    PT Next Visit Plan give info on flexi and see if pt is interested on checking benefits, begin complete CDT **pt has axillary mets so it may not workScientist, forensic for garments once pt demonstrates maximal reduction, gentle PROM and AAROM exercises for L shoulder    Consulted and Agree with Plan of Care Patient             Patient will benefit from skilled therapeutic intervention in order to improve the following deficits and impairments:  Postural dysfunction, Impaired UE functional use, Increased fascial restricitons, Decreased strength, Decreased range of motion, Increased edema  Visit Diagnosis: Lymphedema,  not elsewhere classified  Stiffness of left shoulder, not elsewhere classified  Malignant neoplasm of left breast in female, estrogen receptor positive, unspecified site of breast [facility])     Problem List Patient Active Problem List   Diagnosis Date Noted   HTN (hypertension) 11/11/2020   Hospice care patient 06/05/2020   Acute hypoxemic respiratory failure ([place]) 05/28/2020   Lymphedema 05/27/2020   Bilateral lower extremity edema 05/27/2020   Malignant pleural effusion 05/27/2020   Anxiety state 05/21/2020   Carcinoma of breast metastatic to bone ([place]) 03/19/2020   Protein-calorie malnutrition, severe 03/04/2020   Pressure injury of skin 02/28/2020   Bone metastases ([place]) 02/27/2020   Elevated LFTs 02/27/2020   Elevated troponin 02/27/2020   Pleural effusion 02/27/2020   Breast mass, left 02/27/2020   Sinus tachycardia 02/27/2020    [name] Jackson, PT 01/14/2021, 2:15 PM  [facility] @ [address] [place], [place], [address] Phone: [phone]   Fax:  [phone]  Name: Morgan Yu MRN: [id number] Date of Birth: Sep 21, 1940

## 2021-01-15 ENCOUNTER — Other Ambulatory Visit: Payer: Self-pay | Admitting: Hematology and Oncology

## 2021-01-15 ENCOUNTER — Ambulatory Visit (HOSPITAL_COMMUNITY)
Admission: RE | Admit: 2021-01-15 | Discharge: 2021-01-15 | Disposition: A | Discharge status: Home / Self Care | Payer: Medicare HMO | Source: Ambulatory Visit | Attending: Hematology and Oncology | Admitting: Hematology and Oncology

## 2021-01-15 ENCOUNTER — Encounter (HOSPITAL_COMMUNITY): Payer: Self-pay

## 2021-01-15 DIAGNOSIS — R918 Other nonspecific abnormal finding of lung field: Secondary | ICD-10-CM | POA: Diagnosis not present

## 2021-01-15 DIAGNOSIS — C787 Secondary malignant neoplasm of liver and intrahepatic bile duct: Secondary | ICD-10-CM | POA: Insufficient documentation

## 2021-01-15 DIAGNOSIS — R9430 Abnormal result of cardiovascular function study, unspecified: Secondary | ICD-10-CM

## 2021-01-15 DIAGNOSIS — I2510 Atherosclerotic heart disease of native coronary artery without angina pectoris: Secondary | ICD-10-CM | POA: Diagnosis not present

## 2021-01-15 DIAGNOSIS — Z0189 Encounter for other specified special examinations: Secondary | ICD-10-CM | POA: Insufficient documentation

## 2021-01-15 DIAGNOSIS — C50919 Malignant neoplasm of unspecified site of unspecified female breast: Secondary | ICD-10-CM

## 2021-01-15 DIAGNOSIS — C50912 Malignant neoplasm of unspecified site of left female breast: Secondary | ICD-10-CM | POA: Insufficient documentation

## 2021-01-15 DIAGNOSIS — Z08 Encounter for follow-up examination after completed treatment for malignant neoplasm: Secondary | ICD-10-CM | POA: Diagnosis not present

## 2021-01-15 DIAGNOSIS — C7951 Secondary malignant neoplasm of bone: Secondary | ICD-10-CM | POA: Insufficient documentation

## 2021-01-15 DIAGNOSIS — K829 Disease of gallbladder, unspecified: Secondary | ICD-10-CM | POA: Diagnosis not present

## 2021-01-15 DIAGNOSIS — K769 Liver disease, unspecified: Secondary | ICD-10-CM | POA: Diagnosis not present

## 2021-01-15 DIAGNOSIS — I700 Atherosclerosis of aorta: Secondary | ICD-10-CM | POA: Diagnosis not present

## 2021-01-15 LAB — ECHOCARDIOGRAM COMPLETE
Area-P 1/2: 4.06 cm2
Calc EF: 45.2 %
S' Lateral: 3.7 cm
Single Plane A2C EF: 46.6 %
Single Plane A4C EF: 45.4 %

## 2021-01-15 MED ORDER — IOHEXOL 350 MG/ML SOLN
80.0000 mL | Freq: Once | INTRAVENOUS | Status: AC | PRN
  Administered 2021-01-15: 80 mL via INTRAVENOUS

## 2021-01-15 MED ORDER — IOHEXOL 9 MG/ML PO SOLN
ORAL | Status: AC
  Filled 2021-01-15: qty 1000

## 2021-01-15 MED ORDER — SODIUM CHLORIDE (PF) 0.9 % IJ SOLN
INTRAMUSCULAR | Status: AC
  Filled 2021-01-15: qty 50

## 2021-01-15 NOTE — Progress Notes (Signed)
°  Echocardiogram 2D Echocardiogram has been performed.  Morgan Yu 01/15/2021, 9:55 AM

## 2021-01-16 ENCOUNTER — Telehealth (HOSPITAL_COMMUNITY): Payer: Self-pay | Admitting: Internal Medicine

## 2021-01-20 ENCOUNTER — Other Ambulatory Visit: Payer: Self-pay

## 2021-01-20 NOTE — Progress Notes (Signed)
Per Dr.Iruku hold treatment on 01/21/21, infusion appointment to be canceled.

## 2021-01-21 ENCOUNTER — Inpatient Hospital Stay: Payer: Medicare HMO

## 2021-01-21 ENCOUNTER — Inpatient Hospital Stay (HOSPITAL_BASED_OUTPATIENT_CLINIC_OR_DEPARTMENT_OTHER): Payer: Medicare HMO | Admitting: Hematology and Oncology

## 2021-01-21 ENCOUNTER — Other Ambulatory Visit: Payer: Self-pay

## 2021-01-21 ENCOUNTER — Encounter: Payer: Self-pay | Admitting: Hematology and Oncology

## 2021-01-21 ENCOUNTER — Inpatient Hospital Stay: Payer: Medicare HMO | Attending: Hematology and Oncology

## 2021-01-21 VITALS — BP 187/81 | HR 101 | Temp 98.1°F | Resp 18 | Wt 126.2 lb

## 2021-01-21 DIAGNOSIS — C50912 Malignant neoplasm of unspecified site of left female breast: Secondary | ICD-10-CM | POA: Insufficient documentation

## 2021-01-21 DIAGNOSIS — C787 Secondary malignant neoplasm of liver and intrahepatic bile duct: Secondary | ICD-10-CM | POA: Insufficient documentation

## 2021-01-21 DIAGNOSIS — C773 Secondary and unspecified malignant neoplasm of axilla and upper limb lymph nodes: Secondary | ICD-10-CM | POA: Diagnosis not present

## 2021-01-21 DIAGNOSIS — Z170 Estrogen receptor positive status [ER+]: Secondary | ICD-10-CM | POA: Diagnosis not present

## 2021-01-21 DIAGNOSIS — C7951 Secondary malignant neoplasm of bone: Secondary | ICD-10-CM | POA: Diagnosis not present

## 2021-01-21 DIAGNOSIS — C7800 Secondary malignant neoplasm of unspecified lung: Secondary | ICD-10-CM | POA: Insufficient documentation

## 2021-01-21 DIAGNOSIS — Z79899 Other long term (current) drug therapy: Secondary | ICD-10-CM | POA: Diagnosis not present

## 2021-01-21 DIAGNOSIS — I890 Lymphedema, not elsewhere classified: Secondary | ICD-10-CM | POA: Insufficient documentation

## 2021-01-21 DIAGNOSIS — Z9221 Personal history of antineoplastic chemotherapy: Secondary | ICD-10-CM | POA: Insufficient documentation

## 2021-01-21 DIAGNOSIS — R9430 Abnormal result of cardiovascular function study, unspecified: Secondary | ICD-10-CM | POA: Diagnosis not present

## 2021-01-21 DIAGNOSIS — Z79811 Long term (current) use of aromatase inhibitors: Secondary | ICD-10-CM | POA: Diagnosis not present

## 2021-01-21 DIAGNOSIS — C50919 Malignant neoplasm of unspecified site of unspecified female breast: Secondary | ICD-10-CM | POA: Diagnosis not present

## 2021-01-21 LAB — CBC WITH DIFFERENTIAL/PLATELET
Abs Immature Granulocytes: 0.01 10*3/uL (ref 0.00–0.07)
Basophils Absolute: 0 10*3/uL (ref 0.0–0.1)
Basophils Relative: 1 %
Eosinophils Absolute: 0.1 10*3/uL (ref 0.0–0.5)
Eosinophils Relative: 2 %
HCT: 37.4 % (ref 36.0–46.0)
Hemoglobin: 12.2 g/dL (ref 12.0–15.0)
Immature Granulocytes: 0 %
Lymphocytes Relative: 22 %
Lymphs Abs: 1.3 10*3/uL (ref 0.7–4.0)
MCH: 26.9 pg (ref 26.0–34.0)
MCHC: 32.6 g/dL (ref 30.0–36.0)
MCV: 82.4 fL (ref 80.0–100.0)
Monocytes Absolute: 0.8 10*3/uL (ref 0.1–1.0)
Monocytes Relative: 15 %
Neutro Abs: 3.4 10*3/uL (ref 1.7–7.7)
Neutrophils Relative %: 60 %
Platelets: 200 10*3/uL (ref 150–400)
RBC: 4.54 MIL/uL (ref 3.87–5.11)
RDW: 14.4 % (ref 11.5–15.5)
WBC: 5.6 10*3/uL (ref 4.0–10.5)
nRBC: 0 % (ref 0.0–0.2)

## 2021-01-21 LAB — BASIC METABOLIC PANEL - CANCER CENTER ONLY
Anion gap: 7 (ref 5–15)
BUN: 16 mg/dL (ref 8–23)
CO2: 29 mmol/L (ref 22–32)
Calcium: 9.9 mg/dL (ref 8.9–10.3)
Chloride: 104 mmol/L (ref 98–111)
Creatinine: 0.99 mg/dL (ref 0.44–1.00)
GFR, Estimated: 58 mL/min — ABNORMAL LOW (ref >60)
Glucose, Bld: 186 mg/dL — ABNORMAL HIGH (ref 70–99)
Potassium: 4 mmol/L (ref 3.5–5.1)
Sodium: 140 mmol/L (ref 135–145)

## 2021-01-21 NOTE — Progress Notes (Signed)
°Morgan Yu   °[phone] [phone]   °Clinic Follow up Note  ° °Patient Care Team: °Patient, [name] as PCP - General (General Practice) °Yu, Praveena, MD as Consulting Physician (Hematology and Oncology) °01/21/2021 ° °CHIEF COMPLAINT: Stage IV left breast cancer, ER+ / PR- / Her2+, grade 3 ° °SUMMARY OF ONCOLOGIC HISTORY: °Oncology History  °Bone metastases (HCC)  °02/27/2020 Initial Diagnosis  ° Bone metastases (HCC) °  °Carcinoma of breast metastatic to bone (HCC)  °03/19/2020 Initial Diagnosis  ° Carcinoma of breast metastatic to bone (HCC) °  °03/24/2020 -  Chemotherapy  ° Patient is on Treatment Plan : BREAST Trastuzumab q21d  °   °04/15/2020 Cancer Staging  ° Staging form: Breast, AJCC 8th Edition °- Clinical stage from 04/15/2020: Stage IV (cTX, cNX, cM1, GX, ER+, PR-, HER2+) - Signed by Yu, Praveena, MD on 04/15/2020 °Stage prefix: Initial diagnosis °Histologic grading system: 3 grade system ° °  ° ° °CURRENT THERAPY:  °Anastrozole 1 mg p.o. daily °Herceptin/Kanjinti q3 weeks °Zometa q3 months ° °INTERVAL HISTORY: Morgan Yu returns for follow-up and treatment as scheduled.  She was last seen by Morgan Yu, our NP. °Since last visit, she continues to do well.  No complaints.  No lower extremity swelling.  Left upper extremity swelling is stable and she will be starting physical therapy starting January 2023.  No shortness of breath.  She is staying active, gained about 3 more pounds since last visit.  She apparently drove to the beach with her dog and stayed there for a week and had some relaxation time.  Overall she is not having any clinical concerns at this time.  No new bone pains °Rest of the pertinent 10 point ROS reviewed and negative. ° ° °MEDICAL HISTORY:  °Past Medical History:  °Diagnosis Date  ° Breast cancer (HCC)   ° with bone metastasis  ° ° °SURGICAL HISTORY: °Past Surgical History:  °Procedure Laterality Date  ° IR PERC PLEURAL DRAIN  W/INDWELL CATH W/IMG GUIDE  06/03/2020  ° IR RADIOLOGIST EVAL & MGMT  07/31/2020  ° IR REMOVAL OF PLURAL CATH W/CUFF  10/31/2020  ° ° °I have reviewed the social history and family history with the patient and they are unchanged from previous note. ° °ALLERGIES:  is allergic to vicodin hp [hydrocodone-acetaminophen]. ° °MEDICATIONS:  °Current Outpatient Medications  °Medication Sig Dispense Refill  ° anastrozole (ARIMIDEX) 1 MG tablet Take 1 tablet (1 mg total) by mouth daily. 30 tablet 11  ° cetirizine (ZYRTEC) 10 MG tablet Take 10 mg by mouth daily.    ° furosemide (LASIX) 20 MG tablet Take 1 tablet (20 mg total) by mouth daily. 30 tablet 0  ° oxymetazoline (AFRIN) 0.05 % nasal spray Place 1 spray into both nostrils daily as needed for congestion.    ° acetaminophen (TYLENOL) 500 MG tablet Take 500 mg by mouth every 6 (six) hours as needed for moderate pain. (Patient not taking: Reported on 10/21/2020)    ° benzonatate (TESSALON) 100 MG capsule Take 1 capsule (100 mg total) by mouth 3 (three) times daily. (Patient not taking: Reported on 10/21/2020) 20 capsule 0  ° Chlorphen-Pseudoephed-APAP (TYLENOL ALLERGY SINUS PO) Take 1 tablet by mouth daily as needed (allergy). (Patient not taking: Reported on 10/21/2020)    ° chlorpheniramine-HYDROcodone (TUSSIONEX) 10-8 MG/5ML SUER Take 5 mLs by mouth every 6 (six) hours as needed for cough (If unresponsive to Robitussin DM). (Patient not taking: Reported on 10/21/2020) 140 mL 0  °   feeding supplement (ENSURE ENLIVE / ENSURE PLUS) LIQD Take 237 mLs by mouth 3 (three) times daily between meals. (Patient not taking: Reported on 10/21/2020) 237 mL 12  ° guaiFENesin (MUCINEX) 600 MG 12 hr tablet Take 1 tablet (600 mg total) by mouth 2 (two) times daily. (Patient not taking: Reported on 11/11/2020) 60 tablet 0  ° LORazepam (ATIVAN) 0.5 MG tablet Take 1 tablet (0.5 mg total) by mouth every 8 (eight) hours as needed for anxiety. Take 1 hour prior to radiation therapy (Patient not taking:  Reported on 11/11/2020) 30 tablet 0  ° ondansetron (ZOFRAN) 4 MG tablet Take 1 tablet (4 mg total) by mouth every 8 (eight) hours as needed for nausea or vomiting. (Patient not taking: Reported on 10/21/2020) 40 tablet 1  ° oxyCODONE (OXY IR/ROXICODONE) 5 MG immediate release tablet Take 0.5-1 tablets (2.5-5 mg total) by mouth every 4 (four) hours as needed for severe pain (dyspnea). (Patient not taking: Reported on 10/21/2020) 15 tablet 0  ° senna (SENOKOT) 8.6 MG TABS tablet Take 8.6 mg by mouth daily as needed for mild constipation. (Patient not taking: Reported on 10/21/2020)    ° °No current facility-administered medications for this visit.  ° °Review of Systems  °Constitutional:  Negative for chills, fever, malaise/fatigue and weight loss.  °HENT:  Negative for sore throat.   °Respiratory:  Negative for cough, shortness of breath and wheezing.   °Cardiovascular:  Negative for chest pain, palpitations and leg swelling.  °Gastrointestinal:  Negative for abdominal pain, blood in stool, constipation, diarrhea, melena, nausea and vomiting.  °Musculoskeletal:  Negative for myalgias.  °Skin:  Negative for itching and rash.  ° ° °PHYSICAL EXAMINATION: °ECOG PERFORMANCE STATUS: 0 - Asymptomatic ° °Vitals:  ° 01/21/21 1215  °BP: (!) 187/81  °Pulse: (!) 101  °Resp: 18  °Temp: 98.1 °F (36.7 °C)  °SpO2: 97%  ° °Filed Weights  ° 01/21/21 1215  °Weight: 126 lb 3.2 oz (57.2 kg)  ° °Physical exam: °ECOG performance status: 0 °HENT: Normocephalic and atraumatic. No mucositis. Mouth is moist.  °CARDIO: Normal rate and regular rhythm. Normal heart sounds °PULM: Normal breath sounds. No wheezing, rales and rhonchi.  °ABD: Abdomen is flat, soft and nontender.  °MSK: LUE lymphedema with compression sleeve on. Normal range of motion. °SKIN: Warm and dry. °NEURO: No focal deficits. Alert and oriented x 3.  °BREAST: Left breast has stable skin changes with erythema and nodularity ° °LABORATORY DATA:  °I have reviewed the data as  listed °CBC Latest Ref Rng & Units 01/21/2021 12/31/2020 12/03/2020  °WBC 4.0 - 10.5 K/uL 5.6 6.2 6.2  °Hemoglobin 12.0 - 15.0 g/dL 12.2 12.9 12.6  °Hematocrit 36.0 - 46.0 % 37.4 39.0 37.6  °Platelets 150 - 400 K/uL 200 175 190  ° ° ° °CMP Latest Ref Rng & Units 01/21/2021 12/31/2020 12/03/2020  °Glucose 70 - 99 mg/dL 186(H) 118(H) 133(H)  °BUN 8 - 23 mg/dL 16 18 16  °Creatinine 0.44 - 1.00 mg/dL 0.99 0.80 0.83  °Sodium 135 - 145 mmol/L 140 138 143  °Potassium 3.5 - 5.1 mmol/L 4.0 3.8 3.9  °Chloride 98 - 111 mmol/L 104 103 106  °CO2 22 - 32 mmol/L 29 27 25  °Calcium 8.9 - 10.3 mg/dL 9.9 9.3 9.7  °Total Protein 6.5 - 8.1 g/dL - 7.4 -  °Total Bilirubin 0.3 - 1.2 mg/dL - 0.5 -  °Alkaline Phos 38 - 126 U/L - 89 -  °AST 15 - 41 U/L - 28 -  °ALT 0 -   44 U/L - 20 -  ° °Most recent echocardiogram in December 2022 showed left ventricular ejection fraction of 35 to 40% this is significantly different compared to the ejection fraction noticed before in September 2022.  Left ventricle demonstrates global hypokinesis.  Grade 1 diastolic dysfunction noted.  Right ventricular systolic function is normal.  No other major valvular abnormality noted. °Echo in September 2022 showed ejection fraction of 60 to 65%, normal left ventricular function. ° ° ° °RADIOGRAPHIC STUDIES: °I have personally reviewed the radiological images as listed and agreed with the findings in the report. °No results found.  ° °ASSESSMENT & PLAN: 80-year-old female ° °# Metastatic breast cancer  °-CT evidence of hepatic, osseous, pulmonary, and axillary nodal metastatic disease  °-Biopsy 02/29/2020 left axillary/chest wall mass confirmed metastatic carcinoma consistent with breast primary, ER +50% weak, PR negative, HER2 positive 3+.  She is currently on trastuzumab every 21 days started in February 2022, anastrozole 1 mg p.o. daily and Zometa for bone strengthening. °-Received palliative radiation to the left axilla from 05/28/2020-06/25/2020 °-Most recent  echocardiogram showed an ejection fraction drop of almost 20 to 25%.  We will hold trastuzumab at this time, she has already been referred to cardiology and she will be following up with them this week.  She will continue anastrozole and Zometa in the interim. °Most recent CT imaging showed essentially stable right upper lobe pulmonary nodules, slightly decreased size of enlarged left axillary lymph node, slightly decreased size of hepatic metastasis.  No new adenopathy.  Diffuse sclerotic skeletal metastasis.  No new disease. °Unfortunately we cannot resume any cardiotoxic treatment at this time until ejection fraction improves and until we receive further recommendations from cardiology.  She will continue with anastrozole daily and Zometa every 3 months, we will plan to repeat imaging in March 2023. °Proceed with cardiology follow-up tomorrow.  Return to clinic with us in 4 weeks. ° °#LUE Lymphedema: °-- She will be doing physical therapy thrice a week starting January. ° ° °All questions were answered. The patient knows to call the clinic with any problems, questions or concerns. No barriers to learning were detected. ° °I have spent a total of 30 minutes minutes of face-to-face and non-face-to-face time, preparing to see the patient, performing a medically appropriate examination, counseling and educating the patient, referring and communicating with other health care professionals, documenting clinical information in the electronic health record, and care coordination.  ° ° °Morgan Iruku, MD °Hematology and Oncology °[facility] [place] °P: [phone] °  °

## 2021-01-22 ENCOUNTER — Ambulatory Visit (HOSPITAL_COMMUNITY)
Admission: RE | Admit: 2021-01-22 | Discharge: 2021-01-22 | Disposition: A | Discharge status: Home / Self Care | Payer: Medicare HMO | Source: Ambulatory Visit | Attending: Internal Medicine | Admitting: Internal Medicine

## 2021-01-22 ENCOUNTER — Encounter: Payer: Self-pay | Admitting: Hematology and Oncology

## 2021-01-22 ENCOUNTER — Encounter (HOSPITAL_COMMUNITY): Payer: Self-pay | Admitting: Internal Medicine

## 2021-01-22 ENCOUNTER — Other Ambulatory Visit (HOSPITAL_COMMUNITY): Payer: Self-pay

## 2021-01-22 VITALS — BP 140/80 | HR 90 | Wt 125.2 lb

## 2021-01-22 DIAGNOSIS — T451X5A Adverse effect of antineoplastic and immunosuppressive drugs, initial encounter: Secondary | ICD-10-CM

## 2021-01-22 DIAGNOSIS — I700 Atherosclerosis of aorta: Secondary | ICD-10-CM | POA: Diagnosis not present

## 2021-01-22 DIAGNOSIS — I119 Hypertensive heart disease without heart failure: Secondary | ICD-10-CM | POA: Insufficient documentation

## 2021-01-22 DIAGNOSIS — C7951 Secondary malignant neoplasm of bone: Secondary | ICD-10-CM | POA: Insufficient documentation

## 2021-01-22 DIAGNOSIS — R0602 Shortness of breath: Secondary | ICD-10-CM | POA: Insufficient documentation

## 2021-01-22 DIAGNOSIS — Z170 Estrogen receptor positive status [ER+]: Secondary | ICD-10-CM | POA: Insufficient documentation

## 2021-01-22 DIAGNOSIS — I427 Cardiomyopathy due to drug and external agent: Secondary | ICD-10-CM | POA: Diagnosis not present

## 2021-01-22 DIAGNOSIS — Z01818 Encounter for other preprocedural examination: Secondary | ICD-10-CM | POA: Diagnosis not present

## 2021-01-22 DIAGNOSIS — I517 Cardiomegaly: Secondary | ICD-10-CM | POA: Diagnosis not present

## 2021-01-22 DIAGNOSIS — Z79899 Other long term (current) drug therapy: Secondary | ICD-10-CM | POA: Insufficient documentation

## 2021-01-22 DIAGNOSIS — I10 Essential (primary) hypertension: Secondary | ICD-10-CM

## 2021-01-22 DIAGNOSIS — C50912 Malignant neoplasm of unspecified site of left female breast: Secondary | ICD-10-CM | POA: Insufficient documentation

## 2021-01-22 MED ORDER — ENTRESTO 24-26 MG PO TABS: 1.0000 | ORAL_TABLET | Freq: Two times a day (BID) | ORAL | 3 refills | Status: DC

## 2021-01-22 MED ORDER — CARVEDILOL 3.125 MG PO TABS: 3.1250 mg | ORAL_TABLET | Freq: Two times a day (BID) | ORAL | 3 refills | Status: DC

## 2021-01-22 NOTE — Patient Instructions (Addendum)
Medication Changes:  Start Entresto 24/26 Twice daily  Start Carvedilol 3.125mg  Twice daily  Stop Herceptin   Lab Work:  none  Testing/Procedures:  Your physician has requested that you have an echocardiogram. Echocardiography is a painless test that uses sound waves to create images of your heart. It provides your doctor with information about the size and shape of your heart and how well your hearts chambers and valves are working. This procedure takes approximately one hour. There are no restrictions for this procedure.   Referrals:  none  Special Instructions // Education:  none  Follow-Up in: 2 months with Echocardiogram   At the [facility], you and your health needs are our priority. We have a designated team specialized in the treatment of Heart Failure. This Care Team includes your primary Heart Failure Specialized Cardiologist (physician), Advanced Practice Providers (APPs- Physician Assistants and Nurse Practitioners), and Pharmacist who all work together to provide you with the care you need, when you need it.   You may see any of the following providers on your designated Care Team at your next follow up:  Dr Glori Bickers Dr Haynes Kerns, NP Lyda Jester, [place] [facility] [place], [place] Audry Riles, PharmD   Please be sure to bring in all your medications bottles to every appointment.   Need to Contact us:  If you have any questions or concerns before your next appointment please send [place] a message through [place] or call our office at [phone].    TO LEAVE A MESSAGE FOR THE NURSE SELECT OPTION 2, PLEASE LEAVE A MESSAGE INCLUDING: YOUR NAME DATE OF BIRTH CALL BACK NUMBER REASON FOR CALL**this is important as we prioritize the call backs  YOU WILL RECEIVE A CALL BACK THE SAME DAY AS LONG AS YOU CALL BEFORE 4:00 PM

## 2021-01-22 NOTE — Progress Notes (Signed)
CARDIO-ONCOLOGY CLINIC CONSULT NOTE  Referring Physician: Dr. Chryl Heck Primary Care: Patient, [name] Primary Cardiologist: New  HPI:  Morgan Yu is 80 y.o. female with h/o HTN (new diagnosis)  with stage IV left breast cancer referred by Dr. Chryl Heck for enrollment into the Cardio-Oncology program due to possible chemotherapy-induced cardiomyopathy.   Carcinoma of breast metastatic to bone ([place])  03/19/2020 Initial Diagnosis    Carcinoma of breast metastatic to bone ([place])    03/24/2020 -  Chemotherapy    Patient is on Treatment Plan : BREAST Trastuzumab q21d     04/15/2020 Cancer Staging    Staging form: Breast, AJCC 8th Edition - Clinical stage from 04/15/2020: Stage IV (cTX, cNX, cM1, GX, ER+, PR-, HER2+) - Signed by [name], MD on 04/15/2020 Stage prefix: Initial diagnosis Histologic grading system: 3 grade system       Has been very healthy. No real past medical issues. Diagnosed with left breast breast CA in 1/22 with mets to bone. Started on herceptin in 2/22 and arimidex. Also had XRT to left arm lymph nodes. Has been on Herceptin q3 weeks x March as monotherapy + Zometa  In 4/22 admitted with respiratory failure and LE edema. Found to have bilateral pleural effusions. Underwent thorcenteses and placement of Pleurex tube on left. Effusions negative for malignant cells. Pleurex removed in May. Remains on po lasix with resolution of LE edema. Hypoalbuminemia felt to be playing a role. Echo 05/28/20 EF 60-65%  Has been doing fairly well. Remains active. No CP or SOB. Ankle edema resolved with low-dose lasix. No recent URIs.    ECHO (01/15/21) EF 40-45% Echo 10/20/20 EF 60-65%  Echo 05/28/20 EF 60-65%    Review of Systems: [y] = yes, [ ]  = no   General: Weight gain [ ] ; Weight loss [ ] ; Anorexia [ ] ; Fatigue Blue.Reese ]; Fever [ ] ; Chills [ ] ; Weakness [ ]   Cardiac: Chest pain/pressure [ ] ; Resting SOB [ ] ; Exertional SOB [ ] ; Orthopnea [ ] ; Pedal Edema [ y];  Palpitations [ ] ; Syncope [ ] ; Presyncope [ ] ; Paroxysmal nocturnal dyspnea[ ]   Pulmonary: Cough [ ] ; Wheezing[ ] ; Hemoptysis[ ] ; Sputum [ ] ; Snoring [ ]   GI: Vomiting[ ] ; Dysphagia[ ] ; Melena[ ] ; Hematochezia [ ] ; Heartburn[ ] ; Abdominal pain [ ] ; Constipation [ ] ; Diarrhea [ ] ; BRBPR [ ]   GU: Hematuria[ ] ; Dysuria [ ] ; Nocturia[ ]   Vascular: Pain in legs with walking [ ] ; Pain in feet with lying flat [ ] ; Non-healing sores [ ] ; Stroke [ ] ; TIA [ ] ; Slurred speech [ ] ;  Neuro: Headaches[ ] ; Vertigo[ ] ; Seizures[ ] ; Paresthesias[ ] ;Blurred vision [ ] ; Diplopia [ ] ; Vision changes [ ]   Ortho/Skin: Arthritis Blue.Reese ]; Joint pain [ y]; Muscle pain [ ] ; Joint swelling [ ] ; Back Pain [ ] ; Rash [ ]   Psych: Depression[ ] ; Anxiety[ ]   Heme: Bleeding problems [ ] ; Clotting disorders [ ] ; Anemia [ ]   Endocrine: Diabetes [ ] ; Thyroid dysfunction[ ]    Past Medical History:  Diagnosis Date   Breast cancer ([place])    with bone metastasis    Current Outpatient Medications  Medication Sig Dispense Refill   anastrozole (ARIMIDEX) 1 MG tablet Take 1 tablet (1 mg total) by mouth daily. 30 tablet 11   Chlorphen-Pseudoephed-APAP (TYLENOL ALLERGY SINUS PO) Take 1 tablet by mouth daily as needed (allergy).     furosemide (LASIX) 20 MG tablet Take 1 tablet (20 mg total) by mouth daily.  30 tablet 0   oxymetazoline (AFRIN) 0.05 % nasal spray Place 1 spray into both nostrils daily as needed for congestion.     No current facility-administered medications for this encounter.    Allergies  Allergen Reactions   Vicodin Hp [Hydrocodone-Acetaminophen]     Brain fog      Social History   Socioeconomic History   Marital status: Widowed    Spouse name: Not on file   Number of children: Not on file   Years of education: Not on file   Highest education level: Not on file  Occupational History   Not on file  Tobacco Use   Smoking status: Never   Smokeless tobacco: Never  Vaping Use   Vaping Use: Never used   Substance and Sexual Activity   Alcohol use: Not Currently   Drug use: Never   Sexual activity: Not on file  Other Topics Concern   Not on file  Social History Narrative   No biological children   Has 2 stepchildren   Social Determinants of Health   Financial Resource Strain: Not on file  Food Insecurity: Not on file  Transportation Needs: Not on file  Physical Activity: Not on file  Stress: Not on file  Social Connections: Not on file  Intimate Partner Violence: Not on file      Family History  Problem Relation Age of Onset   Stroke Mother    Heart attack Father    Cancer Brother    Esophageal cancer Maternal Aunt    Breast cancer Maternal Aunt     Vitals:   01/22/21 1013  BP: 140/80  Pulse: 90  SpO2: 96%  Weight: 56.8 kg (125 lb 3.2 oz)    PHYSICAL EXAM: General:  Well appearing. No respiratory difficulty HEENT: normal Neck: supple. JVP 7 Carotids 2+ bilat; no bruits. No lymphadenopathy or thryomegaly appreciated. Cor: PMI nondisplaced. Regular rate & rhythm. No rubs, gallops or murmurs. Lungs: clear mildly dull at bases Abdomen: soft, nontender, nondistended. No hepatosplenomegaly. No bruits or masses. Good bowel sounds. Extremities: no cyanosis, clubbing, rash, edema Neuro: alert & oriented x 3, cranial nerves grossly intact. moves all 4 extremities w/o difficulty. Affect pleasant.  ECG: NSR 76 No ST-T wave abnormalities.     ASSESSMENT & PLAN:  1. Chemotherapy-induced cardiomyopathy  - suspect she has herceptin-induced  cardiomyopathy - Explained incidence of Herceptin cardiotoxicity (~10%) and role of Cardio-oncology clinic at length. I explained that LV dysfunction is typically completely reversible with holding Herceptin but that once EF gets better we would want to consider rechallenging her to continue to provide aggressive treatment for her breast CA - Start Entresto 24/26 and carvedilol 3.125 bid for cardio-protection - Hold herceptin - Repeat  echo in 2 months - If EF not improving with holding Herceptin will need further w/u for other causes of CM  2. Carcinoma of left breast metastatic to bone (HCC) - diagnosesd2/22 ER positive, PR negative and HER2 amplified breast cancer currently on Arimidex and Herceptin  - plan as above   3. HTN - starting meds as above  4. Pleural effusions - s/p previous pleurex - will repeat CXR today   Glori Bickers, MD  10:53 AM

## 2021-01-27 ENCOUNTER — Telehealth: Payer: Self-pay

## 2021-01-27 NOTE — Telephone Encounter (Signed)
Pt called and Morgan Yu asking if she should continue Lasix since being prescribed Entresto. Per Dr Chryl Heck, pt should continue Lasix sparingly as it could make pt hypotensive. Morgan Yu for pt to return call so we can give her suggestion.

## 2021-02-02 DIAGNOSIS — E261 Secondary hyperaldosteronism: Secondary | ICD-10-CM | POA: Diagnosis not present

## 2021-02-02 DIAGNOSIS — C50919 Malignant neoplasm of unspecified site of unspecified female breast: Secondary | ICD-10-CM | POA: Diagnosis not present

## 2021-02-02 DIAGNOSIS — Z7983 Long term (current) use of bisphosphonates: Secondary | ICD-10-CM | POA: Diagnosis not present

## 2021-02-02 DIAGNOSIS — M19042 Primary osteoarthritis, left hand: Secondary | ICD-10-CM | POA: Diagnosis not present

## 2021-02-02 DIAGNOSIS — I509 Heart failure, unspecified: Secondary | ICD-10-CM | POA: Diagnosis not present

## 2021-02-02 DIAGNOSIS — I110 Hypertensive heart disease with heart failure: Secondary | ICD-10-CM | POA: Diagnosis not present

## 2021-02-02 DIAGNOSIS — M810 Age-related osteoporosis without current pathological fracture: Secondary | ICD-10-CM | POA: Diagnosis not present

## 2021-02-02 DIAGNOSIS — G63 Polyneuropathy in diseases classified elsewhere: Secondary | ICD-10-CM | POA: Diagnosis not present

## 2021-02-02 DIAGNOSIS — Z79811 Long term (current) use of aromatase inhibitors: Secondary | ICD-10-CM | POA: Diagnosis not present

## 2021-02-02 DIAGNOSIS — M19041 Primary osteoarthritis, right hand: Secondary | ICD-10-CM | POA: Diagnosis not present

## 2021-02-02 DIAGNOSIS — C799 Secondary malignant neoplasm of unspecified site: Secondary | ICD-10-CM | POA: Diagnosis not present

## 2021-02-05 DIAGNOSIS — J90 Pleural effusion, not elsewhere classified: Secondary | ICD-10-CM | POA: Diagnosis not present

## 2021-02-05 DIAGNOSIS — J9601 Acute respiratory failure with hypoxia: Secondary | ICD-10-CM | POA: Diagnosis not present

## 2021-02-05 DIAGNOSIS — C799 Secondary malignant neoplasm of unspecified site: Secondary | ICD-10-CM | POA: Diagnosis not present

## 2021-02-09 ENCOUNTER — Encounter: Payer: Self-pay | Admitting: Physical Therapy

## 2021-02-09 ENCOUNTER — Ambulatory Visit: Payer: Medicare HMO | Attending: Hematology and Oncology | Admitting: Physical Therapy

## 2021-02-09 ENCOUNTER — Other Ambulatory Visit: Payer: Self-pay

## 2021-02-09 DIAGNOSIS — C50912 Malignant neoplasm of unspecified site of left female breast: Secondary | ICD-10-CM | POA: Diagnosis not present

## 2021-02-09 DIAGNOSIS — Z170 Estrogen receptor positive status [ER+]: Secondary | ICD-10-CM | POA: Insufficient documentation

## 2021-02-09 DIAGNOSIS — I890 Lymphedema, not elsewhere classified: Secondary | ICD-10-CM | POA: Insufficient documentation

## 2021-02-09 DIAGNOSIS — M25612 Stiffness of left shoulder, not elsewhere classified: Secondary | ICD-10-CM | POA: Diagnosis not present

## 2021-02-09 NOTE — Therapy (Signed)
[facility] @ [place], [place], [address] Phone: [phone]   Fax:  [phone]  Physical Therapy Treatment  Patient Details  Name: Morgan Yu MRN: [id number] Date of Birth: 09/04/1940 Referring Provider (PT): Iruku   Encounter Date: 02/09/2021   PT End of Session - 02/09/21 1102     Visit Number 2    Number of Visits 19    Date for PT Re-Evaluation 03/18/21    PT Start Time 1000    PT Stop Time 1100    PT Time Calculation (min) 60 min    Activity Tolerance Patient tolerated treatment well    Behavior During Therapy [facility] for tasks assessed/performed             Past Medical History:  Diagnosis Date   Breast cancer ([place])    with bone metastasis    Past Surgical History:  Procedure Laterality Date   IR PERC PLEURAL DRAIN W/INDWELL CATH W/IMG GUIDE  06/03/2020   IR RADIOLOGIST EVAL & MGMT  07/31/2020   IR REMOVAL OF PLURAL CATH W/CUFF  10/31/2020    There were no vitals filed for this visit.   Subjective Assessment - 02/09/21 1001     Subjective I am worn out. The last 3 weeks I have had something to do everyday.    Pertinent History breast cancer with mets, hepatic, osseous, pulmonary, and axillary nodal metastasis, axillary chest wall mets, biopsy 02/29/20, ER+ PR- HER2+, pt was receiving radiation in April 2022 and had to stop due to anxiety from fluid build up, pt had a drain placed to drain fluid from abdominal cavity from low protein, pt taking herceptin, drain has been out since June 26th, pt has had lymphedema in LUE since Jan 2021 when she was diagnosed    Patient Stated Goals to get arm swelling down    Currently in Pain? No/denies    Pain Score 0-No pain                               [facility] Adult PT Treatment/Exercise - 02/09/21 0001       Manual Therapy   Manual Therapy Manual Lymphatic Drainage (MLD);Compression Bandaging    Manual Lymphatic Drainage (MLD) in  supine while educating pt on anatomy and physiology of the lymphatic system: short neck, 5 diaphragmatic breaths, right axillary nodes and establishment of interaxillary pathway, left inguinal nodes and establishment of axillo inguinal pathway, LUE working proximal to distal then retracing all steps    Compression Bandaging lotion applied from hand to axilla, thick stockinette from hand to axilla, elatomull to digits 1-5, artiflex from wrist to axilla, 1 6 cm bandage at hand, 1 8 cm in herringbone from wrist to axilla, 1 10cm from wrist to axilla, applied paper tape over elastomull to help keep it from snagging                          PT Long Term Goals - 01/14/21 1410       PT LONG TERM GOAL #1   Title Pt will demonstrate a 5 cm decrease in edema 15 cm proximal to ulnar styloid process to decrease risk of infection.    Baseline 29 cm    Time 6    Period Weeks    Status New    Target Date 03/18/21      PT  LONG TERM GOAL #2   Title Pt will receive trial of FLexiTouch compression pump for long term management of lymphedema    Time 6   on hold until Jan 9 due to scheduling   Period Weeks    Status New    Target Date 03/18/21   on hold until Jan 9 due to scheduling     PT [place] #3   Title Pt will be independent in self MLD for long term management of lymphedema.    Time 6    Period Weeks    Status New    Target Date 03/18/21   on hold until Jan 9 due to scheduling     PT LONG TERM GOAL #4   Title Pt will receive an appropriate compression garment for long term management of lymphedema.    Time 6    Period Weeks    Status New    Target Date 03/18/21   on hold until Jan 9 due to scheduling     PT LONG TERM GOAL #5   Title Pt will demonstrate 120 degrees of L shoulder abduction to allow pt to reach out to the side.    Baseline 64    Time 6    Period Weeks    Status New    Target Date 03/18/21   on hold until Jan 9 due to scheduling     Additional Long  Term Goals   Additional Long Term Goals Yes      PT LONG TERM GOAL #6   Title Pt will be independent in a home exercise program for continued stretching and strengthening.    Time 6    Period Weeks    Status New    Target Date 03/18/21                   Plan - 02/09/21 1102     Clinical Impression Statement Began MLD to LUE while educating pt throughout on anatomy and physiology of the lymphatic system and correct technique. Then applied compression bandaging to LUE from fingers to axilla. Educated pt to remove bandages if they become loose and slide down, if they become uncomfortable/painful or if her fingers turn numb or blue and this does not improve when she moves her arm. Next session may add foam if bandages slide down.    PT Frequency 3x / week    PT Duration 6 weeks    PT Treatment/Interventions ADLs/Self Care Home Management;Patient/family education;Therapeutic exercise;Manual techniques;Manual lymph drainage;Compression bandaging;Orthotic Fit/Training;Passive range of motion;Vasopneumatic Device    PT Next Visit Plan give info on flexi and see if pt is interested on checking benefits, cont complete CDT **pt has axillary mets so it may not workScientist, forensic for garments once pt demonstrates maximal reduction, gentle PROM and AAROM exercises for L shoulder    Consulted and Agree with Plan of Care Patient             Patient will benefit from skilled therapeutic intervention in order to improve the following deficits and impairments:  Postural dysfunction, Impaired UE functional use, Increased fascial restricitons, Decreased strength, Decreased range of motion, Increased edema  Visit Diagnosis: Lymphedema, not elsewhere classified  Stiffness of left shoulder, not elsewhere classified  Malignant neoplasm of left breast in female, estrogen receptor positive, unspecified site of breast [facility])     Problem List Patient Active Problem List   Diagnosis Date Noted    SOB (shortness of breath) 01/22/2021  HTN (hypertension) 11/11/2020   Hospice care patient 06/05/2020   Acute hypoxemic respiratory failure ([place]) 05/28/2020   Lymphedema 05/27/2020   Bilateral lower extremity edema 05/27/2020   Malignant pleural effusion 05/27/2020   Anxiety state 05/21/2020   Carcinoma of breast metastatic to bone ([place]) 03/19/2020   Protein-calorie malnutrition, severe 03/04/2020   Pressure injury of skin 02/28/2020   Bone metastases ([place]) 02/27/2020   Elevated LFTs 02/27/2020   Elevated troponin 02/27/2020   Pleural effusion 02/27/2020   Breast mass, left 02/27/2020   Sinus tachycardia 02/27/2020    [name] [place], PT 02/09/2021, 11:07 AM  [facility] @ [place], [place], [address] Phone: [phone]   Fax:  [phone]  Name: YASAMAN KOLEK MRN: [id number] Date of Birth: [birth year]

## 2021-02-11 ENCOUNTER — Ambulatory Visit: Payer: Medicare HMO | Admitting: Physical Therapy

## 2021-02-11 ENCOUNTER — Other Ambulatory Visit: Payer: Self-pay

## 2021-02-11 ENCOUNTER — Encounter: Payer: Self-pay | Admitting: Physical Therapy

## 2021-02-11 DIAGNOSIS — C50912 Malignant neoplasm of unspecified site of left female breast: Secondary | ICD-10-CM | POA: Diagnosis not present

## 2021-02-11 DIAGNOSIS — M25612 Stiffness of left shoulder, not elsewhere classified: Secondary | ICD-10-CM | POA: Diagnosis not present

## 2021-02-11 DIAGNOSIS — I890 Lymphedema, not elsewhere classified: Secondary | ICD-10-CM

## 2021-02-11 DIAGNOSIS — Z170 Estrogen receptor positive status [ER+]: Secondary | ICD-10-CM | POA: Diagnosis not present

## 2021-02-11 NOTE — Therapy (Signed)
[facility] @ [place], [place], [address] Phone: [phone]   Fax:  [phone]  Physical Therapy Treatment  Patient Details  Name: Morgan Yu MRN: [id number] Date of Birth: 20-Mar-1940 Referring Provider (PT): Iruku   Encounter Date: 02/11/2021   PT End of Session - 02/11/21 1100     Visit Number 3    Number of Visits 19    Date for PT Re-Evaluation 03/18/21    PT Start Time 7341    PT Stop Time 1057    PT Time Calculation (min) 62 min             Past Medical History:  Diagnosis Date   Breast cancer ([place])    with bone metastasis    Past Surgical History:  Procedure Laterality Date   IR PERC PLEURAL DRAIN W/INDWELL CATH W/IMG GUIDE  06/03/2020   IR RADIOLOGIST EVAL & MGMT  07/31/2020   IR REMOVAL OF PLURAL CATH W/CUFF  10/31/2020    There were no vitals filed for this visit.   Subjective Assessment - 02/11/21 0955     Subjective I have not had any problems with the bandages. I have not had any pain.    Pertinent History breast cancer with mets, hepatic, osseous, pulmonary, and axillary nodal metastasis, axillary chest wall mets, biopsy 02/29/20, ER+ PR- HER2+, pt was receiving radiation in April 2022 and had to stop due to anxiety from fluid build up, pt had a drain placed to drain fluid from abdominal cavity from low protein, pt taking herceptin, drain has been out since June 26th, pt has had lymphedema in LUE since Jan 2021 when she was diagnosed    Patient Stated Goals to get arm swelling down    Currently in Pain? No/denies    Pain Score 0-No pain                               [facility] Adult PT Treatment/Exercise - 02/11/21 0001       Manual Therapy   Manual Lymphatic Drainage (MLD) in supine : short neck, 5 diaphragmatic breaths, right axillary nodes and establishment of interaxillary pathway, left inguinal nodes and establishment of axillo inguinal pathway, LUE working  proximal to distal then retracing all steps    Compression Bandaging lotion applied from hand to axilla, thick stockinette from hand to axilla, elatomull to digits 1-5, artiflex from wrist to axilla, 1 6 cm bandage at hand, 1 8 cm in herringbone from wrist to axilla, 1 10cm from wrist to axilla, applied paper tape over elastomull to help keep it from snagging                          PT Long Term Goals - 01/14/21 1410       PT LONG TERM GOAL #1   Title Pt will demonstrate a 5 cm decrease in edema 15 cm proximal to ulnar styloid process to decrease risk of infection.    Baseline 29 cm    Time 6    Period Weeks    Status New    Target Date 03/18/21      PT LONG TERM GOAL #2   Title Pt will receive trial of FLexiTouch compression pump for long term management of lymphedema    Time 6   on hold until Jan 9 due to scheduling   Period  Weeks    Status New    Target Date 03/18/21   on hold until Jan 9 due to scheduling     PT [place] #3   Title Pt will be independent in self MLD for long term management of lymphedema.    Time 6    Period Weeks    Status New    Target Date 03/18/21   on hold until Jan 9 due to scheduling     PT LONG TERM GOAL #4   Title Pt will receive an appropriate compression garment for long term management of lymphedema.    Time 6    Period Weeks    Status New    Target Date 03/18/21   on hold until Jan 9 due to scheduling     PT LONG TERM GOAL #5   Title Pt will demonstrate 120 degrees of L shoulder abduction to allow pt to reach out to the side.    Baseline 64    Time 6    Period Weeks    Status New    Target Date 03/18/21   on hold until Jan 9 due to scheduling     Additional Long Term Goals   Additional Long Term Goals Yes      PT LONG TERM GOAL #6   Title Pt will be independent in a home exercise program for continued stretching and strengthening.    Time 6    Period Weeks    Status New    Target Date 03/18/21                    Plan - 02/11/21 1101     Clinical Impression Statement Removed bandages today and pt's LUE was noticeable reduced with improved skin mobility. Pt washed her arm with soap and water then therapist performed MLD to LUE. Pt's bandages remained intact since last session and she did not have difficulty with them sliding down so reapplied them in the same fashion at end of session. Educated pt that she can shower right before her session on Friday.    PT Frequency 3x / week    PT Duration 6 weeks    PT Treatment/Interventions ADLs/Self Care Home Management;Patient/family education;Therapeutic exercise;Manual techniques;Manual lymph drainage;Compression bandaging;Orthotic Fit/Training;Passive range of motion;Vasopneumatic Device    PT Next Visit Plan give info on flexi and see if pt is interested on checking benefits, cont complete CDT **pt has axillary mets so it may not workScientist, forensic for garments once pt demonstrates maximal reduction, gentle PROM and AAROM exercises for L shoulder    Consulted and Agree with Plan of Care Patient             Patient will benefit from skilled therapeutic intervention in order to improve the following deficits and impairments:  Postural dysfunction, Impaired UE functional use, Increased fascial restricitons, Decreased strength, Decreased range of motion, Increased edema  Visit Diagnosis: Lymphedema, not elsewhere classified  Stiffness of left shoulder, not elsewhere classified  Malignant neoplasm of left breast in female, estrogen receptor positive, unspecified site of breast [facility])     Problem List Patient Active Problem List   Diagnosis Date Noted   SOB (shortness of breath) 01/22/2021   HTN (hypertension) 11/11/2020   Hospice care patient 06/05/2020   Acute hypoxemic respiratory failure ([place]) 05/28/2020   Lymphedema 05/27/2020   Bilateral lower extremity edema 05/27/2020   Malignant pleural effusion 05/27/2020   Anxiety state  05/21/2020   Carcinoma of  breast metastatic to bone ([place]) 03/19/2020   Protein-calorie malnutrition, severe 03/04/2020   Pressure injury of skin 02/28/2020   Bone metastases ([place]) 02/27/2020   Elevated LFTs 02/27/2020   Elevated troponin 02/27/2020   Pleural effusion 02/27/2020   Breast mass, left 02/27/2020   Sinus tachycardia 02/27/2020    [name] [place], PT 02/11/2021, 11:03 AM  [facility] @ [place], [place], [address] Phone: [phone]   Fax:  [phone]  Name: Morgan Yu MRN: [id number] Date of Birth: 10/08/1940   Manus Gunning, PT 02/11/21 11:03 AM

## 2021-02-12 ENCOUNTER — Encounter: Payer: Self-pay | Admitting: Physical Therapy

## 2021-02-12 ENCOUNTER — Ambulatory Visit: Payer: Medicare HMO | Admitting: Physical Therapy

## 2021-02-12 DIAGNOSIS — C50912 Malignant neoplasm of unspecified site of left female breast: Secondary | ICD-10-CM | POA: Diagnosis not present

## 2021-02-12 DIAGNOSIS — I890 Lymphedema, not elsewhere classified: Secondary | ICD-10-CM | POA: Diagnosis not present

## 2021-02-12 DIAGNOSIS — M25612 Stiffness of left shoulder, not elsewhere classified: Secondary | ICD-10-CM

## 2021-02-12 DIAGNOSIS — Z170 Estrogen receptor positive status [ER+]: Secondary | ICD-10-CM | POA: Diagnosis not present

## 2021-02-12 NOTE — Therapy (Signed)
[facility] @ [place], [place], [address] Phone: [phone]   Fax:  [phone]  Physical Therapy Treatment  Patient Details  Name: Morgan Yu MRN: [id number] Date of Birth: 22-Aug-1940 Referring Provider (PT): Iruku   Encounter Date: 02/12/2021   PT End of Session - 02/12/21 1156     Visit Number 4    Number of Visits 19    Date for PT Re-Evaluation 03/18/21    PT Start Time 1104    PT Stop Time 1152    PT Time Calculation (min) 48 min    Activity Tolerance Patient tolerated treatment well    Behavior During Therapy [facility] for tasks assessed/performed             Past Medical History:  Diagnosis Date   Breast cancer ([place])    with bone metastasis    Past Surgical History:  Procedure Laterality Date   IR PERC PLEURAL DRAIN W/INDWELL CATH W/IMG GUIDE  06/03/2020   IR RADIOLOGIST EVAL & MGMT  07/31/2020   IR REMOVAL OF PLURAL CATH W/CUFF  10/31/2020    There were no vitals filed for this visit.   Subjective Assessment - 02/12/21 1104     Subjective The bandages were hurting so bad I had to take them off. They were hurting around my hand and my fingers.    Pertinent History breast cancer with mets, hepatic, osseous, pulmonary, and axillary nodal metastasis, axillary chest wall mets, biopsy 02/29/20, ER+ PR- HER2+, pt was receiving radiation in April 2022 and had to stop due to anxiety from fluid build up, pt had a drain placed to drain fluid from abdominal cavity from low protein, pt taking herceptin, drain has been out since June 26th, pt has had lymphedema in LUE since Jan 2021 when she was diagnosed    Patient Stated Goals to get arm swelling down    Currently in Pain? No/denies    Pain Score 0-No pain                               [facility] Adult PT Treatment/Exercise - 02/12/21 0001       Manual Therapy   Manual Lymphatic Drainage (MLD) in supine : short neck, 5 diaphragmatic  breaths, right axillary nodes and establishment of interaxillary pathway, left inguinal nodes and establishment of axillo inguinal pathway, LUE working proximal to distal then retracing all steps    Compression Bandaging lotion applied from hand to axilla, thick stockinette from hand to axilla, elatomull to digits 1-5, artiflex from wrist to axilla, 1 6 cm bandage to hand and halfway up forearm, 1 8 cm in herringbone from wrist to axilla, 1 10cm from wrist to axilla, applied paper tape over elastomull to help keep it from snagging                          PT Long Term Goals - 01/14/21 1410       PT LONG TERM GOAL #1   Title Pt will demonstrate a 5 cm decrease in edema 15 cm proximal to ulnar styloid process to decrease risk of infection.    Baseline 29 cm    Time 6    Period Weeks    Status New    Target Date 03/18/21      PT LONG TERM GOAL #2   Title Pt will receive  trial of FLexiTouch compression pump for long term management of lymphedema    Time 6   on hold until Jan 9 due to scheduling   Period Weeks    Status New    Target Date 03/18/21   on hold until Jan 9 due to scheduling     PT [place] #3   Title Pt will be independent in self MLD for long term management of lymphedema.    Time 6    Period Weeks    Status New    Target Date 03/18/21   on hold until Jan 9 due to scheduling     PT LONG TERM GOAL #4   Title Pt will receive an appropriate compression garment for long term management of lymphedema.    Time 6    Period Weeks    Status New    Target Date 03/18/21   on hold until Jan 9 due to scheduling     PT LONG TERM GOAL #5   Title Pt will demonstrate 120 degrees of L shoulder abduction to allow pt to reach out to the side.    Baseline 64    Time 6    Period Weeks    Status New    Target Date 03/18/21   on hold until Jan 9 due to scheduling     Additional Long Term Goals   Additional Long Term Goals Yes      PT LONG TERM GOAL #6   Title  Pt will be independent in a home exercise program for continued stretching and strengthening.    Time 6    Period Weeks    Status New    Target Date 03/18/21                   Plan - 02/12/21 1157     Clinical Impression Statement Pt has pain last night with her bandages at her hand and had to remove them. It could have been caused by pt not speading fingers wide enough when hand bandages were applied. Applied less layers of bandages to hand to see if this helps decrease discomfort. Continued with MLD followed by compression bandaging. Pt's arm continues to demonstrate good reduction and improving skin mobility. She still has considerable fibrosis at anterior forearm.    PT Frequency 3x / week    PT Duration 6 weeks    PT Treatment/Interventions ADLs/Self Care Home Management;Patient/family education;Therapeutic exercise;Manual techniques;Manual lymph drainage;Compression bandaging;Orthotic Fit/Training;Passive range of motion;Vasopneumatic Device    PT Next Visit Plan give info on flexi and see if pt is interested on checking benefits, cont complete CDT **pt has axillary mets so it may not workScientist, forensic for garments once pt demonstrates maximal reduction, gentle PROM and AAROM exercises for L shoulder    Consulted and Agree with Plan of Care Patient             Patient will benefit from skilled therapeutic intervention in order to improve the following deficits and impairments:  Postural dysfunction, Impaired UE functional use, Increased fascial restricitons, Decreased strength, Decreased range of motion, Increased edema  Visit Diagnosis: Lymphedema, not elsewhere classified  Stiffness of left shoulder, not elsewhere classified  Malignant neoplasm of left breast in female, estrogen receptor positive, unspecified site of breast [facility])     Problem List Patient Active Problem List   Diagnosis Date Noted   SOB (shortness of breath) 01/22/2021   HTN (hypertension)  11/11/2020   Hospice care patient  06/05/2020   Acute hypoxemic respiratory failure ([place]) 05/28/2020   Lymphedema 05/27/2020   Bilateral lower extremity edema 05/27/2020   Malignant pleural effusion 05/27/2020   Anxiety state 05/21/2020   Carcinoma of breast metastatic to bone ([place]) 03/19/2020   Protein-calorie malnutrition, severe 03/04/2020   Pressure injury of skin 02/28/2020   Bone metastases ([place]) 02/27/2020   Elevated LFTs 02/27/2020   Elevated troponin 02/27/2020   Pleural effusion 02/27/2020   Breast mass, left 02/27/2020   Sinus tachycardia 02/27/2020    [name] [place], PT 02/12/2021, 11:59 AM  [facility] @ [place], [place], [address] Phone: [phone]   Fax:  [phone]  Name: Morgan Yu MRN: [id number] Date of Birth: 28-Nov-1940

## 2021-02-16 ENCOUNTER — Encounter: Payer: Self-pay | Admitting: Physical Therapy

## 2021-02-16 ENCOUNTER — Other Ambulatory Visit: Payer: Self-pay

## 2021-02-16 ENCOUNTER — Ambulatory Visit: Payer: Medicare HMO | Admitting: Physical Therapy

## 2021-02-16 DIAGNOSIS — I890 Lymphedema, not elsewhere classified: Secondary | ICD-10-CM

## 2021-02-16 DIAGNOSIS — M25612 Stiffness of left shoulder, not elsewhere classified: Secondary | ICD-10-CM

## 2021-02-16 DIAGNOSIS — C50912 Malignant neoplasm of unspecified site of left female breast: Secondary | ICD-10-CM

## 2021-02-16 DIAGNOSIS — Z170 Estrogen receptor positive status [ER+]: Secondary | ICD-10-CM

## 2021-02-16 NOTE — Therapy (Signed)
[facility] [place], [place], [address] Phone: [phone]   Fax:  [phone]  Physical Therapy Treatment  Patient Details  Name: Morgan Yu MRN: [id number] Date of Birth: 12-Mar-1940 Referring Provider (PT): Iruku   Encounter Date: 02/16/2021   PT End of Session - 02/16/21 1355     Visit Number 5    Number of Visits 19    Date for PT Re-Evaluation 03/18/21    PT Start Time 7711    PT Stop Time 1353    PT Time Calculation (min) 50 min    Activity Tolerance Patient tolerated treatment well    Behavior During Therapy [facility] for tasks assessed/performed             Past Medical History:  Diagnosis Date   Breast cancer ([place])    with bone metastasis    Past Surgical History:  Procedure Laterality Date   IR PERC PLEURAL DRAIN W/INDWELL CATH W/IMG GUIDE  06/03/2020   IR RADIOLOGIST EVAL & MGMT  07/31/2020   IR REMOVAL OF PLURAL CATH W/CUFF  10/31/2020    There were no vitals filed for this visit.   Subjective Assessment - 02/16/21 1303     Subjective I took my bandages off last night and took a shower. It looked real good when I took them off.    Pertinent History breast cancer with mets, hepatic, osseous, pulmonary, and axillary nodal metastasis, axillary chest wall mets, biopsy 02/29/20, ER+ PR- HER2+, pt was receiving radiation in April 2022 and had to stop due to anxiety from fluid build up, pt had a drain placed to drain fluid from abdominal cavity from low protein, pt taking herceptin, drain has been out since June 26th, pt has had lymphedema in LUE since Jan 2021 when she was diagnosed    Patient Stated Goals to get arm swelling down    Currently in Pain? No/denies    Pain Score 0-No pain                   LYMPHEDEMA/ONCOLOGY QUESTIONNAIRE - 02/16/21 0001       Left Upper Extremity Lymphedema   15 cm Proximal to Olecranon Process 29.5 cm    10 cm Proximal to Olecranon Process 29 cm     Olecranon Process 28 cm    15 cm Proximal to Ulnar Styloid Process 28.4 cm    10 cm Proximal to Ulnar Styloid Process 24.4 cm    Just Proximal to Ulnar Styloid Process 18.6 cm    Across Hand at [employer] 17.6 cm    At [place] of 2nd Digit 5.4 cm                        [facility] Adult PT Treatment/Exercise - 02/16/21 0001       Manual Therapy   Manual Lymphatic Drainage (MLD) in supine : short neck, 5 diaphragmatic breaths, right axillary nodes and establishment of interaxillary pathway, left inguinal nodes and establishment of axillo inguinal pathway, LUE working proximal to distal then retracing all steps    Compression Bandaging lotion applied from hand to axilla, thick stockinette from hand to axilla, elatomull to digits 1-5, artiflex from wrist to axilla, 1 6 cm bandage to hand and halfway up forearm, 1 8 cm in herringbone from wrist to axilla, 1 10cm from wrist to axilla,  PT Long Term Goals - 01/14/21 1410       PT LONG TERM GOAL #1   Title Pt will demonstrate a 5 cm decrease in edema 15 cm proximal to ulnar styloid process to decrease risk of infection.    Baseline 29 cm    Time 6    Period Weeks    Status New    Target Date 03/18/21      PT LONG TERM GOAL #2   Title Pt will receive trial of FLexiTouch compression pump for long term management of lymphedema    Time 6   on hold until Jan 9 due to scheduling   Period Weeks    Status New    Target Date 03/18/21   on hold until Jan 9 due to scheduling     PT [place] #3   Title Pt will be independent in self MLD for long term management of lymphedema.    Time 6    Period Weeks    Status New    Target Date 03/18/21   on hold until Jan 9 due to scheduling     PT LONG TERM GOAL #4   Title Pt will receive an appropriate compression garment for long term management of lymphedema.    Time 6    Period Weeks    Status New    Target Date 03/18/21   on hold until  Jan 9 due to scheduling     PT LONG TERM GOAL #5   Title Pt will demonstrate 120 degrees of L shoulder abduction to allow pt to reach out to the side.    Baseline 64    Time 6    Period Weeks    Status New    Target Date 03/18/21   on hold until Jan 9 due to scheduling     Additional Long Term Goals   Additional Long Term Goals Yes      PT LONG TERM GOAL #6   Title Pt will be independent in a home exercise program for continued stretching and strengthening.    Time 6    Period Weeks    Status New    Target Date 03/18/21                   Plan - 02/16/21 1356     Clinical Impression Statement Measured circumferences of pt's LUE today prior to beginning therapy and she demonstrates approximately a 2 cm reduction throughout. She has improved skin mobility and she had taken the bandages off to shower the night prior to her appointment. Continued with MLD and compression bandaging. She did not have any pain after the adjustments to the bandages last session so continued to bandage with less layers at the hand.    PT Frequency 3x / week    PT Duration 6 weeks    PT Treatment/Interventions ADLs/Self Care Home Management;Patient/family education;Therapeutic exercise;Manual techniques;Manual lymph drainage;Compression bandaging;Orthotic Fit/Training;Passive range of motion;Vasopneumatic Device    PT Next Visit Plan give info on flexi and see if pt is interested on checking benefits, cont complete CDT **pt has axillary mets so it may not workScientist, forensic for garments once pt demonstrates maximal reduction, gentle PROM and AAROM exercises for L shoulder    Consulted and Agree with Plan of Care Patient             Patient will benefit from skilled therapeutic intervention in order to improve the following deficits and impairments:  Postural dysfunction, Impaired UE functional use, Increased fascial restricitons, Decreased strength, Decreased range of motion, Increased  edema  Visit Diagnosis: Lymphedema, not elsewhere classified  Stiffness of left shoulder, not elsewhere classified  Malignant neoplasm of left breast in female, estrogen receptor positive, unspecified site of breast [facility])     Problem List Patient Active Problem List   Diagnosis Date Noted   SOB (shortness of breath) 01/22/2021   HTN (hypertension) 11/11/2020   Hospice care patient 06/05/2020   Acute hypoxemic respiratory failure ([place]) 05/28/2020   Lymphedema 05/27/2020   Bilateral lower extremity edema 05/27/2020   Malignant pleural effusion 05/27/2020   Anxiety state 05/21/2020   Carcinoma of breast metastatic to bone ([place]) 03/19/2020   Protein-calorie malnutrition, severe 03/04/2020   Pressure injury of skin 02/28/2020   Bone metastases ([place]) 02/27/2020   Elevated LFTs 02/27/2020   Elevated troponin 02/27/2020   Pleural effusion 02/27/2020   Breast mass, left 02/27/2020   Sinus tachycardia 02/27/2020    [name] [place], PT 02/16/2021, 1:58 PM  [facility] [place], [place], [address] Phone: [phone]   Fax:  [phone]  Name: TINISHA ETZKORN MRN: [id number] Date of Birth: 03-Jun-1940

## 2021-02-18 ENCOUNTER — Other Ambulatory Visit: Payer: Self-pay

## 2021-02-18 ENCOUNTER — Encounter: Payer: Self-pay | Admitting: Hematology and Oncology

## 2021-02-18 ENCOUNTER — Inpatient Hospital Stay: Payer: Medicare HMO | Attending: Hematology and Oncology

## 2021-02-18 ENCOUNTER — Ambulatory Visit: Payer: Medicare HMO | Admitting: Physical Therapy

## 2021-02-18 ENCOUNTER — Inpatient Hospital Stay: Payer: Medicare HMO | Admitting: Hematology and Oncology

## 2021-02-18 ENCOUNTER — Encounter: Payer: Self-pay | Admitting: Physical Therapy

## 2021-02-18 VITALS — BP 183/85 | HR 88 | Temp 97.6°F | Resp 18 | Ht 61.0 in | Wt 126.4 lb

## 2021-02-18 DIAGNOSIS — M25612 Stiffness of left shoulder, not elsewhere classified: Secondary | ICD-10-CM | POA: Diagnosis not present

## 2021-02-18 DIAGNOSIS — C7951 Secondary malignant neoplasm of bone: Secondary | ICD-10-CM | POA: Insufficient documentation

## 2021-02-18 DIAGNOSIS — Z170 Estrogen receptor positive status [ER+]: Secondary | ICD-10-CM | POA: Diagnosis not present

## 2021-02-18 DIAGNOSIS — C7800 Secondary malignant neoplasm of unspecified lung: Secondary | ICD-10-CM | POA: Insufficient documentation

## 2021-02-18 DIAGNOSIS — C773 Secondary and unspecified malignant neoplasm of axilla and upper limb lymph nodes: Secondary | ICD-10-CM | POA: Insufficient documentation

## 2021-02-18 DIAGNOSIS — Z923 Personal history of irradiation: Secondary | ICD-10-CM | POA: Insufficient documentation

## 2021-02-18 DIAGNOSIS — R9430 Abnormal result of cardiovascular function study, unspecified: Secondary | ICD-10-CM

## 2021-02-18 DIAGNOSIS — Z79811 Long term (current) use of aromatase inhibitors: Secondary | ICD-10-CM | POA: Diagnosis not present

## 2021-02-18 DIAGNOSIS — C50912 Malignant neoplasm of unspecified site of left female breast: Secondary | ICD-10-CM | POA: Diagnosis not present

## 2021-02-18 DIAGNOSIS — C787 Secondary malignant neoplasm of liver and intrahepatic bile duct: Secondary | ICD-10-CM | POA: Diagnosis not present

## 2021-02-18 DIAGNOSIS — C50919 Malignant neoplasm of unspecified site of unspecified female breast: Secondary | ICD-10-CM

## 2021-02-18 DIAGNOSIS — I890 Lymphedema, not elsewhere classified: Secondary | ICD-10-CM

## 2021-02-18 DIAGNOSIS — Z79899 Other long term (current) drug therapy: Secondary | ICD-10-CM | POA: Diagnosis not present

## 2021-02-18 LAB — CBC WITH DIFFERENTIAL/PLATELET
Abs Immature Granulocytes: 0.02 10*3/uL (ref 0.00–0.07)
Basophils Absolute: 0 10*3/uL (ref 0.0–0.1)
Basophils Relative: 0 %
Eosinophils Absolute: 0.1 10*3/uL (ref 0.0–0.5)
Eosinophils Relative: 1 %
HCT: 37.7 % (ref 36.0–46.0)
Hemoglobin: 12.7 g/dL (ref 12.0–15.0)
Immature Granulocytes: 0 %
Lymphocytes Relative: 20 %
Lymphs Abs: 1.4 10*3/uL (ref 0.7–4.0)
MCH: 26.7 pg (ref 26.0–34.0)
MCHC: 33.7 g/dL (ref 30.0–36.0)
MCV: 79.2 fL — ABNORMAL LOW (ref 80.0–100.0)
Monocytes Absolute: 0.9 10*3/uL (ref 0.1–1.0)
Monocytes Relative: 12 %
Neutro Abs: 4.8 10*3/uL (ref 1.7–7.7)
Neutrophils Relative %: 67 %
Platelets: 203 10*3/uL (ref 150–400)
RBC: 4.76 MIL/uL (ref 3.87–5.11)
RDW: 13.3 % (ref 11.5–15.5)
WBC: 7.3 10*3/uL (ref 4.0–10.5)
nRBC: 0 % (ref 0.0–0.2)

## 2021-02-18 LAB — BASIC METABOLIC PANEL - CANCER CENTER ONLY
Anion gap: 8 (ref 5–15)
BUN: 13 mg/dL (ref 8–23)
CO2: 28 mmol/L (ref 22–32)
Calcium: 9.8 mg/dL (ref 8.9–10.3)
Chloride: 104 mmol/L (ref 98–111)
Creatinine: 0.8 mg/dL (ref 0.44–1.00)
GFR, Estimated: >60 mL/min (ref >60)
Glucose, Bld: 154 mg/dL — ABNORMAL HIGH (ref 70–99)
Potassium: 4.1 mmol/L (ref 3.5–5.1)
Sodium: 140 mmol/L (ref 135–145)

## 2021-02-18 MED ORDER — FUROSEMIDE 20 MG PO TABS: 20.0000 mg | ORAL_TABLET | Freq: Every day | ORAL | 0 refills | Status: DC

## 2021-02-18 NOTE — Progress Notes (Signed)
[facility]   [phone] [phone]   Clinic Follow up Note   Patient Care Team: Patient, [name] as PCP - General ([place]) [name], MD as Consulting Physician (Hematology and Oncology) 02/18/2021  CHIEF COMPLAINT: Stage IV left breast cancer, ER+ / PR- / Her2+, grade 3  SUMMARY OF ONCOLOGIC HISTORY: Oncology History  Bone metastases ([place])  02/27/2020 Initial Diagnosis   Bone metastases ([place])   Carcinoma of breast metastatic to bone ([place])  03/19/2020 Initial Diagnosis   Carcinoma of breast metastatic to bone ([place])   03/24/2020 -  Chemotherapy   Patient is on Treatment Plan : BREAST Trastuzumab q21d     04/15/2020 Cancer Staging   Staging form: Breast, AJCC 8th Edition - Clinical stage from 04/15/2020: Stage IV (cTX, cNX, cM1, GX, ER+, PR-, HER2+) - Signed by [name], MD on 04/15/2020 Stage prefix: Initial diagnosis Histologic grading system: 3 grade system     CURRENT THERAPY:  Anastrozole 1 mg p.o. daily Herceptin/Kanjinti q3 weeks Zometa q3 months  INTERVAL HISTORY:   Ms. Halbur returns for follow-up. Since last visit, she continues to do well.  No complaints.  No lower extremity swelling. No change in breathing, no new bone pains. Weight is stable. No new neurological habits No change in bowel habits or urinary habits. She is volunteering at [employer]. She has a follow-up with cardiology next week.  She has another echocardiogram scheduled for mid February  She is hoping to get back on treatment.  In the interim she is compliant with the anastrozole. Rest of the pertinent 10 point ROS reviewed and negative.   MEDICAL HISTORY:  Past Medical History:  Diagnosis Date   Breast cancer [facility])    with bone metastasis    SURGICAL HISTORY: Past Surgical History:  Procedure Laterality Date   IR PERC PLEURAL DRAIN W/INDWELL CATH W/IMG GUIDE  06/03/2020   IR RADIOLOGIST EVAL & MGMT  07/31/2020   IR  REMOVAL OF PLURAL CATH W/CUFF  10/31/2020    I have reviewed the social history and family history with the patient and they are unchanged from previous note.  ALLERGIES:  is allergic to vicodin hp [hydrocodone-acetaminophen].  MEDICATIONS:  Current Outpatient Medications  Medication Sig Dispense Refill   anastrozole (ARIMIDEX) 1 MG tablet Take 1 tablet (1 mg total) by mouth daily. 30 tablet 11   carvedilol (COREG) 3.125 MG tablet Take 1 tablet (3.125 mg total) by mouth 2 (two) times daily. 180 tablet 3   Chlorphen-Pseudoephed-APAP (TYLENOL ALLERGY SINUS PO) Take 1 tablet by mouth daily as needed (allergy).     furosemide (LASIX) 20 MG tablet Take 1 tablet (20 mg total) by mouth daily. 30 tablet 0   oxymetazoline (AFRIN) 0.05 % nasal spray Place 1 spray into both nostrils daily as needed for congestion.     sacubitril-valsartan (ENTRESTO) 24-26 MG Take 1 tablet by mouth 2 (two) times daily. 180 tablet 3   No current facility-administered medications for this visit.   Review of Systems  Constitutional:  Negative for chills, fever, malaise/fatigue and weight loss.  HENT:  Negative for sore throat.   Respiratory:  Negative for cough, shortness of breath and wheezing.   Cardiovascular:  Negative for chest pain, palpitations and leg swelling.  Gastrointestinal:  Negative for abdominal pain, blood in stool, constipation, diarrhea, melena, nausea and vomiting.  Musculoskeletal:  Negative for myalgias.  Skin:  Negative for itching and rash.    PHYSICAL EXAMINATION: ECOG PERFORMANCE STATUS:  0 - Asymptomatic  Vitals:   02/18/21 1043  BP: (!) 183/85  Pulse: 88  Resp: 18  Temp: 97.6 F (36.4 C)  SpO2: 97%   Filed Weights   02/18/21 1043  Weight: 126 lb 6.4 oz (57.3 kg)   Physical exam: ECOG performance status: 0 HENT: Normocephalic and atraumatic. No mucositis. Mouth is moist.  CARDIO: Normal rate and regular rhythm. Normal heart sounds PULM: Normal breath sounds. No wheezing,  rales and rhonchi.  ABD: Abdomen is flat, soft and nontender.  MSK: LUE lymphedema with compression sleeve on. Normal range of motion. SKIN: Warm and dry. NEURO: No focal deficits. Alert and oriented x 3.  BREAST: Left breast has stable skin changes with erythema and nodularity Since last visit, no changes in physical exam.  LABORATORY DATA:  I have reviewed the data as listed CBC Latest Ref Rng & Units 02/18/2021 01/21/2021 12/31/2020  WBC 4.0 - 10.5 K/uL 7.3 5.6 6.2  Hemoglobin 12.0 - 15.0 g/dL 12.7 12.2 12.9  Hematocrit 36.0 - 46.0 % 37.7 37.4 39.0  Platelets 150 - 400 K/uL 203 200 175     CMP Latest Ref Rng & Units 02/18/2021 01/21/2021 12/31/2020  Glucose 70 - 99 mg/dL 154(H) 186(H) 118(H)  BUN 8 - 23 mg/dL 13 16 18   Creatinine 0.44 - 1.00 mg/dL 0.80 0.99 0.80  Sodium 135 - 145 mmol/L 140 140 138  Potassium 3.5 - 5.1 mmol/L 4.1 4.0 3.8  Chloride 98 - 111 mmol/L 104 104 103  CO2 22 - 32 mmol/L 28 29 27   Calcium 8.9 - 10.3 mg/dL 9.8 9.9 9.3  Total Protein 6.5 - 8.1 g/dL - - 7.4  Total Bilirubin 0.3 - 1.2 mg/dL - - 0.5  Alkaline Phos 38 - 126 U/L - - 89  AST 15 - 41 U/L - - 28  ALT 0 - 44 U/L - - 20   Most recent echocardiogram in December 2022 showed left ventricular ejection fraction of 35 to 40% this is significantly different compared to the ejection fraction noticed before in September 2022.  Left ventricle demonstrates global hypokinesis.  Grade 1 diastolic dysfunction noted.  Right ventricular systolic function is normal.  No other major valvular abnormality noted. Echo in September 2022 showed ejection fraction of 60 to 65%, normal left ventricular function.    RADIOGRAPHIC STUDIES: I have personally reviewed the radiological images as listed and agreed with the findings in the report. No results found.   ASSESSMENT & PLAN: 81 year old female  # Metastatic breast cancer  -CT evidence of hepatic, osseous, pulmonary, and axillary nodal metastatic disease  -Biopsy  02/29/2020 left axillary/chest wall mass confirmed metastatic carcinoma consistent with breast primary, ER +50% weak, PR negative, HER2 positive 3+.  She is currently on trastuzumab every 21 days started in February 2022, anastrozole 1 mg p.o. daily and Zometa for bone strengthening. -Received palliative radiation to the left axilla from 05/28/2020-06/25/2020 -Last echocardiogram showed an ejection fraction drop of almost 20 to 25%.  We held trastuzumab and referred her to cardiology for further recommendations.  Last CT imaging with no evidence of worsening disease.  Dr. Haroldine Laws has started her on carvedilol and Entresto for cardioprotection.  He is planning to repeat echocardiogram in mid February and will base his recommendations based on the improvement in echo.  Unfortunately we cannot resume any cardiotoxic treatment at this time until ejection fraction improves and until we receive further recommendations from cardiology.  She will continue with anastrozole daily and Zometa every 3  months, we will plan to repeat imaging in March 2023. No concern for disease progression on examination today. Proceed with cardiology follow-up as scheduled next week.  Return to clinic with [place] in 4 weeks.  #LUE Lymphedema: -- She will be doing physical therapy thrice a week starting January.   All questions were answered. The patient knows to call the clinic with any problems, questions or concerns. No barriers to learning were detected.  I have spent a total of 30 minutes minutes of face-to-face and non-face-to-face time, preparing to see the patient, performing a medically appropriate examination, counseling and educating the patient, referring and communicating with other health care professionals, documenting clinical information in the electronic health record, and care coordination.    [name], MD Hematology and [place] at [place]: [phone]

## 2021-02-18 NOTE — Therapy (Signed)
[facility] @ [place], [place], [address] Phone: [phone]   Fax:  [phone]  Physical Therapy Treatment  Patient Details  Name: Morgan Yu MRN: [id number] Date of Birth: 02/23/1940 Referring Provider (PT): Iruku   Encounter Date: 02/18/2021   PT End of Session - 02/18/21 1353     Visit Number 6    Number of Visits 19    Date for PT Re-Evaluation 03/18/21    PT Start Time 1302    PT Stop Time 1353    PT Time Calculation (min) 51 min    Activity Tolerance Patient tolerated treatment well    Behavior During Therapy [facility] for tasks assessed/performed             Past Medical History:  Diagnosis Date   Breast cancer ([place])    with bone metastasis    Past Surgical History:  Procedure Laterality Date   IR PERC PLEURAL DRAIN W/INDWELL CATH W/IMG GUIDE  06/03/2020   IR RADIOLOGIST EVAL & MGMT  07/31/2020   IR REMOVAL OF PLURAL CATH W/CUFF  10/31/2020    There were no vitals filed for this visit.   Subjective Assessment - 02/18/21 1302     Subjective If my heart looks ok on the ultrasound they might start back the herceptin. The bandages did ok after last time.    Pertinent History breast cancer with mets, hepatic, osseous, pulmonary, and axillary nodal metastasis, axillary chest wall mets, biopsy 02/29/20, ER+ PR- HER2+, pt was receiving radiation in April 2022 and had to stop due to anxiety from fluid build up, pt had a drain placed to drain fluid from abdominal cavity from low protein, pt taking herceptin, drain has been out since June 26th, pt has had lymphedema in LUE since Jan 2021 when she was diagnosed    Patient Stated Goals to get arm swelling down    Currently in Pain? No/denies    Pain Score 0-No pain                               [facility] Adult PT Treatment/Exercise - 02/18/21 0001       Manual Therapy   Manual Lymphatic Drainage (MLD) in supine : short neck, 5  diaphragmatic breaths, right axillary nodes and establishment of interaxillary pathway, left inguinal nodes and establishment of axillo inguinal pathway, LUE working proximal to distal then retracing all steps    Compression Bandaging lotion applied from hand to axilla, thick stockinette from hand to axilla, elatomull to digits 1-5 then covered in paper tape, artiflex from wrist to axilla, 1 6 cm bandage to hand and halfway up forearm, 1 8 cm in herringbone from wrist to axilla, 1 10cm from wrist to axilla,                          PT Long Term Goals - 01/14/21 1410       PT LONG TERM GOAL #1   Title Pt will demonstrate a 5 cm decrease in edema 15 cm proximal to ulnar styloid process to decrease risk of infection.    Baseline 29 cm    Time 6    Period Weeks    Status New    Target Date 03/18/21      PT LONG TERM GOAL #2   Title Pt will receive trial of FLexiTouch compression pump for  long term management of lymphedema    Time 6   on hold until Jan 9 due to scheduling   Period Weeks    Status New    Target Date 03/18/21   on hold until Jan 9 due to scheduling     PT [place] #3   Title Pt will be independent in self MLD for long term management of lymphedema.    Time 6    Period Weeks    Status New    Target Date 03/18/21   on hold until Jan 9 due to scheduling     PT LONG TERM GOAL #4   Title Pt will receive an appropriate compression garment for long term management of lymphedema.    Time 6    Period Weeks    Status New    Target Date 03/18/21   on hold until Jan 9 due to scheduling     PT LONG TERM GOAL #5   Title Pt will demonstrate 120 degrees of L shoulder abduction to allow pt to reach out to the side.    Baseline 64    Time 6    Period Weeks    Status New    Target Date 03/18/21   on hold until Jan 9 due to scheduling     Additional Long Term Goals   Additional Long Term Goals Yes      PT LONG TERM GOAL #6   Title Pt will be independent in  a home exercise program for continued stretching and strengthening.    Time 6    Period Weeks    Status New    Target Date 03/18/21                   Plan - 02/18/21 1354     Clinical Impression Statement Pt arrived to appointment with bandages intact. She continues to demontrate good reduction of edema throughout LUE per visual estimate. Continued with MLD and compression bandaging. Once she reaches maximal reduction, pt will be measured for a compression sleeve and night time garment.    PT Frequency 3x / week    PT Duration 6 weeks    PT Treatment/Interventions ADLs/Self Care Home Management;Patient/family education;Therapeutic exercise;Manual techniques;Manual lymph drainage;Compression bandaging;Orthotic Fit/Training;Passive range of motion;Vasopneumatic Device    PT Next Visit Plan give info on flexi and see if pt is interested on checking benefits, cont complete CDT, use alight for garments once pt demonstrates maximal reduction, gentle PROM and AAROM exercises for L shoulder    Consulted and Agree with Plan of Care Patient             Patient will benefit from skilled therapeutic intervention in order to improve the following deficits and impairments:  Postural dysfunction, Impaired UE functional use, Increased fascial restricitons, Decreased strength, Decreased range of motion, Increased edema  Visit Diagnosis: Lymphedema, not elsewhere classified  Stiffness of left shoulder, not elsewhere classified  Malignant neoplasm of left breast in female, estrogen receptor positive, unspecified site of breast [facility])     Problem List Patient Active Problem List   Diagnosis Date Noted   SOB (shortness of breath) 01/22/2021   HTN (hypertension) 11/11/2020   Hospice care patient 06/05/2020   Acute hypoxemic respiratory failure ([place]) 05/28/2020   Lymphedema 05/27/2020   Bilateral lower extremity edema 05/27/2020   Malignant pleural effusion 05/27/2020   Anxiety state  05/21/2020   Carcinoma of breast metastatic to bone ([place]) 03/19/2020   Protein-calorie malnutrition,  severe 03/04/2020   Pressure injury of skin 02/28/2020   Bone metastases ([place]) 02/27/2020   Elevated LFTs 02/27/2020   Elevated troponin 02/27/2020   Pleural effusion 02/27/2020   Breast mass, left 02/27/2020   Sinus tachycardia 02/27/2020    [name] [place], PT 02/18/2021, 1:56 PM  [facility] @ [place], [place], [address] Phone: [phone]   Fax:  [phone]  Name: DELORESE SELLIN MRN: [id number] Date of Birth: May 05, 1940

## 2021-02-20 ENCOUNTER — Encounter: Payer: Self-pay | Admitting: Physical Therapy

## 2021-02-20 ENCOUNTER — Other Ambulatory Visit: Payer: Self-pay

## 2021-02-20 ENCOUNTER — Ambulatory Visit: Payer: Medicare HMO | Admitting: Physical Therapy

## 2021-02-20 DIAGNOSIS — M25612 Stiffness of left shoulder, not elsewhere classified: Secondary | ICD-10-CM | POA: Diagnosis not present

## 2021-02-20 DIAGNOSIS — C50912 Malignant neoplasm of unspecified site of left female breast: Secondary | ICD-10-CM

## 2021-02-20 DIAGNOSIS — Z170 Estrogen receptor positive status [ER+]: Secondary | ICD-10-CM | POA: Diagnosis not present

## 2021-02-20 DIAGNOSIS — I890 Lymphedema, not elsewhere classified: Secondary | ICD-10-CM

## 2021-02-20 NOTE — Therapy (Signed)
[facility] @ [place] [place], [place], [address] Phone: [phone]   Fax:  [phone]  Physical Therapy Treatment  Patient Details  Name: Morgan Yu MRN: [id number] Date of Birth: 19-Jun-1940 Referring Provider (PT): Iruku   Encounter Date: 02/20/2021   PT End of Session - 02/20/21 1132     Visit Number 7    Number of Visits 19    Date for PT Re-Evaluation 03/18/21    PT Start Time 0900    PT Stop Time 0955    PT Time Calculation (min) 55 min    Activity Tolerance Patient tolerated treatment well    Behavior During Therapy [facility] for tasks assessed/performed             Past Medical History:  Diagnosis Date   Breast cancer ([place])    with bone metastasis    Past Surgical History:  Procedure Laterality Date   IR PERC PLEURAL DRAIN W/INDWELL CATH W/IMG GUIDE  06/03/2020   IR RADIOLOGIST EVAL & MGMT  07/31/2020   IR REMOVAL OF PLURAL CATH W/CUFF  10/31/2020    There were no vitals filed for this visit.   Subjective Assessment - 02/20/21 1127     Subjective Pt pleased that she is having such good reduction from the treatment. Pt asked to send demographics to [employer] to see if she can get insurance coverage    Pertinent History breast cancer with mets, hepatic, osseous, pulmonary, and axillary nodal metastasis, axillary chest wall mets, biopsy 02/29/20, ER+ PR- HER2+, pt was receiving radiation in April 2022 and had to stop due to anxiety from fluid build up, pt had a drain placed to drain fluid from abdominal cavity from low protein, pt taking herceptin, drain has been out since June 26th, pt has had lymphedema in LUE since Jan 2021 when she was diagnosed    Patient Stated Goals to get arm swelling down    Currently in Pain? No/denies                   LYMPHEDEMA/ONCOLOGY QUESTIONNAIRE - 02/20/21 0001       Left Upper Extremity Lymphedema   10 cm Proximal to Olecranon Process 28 cm    Olecranon  Process 25 cm    15 cm Proximal to Ulnar Styloid Process 25 cm    10 cm Proximal to Ulnar Styloid Process 22.5 cm    Just Proximal to Ulnar Styloid Process 16.5 cm    Across Hand at [employer] 16.6 cm    At [place] of 2nd Digit 5.3 cm                        [facility] Adult PT Treatment/Exercise - 02/20/21 0001       Manual Therapy   Manual therapy comments remeasured arm    Manual Lymphatic Drainage (MLD) in supine : short neck, 5 diaphragmatic breaths, right axillary nodes and establishment of interaxillary pathway, left inguinal nodes and establishment of axillo inguinal pathway, LUE working proximal to distal then retracing all steps    Compression Bandaging lotion applied from hand to axilla, thick stockinette from hand to axilla, elatomull to digits 1-5 then covered in paper tape, artiflex from wrist to axilla, 1 6 cm bandage to hand and halfway up forearm, 1 8 cm in herringbone from wrist to axilla, 1 10cm from wrist to axilla,  PT Long Term Goals - 01/14/21 1410       PT LONG TERM GOAL #1   Title Pt will demonstrate a 5 cm decrease in edema 15 cm proximal to ulnar styloid process to decrease risk of infection.    Baseline 29 cm    Time 6    Period Weeks    Status New    Target Date 03/18/21      PT LONG TERM GOAL #2   Title Pt will receive trial of FLex[employer]Touch compression pump for long term management of lymphedema    Time 6   on hold until Jan 9 due to scheduling   Period Weeks    Status New    Target Date 03/18/21   on hold until Jan 9 due to scheduling     PT [place] #3   Title Pt will be independent in self MLD for long term management of lymphedema.    Time 6    Period Weeks    Status New    Target Date 03/18/21   on hold until Jan 9 due to scheduling     PT LONG TERM GOAL #4   Title Pt will receive an appropriate compression garment for long term management of lymphedema.    Time 6    Period Weeks     Status New    Target Date 03/18/21   on hold until Jan 9 due to scheduling     PT LONG TERM GOAL #5   Title Pt will demonstrate 120 degrees of L shoulder abduction to allow pt to reach out to the side.    Baseline 64    Time 6    Period Weeks    Status New    Target Date 03/18/21   on hold until Jan 9 due to scheduling     Additional Long Term Goals   Additional Long Term Goals Yes      PT LONG TERM GOAL #6   Title Pt will be independent in a home exercise program for continued stretching and strengthening.    Time 6    Period Weeks    Status New    Target Date 03/18/21                   Plan - 02/20/21 1132     Clinical Impression Statement Pt has had good reduction with MLD and compression bandaging.  She is interested in finding out more about the [employer] so demographics were sent today. Treatment performed today with encouragement to continue elevation and exercise of forearm    Examination-Activity Limitations Caring for Others;Reach Overhead;Lift    Stability/Clinical Decision Making Stable/Uncomplicated    Rehab Potential Good    PT Frequency 3x / week    PT Duration 6 weeks    PT Treatment/Interventions ADLs/Self Care Home Management;Patient/family education;Therapeutic exercise;Manual techniques;Manual lymph drainage;Compression bandaging;Orthotic Fit/Training;Passive range of motion;Vasopneumatic Device    PT Next Visit Plan heard from [place]? ( see blue sticky note) , cont complete CDT, use alight for garments once pt demonstrates maximal reduction, gentle PROM and AAROM exercises for L shoulder    Consulted and Agree with Plan of Care Patient             Patient will benefit from skilled therapeutic intervention in order to improve the following deficits and impairments:  Postural dysfunction, Impaired UE functional use, Increased fascial restricitons, Decreased strength, Decreased range of motion, Increased edema  Visit Diagnosis: Lymphedema, not  elsewhere classified  Stiffness of left shoulder, not elsewhere classified  Malignant neoplasm of left breast in female, estrogen receptor positive, unspecified site of breast [facility])     Problem List Patient Active Problem List   Diagnosis Date Noted   SOB (shortness of breath) 01/22/2021   HTN (hypertension) 11/11/2020   Hospice care patient 06/05/2020   Acute hypoxemic respiratory failure ([place]) 05/28/2020   Lymphedema 05/27/2020   Bilateral lower extremity edema 05/27/2020   Malignant pleural effusion 05/27/2020   Anxiety state 05/21/2020   Carcinoma of breast metastatic to bone ([place]) 03/19/2020   Protein-calorie malnutrition, severe 03/04/2020   Pressure injury of skin 02/28/2020   Bone metastases ([place]) 02/27/2020   Elevated LFTs 02/27/2020   Elevated troponin 02/27/2020   Pleural effusion 02/27/2020   Breast mass, left 02/27/2020   Sinus tachycardia 02/27/2020   Donato Heinz. Owens Shark PT  Norwood Levo, PT 02/20/2021, 11:36 AM  [facility] @ [place], [place], [address] Phone: [phone]   Fax:  [phone]  Name: Morgan Yu MRN: [id number] Date of Birth: [birth year]

## 2021-02-23 ENCOUNTER — Encounter: Payer: Medicare HMO | Admitting: Physical Therapy

## 2021-02-24 ENCOUNTER — Other Ambulatory Visit: Payer: Self-pay

## 2021-02-24 ENCOUNTER — Ambulatory Visit: Payer: Medicare HMO

## 2021-02-24 DIAGNOSIS — M25612 Stiffness of left shoulder, not elsewhere classified: Secondary | ICD-10-CM

## 2021-02-24 DIAGNOSIS — Z170 Estrogen receptor positive status [ER+]: Secondary | ICD-10-CM | POA: Diagnosis not present

## 2021-02-24 DIAGNOSIS — C50912 Malignant neoplasm of unspecified site of left female breast: Secondary | ICD-10-CM | POA: Diagnosis not present

## 2021-02-24 DIAGNOSIS — I890 Lymphedema, not elsewhere classified: Secondary | ICD-10-CM | POA: Diagnosis not present

## 2021-02-24 NOTE — Therapy (Signed)
[facility] @ [place], [place], [address] Phone: [phone]   Fax:  [phone]  Physical Therapy Treatment  Patient Details  Name: Morgan Yu MRN: [id number] Date of Birth: 1940/09/05 Referring Provider (PT): Iruku   Encounter Date: 02/24/2021   PT End of Session - 02/24/21 1004     Visit Number 8    Number of Visits 19    Date for PT Re-Evaluation 03/18/21    PT Start Time 0904    PT Stop Time 1002    PT Time Calculation (min) 58 min    Activity Tolerance Patient tolerated treatment well    Behavior During Therapy [facility] for tasks assessed/performed             Past Medical History:  Diagnosis Date   Breast cancer ([place])    with bone metastasis    Past Surgical History:  Procedure Laterality Date   IR PERC PLEURAL DRAIN W/INDWELL CATH W/IMG GUIDE  06/03/2020   IR RADIOLOGIST EVAL & MGMT  07/31/2020   IR REMOVAL OF PLURAL CATH W/CUFF  10/31/2020    There were no vitals filed for this visit.   Subjective Assessment - 02/24/21 0906     Subjective I'm tolerating the bandages well.    Pertinent History breast cancer with mets, hepatic, osseous, pulmonary, and axillary nodal metastasis, axillary chest wall mets, biopsy 02/29/20, ER+ PR- HER2+, pt was receiving radiation in April 2022 and had to stop due to anxiety from fluid build up, pt had a drain placed to drain fluid from abdominal cavity from low protein, pt taking herceptin, drain has been out since June 26th, pt has had lymphedema in LUE since Jan 2021 when she was diagnosed    Patient Stated Goals to get arm swelling down    Currently in Pain? No/denies                               [facility] Adult PT Treatment/Exercise - 02/24/21 0001       Manual Therapy   Manual Lymphatic Drainage (MLD) in supine : short neck, 5 diaphragmatic breaths, right axillary nodes and establishment of interaxillary pathway, left inguinal nodes and  establishment of axillo inguinal pathway, LUE working proximal to distal then retracing all steps    Compression Bandaging Cocoa butter applied from hand to axilla, thick stockinette from hand to axilla, did not do Elastomull as done last time but pt will probably need it again next session, artiflex from wrist to axilla with 1/4" gray compression foam added to dorsum of hand and ant/post forearm to help further facilitate reduction of fibrosis at distal elbow, then 1 6 cm bandage to hand and halfway up forearm (forearm done in herring bone fashion), 1 8 cm wrist to axilla, 1 10cm from wrist to axilla,                          PT Long Term Goals - 01/14/21 1410       PT LONG TERM GOAL #1   Title Pt will demonstrate a 5 cm decrease in edema 15 cm proximal to ulnar styloid process to decrease risk of infection.    Baseline 29 cm    Time 6    Period Weeks    Status New    Target Date 03/18/21      PT LONG TERM GOAL #  2   Title Pt will receive trial of FLexiTouch compression pump for long term management of lymphedema    Time 6   on hold until Jan 9 due to scheduling   Period Weeks    Status New    Target Date 03/18/21   on hold until Jan 9 due to scheduling     PT [place] #3   Title Pt will be independent in self MLD for long term management of lymphedema.    Time 6    Period Weeks    Status New    Target Date 03/18/21   on hold until Jan 9 due to scheduling     PT LONG TERM GOAL #4   Title Pt will receive an appropriate compression garment for long term management of lymphedema.    Time 6    Period Weeks    Status New    Target Date 03/18/21   on hold until Jan 9 due to scheduling     PT LONG TERM GOAL #5   Title Pt will demonstrate 120 degrees of L shoulder abduction to allow pt to reach out to the side.    Baseline 64    Time 6    Period Weeks    Status New    Target Date 03/18/21   on hold until Jan 9 due to scheduling     Additional Long Term Goals    Additional Long Term Goals Yes      PT LONG TERM GOAL #6   Title Pt will be independent in a home exercise program for continued stretching and strengthening.    Time 6    Period Weeks    Status New    Target Date 03/18/21                   Plan - 02/24/21 1218     Clinical Impression Statement Pt continues with good visible reductions with complete decongestive therapy. She does still have fibrosis present inferior to her elbow so added compression foam to her dorsal hand and forearm. Pt reports this comfortable at end of session. Pt will benefit from continued physical therapy at this time to allow for further circumferential reductions.    Examination-Activity Limitations Caring for Others;Reach Overhead;Lift    Stability/Clinical Decision Making Stable/Uncomplicated    Rehab Potential Good    PT Frequency 3x / week    PT Duration 6 weeks    PT Treatment/Interventions ADLs/Self Care Home Management;Patient/family education;Therapeutic exercise;Manual techniques;Manual lymph drainage;Compression bandaging;Orthotic Fit/Training;Passive range of motion;Vasopneumatic Device    PT Next Visit Plan cont complete CDT, use alight for garments once pt demonstrates maximal reduction, gentle PROM and AAROM exercises for L shoulder    Consulted and Agree with Plan of Care Patient             Patient will benefit from skilled therapeutic intervention in order to improve the following deficits and impairments:  Postural dysfunction, Impaired UE functional use, Increased fascial restricitons, Decreased strength, Decreased range of motion, Increased edema  Visit Diagnosis: Lymphedema, not elsewhere classified  Stiffness of left shoulder, not elsewhere classified  Malignant neoplasm of left breast in female, estrogen receptor positive, unspecified site of breast [facility])     Problem List Patient Active Problem List   Diagnosis Date Noted   SOB (shortness of breath) 01/22/2021    HTN (hypertension) 11/11/2020   Hospice care patient 06/05/2020   Acute hypoxemic respiratory failure ([place]) 05/28/2020   Lymphedema  05/27/2020   Bilateral lower extremity edema 05/27/2020   Malignant pleural effusion 05/27/2020   Anxiety state 05/21/2020   Carcinoma of breast metastatic to bone ([place]) 03/19/2020   Protein-calorie malnutrition, severe 03/04/2020   Pressure injury of skin 02/28/2020   Bone metastases ([place]) 02/27/2020   Elevated LFTs 02/27/2020   Elevated troponin 02/27/2020   Pleural effusion 02/27/2020   Breast mass, left 02/27/2020   Sinus tachycardia 02/27/2020    Otelia Limes, PTA 02/24/2021, 12:28 PM  [facility] @ [place], [place], [address] Phone: [phone]   Fax:  [phone]  Name: ADELINE PETITFRERE MRN: [id number] Date of Birth: 11/24/1940

## 2021-02-25 ENCOUNTER — Encounter: Payer: Medicare HMO | Admitting: Physical Therapy

## 2021-02-26 ENCOUNTER — Other Ambulatory Visit: Payer: Self-pay

## 2021-02-26 ENCOUNTER — Encounter: Payer: Self-pay | Admitting: Physical Therapy

## 2021-02-26 ENCOUNTER — Ambulatory Visit: Payer: Medicare HMO | Admitting: Physical Therapy

## 2021-02-26 DIAGNOSIS — C50912 Malignant neoplasm of unspecified site of left female breast: Secondary | ICD-10-CM

## 2021-02-26 DIAGNOSIS — M25612 Stiffness of left shoulder, not elsewhere classified: Secondary | ICD-10-CM | POA: Diagnosis not present

## 2021-02-26 DIAGNOSIS — Z170 Estrogen receptor positive status [ER+]: Secondary | ICD-10-CM | POA: Diagnosis not present

## 2021-02-26 DIAGNOSIS — I890 Lymphedema, not elsewhere classified: Secondary | ICD-10-CM | POA: Diagnosis not present

## 2021-02-26 NOTE — Therapy (Signed)
[facility] @ [place], [place], [address] Phone: [phone]   Fax:  [phone]  Physical Therapy Treatment  Patient Details  Name: Morgan Yu MRN: [id number] Date of Birth: 29-Dec-1940 Referring Provider (PT): Iruku   Encounter Date: 02/26/2021   PT End of Session - 02/26/21 1354     Visit Number 9    Number of Visits 19    Date for PT Re-Evaluation 03/18/21    PT Start Time 1304    PT Stop Time 1352    PT Time Calculation (min) 48 min    Activity Tolerance Patient tolerated treatment well    Behavior During Therapy [facility] for tasks assessed/performed             Past Medical History:  Diagnosis Date   Breast cancer ([place])    with bone metastasis    Past Surgical History:  Procedure Laterality Date   IR PERC PLEURAL DRAIN W/INDWELL CATH W/IMG GUIDE  06/03/2020   IR RADIOLOGIST EVAL & MGMT  07/31/2020   IR REMOVAL OF PLURAL CATH W/CUFF  10/31/2020    There were no vitals filed for this visit.   Subjective Assessment - 02/26/21 1304     Subjective They are going to come out Tuesday to demo the compression pump. My fingers did not swell after not bandaging them.    Pertinent History breast cancer with mets, hepatic, osseous, pulmonary, and axillary nodal metastasis, axillary chest wall mets, biopsy 02/29/20, ER+ PR- HER2+, pt was receiving radiation in April 2022 and had to stop due to anxiety from fluid build up, pt had a drain placed to drain fluid from abdominal cavity from low protein, pt taking herceptin, drain has been out since June 26th, pt has had lymphedema in LUE since Jan 2021 when she was diagnosed    Patient Stated Goals to get arm swelling down    Currently in Pain? No/denies    Pain Score 0-No pain                   LYMPHEDEMA/ONCOLOGY QUESTIONNAIRE - 02/26/21 0001       Left Upper Extremity Lymphedema   10 cm Proximal to Olecranon Process 26.8 cm    Olecranon Process 24.4  cm    15 cm Proximal to Ulnar Styloid Process 23.8 cm    10 cm Proximal to Ulnar Styloid Process 20 cm    Just Proximal to Ulnar Styloid Process 15.3 cm    Across Hand at [employer] 17.3 cm    At [place] of 2nd Digit 5.7 cm                        [facility] Adult PT Treatment/Exercise - 02/26/21 0001       Manual Therapy   Manual Lymphatic Drainage (MLD) in supine : short neck, 5 diaphragmatic breaths, right axillary nodes and establishment of interaxillary pathway, left inguinal nodes and establishment of axillo inguinal pathway, LUE working proximal to distal then retracing all steps    Compression Bandaging lotion applied from hand to axilla, thick stockinette from hand to axilla, did not do Elastomull as done last time but pt will probably need it again next session, artiflex from wrist to axilla with 1/4" gray compression foam added to dorsum of hand and ant/post forearm to help further facilitate reduction of fibrosis at distal elbow, then 1 6 cm bandage to hand and halfway up forearm (  forearm done in herring bone fashion), 1 8 cm wrist to axilla in herringbone, 1 10cm from wrist to axilla,                          PT Long Term Goals - 01/14/21 1410       PT LONG TERM GOAL #1   Title Pt will demonstrate a 5 cm decrease in edema 15 cm proximal to ulnar styloid process to decrease risk of infection.    Baseline 29 cm    Time 6    Period Weeks    Status New    Target Date 03/18/21      PT LONG TERM GOAL #2   Title Pt will receive trial of FLexiTouch compression pump for long term management of lymphedema    Time 6   on hold until Jan 9 due to scheduling   Period Weeks    Status New    Target Date 03/18/21   on hold until Jan 9 due to scheduling     PT [place] #3   Title Pt will be independent in self MLD for long term management of lymphedema.    Time 6    Period Weeks    Status New    Target Date 03/18/21   on hold until Jan 9 due to  scheduling     PT LONG TERM GOAL #4   Title Pt will receive an appropriate compression garment for long term management of lymphedema.    Time 6    Period Weeks    Status New    Target Date 03/18/21   on hold until Jan 9 due to scheduling     PT LONG TERM GOAL #5   Title Pt will demonstrate 120 degrees of L shoulder abduction to allow pt to reach out to the side.    Baseline 64    Time 6    Period Weeks    Status New    Target Date 03/18/21   on hold until Jan 9 due to scheduling     Additional Long Term Goals   Additional Long Term Goals Yes      PT LONG TERM GOAL #6   Title Pt will be independent in a home exercise program for continued stretching and strengthening.    Time 6    Period Weeks    Status New    Target Date 03/18/21                   Plan - 02/26/21 1354     Clinical Impression Statement Pt did not have her fingers bandaged at last session. Remeasured circumferences of RUE and her fingers were up slightly but not significantly. The rest of of her upper extremity demonstrates great decrease in circumference. Will continue with MLD and compression bandaging until pt is maximally reduced. The foam helped to reduce hand and forearm.    PT Frequency 3x / week    PT Duration 6 weeks    PT Treatment/Interventions ADLs/Self Care Home Management;Patient/family education;Therapeutic exercise;Manual techniques;Manual lymph drainage;Compression bandaging;Orthotic Fit/Training;Passive range of motion;Vasopneumatic Device    PT Next Visit Plan cont complete CDT, use alight for garments once pt demonstrates maximal reduction, gentle PROM and AAROM exercises for L shoulder    Consulted and Agree with Plan of Care Patient             Patient will benefit from skilled therapeutic intervention in  order to improve the following deficits and impairments:  Postural dysfunction, Impaired UE functional use, Increased fascial restricitons, Decreased strength, Decreased  range of motion, Increased edema  Visit Diagnosis: Lymphedema, not elsewhere classified  Stiffness of left shoulder, not elsewhere classified  Malignant neoplasm of left breast in female, estrogen receptor positive, unspecified site of breast [facility])     Problem List Patient Active Problem List   Diagnosis Date Noted   SOB (shortness of breath) 01/22/2021   HTN (hypertension) 11/11/2020   Hospice care patient 06/05/2020   Acute hypoxemic respiratory failure ([place]) 05/28/2020   Lymphedema 05/27/2020   Bilateral lower extremity edema 05/27/2020   Malignant pleural effusion 05/27/2020   Anxiety state 05/21/2020   Carcinoma of breast metastatic to bone ([place]) 03/19/2020   Protein-calorie malnutrition, severe 03/04/2020   Pressure injury of skin 02/28/2020   Bone metastases ([place]) 02/27/2020   Elevated LFTs 02/27/2020   Elevated troponin 02/27/2020   Pleural effusion 02/27/2020   Breast mass, left 02/27/2020   Sinus tachycardia 02/27/2020    [name] [place], PT 02/26/2021, 1:56 PM  [facility] @ [place], [place], [address] Phone: [phone]   Fax:  [phone]  Name: Morgan Yu MRN: [id number] Date of Birth: 09/01/1940  Manus Gunning, PT 02/26/21 1:56 PM

## 2021-03-02 ENCOUNTER — Ambulatory Visit: Payer: Medicare HMO | Admitting: Physical Therapy

## 2021-03-02 ENCOUNTER — Encounter: Payer: Self-pay | Admitting: Physical Therapy

## 2021-03-02 ENCOUNTER — Other Ambulatory Visit: Payer: Self-pay

## 2021-03-02 ENCOUNTER — Encounter: Payer: Self-pay | Admitting: *Deleted

## 2021-03-02 DIAGNOSIS — C50912 Malignant neoplasm of unspecified site of left female breast: Secondary | ICD-10-CM

## 2021-03-02 DIAGNOSIS — M25612 Stiffness of left shoulder, not elsewhere classified: Secondary | ICD-10-CM | POA: Diagnosis not present

## 2021-03-02 DIAGNOSIS — I890 Lymphedema, not elsewhere classified: Secondary | ICD-10-CM

## 2021-03-02 DIAGNOSIS — Z170 Estrogen receptor positive status [ER+]: Secondary | ICD-10-CM

## 2021-03-02 NOTE — Therapy (Addendum)
[facility] @ [place], [place], [address] Phone: [phone]   Fax:  [phone]  Physical Therapy Treatment  Patient Details  Name: Morgan Yu MRN: [id number] Date of Birth: [birth year] Referring Provider (PT): Iruku   Encounter Date: 03/02/2021   PT End of Session - 03/02/21 1351     Visit Number 10    Number of Visits 19    Date for PT Re-Evaluation 03/18/21    PT Start Time 1304    PT Stop Time 1353    PT Time Calculation (min) 49 min    Activity Tolerance Patient tolerated treatment well    Behavior During Therapy [facility] for tasks assessed/performed             Past Medical History:  Diagnosis Date   Breast cancer ([place])    with bone metastasis    Past Surgical History:  Procedure Laterality Date   IR PERC PLEURAL DRAIN W/INDWELL CATH W/IMG GUIDE  06/03/2020   IR RADIOLOGIST EVAL & MGMT  07/31/2020   IR REMOVAL OF PLURAL CATH W/CUFF  10/31/2020    There were no vitals filed for this visit.   Subjective Assessment - 03/02/21 1304     Subjective I have had my bandages off since Saturday because they were sliding down.    Pertinent History breast cancer with mets, hepatic, osseous, pulmonary, and axillary nodal metastasis, axillary chest wall mets, biopsy 02/29/20, ER+ PR- HER2+, pt was receiving radiation in April 2022 and had to stop due to anxiety from fluid build up, pt had a drain placed to drain fluid from abdominal cavity from low protein, pt taking herceptin, drain has been out since June 26th, pt has had lymphedema in LUE since Jan 2021 when she was diagnosed    Patient Stated Goals to get arm swelling down    Currently in Pain? No/denies    Pain Score 0-No pain                   LYMPHEDEMA/ONCOLOGY QUESTIONNAIRE - 03/02/21 0001       Left Upper Extremity Lymphedema   10 cm Proximal to Olecranon Process 27.5 cm    Olecranon Process 24.5 cm    15 cm Proximal to Ulnar Styloid  Process 25.5 cm    10 cm Proximal to Ulnar Styloid Process 22.4 cm    Just Proximal to Ulnar Styloid Process 17.5 cm    Across Hand at [employer] 17.3 cm    At [place] of 2nd Digit 5.5 cm                        [facility] Adult PT Treatment/Exercise - 03/02/21 0001       Manual Therapy   Manual Lymphatic Drainage (MLD) in supine : short neck, 5 diaphragmatic breaths, right axillary nodes and establishment of interaxillary pathway, left inguinal nodes and establishment of axillo inguinal pathway, LUE working proximal to distal then retracing all steps    Compression Bandaging lotion applied from hand to axilla, thick stockinette from hand to axilla, did not do Elastomull as done last time but pt will probably need it again next session, artiflex from wrist to axilla with 1/4" gray compression foam added to dorsum of hand and ant/post forearm to help further facilitate reduction of fibrosis at distal elbow, then 1 6 cm bandage to hand and halfway up forearm (forearm done in herring bone fashion), 1 8  cm wrist to axilla in herringbone, 1 10cm from wrist to axilla,                          PT Long Term Goals - 03/02/21 1502       PT LONG TERM GOAL #1   Title Pt will demonstrate a 5 cm decrease in edema 15 cm proximal to ulnar styloid process to decrease risk of infection.    Baseline 29 cm; 03/02/21- 25.5    Time 6    Period Weeks    Status On-going      PT LONG TERM GOAL #2   Title Pt will receive trial of FLexiTouch compression pump for long term management of lymphedema    Baseline 03/02/21- demographics sent    Time 6    Period Weeks    Status On-going      PT LONG TERM GOAL #3   Title Pt will be independent in self MLD for long term management of lymphedema.    Time 6    Period Weeks    Status On-going      PT LONG TERM GOAL #4   Title Pt will receive an appropriate compression garment for long term management of lymphedema.    Baseline 03/02/21- pt  is still demonstrating reduction    Time 6    Period Weeks    Status On-going      PT LONG TERM GOAL #5   Title Pt will demonstrate 120 degrees of L shoulder abduction to allow pt to reach out to the side.    Baseline 64    Time 6    Period Weeks    Status On-going      PT LONG TERM GOAL #6   Title Pt will be independent in a home exercise program for continued stretching and strengthening.    Time 6    Period Weeks    Status On-going                   Plan - 03/02/21 1354     Clinical Impression Statement Pt reports she had to remove her bandages on Saturday because they had slid down covering her fingers. Remeasured circumferences today and pt demonstrated an increase in her forearm since she was not bandaged for approximately 48 hours. Continued with MLD and reapplied compression bandages today. Pt would benefit from a compression pump to help her independently manage her lymphedema at home. She has fibrosis present in her forearm. She has failed over four weeks of conservative therapy. She currently does not present with truncal edema but the absense of a chest component to her pump would place her at a greater risk for developing truncal or breast edema.    PT Frequency 3x / week    PT Duration 6 weeks    PT Treatment/Interventions ADLs/Self Care Home Management;Patient/family education;Therapeutic exercise;Manual techniques;Manual lymph drainage;Compression bandaging;Orthotic Fit/Training;Passive range of motion;Vasopneumatic Device    PT Next Visit Plan cont complete CDT, use alight for garments once pt demonstrates maximal reduction, gentle PROM and AAROM exercises for L shoulder    Consulted and Agree with Plan of Care Patient             Patient will benefit from skilled therapeutic intervention in order to improve the following deficits and impairments:  Postural dysfunction, Impaired UE functional use, Increased fascial restricitons, Decreased strength,  Decreased range of motion, Increased edema  Visit Diagnosis: Lymphedema, not  elsewhere classified  Stiffness of left shoulder, not elsewhere classified  Malignant neoplasm of left breast in female, estrogen receptor positive, unspecified site of breast [facility])     Problem List Patient Active Problem List   Diagnosis Date Noted   SOB (shortness of breath) 01/22/2021   HTN (hypertension) 11/11/2020   Hospice care patient 06/05/2020   Acute hypoxemic respiratory failure ([place]) 05/28/2020   Lymphedema 05/27/2020   Bilateral lower extremity edema 05/27/2020   Malignant pleural effusion 05/27/2020   Anxiety state 05/21/2020   Carcinoma of breast metastatic to bone ([place]) 03/19/2020   Protein-calorie malnutrition, severe 03/04/2020   Pressure injury of skin 02/28/2020   Bone metastases ([place]) 02/27/2020   Elevated LFTs 02/27/2020   Elevated troponin 02/27/2020   Pleural effusion 02/27/2020   Breast mass, left 02/27/2020   Sinus tachycardia 02/27/2020    [name] [place], PT 03/02/2021, 3:06 PM  [facility] @ [place], [place], [address] Phone: [phone]   Fax:  [phone]  Name: ZIKERIA KEOUGH MRN: [id number] Date of Birth: Sep 01, 1940

## 2021-03-03 ENCOUNTER — Other Ambulatory Visit: Payer: Self-pay

## 2021-03-04 ENCOUNTER — Other Ambulatory Visit: Payer: Self-pay

## 2021-03-04 ENCOUNTER — Ambulatory Visit: Payer: Medicare HMO | Attending: Hematology and Oncology | Admitting: Physical Therapy

## 2021-03-04 ENCOUNTER — Encounter: Payer: Self-pay | Admitting: Physical Therapy

## 2021-03-04 DIAGNOSIS — Z170 Estrogen receptor positive status [ER+]: Secondary | ICD-10-CM | POA: Insufficient documentation

## 2021-03-04 DIAGNOSIS — I890 Lymphedema, not elsewhere classified: Secondary | ICD-10-CM | POA: Insufficient documentation

## 2021-03-04 DIAGNOSIS — C50912 Malignant neoplasm of unspecified site of left female breast: Secondary | ICD-10-CM | POA: Insufficient documentation

## 2021-03-04 DIAGNOSIS — M25612 Stiffness of left shoulder, not elsewhere classified: Secondary | ICD-10-CM | POA: Diagnosis not present

## 2021-03-04 NOTE — Therapy (Signed)
[facility] @ [place], [place], [address] Phone: [phone]   Fax:  [phone]  Physical Therapy Treatment  Patient Details  Name: Morgan Yu MRN: [id number] Date of Birth: 1940-05-22 Referring Provider (PT): Iruku   Encounter Date: 03/04/2021   PT End of Session - 03/04/21 1353     Visit Number 11    Number of Visits 19    Date for PT Re-Evaluation 03/18/21    PT Start Time 1301    PT Stop Time 1017    PT Time Calculation (min) 52 min    Activity Tolerance Patient tolerated treatment well    Behavior During Therapy [facility] for tasks assessed/performed             Past Medical History:  Diagnosis Date   Breast cancer ([place])    with bone metastasis    Past Surgical History:  Procedure Laterality Date   IR PERC PLEURAL DRAIN W/INDWELL CATH W/IMG GUIDE  06/03/2020   IR RADIOLOGIST EVAL & MGMT  07/31/2020   IR REMOVAL OF PLURAL CATH W/CUFF  10/31/2020    There were no vitals filed for this visit.   Subjective Assessment - 03/04/21 1306     Subjective I will have to pay 750 dollars for the pump. I am filling out the patient assistance form.    Pertinent History breast cancer with mets, hepatic, osseous, pulmonary, and axillary nodal metastasis, axillary chest wall mets, biopsy 02/29/20, ER+ PR- HER2+, pt was receiving radiation in April 2022 and had to stop due to anxiety from fluid build up, pt had a drain placed to drain fluid from abdominal cavity from low protein, pt taking herceptin, drain has been out since June 26th, pt has had lymphedema in LUE since Jan 2021 when she was diagnosed    Patient Stated Goals to get arm swelling down    Currently in Pain? No/denies    Pain Score 0-No pain                               [facility] Adult PT Treatment/Exercise - 03/04/21 0001       Manual Therapy   Manual Lymphatic Drainage (MLD) in supine : short neck, 5 diaphragmatic breaths, right  axillary nodes and establishment of interaxillary pathway, left inguinal nodes and establishment of axillo inguinal pathway, LUE working proximal to distal then retracing all steps    Compression Bandaging lotion applied from hand to axilla, thick stockinette from hand to axilla, did not do Elastomull as done last time but pt will probably need it again next session, artiflex from wrist to axilla with 1/4" gray compression foam added to dorsum of hand and ant/post forearm to help further facilitate reduction of fibrosis at distal elbow, then 1 6 cm bandage to hand and halfway up forearm (forearm done in herring bone fashion), 1 8 cm wrist to axilla in herringbone, 1 10cm from wrist to axilla,                          PT Long Term Goals - 03/02/21 1502       PT LONG TERM GOAL #1   Title Pt will demonstrate a 5 cm decrease in edema 15 cm proximal to ulnar styloid process to decrease risk of infection.    Baseline 29 cm; 03/02/21- 25.5    Time 6  Period Weeks    Status On-going      PT LONG TERM GOAL #2   Title Pt will receive trial of FLexiTouch compression pump for long term management of lymphedema    Baseline 03/02/21- demographics sent    Time 6    Period Weeks    Status On-going      PT LONG TERM GOAL #3   Title Pt will be independent in self MLD for long term management of lymphedema.    Time 6    Period Weeks    Status On-going      PT LONG TERM GOAL #4   Title Pt will receive an appropriate compression garment for long term management of lymphedema.    Baseline 03/02/21- pt is still demonstrating reduction    Time 6    Period Weeks    Status On-going      PT LONG TERM GOAL #5   Title Pt will demonstrate 120 degrees of L shoulder abduction to allow pt to reach out to the side.    Baseline 64    Time 6    Period Weeks    Status On-going      PT LONG TERM GOAL #6   Title Pt will be independent in a home exercise program for continued stretching and  strengthening.    Time 6    Period Weeks    Status On-going                   Plan - 03/04/21 1354     Clinical Impression Statement Removed compression bandages today and pt continues to demonstrate good reduction per visual estimate. She has increased skin wrinkling throughout her LUE now. FIbrosis has greatly decreased in L forearm. Continued with MLD then reapplied compression bandaging.    PT Frequency 3x / week    PT Duration 6 weeks    PT Treatment/Interventions ADLs/Self Care Home Management;Patient/family education;Therapeutic exercise;Manual techniques;Manual lymph drainage;Compression bandaging;Orthotic Fit/Training;Passive range of motion;Vasopneumatic Device    PT Next Visit Plan cont complete CDT, use alight for garments once pt demonstrates maximal reduction, gentle PROM and AAROM exercises for L shoulder    Consulted and Agree with Plan of Care Patient             Patient will benefit from skilled therapeutic intervention in order to improve the following deficits and impairments:  Postural dysfunction, Impaired UE functional use, Increased fascial restricitons, Decreased strength, Decreased range of motion, Increased edema  Visit Diagnosis: Lymphedema, not elsewhere classified  Stiffness of left shoulder, not elsewhere classified  Malignant neoplasm of left breast in female, estrogen receptor positive, unspecified site of breast [facility])     Problem List Patient Active Problem List   Diagnosis Date Noted   SOB (shortness of breath) 01/22/2021   HTN (hypertension) 11/11/2020   Hospice care patient 06/05/2020   Acute hypoxemic respiratory failure ([place]) 05/28/2020   Lymphedema 05/27/2020   Bilateral lower extremity edema 05/27/2020   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, PT 03/04/2021, 1:58 PM  [facility] @ [place] [address]  [place], [place], [address] Phone: [phone]   Fax:  [phone]  Name: Morgan Yu MRN: [id number] Date of Birth: [birth year]

## 2021-03-05 ENCOUNTER — Ambulatory Visit: Payer: Medicare HMO | Admitting: Physical Therapy

## 2021-03-05 ENCOUNTER — Encounter: Payer: Self-pay | Admitting: Physical Therapy

## 2021-03-05 DIAGNOSIS — M25612 Stiffness of left shoulder, not elsewhere classified: Secondary | ICD-10-CM | POA: Diagnosis not present

## 2021-03-05 DIAGNOSIS — I890 Lymphedema, not elsewhere classified: Secondary | ICD-10-CM | POA: Diagnosis not present

## 2021-03-05 DIAGNOSIS — C50912 Malignant neoplasm of unspecified site of left female breast: Secondary | ICD-10-CM

## 2021-03-05 DIAGNOSIS — Z170 Estrogen receptor positive status [ER+]: Secondary | ICD-10-CM | POA: Diagnosis not present

## 2021-03-05 NOTE — Therapy (Signed)
[place], [place], [address] Phone: [phone]   Fax:  [phone]  Physical Therapy Treatment  Patient Details  Name: Morgan Yu MRN: [id number] Date of Birth: 08-16-1940 Referring Provider (PT): Iruku   Encounter Date: 03/05/2021   PT End of Session - 03/05/21 1305     Visit Number 12    Number of Visits 19    Date for PT Re-Evaluation 03/18/21    PT Start Time 1304    PT Stop Time 1354    PT Time Calculation (min) 50 min    Activity Tolerance Patient tolerated treatment well    Behavior During Therapy [facility] for tasks assessed/performed             Past Medical History:  Diagnosis Date   Breast cancer ([place])    with bone metastasis    Past Surgical History:  Procedure Laterality Date   IR PERC PLEURAL DRAIN W/INDWELL CATH W/IMG GUIDE  06/03/2020   IR RADIOLOGIST EVAL & MGMT  07/31/2020   IR REMOVAL OF PLURAL CATH W/CUFF  10/31/2020    There were no vitals filed for this visit.   Subjective Assessment - 03/05/21 1305     Subjective My arm is doing ok.    Pertinent History breast cancer with mets, hepatic, osseous, pulmonary, and axillary nodal metastasis, axillary chest wall mets, biopsy 02/29/20, ER+ PR- HER2+, pt was receiving radiation in April 2022 and had to stop due to anxiety from fluid build up, pt had a drain placed to drain fluid from abdominal cavity from low protein, pt taking herceptin, drain has been out since June 26th, pt has had lymphedema in LUE since Jan 2021 when she was diagnosed    Patient Stated Goals to get arm swelling down    Currently in Pain? No/denies    Pain Score 0-No pain                [facility] PT Assessment - 03/05/21 0001       AROM   Left Shoulder Flexion 106 Degrees    Left Shoulder ABduction 101 Degrees               LYMPHEDEMA/ONCOLOGY QUESTIONNAIRE - 03/05/21 0001       Left Upper Extremity Lymphedema   10 cm Proximal to  Olecranon Process 27 cm    Olecranon Process 23.2 cm    15 cm Proximal to Ulnar Styloid Process 23.5 cm    10 cm Proximal to Ulnar Styloid Process 19.9 cm    Just Proximal to Ulnar Styloid Process 16.3 cm    Across Hand at [employer] 17 cm    At [place] of 2nd Digit 5.5 cm                        [facility] Adult PT Treatment/Exercise - 03/05/21 0001       Manual Therapy   Manual therapy comments circumference measurements taken    Manual Lymphatic Drainage (MLD) in supine : short neck, 5 diaphragmatic breaths, right axillary nodes and establishment of interaxillary pathway, left inguinal nodes and establishment of axillo inguinal pathway, LUE working proximal to distal then retracing all steps    Compression Bandaging lotion applied from hand to axilla, thick stockinette from hand to axilla, did not do Elastomull as done last time but pt will probably need it again next session, artiflex from wrist to axilla with 1/4"  gray compression foam added to dorsum of hand and ant/post forearm to help further facilitate reduction of fibrosis at distal elbow, then 1 6 cm bandage to hand and halfway up forearm (forearm done in herring bone fashion), 1 8 cm wrist to axilla in herringbone, 1 10cm from wrist to axilla,                          PT Long Term Goals - 03/02/21 1502       PT LONG TERM GOAL #1   Title Pt will demonstrate a 5 cm decrease in edema 15 cm proximal to ulnar styloid process to decrease risk of infection.    Baseline 29 cm; 03/02/21- 25.5    Time 6    Period Weeks    Status On-going      PT LONG TERM GOAL #2   Title Pt will receive trial of FLexiTouch compression pump for long term management of lymphedema    Baseline 03/02/21- demographics sent    Time 6    Period Weeks    Status On-going      PT LONG TERM GOAL #3   Title Pt will be independent in self MLD for long term management of lymphedema.    Time 6    Period Weeks    Status On-going       PT LONG TERM GOAL #4   Title Pt will receive an appropriate compression garment for long term management of lymphedema.    Baseline 03/02/21- pt is still demonstrating reduction    Time 6    Period Weeks    Status On-going      PT LONG TERM GOAL #5   Title Pt will demonstrate 120 degrees of L shoulder abduction to allow pt to reach out to the side.    Baseline 64    Time 6    Period Weeks    Status On-going      PT LONG TERM GOAL #6   Title Pt will be independent in a home exercise program for continued stretching and strengthening.    Time 6    Period Weeks    Status On-going                   Plan - 03/05/21 1356     Clinical Impression Statement Pt returned today with bandages intact. Remeasured circumferences today and pt demonstrates a great decrease throughout LUE. Once she is no longer reducing she will be ready to be measured for a flat knit compression sleeve and glove and a night time garment. Continued with MLD and compression bandaging.    PT Frequency 3x / week    PT Duration 6 weeks    PT Treatment/Interventions ADLs/Self Care Home Management;Patient/family education;Therapeutic exercise;Manual techniques;Manual lymph drainage;Compression bandaging;Orthotic Fit/Training;Passive range of motion;Vasopneumatic Device    PT Next Visit Plan cont complete CDT, use alight for garments once pt demonstrates maximal reduction, gentle PROM and AAROM exercises for L shoulder    Consulted and Agree with Plan of Care Patient             Patient will benefit from skilled therapeutic intervention in order to improve the following deficits and impairments:  Postural dysfunction, Impaired UE functional use, Increased fascial restricitons, Decreased strength, Decreased range of motion, Increased edema  Visit Diagnosis: Lymphedema, not elsewhere classified  Stiffness of left shoulder, not elsewhere classified  Malignant neoplasm of left breast in female, estrogen  receptor  positive, unspecified site of breast [facility])     Problem List Patient Active Problem List   Diagnosis Date Noted   SOB (shortness of breath) 01/22/2021   HTN (hypertension) 11/11/2020   Hospice care patient 06/05/2020   Acute hypoxemic respiratory failure ([place]) 05/28/2020   Lymphedema 05/27/2020   Bilateral lower extremity edema 05/27/2020   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

## 2021-03-08 DIAGNOSIS — J9601 Acute respiratory failure with hypoxia: Secondary | ICD-10-CM | POA: Diagnosis not present

## 2021-03-08 DIAGNOSIS — J90 Pleural effusion, not elsewhere classified: Secondary | ICD-10-CM | POA: Diagnosis not present

## 2021-03-08 DIAGNOSIS — C799 Secondary malignant neoplasm of unspecified site: Secondary | ICD-10-CM | POA: Diagnosis not present

## 2021-03-09 ENCOUNTER — Other Ambulatory Visit: Payer: Self-pay

## 2021-03-09 ENCOUNTER — Ambulatory Visit: Payer: Medicare HMO | Admitting: Physical Therapy

## 2021-03-09 DIAGNOSIS — M25612 Stiffness of left shoulder, not elsewhere classified: Secondary | ICD-10-CM | POA: Diagnosis not present

## 2021-03-09 DIAGNOSIS — Z170 Estrogen receptor positive status [ER+]: Secondary | ICD-10-CM | POA: Diagnosis not present

## 2021-03-09 DIAGNOSIS — I890 Lymphedema, not elsewhere classified: Secondary | ICD-10-CM | POA: Diagnosis not present

## 2021-03-09 DIAGNOSIS — C50912 Malignant neoplasm of unspecified site of left female breast: Secondary | ICD-10-CM | POA: Diagnosis not present

## 2021-03-09 NOTE — Therapy (Signed)
[facility] @ [place], [place], [address] Phone: [phone]   Fax:  [phone]  Physical Therapy Treatment  Patient Details  Name: Morgan Yu MRN: [id number] Date of Birth: [birth year] Referring Provider (PT): Iruku   Encounter Date: 03/09/2021   PT End of Session - 03/09/21 1459     Visit Number 13    Number of Visits 19    Date for PT Re-Evaluation 03/18/21    PT Start Time 1300    PT Stop Time 3383    PT Time Calculation (min) 55 min    Activity Tolerance Patient tolerated treatment well    Behavior During Therapy [facility] for tasks assessed/performed             Past Medical History:  Diagnosis Date   Breast cancer ([place])    with bone metastasis    Past Surgical History:  Procedure Laterality Date   IR PERC PLEURAL DRAIN W/INDWELL CATH W/IMG GUIDE  06/03/2020   IR RADIOLOGIST EVAL & MGMT  07/31/2020   IR REMOVAL OF PLURAL CATH W/CUFF  10/31/2020    There were no vitals filed for this visit.   Subjective Assessment - 03/09/21 1455     Subjective Pt is hoping she will be able to get her sleeve soon.    Pertinent History breast cancer with mets, hepatic, osseous, pulmonary, and axillary nodal metastasis, axillary chest wall mets, biopsy 02/29/20, ER+ PR- HER2+, pt was receiving radiation in April 2022 and had to stop due to anxiety from fluid build up, pt had a drain placed to drain fluid from abdominal cavity from low protein, pt taking herceptin, drain has been out since June 26th, pt has had lymphedema in LUE since Jan 2021 when she was diagnosed    Patient Stated Goals to get arm swelling down    Currently in Pain? No/denies                   LYMPHEDEMA/ONCOLOGY QUESTIONNAIRE - 03/09/21 0001       Left Upper Extremity Lymphedema   10 cm Proximal to Olecranon Process 27 cm    Olecranon Process 22.53 cm    15 cm Proximal to Ulnar Styloid Process 23 cm    10 cm Proximal to Ulnar Styloid  Process 21 cm    Just Proximal to Ulnar Styloid Process 15.5 cm    Across Hand at [employer] 16.5 cm    At [place] of 2nd Digit 5.5 cm                        [facility] Adult PT Treatment/Exercise - 03/09/21 0001       Manual Therapy   Manual Therapy Edema management    Manual therapy comments circumference measurements taken    Edema Management gave pt information to go to [facility] for a circaiad profile or tribute wrap sleeve and glove and a flat knit sleeve and glove and Alight form.Pt instructed to call for appointment hopefully right before next visit here on Wednesday    Manual Lymphatic Drainage (MLD) in supine : short neck, 5 diaphragmatic breaths, right axillary nodes and establishment of interaxillary pathway, left inguinal nodes and establishment of axillo inguinal pathway, LUE working proximal to distal then retracing all steps    Compression Bandaging lotion applied from hand to axilla, thick stockinette from hand to axilla, did not do Elastomull as done last time but  pt will probably need it again next session, artiflex from wrist to axilla with 1/4" gray compression foam added to dorsum of hand and ant/post forearm to help further facilitate reduction of fibrosis at distal elbow, then 1 6 cm bandage to hand and halfway up forearm (forearm done in herring bone fashion), 1 8 cm wrist to axilla in herringbone, 1 10cm from wrist to axilla,                          PT Long Term Goals - 03/09/21 1603       PT LONG TERM GOAL #1   Title Pt will demonstrate a 5 cm decrease in edema 15 cm proximal to ulnar styloid process to decrease risk of infection.    Baseline 29 cm; 03/02/21- 25.5, 03/09/2021: 23    Status Achieved      PT LONG TERM GOAL #2   Title Pt will receive trial of FLexiTouch compression pump for long term management of lymphedema    Baseline 03/02/21- demographics sent    Time 6    Period Weeks    Status On-going      PT LONG TERM GOAL  #3   Title Pt will be independent in self MLD for long term management of lymphedema.    Time 6    Period Weeks    Status On-going      PT LONG TERM GOAL #4   Title Pt will receive an appropriate compression garment for long term management of lymphedema.    Time 6    Period Weeks    Status On-going      PT LONG TERM GOAL #5   Title Pt will demonstrate 120 degrees of L shoulder abduction to allow pt to reach out to the side.    Baseline 64    Status On-going      PT LONG TERM GOAL #6   Title Pt will be independent in a home exercise program for continued stretching and strengthening.    Time 6    Period Weeks    Status On-going                   Plan - 03/09/21 1459     Clinical Impression Statement Pt has more than 4 weeks of consistent conservative measures of skin care, manual lymph drainage, compression bandaging and exercise along with elevation of limb.  She has had reduction at area that has been under compression but she has increased fullness on above bandages on her arm and in her axilla and lateral chest.  She will beneift from a Flexiotouch Plus to address the lymphostatic lymphedema in these areas. Pt continues to have hyperkeratosis of skin on arm with thickening of skin and tissue at fibrotic areas.- An [phone] Lyndal Pulley basic pump was trialed and failed on 1/31 due to pain.    Examination-Activity Limitations Caring for Others;Reach Overhead;Lift    Stability/Clinical Decision Making Stable/Uncomplicated    Rehab Potential Good    PT Frequency 3x / week    PT Duration 6 weeks    PT Treatment/Interventions ADLs/Self Care Home Management;Patient/family education;Therapeutic exercise;Manual techniques;Manual lymph drainage;Compression bandaging;Orthotic Fit/Training;Passive range of motion;Vasopneumatic Device    PT Next Visit Plan cont complete CDT, use alight for garments once pt demonstrates maximal reduction did pt get garments? , gentle PROM and AAROM  exercises for L shoulder    Consulted and Agree with Plan of Care Patient  Patient will benefit from skilled therapeutic intervention in order to improve the following deficits and impairments:  Postural dysfunction, Impaired UE functional use, Increased fascial restricitons, Decreased strength, Decreased range of motion, Increased edema  Visit Diagnosis: Lymphedema, not elsewhere classified  Stiffness of left shoulder, not elsewhere classified     Problem List Patient Active Problem List   Diagnosis Date Noted   SOB (shortness of breath) 01/22/2021   HTN (hypertension) 11/11/2020   Hospice care patient 06/05/2020   Acute hypoxemic respiratory failure ([place]) 05/28/2020   Lymphedema 05/27/2020   Bilateral lower extremity edema 05/27/2020   Malignant pleural effusion 05/27/2020   Anxiety state 05/21/2020   Carcinoma of breast metastatic to bone ([place]) 03/19/2020   Protein-calorie malnutrition, severe 03/04/2020   Pressure injury of skin 02/28/2020   Bone metastases ([name]) 02/27/2020   Elevated LFTs 02/27/2020   Elevated troponin 02/27/2020   Pleural effusion 02/27/2020   Breast mass, left 02/27/2020   Sinus tachycardia 02/27/2020   Donato Heinz. Owens Shark PT  Norwood Levo, PT 03/09/2021, 4:05 PM  [facility] @ [place], [place], [address] Phone: [phone]   Fax:  [phone]  Name: JAIANA SHEFFER MRN: [id number] Date of Birth: 10-16-1940

## 2021-03-09 NOTE — Patient Instructions (Signed)
First of all, check with your insurance company to see if provider is in [place] (for wigs and compression sleeves / gloves/gauntlets )   [address] [place], [place] Phone: [phone]   Fax: [phone] Will file some insurances --- call for appointment   Second to [facility] (for mastectomy prosthetics and garments) [address] [place], [place]  Phone: [phone]  Fax: [phone] Will file some insurances --- call for appointment  [facility]  [address]  [place], [place] [address] [phone] Lower extremity garments  [facility] [address] [place], [place]  [address] [place] and [facility] (for compression garments, especilly for lower extremities) [address] [place], [place]  [address] [phone] Call for appointment    Jerrol Banana ,certified fitter [facility]  [phone]  [employer] (for mastectomy supplies and garments) [place] [place], [place] [address] [phone]  Other Resources: National Lymphedema Network:  www.lymphnet.org www.Klosetraining.com for patient articles and self manual lymph drainage information www.lymphedemablog.com has informative articles.  DishTag.es.com www.lymphedemaproducts.com www.brightlifedirect.com

## 2021-03-10 DIAGNOSIS — I890 Lymphedema, not elsewhere classified: Secondary | ICD-10-CM | POA: Diagnosis not present

## 2021-03-11 ENCOUNTER — Ambulatory Visit: Payer: Medicare HMO | Admitting: Physical Therapy

## 2021-03-11 ENCOUNTER — Other Ambulatory Visit: Payer: Self-pay

## 2021-03-11 ENCOUNTER — Encounter: Payer: Self-pay | Admitting: Physical Therapy

## 2021-03-11 DIAGNOSIS — C50912 Malignant neoplasm of unspecified site of left female breast: Secondary | ICD-10-CM | POA: Diagnosis not present

## 2021-03-11 DIAGNOSIS — M25612 Stiffness of left shoulder, not elsewhere classified: Secondary | ICD-10-CM | POA: Diagnosis not present

## 2021-03-11 DIAGNOSIS — I890 Lymphedema, not elsewhere classified: Secondary | ICD-10-CM | POA: Diagnosis not present

## 2021-03-11 DIAGNOSIS — Z170 Estrogen receptor positive status [ER+]: Secondary | ICD-10-CM | POA: Diagnosis not present

## 2021-03-11 NOTE — Therapy (Signed)
[facility] @ [place], [place], [address] Phone: [phone]   Fax:  [phone]  Physical Therapy Treatment  Patient Details  Name: Morgan Yu MRN: [id number] Date of Birth: 09-Jun-1940 Referring Provider (PT): Iruku   Encounter Date: 03/11/2021   PT End of Session - 03/11/21 1352     Visit Number 14    Number of Visits 19    Date for PT Re-Evaluation 03/18/21    PT Start Time 4818    PT Stop Time 1352    PT Time Calculation (min) 44 min    Activity Tolerance Patient tolerated treatment well    Behavior During Therapy [facility] for tasks assessed/performed             Past Medical History:  Diagnosis Date   Breast cancer ([place])    with bone metastasis    Past Surgical History:  Procedure Laterality Date   IR PERC PLEURAL DRAIN W/INDWELL CATH W/IMG GUIDE  06/03/2020   IR RADIOLOGIST EVAL & MGMT  07/31/2020   IR REMOVAL OF PLURAL CATH W/CUFF  10/31/2020    There were no vitals filed for this visit.   Subjective Assessment - 03/11/21 1359     Subjective I am running late because I was getting measured for garments.    Pertinent History breast cancer with mets, hepatic, osseous, pulmonary, and axillary nodal metastasis, axillary chest wall mets, biopsy 02/29/20, ER+ PR- HER2+, pt was receiving radiation in April 2022 and had to stop due to anxiety from fluid build up, pt had a drain placed to drain fluid from abdominal cavity from low protein, pt taking herceptin, drain has been out since June 26th, pt has had lymphedema in LUE since Jan 2021 when she was diagnosed    Patient Stated Goals to get arm swelling down    Currently in Pain? No/denies    Pain Score 0-No pain                               [facility] Adult PT Treatment/Exercise - 03/11/21 0001       Manual Therapy   Manual Lymphatic Drainage (MLD) in supine : short neck, 5 diaphragmatic breaths, right axillary nodes and  establishment of interaxillary pathway, left inguinal nodes and establishment of axillo inguinal pathway, LUE working proximal to distal then retracing all steps    Compression Bandaging thick stockinette from hand to axilla, did not do Elastomull as done last time but pt will probably need it again next session, artiflex from wrist to axilla with 1/4" gray compression foam added to dorsum of hand and ant/post forearm to help further facilitate reduction of fibrosis at distal elbow, then 1 6 cm bandage to hand and halfway up forearm (forearm done in herring bone fashion), 1 8 cm wrist to axilla in herringbone, 1 10cm from wrist to axilla,                          PT Long Term Goals - 03/09/21 1603       PT LONG TERM GOAL #1   Title Pt will demonstrate a 5 cm decrease in edema 15 cm proximal to ulnar styloid process to decrease risk of infection.    Baseline 29 cm; 03/02/21- 25.5, 03/09/2021: 23    Status Achieved      PT LONG TERM GOAL #2   Title  Pt will receive trial of FLexiTouch compression pump for long term management of lymphedema    Baseline 03/02/21- demographics sent    Time 6    Period Weeks    Status On-going      PT LONG TERM GOAL #3   Title Pt will be independent in self MLD for long term management of lymphedema.    Time 6    Period Weeks    Status On-going      PT LONG TERM GOAL #4   Title Pt will receive an appropriate compression garment for long term management of lymphedema.    Time 6    Period Weeks    Status On-going      PT LONG TERM GOAL #5   Title Pt will demonstrate 120 degrees of L shoulder abduction to allow pt to reach out to the side.    Baseline 64    Status On-going      PT LONG TERM GOAL #6   Title Pt will be independent in a home exercise program for continued stretching and strengthening.    Time 6    Period Weeks    Status On-going                   Plan - 03/11/21 1358     Clinical Impression Statement Pt was  measured for compression garments prior to her appointment here. She was measured for a flat knit sleeve and glove and a circaid profile for night time. Continued with MLD and compression bandaging today.    PT Frequency 3x / week    PT Duration 6 weeks    PT Treatment/Interventions ADLs/Self Care Home Management;Patient/family education;Therapeutic exercise;Manual techniques;Manual lymph drainage;Compression bandaging;Orthotic Fit/Training;Passive range of motion;Vasopneumatic Device    PT Next Visit Plan cont complete CDT, pt measured for garments 03/11/21 , gentle PROM and AAROM exercises for L shoulder    Consulted and Agree with Plan of Care Patient             Patient will benefit from skilled therapeutic intervention in order to improve the following deficits and impairments:  Postural dysfunction, Impaired UE functional use, Increased fascial restricitons, Decreased strength, Decreased range of motion, Increased edema  Visit Diagnosis: Lymphedema, not elsewhere classified  Stiffness of left shoulder, not elsewhere classified  Malignant neoplasm of left breast in female, estrogen receptor positive, unspecified site of breast [facility])     Problem List Patient Active Problem List   Diagnosis Date Noted   SOB (shortness of breath) 01/22/2021   HTN (hypertension) 11/11/2020   Hospice care patient 06/05/2020   Acute hypoxemic respiratory failure ([place]) 05/28/2020   Lymphedema 05/27/2020   Bilateral lower extremity edema 05/27/2020   Malignant pleural effusion 05/27/2020   Anxiety state 05/21/2020   Carcinoma of breast metastatic to bone ([place]) 03/19/2020   Protein-calorie malnutrition, severe 03/04/2020   Pressure injury of skin 02/28/2020   Bone metastases ([place]) 02/27/2020   Elevated LFTs 02/27/2020   Elevated troponin 02/27/2020   Pleural effusion 02/27/2020   Breast mass, left 02/27/2020   Sinus tachycardia 02/27/2020    [name] [place], PT 03/11/2021, 2:00  PM  [place] @ [place], [place], [address] Phone: [phone]   Fax:  [phone]  Name: Morgan Yu MRN: [id number] Date of Birth: 1940/02/25

## 2021-03-12 ENCOUNTER — Ambulatory Visit: Payer: Medicare HMO | Admitting: Physical Therapy

## 2021-03-12 ENCOUNTER — Encounter: Payer: Self-pay | Admitting: Physical Therapy

## 2021-03-12 DIAGNOSIS — I890 Lymphedema, not elsewhere classified: Secondary | ICD-10-CM

## 2021-03-12 DIAGNOSIS — M25612 Stiffness of left shoulder, not elsewhere classified: Secondary | ICD-10-CM

## 2021-03-12 DIAGNOSIS — C50912 Malignant neoplasm of unspecified site of left female breast: Secondary | ICD-10-CM

## 2021-03-12 DIAGNOSIS — Z170 Estrogen receptor positive status [ER+]: Secondary | ICD-10-CM

## 2021-03-12 NOTE — Therapy (Signed)
[place] @ [place], [place], [address] Phone: [phone]   Fax:  [phone]  Physical Therapy Treatment  Patient Details  Name: Morgan Yu MRN: [id number] Date of Birth: October 18, 1940 Referring Provider (PT): Iruku   Encounter Date: 03/12/2021   PT End of Session - 03/12/21 1352     Visit Number 15    Number of Visits 19    Date for PT Re-Evaluation 03/18/21    PT Start Time 1302    PT Stop Time 1352    PT Time Calculation (min) 50 min    Activity Tolerance Patient tolerated treatment well    Behavior During Therapy [name] for tasks assessed/performed             Past Medical History:  Diagnosis Date   Breast cancer ([place])    with bone metastasis    Past Surgical History:  Procedure Laterality Date   IR PERC PLEURAL DRAIN W/INDWELL CATH W/IMG GUIDE  06/03/2020   IR RADIOLOGIST EVAL & MGMT  07/31/2020   IR REMOVAL OF PLURAL CATH W/CUFF  10/31/2020    There were no vitals filed for this visit.   Subjective Assessment - 03/12/21 1353     Subjective The bandages did fine.    Pertinent History breast cancer with mets, hepatic, osseous, pulmonary, and axillary nodal metastasis, axillary chest wall mets, biopsy 02/29/20, ER+ PR- HER2+, pt was receiving radiation in April 2022 and had to stop due to anxiety from fluid build up, pt had a drain placed to drain fluid from abdominal cavity from low protein, pt taking herceptin, drain has been out since June 26th, pt has had lymphedema in LUE since Jan 2021 when she was diagnosed    Patient Stated Goals to get arm swelling down    Currently in Pain? No/denies    Pain Score 0-No pain                               [facility] Adult PT Treatment/Exercise - 03/12/21 0001       Manual Therapy   Manual Lymphatic Drainage (MLD) in supine : short neck, 5 diaphragmatic breaths, right axillary nodes and establishment of interaxillary pathway, left  inguinal nodes and establishment of axillo inguinal pathway, LUE working proximal to distal then retracing all steps    Compression Bandaging cocoa butter then thick stockinette from hand to axilla, did not do Elastomull as done last time but pt will probably need it again next session, artiflex from wrist to axilla with 1/4" gray compression foam added to dorsum of hand and ant/post forearm to help further facilitate reduction of fibrosis at distal elbow, then 1 6 cm bandage to hand and halfway up forearm (forearm done in herring bone fashion), 1 8 cm wrist to axilla in herringbone, 1 10cm from wrist to axilla,                          PT Long Term Goals - 03/09/21 1603       PT LONG TERM GOAL #1   Title Pt will demonstrate a 5 cm decrease in edema 15 cm proximal to ulnar styloid process to decrease risk of infection.    Baseline 29 cm; 03/02/21- 25.5, 03/09/2021: 23    Status Achieved      PT LONG TERM GOAL #2   Title Pt will receive trial  of FLexiTouch compression pump for long term management of lymphedema    Baseline 03/02/21- demographics sent    Time 6    Period Weeks    Status On-going      PT LONG TERM GOAL #3   Title Pt will be independent in self MLD for long term management of lymphedema.    Time 6    Period Weeks    Status On-going      PT LONG TERM GOAL #4   Title Pt will receive an appropriate compression garment for long term management of lymphedema.    Time 6    Period Weeks    Status On-going      PT LONG TERM GOAL #5   Title Pt will demonstrate 120 degrees of L shoulder abduction to allow pt to reach out to the side.    Baseline 64    Status On-going      PT LONG TERM GOAL #6   Title Pt will be independent in a home exercise program for continued stretching and strengthening.    Time 6    Period Weeks    Status On-going                   Plan - 03/12/21 1353     Clinical Impression Statement Continued with MLD and compression  bandaging. Pt has been measured for compression garments and is awaiting their arrival. Once she receives them therapist will assess for proper fit and compression level.    PT Frequency 3x / week    PT Duration 6 weeks    PT Treatment/Interventions ADLs/Self Care Home Management;Patient/family education;Therapeutic exercise;Manual techniques;Manual lymph drainage;Compression bandaging;Orthotic Fit/Training;Passive range of motion;Vasopneumatic Device    PT Next Visit Plan cont complete CDT, pt measured for garments 03/11/21 , gentle PROM and AAROM exercises for L shoulder    Consulted and Agree with Plan of Care Patient             Patient will benefit from skilled therapeutic intervention in order to improve the following deficits and impairments:  Postural dysfunction, Impaired UE functional use, Increased fascial restricitons, Decreased strength, Decreased range of motion, Increased edema  Visit Diagnosis: Lymphedema, not elsewhere classified  Stiffness of left shoulder, not elsewhere classified  Malignant neoplasm of left breast in female, estrogen receptor positive, unspecified site of breast [facility])     Problem List Patient Active Problem List   Diagnosis Date Noted   SOB (shortness of breath) 01/22/2021   HTN (hypertension) 11/11/2020   Hospice care patient 06/05/2020   Acute hypoxemic respiratory failure ([place]) 05/28/2020   Lymphedema 05/27/2020   Bilateral lower extremity edema 05/27/2020   Malignant pleural effusion 05/27/2020   Anxiety state 05/21/2020   Carcinoma of breast metastatic to bone ([place]) 03/19/2020   Protein-calorie malnutrition, severe 03/04/2020   Pressure injury of skin 02/28/2020   Bone metastases ([place]) 02/27/2020   Elevated LFTs 02/27/2020   Elevated troponin 02/27/2020   Pleural effusion 02/27/2020   Breast mass, left 02/27/2020   Sinus tachycardia 02/27/2020    [name] [place], PT 03/12/2021, 1:55 PM  [place] @ [place], [place], [address] Phone: [phone]   Fax:  [phone]  Name: Morgan Yu MRN: [id number] Date of Birth: [birth year]

## 2021-03-16 ENCOUNTER — Other Ambulatory Visit: Payer: Self-pay

## 2021-03-16 ENCOUNTER — Ambulatory Visit: Payer: Medicare HMO | Admitting: Physical Therapy

## 2021-03-16 ENCOUNTER — Encounter: Payer: Self-pay | Admitting: Physical Therapy

## 2021-03-16 DIAGNOSIS — M25612 Stiffness of left shoulder, not elsewhere classified: Secondary | ICD-10-CM | POA: Diagnosis not present

## 2021-03-16 DIAGNOSIS — C50912 Malignant neoplasm of unspecified site of left female breast: Secondary | ICD-10-CM | POA: Diagnosis not present

## 2021-03-16 DIAGNOSIS — I890 Lymphedema, not elsewhere classified: Secondary | ICD-10-CM

## 2021-03-16 DIAGNOSIS — Z170 Estrogen receptor positive status [ER+]: Secondary | ICD-10-CM | POA: Diagnosis not present

## 2021-03-16 NOTE — Therapy (Signed)
[facility] @ [place], [place], [address] Phone: [phone]   Fax:  [phone]  Physical Therapy Treatment  Patient Details  Name: Morgan Yu MRN: [id number] Date of Birth: 21-Mar-1940 Referring Provider (PT): Iruku   Encounter Date: 03/16/2021   PT End of Session - 03/16/21 1309     Visit Number 16    Number of Visits 19    Date for PT Re-Evaluation 03/18/21    PT Start Time 1307    PT Stop Time 1352    PT Time Calculation (min) 45 min    Activity Tolerance Patient tolerated treatment well    Behavior During Therapy [facility] for tasks assessed/performed             Past Medical History:  Diagnosis Date   Breast cancer ([place])    with bone metastasis    Past Surgical History:  Procedure Laterality Date   IR PERC PLEURAL DRAIN W/INDWELL CATH W/IMG GUIDE  06/03/2020   IR RADIOLOGIST EVAL & MGMT  07/31/2020   IR REMOVAL OF PLURAL CATH W/CUFF  10/31/2020    There were no vitals filed for this visit.   Subjective Assessment - 03/16/21 1309     Subjective I had to take the bandages off last night. They had been sliding down since Friday.    Pertinent History breast cancer with mets, hepatic, osseous, pulmonary, and axillary nodal metastasis, axillary chest wall mets, biopsy 02/29/20, ER+ PR- HER2+, pt was receiving radiation in April 2022 and had to stop due to anxiety from fluid build up, pt had a drain placed to drain fluid from abdominal cavity from low protein, pt taking herceptin, drain has been out since June 26th, pt has had lymphedema in LUE since Jan 2021 when she was diagnosed    Patient Stated Goals to get arm swelling down    Currently in Pain? No/denies    Pain Score 0-No pain                               [facility] Adult PT Treatment/Exercise - 03/16/21 0001       Manual Therapy   Manual Lymphatic Drainage (MLD) in supine : short neck, 5 diaphragmatic breaths, right axillary  nodes and establishment of interaxillary pathway, left inguinal nodes and establishment of axillo inguinal pathway, LUE working proximal to distal then retracing all steps    Compression Bandaging lotion then thick stockinette from hand to axilla, artiflex from wrist to axilla with 1/4" gray compression foam added to dorsum of hand and ant/post forearm to help further facilitate reduction of fibrosis at distal elbow, then 1 6 cm bandage to hand and halfway up forearm (forearm done in herring bone fashion), 1 8 cm wrist to axilla in herringbone, 1 10cm from wrist to axilla,                          PT Long Term Goals - 03/09/21 1603       PT LONG TERM GOAL #1   Title Pt will demonstrate a 5 cm decrease in edema 15 cm proximal to ulnar styloid process to decrease risk of infection.    Baseline 29 cm; 03/02/21- 25.5, 03/09/2021: 23    Status Achieved      PT LONG TERM GOAL #2   Title Pt will receive trial of FLexiTouch compression pump for long  term management of lymphedema    Baseline 03/02/21- demographics sent    Time 6    Period Weeks    Status On-going      PT LONG TERM GOAL #3   Title Pt will be independent in self MLD for long term management of lymphedema.    Time 6    Period Weeks    Status On-going      PT LONG TERM GOAL #4   Title Pt will receive an appropriate compression garment for long term management of lymphedema.    Time 6    Period Weeks    Status On-going      PT LONG TERM GOAL #5   Title Pt will demonstrate 120 degrees of L shoulder abduction to allow pt to reach out to the side.    Baseline 64    Status On-going      PT LONG TERM GOAL #6   Title Pt will be independent in a home exercise program for continued stretching and strengthening.    Time 6    Period Weeks    Status On-going                   Plan - 03/16/21 1353     Clinical Impression Statement Continued with MLD and compression bandaging. Pt is awaiting arrival of her  compression garments. Once she receives them and the fit and compression level are good then pt will be ready for d/c.    PT Frequency 3x / week    PT Duration 6 weeks    PT Treatment/Interventions ADLs/Self Care Home Management;Patient/family education;Therapeutic exercise;Manual techniques;Manual lymph drainage;Compression bandaging;Orthotic Fit/Training;Passive range of motion;Vasopneumatic Device    PT Next Visit Plan cont complete CDT, pt measured for garments 03/11/21 , gentle PROM and AAROM exercises for L shoulder    Consulted and Agree with Plan of Care Patient             Patient will benefit from skilled therapeutic intervention in order to improve the following deficits and impairments:  Postural dysfunction, Impaired UE functional use, Increased fascial restricitons, Decreased strength, Decreased range of motion, Increased edema  Visit Diagnosis: Lymphedema, not elsewhere classified  Stiffness of left shoulder, not elsewhere classified  Malignant neoplasm of left breast in female, estrogen receptor positive, unspecified site of breast [facility])     Problem List Patient Active Problem List   Diagnosis Date Noted   SOB (shortness of breath) 01/22/2021   HTN (hypertension) 11/11/2020   Hospice care patient 06/05/2020   Acute hypoxemic respiratory failure ([place]) 05/28/2020   Lymphedema 05/27/2020   Bilateral lower extremity edema 05/27/2020   Malignant pleural effusion 05/27/2020   Anxiety state 05/21/2020   Carcinoma of breast metastatic to bone ([place]) 03/19/2020   Protein-calorie malnutrition, severe 03/04/2020   Pressure injury of skin 02/28/2020   Bone metastases ([place]) 02/27/2020   Elevated LFTs 02/27/2020   Elevated troponin 02/27/2020   Pleural effusion 02/27/2020   Breast mass, left 02/27/2020   Sinus tachycardia 02/27/2020    Morgan Yu [employer], PT 03/16/2021, 1:56 PM  [facility] @ [place], [place], [address] Phone: [phone]   Fax:  [phone]  Name: Morgan Yu MRN: [id number] Date of Birth: 19-Nov-1940

## 2021-03-18 ENCOUNTER — Other Ambulatory Visit: Payer: Self-pay

## 2021-03-18 ENCOUNTER — Ambulatory Visit: Payer: Medicare HMO | Admitting: Physical Therapy

## 2021-03-18 ENCOUNTER — Encounter: Payer: Self-pay | Admitting: Physical Therapy

## 2021-03-18 DIAGNOSIS — M25612 Stiffness of left shoulder, not elsewhere classified: Secondary | ICD-10-CM | POA: Diagnosis not present

## 2021-03-18 DIAGNOSIS — Z170 Estrogen receptor positive status [ER+]: Secondary | ICD-10-CM | POA: Diagnosis not present

## 2021-03-18 DIAGNOSIS — C50912 Malignant neoplasm of unspecified site of left female breast: Secondary | ICD-10-CM | POA: Diagnosis not present

## 2021-03-18 DIAGNOSIS — I890 Lymphedema, not elsewhere classified: Secondary | ICD-10-CM

## 2021-03-18 NOTE — Therapy (Signed)
[facility] @ [place], [place], [address] Phone: [phone]   Fax:  [phone]  Physical Therapy Treatment  Patient Details  Name: Morgan Yu MRN: [id number] Date of Birth: 1940-03-15 Referring Provider (PT): Iruku   Encounter Date: 03/18/2021   PT End of Session - 03/18/21 1356     Visit Number 17    Number of Visits 29    Date for PT Re-Evaluation 04/15/21    PT Start Time 8372    PT Stop Time 9021    PT Time Calculation (min) 51 min    Activity Tolerance Patient tolerated treatment well    Behavior During Therapy [facility] for tasks assessed/performed             Past Medical History:  Diagnosis Date   Breast cancer ([place])    with bone metastasis    Past Surgical History:  Procedure Laterality Date   IR PERC PLEURAL DRAIN W/INDWELL CATH W/IMG GUIDE  06/03/2020   IR RADIOLOGIST EVAL & MGMT  07/31/2020   IR REMOVAL OF PLURAL CATH W/CUFF  10/31/2020    There were no vitals filed for this visit.   Subjective Assessment - 03/18/21 1306     Subjective I don't have a date set up for the trainer for the flexitouch yet.    Pertinent History breast cancer with mets, hepatic, osseous, pulmonary, and axillary nodal metastasis, axillary chest wall mets, biopsy 02/29/20, ER+ PR- HER2+, pt was receiving radiation in April 2022 and had to stop due to anxiety from fluid build up, pt had a drain placed to drain fluid from abdominal cavity from low protein, pt taking herceptin, drain has been out since June 26th, pt has had lymphedema in LUE since Jan 2021 when she was diagnosed    Patient Stated Goals to get arm swelling down    Currently in Pain? No/denies    Pain Score 0-No pain                   LYMPHEDEMA/ONCOLOGY QUESTIONNAIRE - 03/18/21 0001       Left Upper Extremity Lymphedema   10 cm Proximal to Olecranon Process 25.7 cm    Olecranon Process 23.2 cm    15 cm Proximal to Ulnar Styloid Process  22.5 cm    10 cm Proximal to Ulnar Styloid Process 19.9 cm    Just Proximal to Ulnar Styloid Process 15.9 cm    Across Hand at [employer] 16.7 cm    At [place] of 2nd Digit 6 cm                        [facility] Adult PT Treatment/Exercise - 03/18/21 0001       Manual Therapy   Edema Management remeasured circumferences    Manual Lymphatic Drainage (MLD) in supine : short neck, 5 diaphragmatic breaths, right axillary nodes and establishment of interaxillary pathway, left inguinal nodes and establishment of axillo inguinal pathway, LUE working proximal to distal then retracing all steps    Compression Bandaging lotion then thick stockinette from hand to axilla, artiflex from wrist to axilla with 1/4" gray compression foam added to dorsum of hand and ant/post forearm to help further facilitate reduction of fibrosis at distal elbow, then 1 6 cm bandage to hand and halfway up forearm (forearm done in herring bone fashion), 1 8 cm wrist to axilla in herringbone, 1 10cm from wrist to axilla,  PT Long Term Goals - 03/18/21 1307       PT LONG TERM GOAL #1   Title Pt will demonstrate a 5 cm decrease in edema 15 cm proximal to ulnar styloid process to decrease risk of infection.    Baseline 29 cm; 03/02/21- 25.5, 03/09/2021: 23    Time 6    Period Weeks    Status Achieved      PT LONG TERM GOAL #2   Title Pt will receive trial of FLexiTouch compression pump for long term management of lymphedema    Baseline 03/02/21- demographics sent; 03/18/21- pt has recieved her FlexiTouch compression pump    Time 6    Period Weeks    Status Achieved      PT LONG TERM GOAL #3   Title Pt will be independent in self MLD for long term management of lymphedema.    Time 6    Period Weeks    Status On-going      PT LONG TERM GOAL #4   Title Pt will receive an appropriate compression garment for long term management of lymphedema.    Baseline 03/02/21- pt is  still demonstrating reduction; 03/18/21-pt is awaiting arrival of her compression garments    Time 6    Period Weeks    Status On-going      PT LONG TERM GOAL #5   Title Pt will demonstrate 120 degrees of L shoulder abduction to allow pt to reach out to the side.    Baseline 64; 03/18/21- 105    Time 6    Period Weeks    Status On-going      PT LONG TERM GOAL #6   Title Pt will be independent in a home exercise program for continued stretching and strengthening.    Time 6    Period Weeks    Status On-going                   Plan - 03/18/21 1357     Clinical Impression Statement Pt returns to PT with bandages intact since last session. Removed bandages and remeasured circumferences. Pt demonstrates additional reduction at L upper arm. She is still awaiting arrival of her compression garments. Her night time garment is in and she is awaiting the day time garment. She has recieved her compression pump and is awaiting someone to come and train her on how to use it. Pt would benefit from additional skilled PT services to continue to decrease edema, assess compression garments for fit and compression level and ensure pt is able to independently manage lymphedema.    PT Frequency 3x / week    PT Duration 4 weeks    PT Treatment/Interventions ADLs/Self Care Home Management;Patient/family education;Therapeutic exercise;Manual techniques;Manual lymph drainage;Compression bandaging;Orthotic Fit/Training;Passive range of motion;Vasopneumatic Device    PT Next Visit Plan cont complete CDT, pt measured for garments 03/11/21 , gentle PROM and AAROM exercises for L shoulder    Consulted and Agree with Plan of Care Patient             Patient will benefit from skilled therapeutic intervention in order to improve the following deficits and impairments:  Postural dysfunction, Impaired UE functional use, Increased fascial restricitons, Decreased strength, Decreased range of motion, Increased  edema  Visit Diagnosis: Lymphedema, not elsewhere classified  Stiffness of left shoulder, not elsewhere classified  Malignant neoplasm of left breast in female, estrogen receptor positive, unspecified site of breast [facility])     Problem List Patient  Active Problem List   Diagnosis Date Noted   SOB (shortness of breath) 01/22/2021   HTN (hypertension) 11/11/2020   Hospice care patient 06/05/2020   Acute hypoxemic respiratory failure ([place]) 05/28/2020   Lymphedema 05/27/2020   Bilateral lower extremity edema 05/27/2020   Malignant pleural effusion 05/27/2020   Anxiety state 05/21/2020   Carcinoma of breast metastatic to bone ([place]) 03/19/2020   Protein-calorie malnutrition, severe 03/04/2020   Pressure injury of skin 02/28/2020   Bone metastases ([place]) 02/27/2020   Elevated LFTs 02/27/2020   Elevated troponin 02/27/2020   Pleural effusion 02/27/2020   Breast mass, left 02/27/2020   Sinus tachycardia 02/27/2020    [name] [place], PT 03/18/2021, 1:59 PM  [facility] @ [place], [place], [address] Phone: [phone]   Fax:  [phone]  Name: Morgan Yu MRN: [id number] Date of Birth: 1940/12/13

## 2021-03-19 ENCOUNTER — Ambulatory Visit: Payer: Medicare HMO

## 2021-03-19 ENCOUNTER — Telehealth: Payer: Self-pay

## 2021-03-19 DIAGNOSIS — Z170 Estrogen receptor positive status [ER+]: Secondary | ICD-10-CM | POA: Diagnosis not present

## 2021-03-19 DIAGNOSIS — C50912 Malignant neoplasm of unspecified site of left female breast: Secondary | ICD-10-CM

## 2021-03-19 DIAGNOSIS — M25612 Stiffness of left shoulder, not elsewhere classified: Secondary | ICD-10-CM | POA: Diagnosis not present

## 2021-03-19 DIAGNOSIS — I890 Lymphedema, not elsewhere classified: Secondary | ICD-10-CM

## 2021-03-19 NOTE — Therapy (Signed)
[facility] @ [place], [place], [address] Phone: [phone]   Fax:  [phone]  Physical Therapy Treatment  Patient Details  Name: Morgan Yu MRN: [id number] Date of Birth: 11-17-1940 Referring Provider (PT): Iruku   Encounter Date: 03/19/2021   PT End of Session - 03/19/21 1257     Visit Number 18    Number of Visits 29    Date for PT Re-Evaluation 04/15/21    PT Start Time 1301    PT Stop Time 1346    PT Time Calculation (min) 45 min    Activity Tolerance Patient tolerated treatment well    Behavior During Therapy [facility] for tasks assessed/performed             Past Medical History:  Diagnosis Date   Breast cancer ([place])    with bone metastasis    Past Surgical History:  Procedure Laterality Date   IR PERC PLEURAL DRAIN W/INDWELL CATH W/IMG GUIDE  06/03/2020   IR RADIOLOGIST EVAL & MGMT  07/31/2020   IR REMOVAL OF PLURAL CATH W/CUFF  10/31/2020    There were no vitals filed for this visit.   Subjective Assessment - 03/19/21 1351     Subjective Pt reports she has the night sleeve, but the daytime sleeve has not come. My bandages get uncomfortable at my small finger.    Pertinent History breast cancer with mets, hepatic, osseous, pulmonary, and axillary nodal metastasis, axillary chest wall mets, biopsy 02/29/20, ER+ PR- HER2+, pt was receiving radiation in April 2022 and had to stop due to anxiety from fluid build up, pt had a drain placed to drain fluid from abdominal cavity from low protein, pt taking herceptin, drain has been out since June 26th, pt has had lymphedema in LUE since Jan 2021 when she was diagnosed    Patient Stated Goals to get arm swelling down    Currently in Pain? No/denies    Pain Score 0-No pain    Multiple Pain Sites No                               [facility] Adult PT Treatment/Exercise - 03/19/21 0001       Manual Therapy   Manual Lymphatic Drainage  (MLD) in supine : short neck, 5 diaphragmatic breaths, right axillary nodes and establishment of interaxillary pathway, left inguinal nodes and establishment of axillo inguinal pathway, LUE working proximal to distal then retracing all steps    Compression Bandaging lotion then thick stockinette from hand to axilla, artiflex from wrist to axilla with 1/4" gray compression foam added to dorsum of hand and ant/post forearm to help further facilitate reduction of fibrosis at distal elbow, then 1 6 cm bandage to hand and halfway up forearm (forearm done in herring bone fashion), 1 8 cm wrist to axilla in herringbone, 1 10cm from wrist to axilla,                          PT Long Term Goals - 03/18/21 1307       PT LONG TERM GOAL #1   Title Pt will demonstrate a 5 cm decrease in edema 15 cm proximal to ulnar styloid process to decrease risk of infection.    Baseline 29 cm; 03/02/21- 25.5, 03/09/2021: 23    Time 6    Period Weeks    Status Achieved  PT LONG TERM GOAL #2   Title Pt will receive trial of FLexiTouch compression pump for long term management of lymphedema    Baseline 03/02/21- demographics sent; 03/18/21- pt has recieved her FlexiTouch compression pump    Time 6    Period Weeks    Status Achieved      PT LONG TERM GOAL #3   Title Pt will be independent in self MLD for long term management of lymphedema.    Time 6    Period Weeks    Status On-going      PT LONG TERM GOAL #4   Title Pt will receive an appropriate compression garment for long term management of lymphedema.    Baseline 03/02/21- pt is still demonstrating reduction; 03/18/21-pt is awaiting arrival of her compression garments    Time 6    Period Weeks    Status On-going      PT LONG TERM GOAL #5   Title Pt will demonstrate 120 degrees of L shoulder abduction to allow pt to reach out to the side.    Baseline 64; 03/18/21- 105    Time 6    Period Weeks    Status On-going      PT LONG TERM GOAL #6    Title Pt will be independent in a home exercise program for continued stretching and strengthening.    Time 6    Period Weeks    Status On-going                   Plan - 03/19/21 1348     Clinical Impression Statement Bandages removed and pt washed her arm. Continued MLD and compression bandaging as done previously.  She continues to have excellent reduction of edema.  She is awaiting someone to come set up her flexi touch.  She is still awaiting her compression sleeve which was ordered from [place] and is now on back order    Examination-Activity Limitations Caring for Others;Reach Overhead;Lift    Stability/Clinical Decision Making Stable/Uncomplicated    Rehab Potential Good    PT Frequency 3x / week    PT Duration 4 weeks    PT Treatment/Interventions ADLs/Self Care Home Management;Patient/family education;Therapeutic exercise;Manual techniques;Manual lymph drainage;Compression bandaging;Orthotic Fit/Training;Passive range of motion;Vasopneumatic Device    PT Next Visit Plan cont complete CDT, pt measured for garments 03/11/21 , gentle PROM and AAROM exercises for L shoulder    Consulted and Agree with Plan of Care Patient             Patient will benefit from skilled therapeutic intervention in order to improve the following deficits and impairments:  Postural dysfunction, Impaired UE functional use, Increased fascial restricitons, Decreased strength, Decreased range of motion, Increased edema  Visit Diagnosis: Lymphedema, not elsewhere classified  Stiffness of left shoulder, not elsewhere classified  Malignant neoplasm of left breast in female, estrogen receptor positive, unspecified site of breast [facility])     Problem List Patient Active Problem List   Diagnosis Date Noted   SOB (shortness of breath) 01/22/2021   HTN (hypertension) 11/11/2020   Hospice care patient 06/05/2020   Acute hypoxemic respiratory failure ([place]) 05/28/2020   Lymphedema  05/27/2020   Bilateral lower extremity edema 05/27/2020   Malignant pleural effusion 05/27/2020   Anxiety state 05/21/2020   Carcinoma of breast metastatic to bone ([place]) 03/19/2020   Protein-calorie malnutrition, severe 03/04/2020   Pressure injury of skin 02/28/2020   Bone metastases ([place]) 02/27/2020   Elevated LFTs  02/27/2020   Elevated troponin 02/27/2020   Pleural effusion 02/27/2020   Breast mass, left 02/27/2020   Sinus tachycardia 02/27/2020    Claris Pong, PT 03/19/2021, 1:53 PM  [facility] @ [place], [place], [address] Phone: [phone]   Fax:  [phone]  Name: ORELIA BRANDSTETTER MRN: [id number] Date of Birth: 1940-03-23

## 2021-03-19 NOTE — Telephone Encounter (Signed)
Spoke with patient and she declines needs for further visits. Discharged from palliative care

## 2021-03-23 ENCOUNTER — Other Ambulatory Visit: Payer: Self-pay

## 2021-03-23 ENCOUNTER — Encounter: Payer: Self-pay | Admitting: Physical Therapy

## 2021-03-23 ENCOUNTER — Ambulatory Visit: Payer: Medicare HMO | Admitting: Physical Therapy

## 2021-03-23 DIAGNOSIS — Z170 Estrogen receptor positive status [ER+]: Secondary | ICD-10-CM | POA: Diagnosis not present

## 2021-03-23 DIAGNOSIS — C50912 Malignant neoplasm of unspecified site of left female breast: Secondary | ICD-10-CM | POA: Diagnosis not present

## 2021-03-23 DIAGNOSIS — I890 Lymphedema, not elsewhere classified: Secondary | ICD-10-CM

## 2021-03-23 DIAGNOSIS — M25612 Stiffness of left shoulder, not elsewhere classified: Secondary | ICD-10-CM

## 2021-03-23 NOTE — Therapy (Signed)
[facility] @ [place], [place], [address] Phone: [phone]   Fax:  [phone]  Physical Therapy Treatment  Patient Details  Name: Morgan Yu MRN: [id number] Date of Birth: [birth year] Referring Provider (PT): Iruku   Encounter Date: 03/23/2021   PT End of Session - 03/23/21 0956     Visit Number 19   add kx modifier   Number of Visits 29    Date for PT Re-Evaluation 04/15/21    PT Start Time 0904    PT Stop Time 0952    PT Time Calculation (min) 48 min    Activity Tolerance Patient tolerated treatment well    Behavior During Therapy [facility] for tasks assessed/performed             Past Medical History:  Diagnosis Date   Breast cancer ([place])    with bone metastasis    Past Surgical History:  Procedure Laterality Date   IR PERC PLEURAL DRAIN W/INDWELL CATH W/IMG GUIDE  06/03/2020   IR RADIOLOGIST EVAL & MGMT  07/31/2020   IR REMOVAL OF PLURAL CATH W/CUFF  10/31/2020    There were no vitals filed for this visit.   Subjective Assessment - 03/23/21 0958     Subjective I brought in my old sleeves so you can take a look at them.    Pertinent History breast cancer with mets, hepatic, osseous, pulmonary, and axillary nodal metastasis, axillary chest wall mets, biopsy 02/29/20, ER+ PR- HER2+, pt was receiving radiation in April 2022 and had to stop due to anxiety from fluid build up, pt had a drain placed to drain fluid from abdominal cavity from low protein, pt taking herceptin, drain has been out since June 26th, pt has had lymphedema in LUE since Jan 2021 when she was diagnosed    Patient Stated Goals to get arm swelling down    Currently in Pain? No/denies    Pain Score 0-No pain                   LYMPHEDEMA/ONCOLOGY QUESTIONNAIRE - 03/23/21 0001       Left Upper Extremity Lymphedema   10 cm Proximal to Olecranon Process 27 cm    Olecranon Process 22.4 cm    15 cm Proximal to Ulnar Styloid  Process 22.2 cm    10 cm Proximal to Ulnar Styloid Process 18.8 cm    Just Proximal to Ulnar Styloid Process 15 cm    Across Hand at [employer] 16.7 cm    At Oval of 2nd Digit 5.6 cm                        [facility] Adult PT Treatment/Exercise - 03/23/21 0001       Manual Therapy   Edema Management circumferences taken, pt brought in old sleeves and therapist assessed them. None had sizes so it was difficult to tell if they would fit. All appeared too large for size of pt's arm and were very stretchy.    Manual Lymphatic Drainage (MLD) in supine : short neck, 5 diaphragmatic breaths, right axillary nodes and establishment of interaxillary pathway, left inguinal nodes and establishment of axillo inguinal pathway, LUE working proximal to distal then retracing all steps    Compression Bandaging lotion then thick stockinette from hand to axilla, artiflex from wrist to axilla with 1/4" gray compression foam added to dorsum of hand and ant/post forearm to help further  facilitate reduction of fibrosis at distal elbow, then 1 6 cm bandage to hand and halfway up forearm (forearm done in herring bone fashion), 1 8 cm wrist to axilla in herringbone, 1 10cm from wrist to axilla,                          PT Long Term Goals - 03/18/21 1307       PT LONG TERM GOAL #1   Title Pt will demonstrate a 5 cm decrease in edema 15 cm proximal to ulnar styloid process to decrease risk of infection.    Baseline 29 cm; 03/02/21- 25.5, 03/09/2021: 23    Time 6    Period Weeks    Status Achieved      PT LONG TERM GOAL #2   Title Pt will receive trial of FLexiTouch compression pump for long term management of lymphedema    Baseline 03/02/21- demographics sent; 03/18/21- pt has recieved her FlexiTouch compression pump    Time 6    Period Weeks    Status Achieved      PT LONG TERM GOAL #3   Title Pt will be independent in self MLD for long term management of lymphedema.    Time 6     Period Weeks    Status On-going      PT LONG TERM GOAL #4   Title Pt will receive an appropriate compression garment for long term management of lymphedema.    Baseline 03/02/21- pt is still demonstrating reduction; 03/18/21-pt is awaiting arrival of her compression garments    Time 6    Period Weeks    Status On-going      PT LONG TERM GOAL #5   Title Pt will demonstrate 120 degrees of L shoulder abduction to allow pt to reach out to the side.    Baseline 64; 03/18/21- 105    Time 6    Period Weeks    Status On-going      PT LONG TERM GOAL #6   Title Pt will be independent in a home exercise program for continued stretching and strengthening.    Time 6    Period Weeks    Status On-going                   Plan - 03/23/21 0959     Clinical Impression Statement Pt's bandages were intact since her last session on Thursday. She brought in her old compression sleeves but not had a brand or a tag so therapist was unable to assess compression level and fit. Did have pt try one of them on and it was too large. The other two were too stretchy. Educated pt to pick up her night time garment which is in at [employer]. Educated her to remove her bandages the night before she comes in and don night time garment that night. Pt still awaiting custom flat knit sleeve and glove. Continued with MLD and compression bandaging today.    PT Frequency 3x / week    PT Duration 4 weeks    PT Treatment/Interventions ADLs/Self Care Home Management;Patient/family education;Therapeutic exercise;Manual techniques;Manual lymph drainage;Compression bandaging;Orthotic Fit/Training;Passive range of motion;Vasopneumatic Device    PT Next Visit Plan cont complete CDT, pt measured for garments 03/11/21 , gentle PROM and AAROM exercises for L shoulder    Consulted and Agree with Plan of Care Patient             Patient  will benefit from skilled therapeutic intervention in order to improve the following  deficits and impairments:  Postural dysfunction, Impaired UE functional use, Increased fascial restricitons, Decreased strength, Decreased range of motion, Increased edema  Visit Diagnosis: Lymphedema, not elsewhere classified  Stiffness of left shoulder, not elsewhere classified  Malignant neoplasm of left breast in female, estrogen receptor positive, unspecified site of breast [facility])     Problem List Patient Active Problem List   Diagnosis Date Noted   SOB (shortness of breath) 01/22/2021   HTN (hypertension) 11/11/2020   Hospice care patient 06/05/2020   Acute hypoxemic respiratory failure ([place]) 05/28/2020   Lymphedema 05/27/2020   Bilateral lower extremity edema 05/27/2020   Malignant pleural effusion 05/27/2020   Anxiety state 05/21/2020   Carcinoma of breast metastatic to bone ([place]) 03/19/2020   Protein-calorie malnutrition, severe 03/04/2020   Pressure injury of skin 02/28/2020   Bone metastases ([place]) 02/27/2020   Elevated LFTs 02/27/2020   Elevated troponin 02/27/2020   Pleural effusion 02/27/2020   Breast mass, left 02/27/2020   Sinus tachycardia 02/27/2020    [name] Taylors, PT 03/23/2021, 10:01 AM  [facility] @ [place], [place], [address] Phone: [phone]   Fax:  [phone]  Name: Morgan Yu MRN: [id number] Date of Birth: March 28, 1940

## 2021-03-25 ENCOUNTER — Ambulatory Visit: Payer: Medicare HMO | Admitting: Physical Therapy

## 2021-03-25 ENCOUNTER — Other Ambulatory Visit: Payer: Self-pay

## 2021-03-25 ENCOUNTER — Encounter: Payer: Self-pay | Admitting: Physical Therapy

## 2021-03-25 DIAGNOSIS — M25612 Stiffness of left shoulder, not elsewhere classified: Secondary | ICD-10-CM | POA: Diagnosis not present

## 2021-03-25 DIAGNOSIS — Z170 Estrogen receptor positive status [ER+]: Secondary | ICD-10-CM | POA: Diagnosis not present

## 2021-03-25 DIAGNOSIS — C50912 Malignant neoplasm of unspecified site of left female breast: Secondary | ICD-10-CM

## 2021-03-25 DIAGNOSIS — I890 Lymphedema, not elsewhere classified: Secondary | ICD-10-CM | POA: Diagnosis not present

## 2021-03-25 NOTE — Therapy (Signed)
[facility] @ [place], [place], [address] Phone: [phone]   Fax:  [phone]  Physical Therapy Treatment  Patient Details  Name: JENNYFER NICKOLSON MRN: [id number] Date of Birth: 1940/10/07 Referring Provider (PT): Iruku   Encounter Date: 03/25/2021   PT End of Session - 03/25/21 1451     Visit Number 20   add kx   Number of Visits 29    Date for PT Re-Evaluation 04/15/21    PT Start Time 1350    PT Stop Time 1450    PT Time Calculation (min) 60 min    Activity Tolerance Patient tolerated treatment well    Behavior During Therapy [facility] for tasks assessed/performed             Past Medical History:  Diagnosis Date   Breast cancer ([place])    with bone metastasis    Past Surgical History:  Procedure Laterality Date   IR PERC PLEURAL DRAIN W/INDWELL CATH W/IMG GUIDE  06/03/2020   IR RADIOLOGIST EVAL & MGMT  07/31/2020   IR REMOVAL OF PLURAL CATH W/CUFF  10/31/2020    There were no vitals filed for this visit.   Subjective Assessment - 03/25/21 1456     Subjective I got my night time garment but my day time garment still has not come in.    Pertinent History breast cancer with mets, hepatic, osseous, pulmonary, and axillary nodal metastasis, axillary chest wall mets, biopsy 02/29/20, ER+ PR- HER2+, pt was receiving radiation in April 2022 and had to stop due to anxiety from fluid build up, pt had a drain placed to drain fluid from abdominal cavity from low protein, pt taking herceptin, drain has been out since June 26th, pt has had lymphedema in LUE since Jan 2021 when she was diagnosed    Patient Stated Goals to get arm swelling down    Currently in Pain? No/denies    Pain Score 0-No pain                               [facility] Adult PT Treatment/Exercise - 03/25/21 0001       Manual Therapy   Edema Management educated pt in proper donning/doffing of night time circaid profile compression  garment and assessed for proper fit    Manual Lymphatic Drainage (MLD) in supine : short neck, 5 diaphragmatic breaths, right axillary nodes and establishment of interaxillary pathway, left inguinal nodes and establishment of axillo inguinal pathway, LUE working proximal to distal then retracing all steps    Compression Bandaging lotion then thick stockinette from hand to axilla, artiflex from wrist to axilla  then 1 6 cm bandage to hand and halfway up forearm (forearm done in herring bone fashion), 1 8 cm wrist to axilla in herringbone, 1 10cm from wrist to axilla,                          PT Long Term Goals - 03/18/21 1307       PT LONG TERM GOAL #1   Title Pt will demonstrate a 5 cm decrease in edema 15 cm proximal to ulnar styloid process to decrease risk of infection.    Baseline 29 cm; 03/02/21- 25.5, 03/09/2021: 23    Time 6    Period Weeks    Status Achieved      PT LONG TERM GOAL #2  Title Pt will receive trial of FLexiTouch compression pump for long term management of lymphedema    Baseline 03/02/21- demographics sent; 03/18/21- pt has recieved her FlexiTouch compression pump    Time 6    Period Weeks    Status Achieved      PT LONG TERM GOAL #3   Title Pt will be independent in self MLD for long term management of lymphedema.    Time 6    Period Weeks    Status On-going      PT LONG TERM GOAL #4   Title Pt will receive an appropriate compression garment for long term management of lymphedema.    Baseline 03/02/21- pt is still demonstrating reduction; 03/18/21-pt is awaiting arrival of her compression garments    Time 6    Period Weeks    Status On-going      PT LONG TERM GOAL #5   Title Pt will demonstrate 120 degrees of L shoulder abduction to allow pt to reach out to the side.    Baseline 64; 03/18/21- 105    Time 6    Period Weeks    Status On-going      PT LONG TERM GOAL #6   Title Pt will be independent in a home exercise program for continued  stretching and strengthening.    Time 6    Period Weeks    Status On-going                   Plan - 03/25/21 1452     Clinical Impression Statement Pt received her night time circaid profile garment. Educated pt in proper donning and doffing and had pt return demonstrate. She had some difficulty but was able to do it. Her day time garment is still not in. Pt was educated to remove bandages Thursday evening and use her compression pump and then don her night time garment prior to her appointment here the next morning to see if she has any trouble. Continued with MLD today and compression bandaging. Pt will be ready for d/c once she receives her day time garments and we can ensure they control her edema.    PT Frequency 3x / week    PT Duration 4 weeks    PT Treatment/Interventions ADLs/Self Care Home Management;Patient/family education;Therapeutic exercise;Manual techniques;Manual lymph drainage;Compression bandaging;Orthotic Fit/Training;Passive range of motion;Vasopneumatic Device    PT Next Visit Plan how was pump and night time garment last night? cont complete CDT, pt measured for garments 03/11/21 , gentle PROM and AAROM exercises for L shoulder    Consulted and Agree with Plan of Care Patient             Patient will benefit from skilled therapeutic intervention in order to improve the following deficits and impairments:  Postural dysfunction, Impaired UE functional use, Increased fascial restricitons, Decreased strength, Decreased range of motion, Increased edema  Visit Diagnosis: Lymphedema, not elsewhere classified  Stiffness of left shoulder, not elsewhere classified  Malignant neoplasm of left breast in female, estrogen receptor positive, unspecified site of breast [facility])     Problem List Patient Active Problem List   Diagnosis Date Noted   SOB (shortness of breath) 01/22/2021   HTN (hypertension) 11/11/2020   Hospice care patient 06/05/2020   Acute hypoxemic  respiratory failure ([place]) 05/28/2020   Lymphedema 05/27/2020   Bilateral lower extremity edema 05/27/2020   Malignant pleural effusion 05/27/2020   Anxiety state 05/21/2020   Carcinoma of breast metastatic to bone ([name])  03/19/2020   Protein-calorie malnutrition, severe 03/04/2020   Pressure injury of skin 02/28/2020   Bone metastases ([place]) 02/27/2020   Elevated LFTs 02/27/2020   Elevated troponin 02/27/2020   Pleural effusion 02/27/2020   Breast mass, left 02/27/2020   Sinus tachycardia 02/27/2020    [name] Tarlton, PT 03/25/2021, 2:56 PM  [facility] @ [place], [place], [address] Phone: [phone]   Fax:  [phone]  Name: GENNY CAULDER MRN: [id number] Date of Birth: [birth year]

## 2021-03-26 ENCOUNTER — Ambulatory Visit (HOSPITAL_COMMUNITY)
Admission: RE | Admit: 2021-03-26 | Discharge: 2021-03-26 | Disposition: A | Discharge status: Home / Self Care | Payer: Medicare HMO | Source: Ambulatory Visit | Attending: Internal Medicine | Admitting: Internal Medicine

## 2021-03-26 ENCOUNTER — Ambulatory Visit (HOSPITAL_BASED_OUTPATIENT_CLINIC_OR_DEPARTMENT_OTHER)
Admission: RE | Admit: 2021-03-26 | Discharge: 2021-03-26 | Disposition: A | Discharge status: Home / Self Care | Payer: Medicare HMO | Source: Ambulatory Visit | Attending: Internal Medicine | Admitting: Internal Medicine

## 2021-03-26 ENCOUNTER — Encounter (HOSPITAL_COMMUNITY): Payer: Self-pay | Admitting: Internal Medicine

## 2021-03-26 VITALS — BP 170/80 | HR 76 | Ht 61.0 in | Wt 126.0 lb

## 2021-03-26 DIAGNOSIS — C7951 Secondary malignant neoplasm of bone: Secondary | ICD-10-CM

## 2021-03-26 DIAGNOSIS — Z0189 Encounter for other specified special examinations: Secondary | ICD-10-CM | POA: Diagnosis not present

## 2021-03-26 DIAGNOSIS — I700 Atherosclerosis of aorta: Secondary | ICD-10-CM | POA: Insufficient documentation

## 2021-03-26 DIAGNOSIS — I5040 Unspecified combined systolic (congestive) and diastolic (congestive) heart failure: Secondary | ICD-10-CM | POA: Insufficient documentation

## 2021-03-26 DIAGNOSIS — C50912 Malignant neoplasm of unspecified site of left female breast: Secondary | ICD-10-CM

## 2021-03-26 DIAGNOSIS — I427 Cardiomyopathy due to drug and external agent: Secondary | ICD-10-CM

## 2021-03-26 DIAGNOSIS — T451X5A Adverse effect of antineoplastic and immunosuppressive drugs, initial encounter: Secondary | ICD-10-CM | POA: Diagnosis not present

## 2021-03-26 DIAGNOSIS — I110 Hypertensive heart disease with heart failure: Secondary | ICD-10-CM | POA: Insufficient documentation

## 2021-03-26 DIAGNOSIS — I10 Essential (primary) hypertension: Secondary | ICD-10-CM

## 2021-03-26 DIAGNOSIS — R0602 Shortness of breath: Secondary | ICD-10-CM | POA: Insufficient documentation

## 2021-03-26 DIAGNOSIS — Z01818 Encounter for other preprocedural examination: Secondary | ICD-10-CM | POA: Diagnosis not present

## 2021-03-26 DIAGNOSIS — R9431 Abnormal electrocardiogram [ECG] [EKG]: Secondary | ICD-10-CM | POA: Diagnosis not present

## 2021-03-26 DIAGNOSIS — R609 Edema, unspecified: Secondary | ICD-10-CM | POA: Diagnosis not present

## 2021-03-26 DIAGNOSIS — R000 Tachycardia, unspecified: Secondary | ICD-10-CM | POA: Insufficient documentation

## 2021-03-26 LAB — ECHOCARDIOGRAM COMPLETE
Area-P 1/2: 3.53 cm2
Calc EF: 51.2 %
S' Lateral: 2.9 cm
Single Plane A2C EF: 48.3 %
Single Plane A4C EF: 50.4 %

## 2021-03-26 MED ORDER — ENTRESTO 49-51 MG PO TABS: 1.0000 | ORAL_TABLET | Freq: Two times a day (BID) | ORAL | 3 refills | Status: DC

## 2021-03-26 MED ORDER — CARVEDILOL 6.25 MG PO TABS: 6.2500 mg | ORAL_TABLET | Freq: Two times a day (BID) | ORAL | 3 refills | Status: DC

## 2021-03-26 NOTE — Progress Notes (Signed)
CARDIO-ONCOLOGY CLINIC CONSULT NOTE  Referring Physician: Dr. Chryl Heck Primary Care: Pcp, No Primary Cardiologist: New  HPI:  Ms. Riesgo is 81 y.o. female with h/o HTN (new diagnosis)  with stage IV left breast cancer referred by Dr. Chryl Heck for enrollment into the Cardio-Oncology program due to possible chemotherapy-induced cardiomyopathy.   Carcinoma of breast metastatic to bone ([place])  03/19/2020 Initial Diagnosis    Carcinoma of breast metastatic to bone ([place])    03/24/2020 -  Chemotherapy    Patient is on Treatment Plan : BREAST Trastuzumab q21d     04/15/2020 Cancer Staging    Staging form: Breast, AJCC 8th Edition - Clinical stage from 04/15/2020: Stage IV (cTX, cNX, cM1, GX, ER+, PR-, HER2+) - Signed by [name], MD on 04/15/2020 Stage prefix: Initial diagnosis Histologic grading system: 3 grade system       Has been very healthy. No real past medical issues. Diagnosed with left breast breast CA in 1/22 with mets to bone. Started on herceptin in 2/22 and arimidex. Also had XRT to left arm lymph nodes. Has been on Herceptin q3 weeks x March as monotherapy + Zometa  In 4/22 admitted with respiratory failure and LE edema. Found to have bilateral pleural effusions. Underwent thorcenteses and placement of Pleurex tube on left. Effusions negative for malignant cells. Pleurex removed in May. Remains on po lasix with resolution of LE edema. Hypoalbuminemia felt to be playing a role. Echo 05/28/20 EF 60-65%.  Received palliative radiation to the left axilla from 05/28/2020-06/25/2020  Echo 12/22 EF 40-45%. Herceptin held.   Overall feeling fine. Denies SOB/PND/Orthopnea. She is very active and very busy on a daily basis. Followed in the lymph edema clinic twice a day. Appetite ok. No fever or chills. Taking all medications.   Echo today 03/26/21 EF 55-60% GLS -18.1   ECHO (01/15/21) EF 40-45% Echo 10/20/20 EF 60-65%  Echo 05/28/20 EF 60-65%    Past Medical History:  Diagnosis Date    Breast cancer ([place])    with bone metastasis    Current Outpatient Medications  Medication Sig Dispense Refill   anastrozole (ARIMIDEX) 1 MG tablet Take 1 tablet (1 mg total) by mouth daily. 30 tablet 11   carvedilol (COREG) 3.125 MG tablet Take 1 tablet (3.125 mg total) by mouth 2 (two) times daily. 180 tablet 3   Chlorphen-Pseudoephed-APAP (TYLENOL ALLERGY SINUS PO) Take 1 tablet by mouth daily as needed (allergy).     oxymetazoline (AFRIN) 0.05 % nasal spray Place 1 spray into both nostrils daily as needed for congestion.     sacubitril-valsartan (ENTRESTO) 24-26 MG Take 1 tablet by mouth 2 (two) times daily. 180 tablet 3   furosemide (LASIX) 20 MG tablet Take 1 tablet (20 mg total) by mouth daily. (Patient not taking: Reported on 03/26/2021) 30 tablet 0   No current facility-administered medications for this encounter.    Allergies  Allergen Reactions   Vicodin Hp [Hydrocodone-Acetaminophen]     Brain fog      Social History   Socioeconomic History   Marital status: Widowed    Spouse name: Not on file   Number of children: Not on file   Years of education: Not on file   Highest education level: Not on file  Occupational History   Not on file  Tobacco Use   Smoking status: Never   Smokeless tobacco: Never  Vaping Use   Vaping Use: Never used  Substance and Sexual Activity   Alcohol use: Not Currently  Drug use: Never   Sexual activity: Not on file  Other Topics Concern   Not on file  Social History Narrative   No biological children   Has 2 stepchildren   Social Determinants of Health   Financial Resource Strain: Not on file  Food Insecurity: Not on file  Transportation Needs: Not on file  Physical Activity: Not on file  Stress: Not on file  Social Connections: Not on file  Intimate Partner Violence: Not on file      Family History  Problem Relation Age of Onset   Stroke Mother    Heart attack Father    Cancer Brother    Esophageal cancer Maternal  Aunt    Breast cancer Maternal Aunt     Vitals:   03/26/21 1355  BP: (!) 170/80  Pulse: 76  SpO2: 98%  Weight: 57.2 kg (126 lb)  Height: 5' 1"  (1.549 m)    PHYSICAL EXAM: General:  Well appearing. No resp difficulty HEENT: normal Neck: supple. no JVD. Carotids 2+ bilat; no bruits. No lymphadenopathy or thryomegaly appreciated. Cor: PMI nondisplaced. Regular rate & rhythm. No rubs, gallops or murmurs. Lungs: clear Abdomen: soft, nontender, nondistended. No hepatosplenomegaly. No bruits or masses. Good bowel sounds. Extremities: no cyanosis, clubbing, rash, edema. LUE compression sleeve.  Neuro: alert & orientedx3, cranial nerves grossly intact. moves all 4 extremities w/o difficulty. Affect pleasant     ASSESSMENT & PLAN:  1. Chemotherapy-induced cardiomyopathy  - suspect she has herceptin-induced cardiomyopathy that is now resolved with holding Herceptin - started herceptin 3/22 - Echo 05/28/20 EF 60-65% - Echo 10/20/20 EF 60-65% - 01/15/21 EF 40-45% -> herceptin held  - Echo today 55-60% GLS -18.1  - Explained incidence and natural history of Herceptin cardiotoxicity (~10%) and role of Cardio-oncology clinic at length including role of scheduled interruptions. - Increase entresto 49-51 mg twice a day.  - Continue carvedilol 3.125 bid for cardio-protection - Resume Herceptin - Repeat echo in 2 months  2. Carcinoma of left breast metastatic to bone ([facility]) - diagnosesd2/22 ER positive, PR negative and HER2 amplified breast cancer currently on Arimidex and Herceptin    3. HTN - Elevated.  - Increased entesto to 49-51 mg twice a day.   4. Pleural effusions - s/p previous pleurex  Follow up in 3 months with an ECHO.   Darrick Grinder, NP  2:10 PM  Patient seen and examined with the above-signed Advanced Practice Provider and/or Housestaff. I personally reviewed laboratory data, imaging studies and relevant notes. I independently examined the patient and formulated the  important aspects of the plan. I have edited the note to reflect any of my changes or salient points. I have personally discussed the plan with the patient and/or family.  81 y/o woman with stage IV breast CA. Started on Herceptin in 3/22.EF at time 60-65%. Echo 12/22 EF 40-45% so herceptin held and started on Entresto and carvedilol. Echo today with EF 55-60% Feels ok. BP high  General:  Elderly No resp difficulty HEENT: normal Neck: supple. no JVD. Carotids 2+ bilat; no bruits. No lymphadenopathy or thryomegaly appreciated. Cor: PMI nondisplaced. Regular rate & rhythm. No rubs, gallops or murmurs. Lungs: clear Abdomen: soft, nontender, nondistended. No hepatosplenomegaly. No bruits or masses. Good bowel sounds. Extremities: no cyanosis, clubbing, rash, edema Neuro: alert & orientedx3, cranial nerves grossly intact. moves all 4 extremities w/o difficulty. Affect pleasant   Suspect she has herceptin-induced cardiomyopathy that is now resolved with holding Herceptin. Explained incidence and natural history  of Herceptin cardiotoxicity (~10%) and role of Cardio-oncology clinic at length including role of scheduled interruptions.  Can resume Herceptin. Increase Entresto to 49/51 and continue carvedilol. Repeat echo in 2 months with visit here.  Glori Bickers, MD  11:07 PM

## 2021-03-26 NOTE — Progress Notes (Signed)
°  Echocardiogram 2D Echocardiogram has been performed.  Bobbye Charleston 03/26/2021, 1:52 PM

## 2021-03-26 NOTE — Patient Instructions (Signed)
Thank you for your visit today.  Increase your Carvedilol to 6.25 mg Twice daily  Increase your Entresto to 49/51 Twice daily  Your physician has requested that you have an echocardiogram. Echocardiography is a painless test that uses sound waves to create images of your heart. It provides your doctor with information about the size and shape of your heart and how well your hearts chambers and valves are working. This procedure takes approximately one hour. There are no restrictions for this procedure.  Your physician recommends that you schedule a follow-up appointment in: 3 months  If you have any questions or concerns before your next appointment please send [place] a message through [place] or call our office at [phone].    TO LEAVE A MESSAGE FOR THE NURSE SELECT OPTION 2, PLEASE LEAVE A MESSAGE INCLUDING: YOUR NAME DATE OF BIRTH CALL BACK NUMBER REASON FOR CALL**this is important as we prioritize the call backs  YOU WILL RECEIVE A CALL BACK THE SAME DAY AS LONG AS YOU CALL BEFORE 4:00 PM  At the [facility], you and your health needs are our priority. As part of our continuing mission to provide you with exceptional heart care, we have created designated Provider Care Teams. These Care Teams include your primary Cardiologist (physician) and Advanced Practice Providers (APPs- Physician Assistants and Nurse Practitioners) who all work together to provide you with the care you need, when you need it.   You may see any of the following providers on your designated Care Team at your next follow up: Dr Glori Bickers Dr Haynes Kerns, NP Lyda Jester, [place] [facility] [place], [place] Audry Riles, PharmD   Please be sure to bring in all your medications bottles to every appointment.

## 2021-03-27 ENCOUNTER — Inpatient Hospital Stay: Payer: Medicare HMO

## 2021-03-27 ENCOUNTER — Other Ambulatory Visit: Payer: Self-pay

## 2021-03-27 ENCOUNTER — Encounter: Payer: Self-pay | Admitting: Hematology and Oncology

## 2021-03-27 ENCOUNTER — Ambulatory Visit: Payer: Medicare HMO

## 2021-03-27 ENCOUNTER — Other Ambulatory Visit: Payer: Self-pay | Admitting: Hematology and Oncology

## 2021-03-27 ENCOUNTER — Inpatient Hospital Stay: Payer: Medicare HMO | Attending: Hematology and Oncology | Admitting: Hematology and Oncology

## 2021-03-27 VITALS — BP 162/67 | HR 77 | Temp 97.9°F | Resp 18 | Ht 61.0 in | Wt 127.1 lb

## 2021-03-27 DIAGNOSIS — C7800 Secondary malignant neoplasm of unspecified lung: Secondary | ICD-10-CM | POA: Insufficient documentation

## 2021-03-27 DIAGNOSIS — C50912 Malignant neoplasm of unspecified site of left female breast: Secondary | ICD-10-CM

## 2021-03-27 DIAGNOSIS — M25612 Stiffness of left shoulder, not elsewhere classified: Secondary | ICD-10-CM

## 2021-03-27 DIAGNOSIS — I890 Lymphedema, not elsewhere classified: Secondary | ICD-10-CM | POA: Diagnosis not present

## 2021-03-27 DIAGNOSIS — C7951 Secondary malignant neoplasm of bone: Secondary | ICD-10-CM | POA: Insufficient documentation

## 2021-03-27 DIAGNOSIS — C773 Secondary and unspecified malignant neoplasm of axilla and upper limb lymph nodes: Secondary | ICD-10-CM | POA: Diagnosis not present

## 2021-03-27 DIAGNOSIS — C50919 Malignant neoplasm of unspecified site of unspecified female breast: Secondary | ICD-10-CM

## 2021-03-27 DIAGNOSIS — Z170 Estrogen receptor positive status [ER+]: Secondary | ICD-10-CM | POA: Insufficient documentation

## 2021-03-27 DIAGNOSIS — C787 Secondary malignant neoplasm of liver and intrahepatic bile duct: Secondary | ICD-10-CM | POA: Diagnosis not present

## 2021-03-27 LAB — BASIC METABOLIC PANEL - CANCER CENTER ONLY
Anion gap: 8 (ref 5–15)
BUN: 19 mg/dL (ref 8–23)
CO2: 27 mmol/L (ref 22–32)
Calcium: 10.1 mg/dL (ref 8.9–10.3)
Chloride: 104 mmol/L (ref 98–111)
Creatinine: 0.91 mg/dL (ref 0.44–1.00)
GFR, Estimated: >60 mL/min (ref >60)
Glucose, Bld: 135 mg/dL — ABNORMAL HIGH (ref 70–99)
Potassium: 4.3 mmol/L (ref 3.5–5.1)
Sodium: 139 mmol/L (ref 135–145)

## 2021-03-27 MED ORDER — ANASTROZOLE 1 MG PO TABS: 1.0000 mg | ORAL_TABLET | Freq: Every day | ORAL | 11 refills | Status: AC

## 2021-03-27 NOTE — Progress Notes (Unsigned)
[place]   [phone] [phone]   Clinic Follow up Note   Patient Care Team: Pcp, No as PCP - Sherryl Manges, MD as Consulting Physician (Hematology and Oncology) 03/27/2021  CHIEF COMPLAINT: Stage IV left breast cancer, ER+ / PR- / Her2+, grade 3  SUMMARY OF ONCOLOGIC HISTORY: Oncology History  Bone metastases ([place])  02/27/2020 Initial Diagnosis   Bone metastases ([place])   Carcinoma of breast metastatic to bone ([place])  03/19/2020 Initial Diagnosis   Carcinoma of breast metastatic to bone ([place])   03/24/2020 -  Chemotherapy   Patient is on Treatment Plan : BREAST Trastuzumab q21d     04/15/2020 Cancer Staging   Staging form: Breast, AJCC 8th Edition - Clinical stage from 04/15/2020: Stage IV (cTX, cNX, cM1, GX, ER+, PR-, HER2+) - Signed by [name], MD on 04/15/2020 Stage prefix: Initial diagnosis Histologic grading system: 3 grade system      CURRENT THERAPY:  Anastrozole 1 mg p.o. daily Herceptin/Kanjinti q3 weeks Zometa q3 months  INTERVAL HISTORY:   Morgan Yu returns for follow-up. Since last visit, she continues to do well.   She is doing out side work, shopping , cooking, staying active. She denies any chest pain, shortness of breath. She says Dr Haroldine Laws cleared her to restart her herceptin, however the NP's note says to hold it , so we have to clarify. She continues on anastrazole, no side effects  Rest of the pertinent 10 point ROS reviewed and negative.   MEDICAL HISTORY:  Past Medical History:  Diagnosis Date   Breast cancer [facility])    with bone metastasis    SURGICAL HISTORY: Past Surgical History:  Procedure Laterality Date   IR PERC PLEURAL DRAIN W/INDWELL CATH W/IMG GUIDE  06/03/2020   IR RADIOLOGIST EVAL & MGMT  07/31/2020   IR REMOVAL OF PLURAL CATH W/CUFF  10/31/2020    I have reviewed the social history and family history with the patient and they are unchanged from previous  note.  ALLERGIES:  is allergic to vicodin hp [hydrocodone-acetaminophen].  MEDICATIONS:  Current Outpatient Medications  Medication Sig Dispense Refill   anastrozole (ARIMIDEX) 1 MG tablet Take 1 tablet (1 mg total) by mouth daily. 30 tablet 11   carvedilol (COREG) 6.25 MG tablet Take 1 tablet (6.25 mg total) by mouth 2 (two) times daily. 180 tablet 3   Chlorphen-Pseudoephed-APAP (TYLENOL ALLERGY SINUS PO) Take 1 tablet by mouth daily as needed (allergy).     furosemide (LASIX) 20 MG tablet Take 1 tablet (20 mg total) by mouth daily. (Patient not taking: Reported on 03/26/2021) 30 tablet 0   oxymetazoline (AFRIN) 0.05 % nasal spray Place 1 spray into both nostrils daily as needed for congestion.     sacubitril-valsartan (ENTRESTO) 49-51 MG Take 1 tablet by mouth 2 (two) times daily. 180 tablet 3   No current facility-administered medications for this visit.   Review of Systems  Constitutional:  Negative for chills, fever, malaise/fatigue and weight loss.  HENT:  Negative for sore throat.   Respiratory:  Negative for cough, shortness of breath and wheezing.   Cardiovascular:  Negative for chest pain, palpitations and leg swelling.  Gastrointestinal:  Negative for abdominal pain, blood in stool, constipation, diarrhea, melena, nausea and vomiting.  Musculoskeletal:  Negative for myalgias.  Skin:  Negative for itching and rash.    PHYSICAL EXAMINATION: ECOG PERFORMANCE STATUS: 0 - Asymptomatic  There were no vitals filed for this visit.  There were no vitals  filed for this visit.  Physical exam: ECOG performance status: 0 HENT: Normocephalic and atraumatic. No mucositis. Mouth is moist.  CARDIO: Normal rate and regular rhythm. Normal heart sounds PULM: Normal breath sounds. No wheezing, rales and rhonchi.  ABD: Abdomen is flat, soft and nontender.  MSK: LUE lymphedema with compression sleeve on. Normal range of motion. SKIN: Warm and dry. NEURO: No focal deficits. Alert and  oriented x 3.  BREAST: Left breast has stable skin changes with erythema and nodularity Since last visit, no changes in physical exam.  LABORATORY DATA:  I have reviewed the data as listed CBC Latest Ref Rng & Units 02/18/2021 01/21/2021 12/31/2020  WBC 4.0 - 10.5 K/uL 7.3 5.6 6.2  Hemoglobin 12.0 - 15.0 g/dL 12.7 12.2 12.9  Hematocrit 36.0 - 46.0 % 37.7 37.4 39.0  Platelets 150 - 400 K/uL 203 200 175     CMP Latest Ref Rng & Units 03/27/2021 02/18/2021 01/21/2021  Glucose 70 - 99 mg/dL 135(H) 154(H) 186(H)  BUN 8 - 23 mg/dL _0 Creatinine 0.44 - 1.00 mg/dL 0.91 0.80 0.99  Sodium 135 - 145 mmol/L 139 140 140  Potassium 3.5 - 5.1 mmol/L 4.3 4.1 4.0  Chloride 98 - 111 mmol/L 104 104 104  CO2 22 - 32 mmol/L _1 Calcium 8.9 - 10.3 mg/dL 10.1 9.8 9.9  Total Protein 6.5 - 8.1 g/dL - - -  Total Bilirubin 0.3 - 1.2 mg/dL - - -  Alkaline Phos 38 - 126 U/L - - -  AST 15 - 41 U/L - - -  ALT 0 - 44 U/L - - -   Most recent echocardiogram in December 2022 showed left ventricular ejection fraction of 35 to 40% this is significantly different compared to the ejection fraction noticed before in September 2022.  Left ventricle demonstrates global hypokinesis.  Grade 1 diastolic dysfunction noted.  Right ventricular systolic function is normal.  No other major valvular abnormality noted. Echo in September 2022 showed ejection fraction of 60 to 65%, normal left ventricular function.    RADIOGRAPHIC STUDIES: I have personally reviewed the radiological images as listed and agreed with the findings in the report. ECHOCARDIOGRAM COMPLETE  Result Date: 03/26/2021    ECHOCARDIOGRAM REPORT   Patient Name:   Morgan Yu Date of Exam: 03/26/2021 Medical Rec #:  [id number]      Height:       61.0 in Accession #:    [id number]     Weight:       126.4 lb Date of Birth:  [birth year]       BSA:          1.554 m Patient Age:    41 years       BP:           175/77 mmHg Patient Gender: F              HR:            80 bpm. Exam Location:  Outpatient Procedure: 2D Echo, 3D Echo, Cardiac Doppler, Color Doppler and Strain Analysis Indications:    I50.40* Unspecified combined systolic (congestive) and diastolic                 (congestive) heart failure  History:        Patient has prior history of Echocardiogram examinations, most                 recent 01/15/2021. Abnormal ECG,  Arrythmias:Tachycardia,                 Signs/Symptoms:Shortness of Breath and Dyspnea; Risk                 Factors:Hypertension. Metastatic cancer. Edema.  Sonographer:    Roseanna Rainbow RDCS Referring Phys: [id number] DANIEL R BENSIMHON  Sonographer Comments: Technically difficult study due to poor echo windows. Global longitudinal strain was attempted. IMPRESSIONS  1. Left ventricular ejection fraction, by estimation, is 55 to 60%. The left ventricle has normal function. The left ventricle has no regional wall motion abnormalities. Left ventricular diastolic parameters are consistent with Grade I diastolic dysfunction (impaired relaxation). The average left ventricular global longitudinal strain is -18.1 %. The global longitudinal strain is normal.  2. Right ventricular systolic function is normal. The right ventricular size is normal.  3. Left atrial size was mildly dilated.  4. The mitral valve is normal in structure. Trivial mitral valve regurgitation. No evidence of mitral stenosis.  5. The aortic valve is tricuspid. There is mild calcification of the aortic valve. Aortic valve regurgitation is not visualized. Aortic valve sclerosis/calcification is present, without any evidence of aortic stenosis.  6. The inferior vena cava is normal in size with greater than 50% respiratory variability, suggesting right atrial pressure of 3 mmHg. FINDINGS  Left Ventricle: Left ventricular ejection fraction, by estimation, is 55 to 60%. The left ventricle has normal function. The left ventricle has no regional wall motion abnormalities. The average left ventricular  global longitudinal strain is -18.1 %. The global longitudinal strain is normal. The left ventricular internal cavity size was normal in size. There is no left ventricular hypertrophy. Left ventricular diastolic parameters are consistent with Grade I diastolic dysfunction (impaired relaxation). Right Ventricle: The right ventricular size is normal. No increase in right ventricular wall thickness. Right ventricular systolic function is normal. Left Atrium: Left atrial size was mildly dilated. Right Atrium: Right atrial size was normal in size. Pericardium: There is no evidence of pericardial effusion. Mitral Valve: The mitral valve is normal in structure. Mild mitral annular calcification. Trivial mitral valve regurgitation. No evidence of mitral valve stenosis. Tricuspid Valve: The tricuspid valve is normal in structure. Tricuspid valve regurgitation is trivial. No evidence of tricuspid stenosis. Aortic Valve: The aortic valve is tricuspid. There is mild calcification of the aortic valve. Aortic valve regurgitation is not visualized. Aortic valve sclerosis/calcification is present, without any evidence of aortic stenosis. Pulmonic Valve: The pulmonic valve was grossly normal. Pulmonic valve regurgitation is trivial. No evidence of pulmonic stenosis. Aorta: The aortic root is normal in size and structure. Venous: The inferior vena cava is normal in size with greater than 50% respiratory variability, suggesting right atrial pressure of 3 mmHg. IAS/Shunts: No atrial level shunt detected by color flow Doppler.  LEFT VENTRICLE PLAX 2D LVIDd:         4.00 cm     Diastology LVIDs:         2.90 cm     LV e' medial:    5.55 cm/s LV PW:         0.90 cm     LV E/e' medial:  15.0 LV IVS:        0.90 cm     LV e' lateral:   8.16 cm/s LVOT diam:     1.90 cm     LV E/e' lateral: 10.2 LV SV:         57 LV SV Index:   36  2D Longitudinal Strain LVOT Area:     2.84 cm    2D Strain GLS Avg:     -18.1 %  LV Volumes (MOD) LV  vol d, MOD A2C: 66.0 ml LV vol d, MOD A4C: 54.2 ml LV vol s, MOD A2C: 34.1 ml LV vol s, MOD A4C: 26.9 ml LV SV MOD A2C:     31.9 ml LV SV MOD A4C:     54.2 ml LV SV MOD BP:      31.9 ml RIGHT VENTRICLE            IVC RV S prime:     9.77 cm/s  IVC diam: 1.60 cm TAPSE (M-mode): 1.5 cm LEFT ATRIUM             Index        RIGHT ATRIUM          Index LA diam:        3.40 cm 2.19 cm/m   RA Area:     8.30 cm LA Vol (A2C):   27.8 ml 17.89 ml/m  RA Volume:   14.80 ml 9.52 ml/m LA Vol (A4C):   19.6 ml 12.61 ml/m LA Biplane Vol: 23.9 ml 15.38 ml/m  AORTIC VALVE LVOT Vmax:   98.00 cm/s LVOT Vmean:  63.400 cm/s LVOT VTI:    0.200 m  AORTA Ao Root diam: 2.90 cm Ao Asc diam:  2.90 cm MITRAL VALVE MV Area (PHT): 3.53 cm     SHUNTS MV Decel Time: 215 msec     Systemic VTI:  0.20 m MV E velocity: 83.30 cm/s   Systemic Diam: 1.90 cm MV A velocity: 123.00 cm/s MV E/A ratio:  0.68 Glori Bickers MD Electronically signed by Glori Bickers MD Signature Date/Time: 03/26/2021/2:29:04 PM    Final      ASSESSMENT & PLAN: 81 year old female  # Metastatic breast cancer  -CT evidence of hepatic, osseous, pulmonary, and axillary nodal metastatic disease  -Biopsy 02/29/2020 left axillary/chest wall mass confirmed metastatic carcinoma consistent with breast primary, ER +50% weak, PR negative, HER2 positive 3+.  She is currently on trastuzumab every 21 days started in February 2022, anastrozole 1 mg p.o. daily and Zometa for bone strengthening. -Received palliative radiation to the left axilla from 05/28/2020-06/25/2020 -Last echocardiogram showed an ejection fraction drop of almost 20 to 25%.  We held trastuzumab and referred her to cardiology for further recommendations.  Last CT imaging with no evidence of worsening disease.  Dr. Haroldine Laws has started her on carvedilol and Entresto for cardioprotection.  He is planning to repeat echocardiogram in mid February and will base his recommendations based on the improvement in echo.   Unfortunately we cannot resume any cardiotoxic treatment at this time until ejection fraction improves and until we receive further recommendations from cardiology.  She will continue with anastrozole daily and Zometa every 3 months, we will plan to repeat imaging in March 2023. No concern for disease progression on examination today. Proceed with cardiology follow-up as scheduled next week.  Return to clinic with [place] in 4 weeks.  #LUE Lymphedema: -- She will be doing physical therapy thrice a week starting January.   All questions were answered. The patient knows to call the clinic with any problems, questions or concerns. No barriers to learning were detected.  I have spent a total of 30 minutes minutes of face-to-face and non-face-to-face time, preparing to see the patient, performing a medically appropriate examination, counseling and educating the patient, referring and communicating  with other health care professionals, documenting clinical information in the electronic health record, and care coordination.    [name], MD Hematology and [facility] [place]: [phone]

## 2021-03-27 NOTE — Progress Notes (Signed)
Morgan Yu   [phone] [phone]   Clinic Follow up Note   Patient Care Team: Pcp, No as PCP - Sherryl Manges, MD as Consulting Physician (Hematology and Oncology) 03/27/2021  CHIEF COMPLAINT: Stage IV left breast cancer, ER+ / PR- / Her2+, grade 3  SUMMARY OF ONCOLOGIC HISTORY: Oncology History  Bone metastases ([place])  02/27/2020 Initial Diagnosis   Bone metastases ([place])   Carcinoma of breast metastatic to bone ([place])  03/19/2020 Initial Diagnosis   Carcinoma of breast metastatic to bone ([place])   03/24/2020 -  Chemotherapy   Patient is on Treatment Plan : BREAST Trastuzumab q21d     04/15/2020 Cancer Staging   Staging form: Breast, AJCC 8th Edition - Clinical stage from 04/15/2020: Stage IV (cTX, cNX, cM1, GX, ER+, PR-, HER2+) - Signed by [name], MD on 04/15/2020 Stage prefix: Initial diagnosis Histologic grading system: 3 grade system     CURRENT THERAPY:  Anastrozole 1 mg p.o. daily Herceptin/Kanjinti q3 weeks Zometa q3 months  INTERVAL HISTORY:   Morgan Yu returns for follow-up. She has been doing remarkably well. Her herceptin has been held given drop in EF. She says Dr Haroldine Laws gave her an ok to proceed with the herceptin, however the NP's note says to hold, hence I have to confirm with him She has been cooking, gardening, staying active. LUE edema has significantly improved with PT No change in breathing She has been taking her anastrazole as prescribed. No complaints, she is very happy with her QOL. Rest of the pertinent 10 point ROS reviewed and negative.   MEDICAL HISTORY:  Past Medical History:  Diagnosis Date   Breast cancer [facility])    with bone metastasis    SURGICAL HISTORY: Past Surgical History:  Procedure Laterality Date   IR PERC PLEURAL DRAIN W/INDWELL CATH W/IMG GUIDE  06/03/2020   IR RADIOLOGIST EVAL & MGMT  07/31/2020   IR REMOVAL OF PLURAL CATH W/CUFF  10/31/2020    I have reviewed  the social history and family history with the patient and they are unchanged from previous note.  ALLERGIES:  is allergic to vicodin hp [hydrocodone-acetaminophen].  MEDICATIONS:  Current Outpatient Medications  Medication Sig Dispense Refill   anastrozole (ARIMIDEX) 1 MG tablet Take 1 tablet (1 mg total) by mouth daily. 30 tablet 11   carvedilol (COREG) 6.25 MG tablet Take 1 tablet (6.25 mg total) by mouth 2 (two) times daily. 180 tablet 3   Chlorphen-Pseudoephed-APAP (TYLENOL ALLERGY SINUS PO) Take 1 tablet by mouth daily as needed (allergy).     furosemide (LASIX) 20 MG tablet Take 1 tablet (20 mg total) by mouth daily. (Patient not taking: Reported on 03/26/2021) 30 tablet 0   oxymetazoline (AFRIN) 0.05 % nasal spray Place 1 spray into both nostrils daily as needed for congestion.     sacubitril-valsartan (ENTRESTO) 49-51 MG Take 1 tablet by mouth 2 (two) times daily. 180 tablet 3   No current facility-administered medications for this visit.   Review of Systems  Constitutional:  Negative for chills, fever, malaise/fatigue and weight loss.  HENT:  Negative for sore throat.   Respiratory:  Negative for cough, shortness of breath and wheezing.   Cardiovascular:  Negative for chest pain, palpitations and leg swelling.  Gastrointestinal:  Negative for abdominal pain, blood in stool, constipation, diarrhea, melena, nausea and vomiting.  Musculoskeletal:  Negative for myalgias.  Skin:  Negative for itching and rash.    PHYSICAL EXAMINATION: ECOG PERFORMANCE STATUS: 0 -  Asymptomatic  Vitals:   03/27/21 1100  BP: (!) 162/67  Pulse: 77  Resp: 18  Temp: 97.9 F (36.6 C)  SpO2: 98%   Filed Weights   03/27/21 1100  Weight: 127 lb 1.6 oz (57.7 kg)   Physical Exam Constitutional:      Appearance: Normal appearance.  HENT:     Head: Normocephalic and atraumatic.  Cardiovascular:     Rate and Rhythm: Normal rate and regular rhythm.     Pulses: Normal pulses.     Heart sounds:  Normal heart sounds.  Pulmonary:     Effort: Pulmonary effort is normal.     Breath sounds: Normal breath sounds.  Chest:     Comments: Left breast continues to improve. Skin shows clearing of areas which were previously involved with tumor. Left axillary lymphadenopathy barely palpable now.  Abdominal:     General: Abdomen is flat. Bowel sounds are normal.  Musculoskeletal:        General: No swelling (LUE in Ace wrap, patient says swelling has decreased remarkably) or tenderness.  Skin:    General: Skin is warm and dry.  Neurological:     General: No focal deficit present.     Mental Status: She is alert.     LABORATORY DATA:  I have reviewed the data as listed CBC Latest Ref Rng & Units 02/18/2021 01/21/2021 12/31/2020  WBC 4.0 - 10.5 K/uL 7.3 5.6 6.2  Hemoglobin 12.0 - 15.0 g/dL 12.7 12.2 12.9  Hematocrit 36.0 - 46.0 % 37.7 37.4 39.0  Platelets 150 - 400 K/uL 203 200 175     CMP Latest Ref Rng & Units 03/27/2021 02/18/2021 01/21/2021  Glucose 70 - 99 mg/dL 135(H) 154(H) 186(H)  BUN 8 - 23 mg/dL _0 Creatinine 0.44 - 1.00 mg/dL 0.91 0.80 0.99  Sodium 135 - 145 mmol/L 139 140 140  Potassium 3.5 - 5.1 mmol/L 4.3 4.1 4.0  Chloride 98 - 111 mmol/L 104 104 104  CO2 22 - 32 mmol/L _1 Calcium 8.9 - 10.3 mg/dL 10.1 9.8 9.9  Total Protein 6.5 - 8.1 g/dL - - -  Total Bilirubin 0.3 - 1.2 mg/dL - - -  Alkaline Phos 38 - 126 U/L - - -  AST 15 - 41 U/L - - -  ALT 0 - 44 U/L - - -   Most recent echocardiogram in December 2022 showed left ventricular ejection fraction of 35 to 40% this is significantly different compared to the ejection fraction noticed before in September 2022.  Left ventricle demonstrates global hypokinesis.  Grade 1 diastolic dysfunction noted.  Right ventricular systolic function is normal.  No other major valvular abnormality noted. Echo in September 2022 showed ejection fraction of 60 to 65%, normal left ventricular function.    RADIOGRAPHIC  STUDIES: I have personally reviewed the radiological images as listed and agreed with the findings in the report. ECHOCARDIOGRAM COMPLETE  Result Date: 03/26/2021    ECHOCARDIOGRAM REPORT   Patient Name:   Morgan Yu Date of Exam: 03/26/2021 Medical Rec #:  [id number]      Height:       61.0 in Accession #:    [id number]     Weight:       126.4 lb Date of Birth:  [birth year]       BSA:          1.554 m Patient Age:    7 years       BP:  175/77 mmHg Patient Gender: F              HR:           80 bpm. Exam Location:  Outpatient Procedure: 2D Echo, 3D Echo, Cardiac Doppler, Color Doppler and Strain Analysis Indications:    I50.40* Unspecified combined systolic (congestive) and diastolic                 (congestive) heart failure  History:        Patient has prior history of Echocardiogram examinations, most                 recent 01/15/2021. Abnormal ECG, Arrythmias:Tachycardia,                 Signs/Symptoms:Shortness of Breath and Dyspnea; Risk                 Factors:Hypertension. Metastatic cancer. Edema.  Sonographer:    Roseanna Rainbow RDCS Referring Phys: [id number] DANIEL R BENSIMHON  Sonographer Comments: Technically difficult study due to poor echo windows. Global longitudinal strain was attempted. IMPRESSIONS  1. Left ventricular ejection fraction, by estimation, is 55 to 60%. The left ventricle has normal function. The left ventricle has no regional wall motion abnormalities. Left ventricular diastolic parameters are consistent with Grade I diastolic dysfunction (impaired relaxation). The average left ventricular global longitudinal strain is -18.1 %. The global longitudinal strain is normal.  2. Right ventricular systolic function is normal. The right ventricular size is normal.  3. Left atrial size was mildly dilated.  4. The mitral valve is normal in structure. Trivial mitral valve regurgitation. No evidence of mitral stenosis.  5. The aortic valve is tricuspid. There is mild calcification of the  aortic valve. Aortic valve regurgitation is not visualized. Aortic valve sclerosis/calcification is present, without any evidence of aortic stenosis.  6. The inferior vena cava is normal in size with greater than 50% respiratory variability, suggesting right atrial pressure of 3 mmHg. FINDINGS  Left Ventricle: Left ventricular ejection fraction, by estimation, is 55 to 60%. The left ventricle has normal function. The left ventricle has no regional wall motion abnormalities. The average left ventricular global longitudinal strain is -18.1 %. The global longitudinal strain is normal. The left ventricular internal cavity size was normal in size. There is no left ventricular hypertrophy. Left ventricular diastolic parameters are consistent with Grade I diastolic dysfunction (impaired relaxation). Right Ventricle: The right ventricular size is normal. No increase in right ventricular wall thickness. Right ventricular systolic function is normal. Left Atrium: Left atrial size was mildly dilated. Right Atrium: Right atrial size was normal in size. Pericardium: There is no evidence of pericardial effusion. Mitral Valve: The mitral valve is normal in structure. Mild mitral annular calcification. Trivial mitral valve regurgitation. No evidence of mitral valve stenosis. Tricuspid Valve: The tricuspid valve is normal in structure. Tricuspid valve regurgitation is trivial. No evidence of tricuspid stenosis. Aortic Valve: The aortic valve is tricuspid. There is mild calcification of the aortic valve. Aortic valve regurgitation is not visualized. Aortic valve sclerosis/calcification is present, without any evidence of aortic stenosis. Pulmonic Valve: The pulmonic valve was grossly normal. Pulmonic valve regurgitation is trivial. No evidence of pulmonic stenosis. Aorta: The aortic root is normal in size and structure. Venous: The inferior vena cava is normal in size with greater than 50% respiratory variability, suggesting right  atrial pressure of 3 mmHg. IAS/Shunts: No atrial level shunt detected by color flow Doppler.  LEFT VENTRICLE PLAX 2D  LVIDd:         4.00 cm     Diastology LVIDs:         2.90 cm     LV e' medial:    5.55 cm/s LV PW:         0.90 cm     LV E/e' medial:  15.0 LV IVS:        0.90 cm     LV e' lateral:   8.16 cm/s LVOT diam:     1.90 cm     LV E/e' lateral: 10.2 LV SV:         57 LV SV Index:   36          2D Longitudinal Strain LVOT Area:     2.84 cm    2D Strain GLS Avg:     -18.1 %  LV Volumes (MOD) LV vol d, MOD A2C: 66.0 ml LV vol d, MOD A4C: 54.2 ml LV vol s, MOD A2C: 34.1 ml LV vol s, MOD A4C: 26.9 ml LV SV MOD A2C:     31.9 ml LV SV MOD A4C:     54.2 ml LV SV MOD BP:      31.9 ml RIGHT VENTRICLE            IVC RV S prime:     9.77 cm/s  IVC diam: 1.60 cm TAPSE (M-mode): 1.5 cm LEFT ATRIUM             Index        RIGHT ATRIUM          Index LA diam:        3.40 cm 2.19 cm/m   RA Area:     8.30 cm LA Vol (A2C):   27.8 ml 17.89 ml/m  RA Volume:   14.80 ml 9.52 ml/m LA Vol (A4C):   19.6 ml 12.61 ml/m LA Biplane Vol: 23.9 ml 15.38 ml/m  AORTIC VALVE LVOT Vmax:   98.00 cm/s LVOT Vmean:  63.400 cm/s LVOT VTI:    0.200 m  AORTA Ao Root diam: 2.90 cm Ao Asc diam:  2.90 cm MITRAL VALVE MV Area (PHT): 3.53 cm     SHUNTS MV Decel Time: 215 msec     Systemic VTI:  0.20 m MV E velocity: 83.30 cm/s   Systemic Diam: 1.90 cm MV A velocity: 123.00 cm/s MV E/A ratio:  0.68 Glori Bickers MD Electronically signed by Glori Bickers MD Signature Date/Time: 03/26/2021/2:29:04 PM    Final      ASSESSMENT & PLAN: 81 year old female  # Metastatic breast cancer  -CT evidence of hepatic, osseous, pulmonary, and axillary nodal metastatic disease  -Biopsy 02/29/2020 left axillary/chest wall mass confirmed metastatic carcinoma consistent with breast primary, ER +50% weak, PR negative, HER2 positive 3+.  She is currently on trastuzumab every 21 days started in February 2022, anastrozole 1 mg p.o. daily and Zometa for bone  strengthening. -Received palliative radiation to the left axilla from 05/28/2020-06/25/2020 -Last echocardiogram showed an ejection fraction drop of almost 20 to 25%.  We held trastuzumab and referred her to cardiology for further recommendations. Dr. Haroldine Laws has started her on carvedilol and Entresto for cardioprotection.   He has seen her yesterday and recommended doubling the dose of carvedilol and Entresto , patient says Patient says he gave her ok to restart, but awaiting confirmation since the NP's note says to hold it. I sent him an inbasket message In the mean time, she continues to  improve on arimidex, left breast mass has improved, near resolution of left axillary LN. LUE edema has significantly improved.  NO LE swelling. She will continue anastrazole for now, and hopefully we can restart the herceptin Repeat imaging in March, ordered.  #LUE Lymphedema: with significant improvement. -- She will continue PT as recommended.  All questions were answered. The patient knows to call the clinic with any problems, questions or concerns. No barriers to learning were detected.  I have spent a total of 30 minutes minutes of face-to-face and non-face-to-face time, preparing to see the patient, performing a medically appropriate examination, counseling and educating the patient, referring and communicating with other health care professionals, documenting clinical information in the electronic health record, and care coordination.    [name], MD Hematology and [facility] at [place]: [phone]

## 2021-03-27 NOTE — Therapy (Signed)
[place] @ [address] [place], [place], [address] Phone: [phone]   Fax:  [phone]  Physical Therapy Treatment  Patient Details  Name: Morgan Yu MRN: [id number] Date of Birth: 19-May-1940 Referring Provider (PT): [place]   Encounter Date: 03/27/2021   PT End of Session - 03/27/21 0959     Visit Number 21    Number of Visits 29    Date for PT Re-Evaluation 04/15/21    PT Start Time 1001    PT Stop Time 1055    PT Time Calculation (min) 54 min    Activity Tolerance Patient tolerated treatment well    Behavior During Therapy [name] for tasks assessed/performed             Past Medical History:  Diagnosis Date   Breast cancer ([place])    with bone metastasis    Past Surgical History:  Procedure Laterality Date   IR PERC PLEURAL DRAIN W/INDWELL CATH W/IMG GUIDE  06/03/2020   IR RADIOLOGIST EVAL & MGMT  07/31/2020   IR REMOVAL OF PLURAL CATH W/CUFF  10/31/2020    There were no vitals filed for this visit.   Subjective Assessment - 03/27/21 0901     Subjective I was supposed to wear my night garment last night but I didn't.  I took a shower and then I was just too tired to put it on. I had alot of MD appts.  I have had 9 appts this week. My fingers were hurting with the wrap yesterday. My daytime sleeve has not arrived yet.    Pertinent History breast cancer with mets, hepatic, osseous, pulmonary, and axillary nodal metastasis, axillary chest wall mets, biopsy 02/29/20, ER+ PR- HER2+, pt was receiving radiation in April 2022 and had to stop due to anxiety from fluid build up, pt had a drain placed to drain fluid from abdominal cavity from low protein, pt taking herceptin, drain has been out since June 26th, pt has had lymphedema in LUE since Jan 2021 when she was diagnosed    Patient Stated Goals to get arm swelling down    Currently in Pain? No/denies    Pain Score 0-No pain                                [facility] Adult PT Treatment/Exercise - 03/27/21 0001       Manual Therapy   Edema Management educated pt in proper way to wash wraps as she has not washed either set yet.    Manual Lymphatic Drainage (MLD) in supine : short neck, 5 diaphragmatic breaths, right axillary nodes and establishment of interaxillary pathway, left inguinal nodes and establishment of axillo inguinal pathway, LUE working proximal to distal then retracing all steps    Compression Bandaging lotion then thick stockinette from hand to axilla, artiflex from wrist to axilla  then 1 6 cm bandage to hand and halfway up forearm (forearm done in herring bone fashion), 1 8 cm wrist to axilla in herringbone, 1 10cm from wrist to axilla,                          PT Long Term Goals - 03/18/21 1307       PT LONG TERM GOAL #1   Title Pt will demonstrate a 5 cm decrease in edema 15 cm proximal to ulnar styloid process to decrease  risk of infection.    Baseline 29 cm; 03/02/21- 25.5, 03/09/2021: 23    Time 6    Period Weeks    Status Achieved      PT LONG TERM GOAL #2   Title Pt will receive trial of FLexiTouch compression pump for long term management of lymphedema    Baseline 03/02/21- demographics sent; 03/18/21- pt has recieved her FlexiTouch compression pump    Time 6    Period Weeks    Status Achieved      PT LONG TERM GOAL #3   Title Pt will be independent in self MLD for long term management of lymphedema.    Time 6    Period Weeks    Status On-going      PT LONG TERM GOAL #4   Title Pt will receive an appropriate compression garment for long term management of lymphedema.    Baseline 03/02/21- pt is still demonstrating reduction; 03/18/21-pt is awaiting arrival of her compression garments    Time 6    Period Weeks    Status On-going      PT LONG TERM GOAL #5   Title Pt will demonstrate 120 degrees of L shoulder abduction to allow pt to reach out to the side.     Baseline 64; 03/18/21- 105    Time 6    Period Weeks    Status On-going      PT LONG TERM GOAL #6   Title Pt will be independent in a home exercise program for continued stretching and strengthening.    Time 6    Period Weeks    Status On-going                   Plan - 03/27/21 1000     Clinical Impression Statement Pt did not try applying her circaid last night because she was too tired so came in unwrapped today. Continued MLD and compression bandages and discussed proper way to launder bandages as they are not very stretchy.  Advised pt again that if she needs to take off wraps to use her pump, wear her old daytime sleeve, and to try the circaid night.  She understands.  Her arm continues to look very good.    Examination-Activity Limitations Caring for Others;Reach Overhead;Lift    Stability/Clinical Decision Making Stable/Uncomplicated    Rehab Potential Good    PT Frequency 3x / week    PT Duration 4 weeks    PT Treatment/Interventions ADLs/Self Care Home Management;Patient/family education;Therapeutic exercise;Manual techniques;Manual lymph drainage;Compression bandaging;Orthotic Fit/Training;Passive range of motion;Vasopneumatic Device    PT Next Visit Plan how was pump and night time garment last night? cont complete CDT, pt measured for garments 03/11/21 , gentle PROM and AAROM exercises for L shoulder    Consulted and Agree with Plan of Care Patient             Patient will benefit from skilled therapeutic intervention in order to improve the following deficits and impairments:  Postural dysfunction, Impaired UE functional use, Increased fascial restricitons, Decreased strength, Decreased range of motion, Increased edema  Visit Diagnosis: Lymphedema, not elsewhere classified  Stiffness of left shoulder, not elsewhere classified  Malignant neoplasm of left breast in female, estrogen receptor positive, unspecified site of breast [facility])     Problem  List Patient Active Problem List   Diagnosis Date Noted   SOB (shortness of breath) 01/22/2021   HTN (hypertension) 11/11/2020   Hospice care patient 06/05/2020   Acute  hypoxemic respiratory failure ([place]) 05/28/2020   Lymphedema 05/27/2020   Bilateral lower extremity edema 05/27/2020   Malignant pleural effusion 05/27/2020   Anxiety state 05/21/2020   Carcinoma of breast metastatic to bone ([place]) 03/19/2020   Protein-calorie malnutrition, severe 03/04/2020   Pressure injury of skin 02/28/2020   Bone metastases ([place]) 02/27/2020   Elevated LFTs 02/27/2020   Elevated troponin 02/27/2020   Pleural effusion 02/27/2020   Breast mass, left 02/27/2020   Sinus tachycardia 02/27/2020    Claris Pong, PT 03/27/2021, 10:03 AM  [place] @ [address] [place], [place], [address] Phone: [phone]   Fax:  [phone]  Name: Morgan Yu MRN: [id number] Date of Birth: [birth year]

## 2021-03-30 ENCOUNTER — Encounter: Payer: Self-pay | Admitting: Physical Therapy

## 2021-03-30 ENCOUNTER — Other Ambulatory Visit: Payer: Self-pay | Admitting: Hematology and Oncology

## 2021-03-30 ENCOUNTER — Ambulatory Visit: Payer: Medicare HMO | Admitting: Physical Therapy

## 2021-03-30 ENCOUNTER — Telehealth: Payer: Self-pay | Admitting: Hematology and Oncology

## 2021-03-30 ENCOUNTER — Other Ambulatory Visit: Payer: Self-pay

## 2021-03-30 DIAGNOSIS — C50912 Malignant neoplasm of unspecified site of left female breast: Secondary | ICD-10-CM | POA: Diagnosis not present

## 2021-03-30 DIAGNOSIS — I890 Lymphedema, not elsewhere classified: Secondary | ICD-10-CM

## 2021-03-30 DIAGNOSIS — M25612 Stiffness of left shoulder, not elsewhere classified: Secondary | ICD-10-CM | POA: Diagnosis not present

## 2021-03-30 DIAGNOSIS — Z170 Estrogen receptor positive status [ER+]: Secondary | ICD-10-CM | POA: Diagnosis not present

## 2021-03-30 NOTE — Telephone Encounter (Signed)
Scheduled appointment per 02/24 los. Left message with appointment per times.

## 2021-03-30 NOTE — Therapy (Signed)
[facility] @ [address] [place], [place], [address] Phone: [phone]   Fax:  [phone]  Physical Therapy Treatment  Patient Details  Name: Morgan Yu MRN: [id number] Date of Birth: 10/19/1940 Referring Provider (PT): Iruku   Encounter Date: 03/30/2021   PT End of Session - 03/30/21 1304     Visit Number 22   kx modifier   Number of Visits 29    Date for PT Re-Evaluation 04/15/21    PT Start Time 2633    PT Stop Time 1350    PT Time Calculation (min) 47 min    Activity Tolerance Patient tolerated treatment well    Behavior During Therapy [facility] for tasks assessed/performed             Past Medical History:  Diagnosis Date   Breast cancer ([place])    with bone metastasis    Past Surgical History:  Procedure Laterality Date   IR PERC PLEURAL DRAIN W/INDWELL CATH W/IMG GUIDE  06/03/2020   IR RADIOLOGIST EVAL & MGMT  07/31/2020   IR REMOVAL OF PLURAL CATH W/CUFF  10/31/2020    There were no vitals filed for this visit.   Subjective Assessment - 03/30/21 1304     Subjective I put the sleeve on this morning. I tried to wear my night time garment but I had trouble getting it on all the way and I had to take it off halfway through the night. My sleeve is in and I will pick it up Saturday before my appointment.    Pertinent History breast cancer with mets, hepatic, osseous, pulmonary, and axillary nodal metastasis, axillary chest wall mets, biopsy 02/29/20, ER+ PR- HER2+, pt was receiving radiation in April 2022 and had to stop due to anxiety from fluid build up, pt had a drain placed to drain fluid from abdominal cavity from low protein, pt taking herceptin, drain has been out since June 26th, pt has had lymphedema in LUE since Jan 2021 when she was diagnosed    Patient Stated Goals to get arm swelling down    Currently in Pain? No/denies    Pain Score 0-No pain                               [facility]  Adult PT Treatment/Exercise - 03/30/21 0001       Manual Therapy   Manual Lymphatic Drainage (MLD) in supine : short neck, 5 diaphragmatic breaths, right axillary nodes and establishment of interaxillary pathway, left inguinal nodes and establishment of axillo inguinal pathway, LUE working proximal to distal then retracing all steps    Compression Bandaging lotion then thick stockinette from hand to axilla, artiflex from wrist to axilla  then 1 6 cm bandage to hand and halfway up forearm (forearm done in herring bone fashion), 1 8 cm wrist to axilla in herringbone, 1 10cm from wrist to axilla,                          PT Long Term Goals - 03/18/21 1307       PT LONG TERM GOAL #1   Title Pt will demonstrate a 5 cm decrease in edema 15 cm proximal to ulnar styloid process to decrease risk of infection.    Baseline 29 cm; 03/02/21- 25.5, 03/09/2021: 23    Time 6    Period Weeks  Status Achieved      PT LONG TERM GOAL #2   Title Pt will receive trial of FLexiTouch compression pump for long term management of lymphedema    Baseline 03/02/21- demographics sent; 03/18/21- pt has recieved her FlexiTouch compression pump    Time 6    Period Weeks    Status Achieved      PT LONG TERM GOAL #3   Title Pt will be independent in self MLD for long term management of lymphedema.    Time 6    Period Weeks    Status On-going      PT LONG TERM GOAL #4   Title Pt will receive an appropriate compression garment for long term management of lymphedema.    Baseline 03/02/21- pt is still demonstrating reduction; 03/18/21-pt is awaiting arrival of her compression garments    Time 6    Period Weeks    Status On-going      PT LONG TERM GOAL #5   Title Pt will demonstrate 120 degrees of L shoulder abduction to allow pt to reach out to the side.    Baseline 64; 03/18/21- 105    Time 6    Period Weeks    Status On-going      PT LONG TERM GOAL #6   Title Pt will be independent in a home  exercise program for continued stretching and strengthening.    Time 6    Period Weeks    Status On-going                   Plan - 03/30/21 1351     Clinical Impression Statement Pt returns to PT. She reports her garment is now in and she will pick it up prior to her appointment on Wednesday. Pt has been wearing her old sleeve for today. She slept in her night time garment last time. She had difficulty donning it completely due to dififculty getting the hand all the way in the hand piece. She reports she had to take it off halfway through the night. Encouraged pt to bring it to next session to troubleshoot donning technique.    PT Frequency 3x / week    PT Duration 4 weeks    PT Treatment/Interventions ADLs/Self Care Home Management;Patient/family education;Therapeutic exercise;Manual techniques;Manual lymph drainage;Compression bandaging;Orthotic Fit/Training;Passive range of motion;Vasopneumatic Device    PT Next Visit Plan how was pump and night time garment last night? cont complete CDT, pt measured for garments 03/11/21 , gentle PROM and AAROM exercises for L shoulder    Consulted and Agree with Plan of Care Patient             Patient will benefit from skilled therapeutic intervention in order to improve the following deficits and impairments:  Postural dysfunction, Impaired UE functional use, Increased fascial restricitons, Decreased strength, Decreased range of motion, Increased edema  Visit Diagnosis: Lymphedema, not elsewhere classified  Stiffness of left shoulder, not elsewhere classified  Malignant neoplasm of left breast in female, estrogen receptor positive, unspecified site of breast [facility])     Problem List Patient Active Problem List   Diagnosis Date Noted   SOB (shortness of breath) 01/22/2021   HTN (hypertension) 11/11/2020   Hospice care patient 06/05/2020   Acute hypoxemic respiratory failure ([place]) 05/28/2020   Lymphedema 05/27/2020   Bilateral  lower extremity edema 05/27/2020   Malignant pleural effusion 05/27/2020   Anxiety state 05/21/2020   Carcinoma of breast metastatic to bone ([place]) 03/19/2020  Protein-calorie malnutrition, severe 03/04/2020   Pressure injury of skin 02/28/2020   Bone metastases ([place]) 02/27/2020   Elevated LFTs 02/27/2020   Elevated troponin 02/27/2020   Pleural effusion 02/27/2020   Breast mass, left 02/27/2020   Sinus tachycardia 02/27/2020    [name] [place], PT 03/30/2021, 1:53 PM  [facility] @ [place], [place], [address] Phone: [phone]   Fax:  [phone]  Name: Morgan Yu MRN: [id number] Date of Birth: August 03, 1940

## 2021-04-01 ENCOUNTER — Ambulatory Visit: Payer: Medicare HMO | Attending: Hematology and Oncology | Admitting: Physical Therapy

## 2021-04-01 ENCOUNTER — Other Ambulatory Visit: Payer: Self-pay

## 2021-04-01 ENCOUNTER — Encounter: Payer: Self-pay | Admitting: Physical Therapy

## 2021-04-01 DIAGNOSIS — M25612 Stiffness of left shoulder, not elsewhere classified: Secondary | ICD-10-CM | POA: Diagnosis not present

## 2021-04-01 DIAGNOSIS — C50912 Malignant neoplasm of unspecified site of left female breast: Secondary | ICD-10-CM | POA: Insufficient documentation

## 2021-04-01 DIAGNOSIS — Z170 Estrogen receptor positive status [ER+]: Secondary | ICD-10-CM | POA: Diagnosis not present

## 2021-04-01 DIAGNOSIS — I890 Lymphedema, not elsewhere classified: Secondary | ICD-10-CM | POA: Diagnosis not present

## 2021-04-01 NOTE — Therapy (Signed)
[facility] ?[facility] @ [place] ?[place], [place], [address] ?Phone: [phone]   Fax:  [phone] ? ?Physical Therapy Treatment ? ?Patient Details  ?Name: Morgan Yu ?MRN: [id number] ?Date of Birth: April 14, 1940 ?Referring Provider (PT): Iruku ? ? ?Encounter Date: 04/01/2021 ? ? PT End of Session - 04/01/21 1400   ? ? Visit Number 23   kx modifier  ? Number of Visits 29   ? Date for PT Re-Evaluation 04/15/21   ? PT Start Time 1302   ? PT Stop Time 1355   ? PT Time Calculation (min) 53 min   ? Activity Tolerance Patient tolerated treatment well   ? Behavior During Therapy [facility] for tasks assessed/performed   ? ?  ?  ? ?  ? ? ?Past Medical History:  ?Diagnosis Date  ? Breast cancer ([place])   ? with bone metastasis  ? ? ?Past Surgical History:  ?Procedure Laterality Date  ? IR PERC PLEURAL DRAIN W/INDWELL CATH W/IMG GUIDE  06/03/2020  ? IR RADIOLOGIST EVAL & MGMT  07/31/2020  ? IR REMOVAL OF PLURAL CATH W/CUFF  10/31/2020  ? ? ?There were no vitals filed for this visit. ? ? Subjective Assessment - 04/01/21 1403   ? ? Subjective I got my compression sleeve today. I have not tried it on yet.   ? Pertinent History breast cancer with mets, hepatic, osseous, pulmonary, and axillary nodal metastasis, axillary chest wall mets, biopsy 02/29/20, ER+ PR- HER2+, pt was receiving radiation in April 2022 and had to stop due to anxiety from fluid build up, pt had a drain placed to drain fluid from abdominal cavity from low protein, pt taking herceptin, drain has been out since June 26th, pt has had lymphedema in LUE since Jan 2021 when she was diagnosed   ? Patient Stated Goals to get arm swelling down   ? Currently in Pain? No/denies   ? Pain Score 0-No pain   ? ?  ?  ? ?  ? ? ? ? ? ? ? ? ? ? ? ? ? ? ? ? ? ? ? ? [place] Adult PT Treatment/Exercise - 04/01/21 0001   ? ?  ? Manual Therapy  ? Edema Management assessed pt's custom sleeve and glove for fit - she recieved a guantlet instead of a  glove and her sleeve was too loose at wrist and forearm   ? Manual Lymphatic Drainage (MLD) in supine : short neck, 5 diaphragmatic breaths, right axillary nodes and establishment of interaxillary pathway, left inguinal nodes and establishment of axillo inguinal pathway, LUE working proximal to distal then retracing all steps   ? Compression Bandaging applied custom compression sleeve and glove at end of session despite it not fitting well because pt is going to get remeasured for a sleeve after this appointment and will need to remove bandages   ? ?  ?  ? ?  ? ? ? ? ? ? ? ? ? ? PT Education - 04/01/21 1401   ? ? Education Details proper donning/doffing of compression sleeve and gauntlet   ? Person(s) Educated Patient   ? Methods Explanation;Demonstration   ? Comprehension Verbalized understanding;Returned demonstration   ? ?  ?  ? ?  ? ? ? ? ? ? PT Long Term Goals - 03/18/21 1307   ? ?  ? PT LONG TERM GOAL #1  ? Title Pt will demonstrate a 5 cm decrease in edema 15 cm proximal to  ulnar styloid process to decrease risk of infection.   ? Baseline 29 cm; 03/02/21- 25.5, 03/09/2021: 23   ? Time 6   ? Period Weeks   ? Status Achieved   ?  ? PT LONG TERM GOAL #2  ? Title Pt will receive trial of FLexiTouch compression pump for long term management of lymphedema   ? Baseline 03/02/21- demographics sent; 03/18/21- pt has recieved her FlexiTouch compression pump   ? Time 6   ? Period Weeks   ? Status Achieved   ?  ? PT LONG TERM GOAL #3  ? Title Pt will be independent in self MLD for long term management of lymphedema.   ? Time 6   ? Period Weeks   ? Status On-going   ?  ? PT LONG TERM GOAL #4  ? Title Pt will receive an appropriate compression garment for long term management of lymphedema.   ? Baseline 03/02/21- pt is still demonstrating reduction; 03/18/21-pt is awaiting arrival of her compression garments   ? Time 6   ? Period Weeks   ? Status On-going   ?  ? PT LONG TERM GOAL #5  ? Title Pt will demonstrate 120 degrees of L  shoulder abduction to allow pt to reach out to the side.   ? Baseline 64; 03/18/21- 105   ? Time 6   ? Period Weeks   ? Status On-going   ?  ? PT LONG TERM GOAL #6  ? Title Pt will be independent in a home exercise program for continued stretching and strengthening.   ? Time 6   ? Period Weeks   ? Status On-going   ? ?  ?  ? ?  ? ? ? ? ? ? ? ? Plan - 04/01/21 1403   ? ? Clinical Impression Statement Pt received her day time compression sleeve that was custom and a custom compression gauntlet (supposed to be a glove). Educated pt in proper donning/doffing technique. Sleeve did not fit well and was too loose from wrist to elbow. It was also too short. Pt received a gauntlet instead of a glove which she needed a glove. Contacted DME supplier and pt is going by to be remeasured this afternoon. Reapplied compression garments since she would have to immediately remove bandages. Briefly performed MLD to pt's LUE. Will instruct pt more in donning/doffing night time garment at next appointment. Attempted to measure pt for an off the shelf sleeve and glove but her hand and UE were too small.   ? PT Frequency 3x / week   ? PT Duration 4 weeks   ? PT Treatment/Interventions ADLs/Self Care Home Management;Patient/family education;Therapeutic exercise;Manual techniques;Manual lymph drainage;Compression bandaging;Orthotic Fit/Training;Passive range of motion;Vasopneumatic Device   ? PT Next Visit Plan instruct in proper donning/doffing of night time garment, did pt get reameasured? how was pump and night time garment last night? cont complete CDT, pt measured for garments 03/11/21 , gentle PROM and AAROM exercises for L shoulder   ? Consulted and Agree with Plan of Care Patient   ? ?  ?  ? ?  ? ? ?Patient will benefit from skilled therapeutic intervention in order to improve the following deficits and impairments:  Postural dysfunction, Impaired UE functional use, Increased fascial restricitons, Decreased strength, Decreased range of  motion, Increased edema ? ?Visit Diagnosis: ?Lymphedema, not elsewhere classified ? ?Stiffness of left shoulder, not elsewhere classified ? ?Malignant neoplasm of left breast in female, estrogen receptor positive, unspecified site  of breast ([place]) ? ? ? ? ?Problem List ?Patient Active Problem List  ? Diagnosis Date Noted  ? SOB (shortness of breath) 01/22/2021  ? HTN (hypertension) 11/11/2020  ? Hospice care patient 06/05/2020  ? Acute hypoxemic respiratory failure ([place]) 05/28/2020  ? Lymphedema 05/27/2020  ? Bilateral lower extremity edema 05/27/2020  ? Malignant pleural effusion 05/27/2020  ? Anxiety state 05/21/2020  ? Carcinoma of breast metastatic to bone ([place]) 03/19/2020  ? Protein-calorie malnutrition, severe 03/04/2020  ? Pressure injury of skin 02/28/2020  ? Bone metastases ([place]) 02/27/2020  ? Elevated LFTs 02/27/2020  ? Elevated troponin 02/27/2020  ? Pleural effusion 02/27/2020  ? Breast mass, left 02/27/2020  ? Sinus tachycardia 02/27/2020  ? ? ?[name] [place], PT ?04/01/2021, 2:06 PM ? ?[facility] ?[facility] @ [place] ?[place], [place], [address] ?Phone: [phone]   Fax:  [phone] ? ?Name: Morgan Yu ?MRN: [id number] ?Date of Birth: May 21, 1940 ? ? ? ?

## 2021-04-03 ENCOUNTER — Other Ambulatory Visit: Payer: Self-pay

## 2021-04-03 ENCOUNTER — Ambulatory Visit: Payer: Medicare HMO

## 2021-04-03 DIAGNOSIS — I890 Lymphedema, not elsewhere classified: Secondary | ICD-10-CM

## 2021-04-03 DIAGNOSIS — M25612 Stiffness of left shoulder, not elsewhere classified: Secondary | ICD-10-CM | POA: Diagnosis not present

## 2021-04-03 DIAGNOSIS — C50912 Malignant neoplasm of unspecified site of left female breast: Secondary | ICD-10-CM

## 2021-04-03 DIAGNOSIS — Z170 Estrogen receptor positive status [ER+]: Secondary | ICD-10-CM | POA: Diagnosis not present

## 2021-04-03 NOTE — Therapy (Signed)
[facility] ?[facility] @ [place] ?[place], [place], [address] ?Phone: [phone]   Fax:  [phone] ? ?Physical Therapy Treatment ? ?Patient Details  ?Name: Morgan Yu ?MRN: [id number] ?Date of Birth: [birth year] ?Referring Provider (PT): Iruku ? ? ?Encounter Date: 04/03/2021 ? ? PT End of Session - 04/03/21 1208   ? ? Visit Number 23   ? Number of Visits 29   ? Date for PT Re-Evaluation 04/15/21   ? PT Start Time 1105   ? PT Stop Time 1203   ? PT Time Calculation (min) 58 min   ? Activity Tolerance Patient tolerated treatment well   ? Behavior During Therapy [facility] for tasks assessed/performed   ? ?  ?  ? ?  ? ? ?Past Medical History:  ?Diagnosis Date  ? Breast cancer ([place])   ? with bone metastasis  ? ? ?Past Surgical History:  ?Procedure Laterality Date  ? IR PERC PLEURAL DRAIN W/INDWELL CATH W/IMG GUIDE  06/03/2020  ? IR RADIOLOGIST EVAL & MGMT  07/31/2020  ? IR REMOVAL OF PLURAL CATH W/CUFF  10/31/2020  ? ? ?There were no vitals filed for this visit. ? ? Subjective Assessment - 04/03/21 1105   ? ? Subjective I wore the compression sleeve yesterday and last night to bed. I got measured again and they did it over my sleeve. They said it would take about a week to come in.   ? Pertinent History breast cancer with mets, hepatic, osseous, pulmonary, and axillary nodal metastasis, axillary chest wall mets, biopsy 02/29/20, ER+ PR- HER2+, pt was receiving radiation in April 2022 and had to stop due to anxiety from fluid build up, pt had a drain placed to drain fluid from abdominal cavity from low protein, pt taking herceptin, drain has been out since June 26th, pt has had lymphedema in LUE since Jan 2021 when she was diagnosed   ? Currently in Pain? No/denies   ? Pain Score 0-No pain   ? ?  ?  ? ?  ? ? ? ? ? ? ? ? ? ? ? ? ? ? ? ? ? ? ? ? [place] Adult PT Treatment/Exercise - 04/03/21 0001   ? ?  ? Manual Therapy  ? Edema Management assessed pts circaid and helped her don. removed  outer sleeve to don, pt used pull tabs with occasional help of PT. hand portion was slightly tight but after manipulating it a bit made it feel better. Advised pt not to pull garments too tight into web space. Slightly difficult for her to don but I believe it will stretch out   ? Manual Lymphatic Drainage (MLD) in supine : short neck, 5 diaphragmatic breaths, right axillary nodes and establishment of interaxillary pathway, left inguinal nodes and establishment of axillo inguinal pathway, LUE working proximal to distal then retracing all steps   ? Compression Bandaging lotion then thick stockinette from hand to axilla, artiflex from wrist to axilla  then 1 6 cm bandage to hand and halfway up forearm (forearm done in herring bone fashion), 1 8 cm wrist to axilla in herringbone, 1 10cm from wrist to axilla,   ? ?  ?  ? ?  ? ? ? ? ? ? ? ? ? ? ? ? ? ? ? PT Long Term Goals - 03/18/21 1307   ? ?  ? PT LONG TERM GOAL #1  ? Title Pt will demonstrate a 5 cm decrease in edema 15  cm proximal to ulnar styloid process to decrease risk of infection.   ? Baseline 29 cm; 03/02/21- 25.5, 03/09/2021: 23   ? Time 6   ? Period Weeks   ? Status Achieved   ?  ? PT LONG TERM GOAL #2  ? Title Pt will receive trial of FLexiTouch compression pump for long term management of lymphedema   ? Baseline 03/02/21- demographics sent; 03/18/21- pt has recieved her FlexiTouch compression pump   ? Time 6   ? Period Weeks   ? Status Achieved   ?  ? PT LONG TERM GOAL #3  ? Title Pt will be independent in self MLD for long term management of lymphedema.   ? Time 6   ? Period Weeks   ? Status On-going   ?  ? PT LONG TERM GOAL #4  ? Title Pt will receive an appropriate compression garment for long term management of lymphedema.   ? Baseline 03/02/21- pt is still demonstrating reduction; 03/18/21-pt is awaiting arrival of her compression garments   ? Time 6   ? Period Weeks   ? Status On-going   ?  ? PT LONG TERM GOAL #5  ? Title Pt will demonstrate 120 degrees of  L shoulder abduction to allow pt to reach out to the side.   ? Baseline 64; 03/18/21- 105   ? Time 6   ? Period Weeks   ? Status On-going   ?  ? PT LONG TERM GOAL #6  ? Title Pt will be independent in a home exercise program for continued stretching and strengthening.   ? Time 6   ? Period Weeks   ? Status On-going   ? ?  ?  ? ?  ? ? ? ? ? ? ? ? Plan - 04/03/21 1213   ? ? Clinical Impression Statement Pts Circaid a bit snug and a little difficult for her to don but should be fine when it loosens up a bit.  Hand part may require a little stretching if it is uncomfortable, but when pt donned at home her fingers were not out at all per her report.  Her arm had some fairly deep red marks from wearing her compression sleeve day and night  and her skin was very dry. Removed garments and had pt wash arm and applied lotion. Continued MLD and compression bandaging.  Asked pt to remove ring from her left hand in case her fingers were to swell. Advised that if wrap is too tight may remove the top wrap or if it had to come off she could do sleeve in the day and circaid at night. Continue CDP until pt receives new garments.   ? Examination-Activity Limitations Caring for Others;Reach Overhead;Lift   ? Rehab Potential Good   ? PT Frequency 3x / week   ? PT Treatment/Interventions ADLs/Self Care Home Management;Patient/family education;Therapeutic exercise;Manual techniques;Manual lymph drainage;Compression bandaging;Orthotic Fit/Training;Passive range of motion;Vasopneumatic Device   ? PT Next Visit Plan instruct in proper donning/doffing of night time garment, did pt get reameasured? how was pump and night time garment last night? cont complete CDT, pt measured for garments 03/11/21 , gentle PROM and AAROM exercises for L shoulder   ? Consulted and Agree with Plan of Care Patient   ? ?  ?  ? ?  ? ? ?Patient will benefit from skilled therapeutic intervention in order to improve the following deficits and impairments:  Postural  dysfunction, Impaired UE functional use, Increased fascial restricitons,  Decreased strength, Decreased range of motion, Increased edema ? ?Visit Diagnosis: ?Lymphedema, not elsewhere classified ? ?Stiffness of left shoulder, not elsewhere classified ? ?Malignant neoplasm of left breast in female, estrogen receptor positive, unspecified site of breast ([place]) ? ? ? ? ?Problem List ?Patient Active Problem List  ? Diagnosis Date Noted  ? SOB (shortness of breath) 01/22/2021  ? HTN (hypertension) 11/11/2020  ? Hospice care patient 06/05/2020  ? Acute hypoxemic respiratory failure ([place]) 05/28/2020  ? Lymphedema 05/27/2020  ? Bilateral lower extremity edema 05/27/2020  ? Malignant pleural effusion 05/27/2020  ? Anxiety state 05/21/2020  ? Carcinoma of breast metastatic to bone ([place]) 03/19/2020  ? Protein-calorie malnutrition, severe 03/04/2020  ? Pressure injury of skin 02/28/2020  ? Bone metastases ([place]) 02/27/2020  ? Elevated LFTs 02/27/2020  ? Elevated troponin 02/27/2020  ? Pleural effusion 02/27/2020  ? Breast mass, left 02/27/2020  ? Sinus tachycardia 02/27/2020  ? ? ?Claris Pong, PT ?04/03/2021, 12:20 PM ? ?[facility] ?[facility] @ [place] ?[place], [place], [address] ?Phone: [phone]   Fax:  [phone] ? ?Name: SOUNDRA LAMPLEY ?MRN: [id number] ?Date of Birth: 1940/11/24 ? ? ? ?

## 2021-04-07 ENCOUNTER — Other Ambulatory Visit: Payer: Self-pay

## 2021-04-07 ENCOUNTER — Ambulatory Visit: Payer: Medicare HMO

## 2021-04-07 DIAGNOSIS — I890 Lymphedema, not elsewhere classified: Secondary | ICD-10-CM | POA: Diagnosis not present

## 2021-04-07 DIAGNOSIS — C50912 Malignant neoplasm of unspecified site of left female breast: Secondary | ICD-10-CM

## 2021-04-07 DIAGNOSIS — Z170 Estrogen receptor positive status [ER+]: Secondary | ICD-10-CM | POA: Diagnosis not present

## 2021-04-07 DIAGNOSIS — M25612 Stiffness of left shoulder, not elsewhere classified: Secondary | ICD-10-CM

## 2021-04-07 NOTE — Therapy (Signed)
[place] ?[facility] [place] ?[place], [place], [address] ?Phone: [phone]   Fax:  [phone] ? ?Physical Therapy Treatment ? ?Patient Details  ?Name: Morgan Yu ?MRN: [id number] ?Date of Birth: [birth year] ?Referring Provider (PT): Iruku ? ? ?Encounter Date: 04/07/2021 ? ? PT End of Session - 04/07/21 0953   ? ? Visit Number 24   ? Number of Visits 29   ? Date for PT Re-Evaluation 04/15/21   ? PT Start Time [phone]   ? PT Stop Time [phone]   ? PT Time Calculation (min) 48 min   ? Activity Tolerance Patient tolerated treatment well   ? Behavior During Therapy [facility] for tasks assessed/performed   ? ?  ?  ? ?  ? ? ?Past Medical History:  ?Diagnosis Date  ? Breast cancer ([place])   ? with bone metastasis  ? ? ?Past Surgical History:  ?Procedure Laterality Date  ? IR PERC PLEURAL DRAIN W/INDWELL CATH W/IMG GUIDE  06/03/2020  ? IR RADIOLOGIST EVAL & MGMT  07/31/2020  ? IR REMOVAL OF PLURAL CATH W/CUFF  10/31/2020  ? ? ?There were no vitals filed for this visit. ? ? Subjective Assessment - 04/07/21 0903   ? ? Subjective The wrap did well and I was able to sleep in it OK. I just put my cast cover on it to stay dry when I shower   ? Pertinent History breast cancer with mets, hepatic, osseous, pulmonary, and axillary nodal metastasis, axillary chest wall mets, biopsy 02/29/20, ER+ PR- HER2+, pt was receiving radiation in April 2022 and had to stop due to anxiety from fluid build up, pt had a drain placed to drain fluid from abdominal cavity from low protein, pt taking herceptin, drain has been out since June 26th, pt has had lymphedema in LUE since Jan 2021 when she was diagnosed   ? Patient Stated Goals to get arm swelling down   ? Currently in Pain? No/denies   ? Multiple Pain Sites No   ? ?  ?  ? ?  ? ? ? ? ? ? ? ? LYMPHEDEMA/ONCOLOGY QUESTIONNAIRE - 04/07/21 0001   ? ?  ? Left Upper Extremity Lymphedema  ? 10 cm Proximal to Olecranon Process 26 cm   ? Olecranon Process 23 cm   ? 15  cm Proximal to Ulnar Styloid Process 23 cm   ? 10 cm Proximal to Ulnar Styloid Process 20.4 cm   ? Just Proximal to Ulnar Styloid Process 15.8 cm   ? At [facility] of 2nd Digit 5.5 cm   ? ?  ?  ? ?  ? ? ? ? ? ? ? ? ? ? ? ? ? [place] Adult PT Treatment/Exercise - 04/07/21 0001   ? ?  ? Manual Therapy  ? Edema Management pt remeasured   ? Manual Lymphatic Drainage (MLD) in supine : short neck, 5 diaphragmatic breaths, right axillary nodes and establishment of interaxillary pathway, left inguinal nodes and establishment of axillo inguinal pathway, LUE working proximal to distal then retracing all steps   ? Compression Bandaging lotion then thick stockinette from hand to axilla, artiflex from wrist to axilla  then 1 6 cm bandage to hand and halfway up forearm (forearm done in herring bone fashion), 1 8 cm wrist to axilla in herringbone, 1 10cm from wrist to axilla,   ? ?  ?  ? ?  ? ? ? ? ? ? ? ? ? ? ? ? ? ? ?  PT Long Term Goals - 03/18/21 1307   ? ?  ? PT LONG TERM GOAL #1  ? Title Pt will demonstrate a 5 cm decrease in edema 15 cm proximal to ulnar styloid process to decrease risk of infection.   ? Baseline 29 cm; 03/02/21- 25.5, 03/09/2021: 23   ? Time 6   ? Period Weeks   ? Status Achieved   ?  ? PT LONG TERM GOAL #2  ? Title Pt will receive trial of FLexiTouch compression pump for long term management of lymphedema   ? Baseline 03/02/21- demographics sent; 03/18/21- pt has recieved her FlexiTouch compression pump   ? Time 6   ? Period Weeks   ? Status Achieved   ?  ? PT LONG TERM GOAL #3  ? Title Pt will be independent in self MLD for long term management of lymphedema.   ? Time 6   ? Period Weeks   ? Status On-going   ?  ? PT LONG TERM GOAL #4  ? Title Pt will receive an appropriate compression garment for long term management of lymphedema.   ? Baseline 03/02/21- pt is still demonstrating reduction; 03/18/21-pt is awaiting arrival of her compression garments   ? Time 6   ? Period Weeks   ? Status On-going   ?  ? PT LONG TERM GOAL  #5  ? Title Pt will demonstrate 120 degrees of L shoulder abduction to allow pt to reach out to the side.   ? Baseline 64; 03/18/21- 105   ? Time 6   ? Period Weeks   ? Status On-going   ?  ? PT LONG TERM GOAL #6  ? Title Pt will be independent in a home exercise program for continued stretching and strengthening.   ? Time 6   ? Period Weeks   ? Status On-going   ? ?  ?  ? ?  ? ? ? ? ? ? ? ? Plan - 04/07/21 0954   ? ? Clinical Impression Statement Pts wrap removed and arm remeasured.  Slight increases in several places in forearm and bogginess noted at posterior elbow and inferior with pitting edema. (Wrap had been on 4 days) Continued MLD with emphasis on area of bogginess which was improved after MLD, and pt was wrapped from the hand to the elbow.  Pt removed her ring from the left hand in case fingers were to swell.   ? Examination-Activity Limitations Caring for Others;Reach Overhead;Lift   ? Stability/Clinical Decision Making Stable/Uncomplicated   ? Rehab Potential Good   ? PT Frequency 3x / week   ? PT Duration 4 weeks   ? PT Treatment/Interventions ADLs/Self Care Home Management;Patient/family education;Therapeutic exercise;Manual techniques;Manual lymph drainage;Compression bandaging;Orthotic Fit/Training;Passive range of motion;Vasopneumatic Device   ? PT Next Visit Plan continue CDT until pt gets new sleeve, PROM, AAROM left shoulder   ? Consulted and Agree with Plan of Care Patient   ? ?  ?  ? ?  ? ? ?Patient will benefit from skilled therapeutic intervention in order to improve the following deficits and impairments:  Postural dysfunction, Impaired UE functional use, Increased fascial restricitons, Decreased strength, Decreased range of motion, Increased edema ? ?Visit Diagnosis: ?Lymphedema, not elsewhere classified ? ?Stiffness of left shoulder, not elsewhere classified ? ?Malignant neoplasm of left breast in female, estrogen receptor positive, unspecified site of breast ([place]) ? ? ? ? ?Problem  List ?Patient Active Problem List  ? Diagnosis Date Noted  ? SOB (  shortness of breath) 01/22/2021  ? HTN (hypertension) 11/11/2020  ? Hospice care patient 06/05/2020  ? Acute hypoxemic respiratory failure ([place]) 05/28/2020  ? Lymphedema 05/27/2020  ? Bilateral lower extremity edema 05/27/2020  ? Malignant pleural effusion 05/27/2020  ? Anxiety state 05/21/2020  ? Carcinoma of breast metastatic to bone ([place]) 03/19/2020  ? Protein-calorie malnutrition, severe 03/04/2020  ? Pressure injury of skin 02/28/2020  ? Bone metastases ([place]) 02/27/2020  ? Elevated LFTs 02/27/2020  ? Elevated troponin 02/27/2020  ? Pleural effusion 02/27/2020  ? Breast mass, left 02/27/2020  ? Sinus tachycardia 02/27/2020  ? ? ?Claris Pong, PT ?04/07/2021, 9:58 AM ? ?[place] ?[facility] [place] ?[place], [place], [address] ?Phone: [phone]   Fax:  [phone] ? ?Name: DAQUANA PADDOCK ?MRN: [id number] ?Date of Birth: Dec 13, 1940 ? ? ? ?

## 2021-04-08 ENCOUNTER — Other Ambulatory Visit: Payer: Self-pay

## 2021-04-08 DIAGNOSIS — C50919 Malignant neoplasm of unspecified site of unspecified female breast: Secondary | ICD-10-CM

## 2021-04-09 ENCOUNTER — Inpatient Hospital Stay: Payer: Medicare HMO

## 2021-04-09 ENCOUNTER — Other Ambulatory Visit: Payer: Self-pay

## 2021-04-09 ENCOUNTER — Inpatient Hospital Stay: Payer: Medicare HMO | Attending: Hematology and Oncology

## 2021-04-09 ENCOUNTER — Other Ambulatory Visit: Payer: Self-pay | Admitting: Hematology and Oncology

## 2021-04-09 ENCOUNTER — Ambulatory Visit: Payer: Medicare HMO

## 2021-04-09 VITALS — BP 148/64 | HR 84 | Temp 98.8°F | Resp 20 | Wt 127.8 lb

## 2021-04-09 DIAGNOSIS — Z170 Estrogen receptor positive status [ER+]: Secondary | ICD-10-CM | POA: Insufficient documentation

## 2021-04-09 DIAGNOSIS — C50912 Malignant neoplasm of unspecified site of left female breast: Secondary | ICD-10-CM

## 2021-04-09 DIAGNOSIS — I890 Lymphedema, not elsewhere classified: Secondary | ICD-10-CM | POA: Insufficient documentation

## 2021-04-09 DIAGNOSIS — C50412 Malignant neoplasm of upper-outer quadrant of left female breast: Secondary | ICD-10-CM | POA: Diagnosis not present

## 2021-04-09 DIAGNOSIS — Z79899 Other long term (current) drug therapy: Secondary | ICD-10-CM | POA: Insufficient documentation

## 2021-04-09 DIAGNOSIS — C7951 Secondary malignant neoplasm of bone: Secondary | ICD-10-CM | POA: Insufficient documentation

## 2021-04-09 DIAGNOSIS — F411 Generalized anxiety disorder: Secondary | ICD-10-CM

## 2021-04-09 DIAGNOSIS — Z5111 Encounter for antineoplastic chemotherapy: Secondary | ICD-10-CM | POA: Diagnosis not present

## 2021-04-09 DIAGNOSIS — Z923 Personal history of irradiation: Secondary | ICD-10-CM | POA: Insufficient documentation

## 2021-04-09 DIAGNOSIS — M25612 Stiffness of left shoulder, not elsewhere classified: Secondary | ICD-10-CM | POA: Diagnosis not present

## 2021-04-09 LAB — CBC WITH DIFFERENTIAL (CANCER CENTER ONLY)
Abs Immature Granulocytes: 0.02 10*3/uL (ref 0.00–0.07)
Basophils Absolute: 0 10*3/uL (ref 0.0–0.1)
Basophils Relative: 0 %
Eosinophils Absolute: 0.1 10*3/uL (ref 0.0–0.5)
Eosinophils Relative: 2 %
HCT: 33.2 % — ABNORMAL LOW (ref 36.0–46.0)
Hemoglobin: 11.4 g/dL — ABNORMAL LOW (ref 12.0–15.0)
Immature Granulocytes: 0 %
Lymphocytes Relative: 19 %
Lymphs Abs: 1.4 10*3/uL (ref 0.7–4.0)
MCH: 27.3 pg (ref 26.0–34.0)
MCHC: 34.3 g/dL (ref 30.0–36.0)
MCV: 79.6 fL — ABNORMAL LOW (ref 80.0–100.0)
Monocytes Absolute: 0.9 10*3/uL (ref 0.1–1.0)
Monocytes Relative: 13 %
Neutro Abs: 4.8 10*3/uL (ref 1.7–7.7)
Neutrophils Relative %: 66 %
Platelet Count: 252 10*3/uL (ref 150–400)
RBC: 4.17 MIL/uL (ref 3.87–5.11)
RDW: 13.9 % (ref 11.5–15.5)
WBC Count: 7.2 10*3/uL (ref 4.0–10.5)
nRBC: 0 % (ref 0.0–0.2)

## 2021-04-09 LAB — CMP (CANCER CENTER ONLY)
ALT: 17 U/L (ref 0–44)
AST: 29 U/L (ref 15–41)
Albumin: 3.8 g/dL (ref 3.5–5.0)
Alkaline Phosphatase: 109 U/L (ref 38–126)
Anion gap: 9 (ref 5–15)
BUN: 20 mg/dL (ref 8–23)
CO2: 26 mmol/L (ref 22–32)
Calcium: 9.9 mg/dL (ref 8.9–10.3)
Chloride: 104 mmol/L (ref 98–111)
Creatinine: 1.03 mg/dL — ABNORMAL HIGH (ref 0.44–1.00)
GFR, Estimated: 55 mL/min — ABNORMAL LOW (ref >60)
Glucose, Bld: 243 mg/dL — ABNORMAL HIGH (ref 70–99)
Potassium: 4.5 mmol/L (ref 3.5–5.1)
Sodium: 139 mmol/L (ref 135–145)
Total Bilirubin: 0.4 mg/dL (ref 0.3–1.2)
Total Protein: 7.1 g/dL (ref 6.5–8.1)

## 2021-04-09 MED ORDER — SODIUM CHLORIDE 0.9 % IV SOLN: Freq: Once | INTRAVENOUS | Status: AC

## 2021-04-09 MED ORDER — TRASTUZUMAB-ANNS CHEMO 150 MG IV SOLR
6.0000 mg/kg | Freq: Once | INTRAVENOUS | Status: AC
  Administered 2021-04-09: 16:00:00 336 mg via INTRAVENOUS
  Filled 2021-04-09: qty 16

## 2021-04-09 NOTE — Therapy (Signed)
[place] ?[facility] [place] ?[place], [place], [address] ?Phone: [phone]   Fax:  [phone] ? ?Physical Therapy Treatment ? ?Patient Details  ?Name: Morgan Yu ?MRN: [id number] ?Date of Birth: [birth year] ?Referring Provider (PT): Iruku ? ? ?Encounter Date: 04/09/2021 ? ? PT End of Session - 04/09/21 1157   ? ? Visit Number 25   ? Number of Visits 29   ? Date for PT Re-Evaluation 04/15/21   ? PT Start Time [phone]   ? PT Stop Time 3559   ? PT Time Calculation (min) 54 min   ? Activity Tolerance Patient tolerated treatment well   ? Behavior During Therapy [facility] for tasks assessed/performed   ? ?  ?  ? ?  ? ? ?Past Medical History:  ?Diagnosis Date  ? Breast cancer ([place])   ? with bone metastasis  ? ? ?Past Surgical History:  ?Procedure Laterality Date  ? IR PERC PLEURAL DRAIN W/INDWELL CATH W/IMG GUIDE  06/03/2020  ? IR RADIOLOGIST EVAL & MGMT  07/31/2020  ? IR REMOVAL OF PLURAL CATH W/CUFF  10/31/2020  ? ? ?There were no vitals filed for this visit. ? ? Subjective Assessment - 04/09/21 0951   ? ? Subjective The wrap was fine. I showered with my cast cover.  I have to restart my infusions today   ? Pertinent History breast cancer with mets, hepatic, osseous, pulmonary, and axillary nodal metastasis, axillary chest wall mets, biopsy 02/29/20, ER+ PR- HER2+, pt was receiving radiation in April 2022 and had to stop due to anxiety from fluid build up, pt had a drain placed to drain fluid from abdominal cavity from low protein, pt taking herceptin, drain has been out since June 26th, pt has had lymphedema in LUE since Jan 2021 when she was diagnosed   ? Patient Stated Goals to get arm swelling down   ? Currently in Pain? No/denies   ? Pain Score 0-No pain   ? ?  ?  ? ?  ? ? ? ? ? ? ? ? ? ? ? ? ? ? ? ? ? ? ? ? [place] Adult PT Treatment/Exercise - 04/09/21 0001   ? ?  ? Manual Therapy  ? Edema Management bandages removed;pt washed arm   ? Manual Lymphatic Drainage (MLD) in supine  : short neck, 5 diaphragmatic breaths, right axillary nodes and establishment of interaxillary pathway, left inguinal nodes and establishment of axillo inguinal pathway, LUE working proximal to distal then retracing all steps   ? Compression Bandaging lotion then thick stockinette from hand to axilla, artiflex from wrist to axilla  then 1 6 cm bandage to hand and halfway up forearm (forearm done in herring bone fashion), 1 8 cm wrist to axilla in herringbone, 1 10cm from wrist to axilla,   ? ?  ?  ? ?  ? ? ? ? ? ? ? ? ? ? ? ? ? ? ? PT Long Term Goals - 03/18/21 1307   ? ?  ? PT LONG TERM GOAL #1  ? Title Pt will demonstrate a 5 cm decrease in edema 15 cm proximal to ulnar styloid process to decrease risk of infection.   ? Baseline 29 cm; 03/02/21- 25.5, 03/09/2021: 23   ? Time 6   ? Period Weeks   ? Status Achieved   ?  ? PT LONG TERM GOAL #2  ? Title Pt will receive trial of FLexiTouch compression pump for long term  management of lymphedema   ? Baseline 03/02/21- demographics sent; 03/18/21- pt has recieved her FlexiTouch compression pump   ? Time 6   ? Period Weeks   ? Status Achieved   ?  ? PT LONG TERM GOAL #3  ? Title Pt will be independent in self MLD for long term management of lymphedema.   ? Time 6   ? Period Weeks   ? Status On-going   ?  ? PT LONG TERM GOAL #4  ? Title Pt will receive an appropriate compression garment for long term management of lymphedema.   ? Baseline 03/02/21- pt is still demonstrating reduction; 03/18/21-pt is awaiting arrival of her compression garments   ? Time 6   ? Period Weeks   ? Status On-going   ?  ? PT LONG TERM GOAL #5  ? Title Pt will demonstrate 120 degrees of L shoulder abduction to allow pt to reach out to the side.   ? Baseline 64; 03/18/21- 105   ? Time 6   ? Period Weeks   ? Status On-going   ?  ? PT LONG TERM GOAL #6  ? Title Pt will be independent in a home exercise program for continued stretching and strengthening.   ? Time 6   ? Period Weeks   ? Status On-going   ? ?  ?   ? ?  ? ? ? ? ? ? ? ? Plan - 04/09/21 1056   ? ? Clinical Impression Statement Pts wrap removed. posterior elbow looked much better today and had minimal bogginess/pitting. Continued MLD and compression bandaging.  Pt is still awaiting her new sleeve and requires treatment until her new garment is received.   ? Examination-Activity Limitations Caring for Others;Reach Overhead;Lift   ? Stability/Clinical Decision Making Stable/Uncomplicated   ? Rehab Potential Good   ? PT Frequency 3x / week   ? PT Duration 4 weeks   ? PT Treatment/Interventions ADLs/Self Care Home Management;Patient/family education;Therapeutic exercise;Manual techniques;Manual lymph drainage;Compression bandaging;Orthotic Fit/Training;Passive range of motion;Vasopneumatic Device   ? PT Next Visit Plan continue CDT until pt gets new sleeve, PROM, AAROM left shoulder   ? Consulted and Agree with Plan of Care Patient   ? ?  ?  ? ?  ? ? ?Patient will benefit from skilled therapeutic intervention in order to improve the following deficits and impairments:  Postural dysfunction, Impaired UE functional use, Increased fascial restricitons, Decreased strength, Decreased range of motion, Increased edema ? ?Visit Diagnosis: ?Lymphedema, not elsewhere classified ? ?Stiffness of left shoulder, not elsewhere classified ? ?Malignant neoplasm of left breast in female, estrogen receptor positive, unspecified site of breast ([place]) ? ? ? ? ?Problem List ?Patient Active Problem List  ? Diagnosis Date Noted  ? SOB (shortness of breath) 01/22/2021  ? HTN (hypertension) 11/11/2020  ? Hospice care patient 06/05/2020  ? Acute hypoxemic respiratory failure ([place]) 05/28/2020  ? Lymphedema 05/27/2020  ? Bilateral lower extremity edema 05/27/2020  ? Malignant pleural effusion 05/27/2020  ? Anxiety state 05/21/2020  ? Carcinoma of breast metastatic to bone ([place]) 03/19/2020  ? Protein-calorie malnutrition, severe 03/04/2020  ? Pressure injury of skin 02/28/2020  ? Bone metastases  ([place]) 02/27/2020  ? Elevated LFTs 02/27/2020  ? Elevated troponin 02/27/2020  ? Pleural effusion 02/27/2020  ? Breast mass, left 02/27/2020  ? Sinus tachycardia 02/27/2020  ? ? ?Claris Pong, PT ?04/09/2021, 10:59 AM ? ?[place] ?[facility] [place] ?[place],  [place], [address] ?Phone: [phone]   Fax:  [phone] ? ?Name: Morgan Yu ?MRN: [id number] ?Date of Birth: [birth year] ? ? ? ?

## 2021-04-09 NOTE — Progress Notes (Signed)
Ok to proceed with restarting Trastuzumab today, Dr Lindi Adie. No reload. ? ?Acquanetta Belling, [place], BCPS, BCOP ?04/09/2021 ?2:38 PM ? ?

## 2021-04-09 NOTE — Patient Instructions (Signed)

## 2021-04-09 NOTE — Patient Instructions (Signed)
[facility]  Discharge Instructions: ?Thank you for choosing [facility] to provide your oncology and hematology care.  ? ?If you have a lab appointment with the Cancer Center, please go directly to the Cancer Center and check in at the registration area. ?  ?Wear comfortable clothing and clothing appropriate for easy access to any Portacath or PICC line.  ? ?We strive to give you quality time with your provider. You may need to reschedule your appointment if you arrive late (15 or more minutes).  Arriving late affects you and other patients whose appointments are after yours.  Also, if you miss three or more appointments without notifying the office, you may be dismissed from the clinic at the provider?s discretion.    ?  ?For prescription refill requests, have your pharmacy contact our office and allow 72 hours for refills to be completed.   ? ?Today you received the following chemotherapy and/or immunotherapy agents: Trastuzumab    ?  ?To help prevent nausea and vomiting after your treatment, we encourage you to take your nausea medication as directed. ? ?BELOW ARE SYMPTOMS THAT SHOULD BE REPORTED IMMEDIATELY: ?*FEVER GREATER THAN 100.4 F (38 ?C) OR HIGHER ?*CHILLS OR SWEATING ?*NAUSEA AND VOMITING THAT IS NOT CONTROLLED WITH YOUR NAUSEA MEDICATION ?*UNUSUAL SHORTNESS OF BREATH ?*UNUSUAL BRUISING OR BLEEDING ?*URINARY PROBLEMS (pain or burning when urinating, or frequent urination) ?*BOWEL PROBLEMS (unusual diarrhea, constipation, pain near the anus) ?TENDERNESS IN MOUTH AND THROAT WITH OR WITHOUT PRESENCE OF ULCERS (sore throat, sores in mouth, or a toothache) ?UNUSUAL RASH, SWELLING OR PAIN  ?UNUSUAL VAGINAL DISCHARGE OR ITCHING  ? ?Items with * indicate a potential emergency and should be followed up as soon as possible or go to the Emergency Department if any problems should occur. ? ?Please show the CHEMOTHERAPY ALERT CARD or IMMUNOTHERAPY ALERT CARD at check-in  to the Emergency Department and triage nurse. ? ?Should you have questions after your visit or need to cancel or reschedule your appointment, please contact [facility]  Dept: [phone]  and follow the prompts.  Office hours are 8:00 a.m. to 4:30 p.m. Monday - Friday. Please note that voicemails left after 4:00 p.m. may not be returned until the following business day.  We are closed weekends and major holidays. You have access to a nurse at all times for urgent questions. Please call the main number to the clinic Dept: [phone] and follow the prompts. ? ? ?For any non-urgent questions, you may also contact your provider using MyChart. We now offer e-Visits for anyone 18 and older to request care online for non-urgent symptoms. For details visit [email or web address]. ?  ?Also download the MyChart app! Go to the app store, search "MyChart", open the app, select [facility], and log in with your MyChart username and password. ? ?Due to Covid, a mask is required upon entering the hospital/clinic. If you do not have a mask, one will be given to you upon arrival. For doctor visits, patients may have 1 support person aged 18 or older with them. For treatment visits, patients cannot have anyone with them due to current Covid guidelines and our immunocompromised population.  ? ?

## 2021-04-09 NOTE — Progress Notes (Signed)
Pt states she plans to make an appointment for necessary dental work in the next few weeks. Zometa infusion held.  ?

## 2021-04-14 ENCOUNTER — Encounter: Payer: Self-pay | Admitting: Physical Therapy

## 2021-04-14 ENCOUNTER — Other Ambulatory Visit: Payer: Self-pay

## 2021-04-14 ENCOUNTER — Ambulatory Visit: Payer: Medicare HMO | Admitting: Physical Therapy

## 2021-04-14 DIAGNOSIS — I890 Lymphedema, not elsewhere classified: Secondary | ICD-10-CM

## 2021-04-14 DIAGNOSIS — Z170 Estrogen receptor positive status [ER+]: Secondary | ICD-10-CM | POA: Diagnosis not present

## 2021-04-14 DIAGNOSIS — C50912 Malignant neoplasm of unspecified site of left female breast: Secondary | ICD-10-CM

## 2021-04-14 DIAGNOSIS — M25612 Stiffness of left shoulder, not elsewhere classified: Secondary | ICD-10-CM | POA: Diagnosis not present

## 2021-04-14 NOTE — Therapy (Signed)
?[place] [facility] [place] ?[place], [place], [address] ?Phone: [phone]   Fax:  [phone] ? ?Physical Therapy Treatment ? ?Patient Details  ?Name: Morgan Yu ?MRN: [id number] ?Date of Birth: Dec 05, 1940 ?Referring Provider (PT): Iruku ? ? ?Encounter Date: 04/14/2021 ? ? PT End of Session - 04/14/21 1054   ? ? Visit Number 26   ? Number of Visits 29   ? Date for PT Re-Evaluation 04/15/21   ? PT Start Time [phone]   ? PT Stop Time 1051   ? PT Time Calculation (min) 56 min   ? Activity Tolerance Patient tolerated treatment well   ? Behavior During Therapy [facility] for tasks assessed/performed   ? ?  ?  ? ?  ? ? ?Past Medical History:  ?Diagnosis Date  ? Breast cancer ([place])   ? with bone metastasis  ? ? ?Past Surgical History:  ?Procedure Laterality Date  ? IR PERC PLEURAL DRAIN W/INDWELL CATH W/IMG GUIDE  06/03/2020  ? IR RADIOLOGIST EVAL & MGMT  07/31/2020  ? IR REMOVAL OF PLURAL CATH W/CUFF  10/31/2020  ? ? ?There were no vitals filed for this visit. ? ? Subjective Assessment - 04/14/21 1057   ? ? Subjective I took the wrap off and washed the bandages.   ? Pertinent History breast cancer with mets, hepatic, osseous, pulmonary, and axillary nodal metastasis, axillary chest wall mets, biopsy 02/29/20, ER+ PR- HER2+, pt was receiving radiation in April 2022 and had to stop due to anxiety from fluid build up, pt had a drain placed to drain fluid from abdominal cavity from low protein, pt taking herceptin, drain has been out since June 26th, pt has had lymphedema in LUE since Jan 2021 when she was diagnosed   ? Patient Stated Goals to get arm swelling down   ? Currently in Pain? No/denies   ? Pain Score 0-No pain   ? ?  ?  ? ?  ? ? ? ? ? ? ? ? ? ? ? ? ? ? ? ? ? ? ? ? [facility] Adult PT Treatment/Exercise - 04/14/21 0001   ? ?  ? Manual Therapy  ? Manual Lymphatic Drainage (MLD) in supine : short neck, 5 diaphragmatic breaths, right axillary nodes and establishment of  interaxillary pathway, left inguinal nodes and establishment of axillo inguinal pathway, LUE working proximal to distal then retracing all steps   ? Compression Bandaging lotion then thick stockinette from hand to axilla, artiflex from wrist to axilla  then 1 6 cm bandage to hand and halfway up forearm (forearm done in herring bone fashion), 1 8 cm wrist to axilla in herringbone, 1 10cm from wrist to axilla,   ? ?  ?  ? ?  ? ? ? ? ? ? ? ? ? ? ? ? ? ? ? PT Long Term Goals - 03/18/21 1307   ? ?  ? PT LONG TERM GOAL #1  ? Title Pt will demonstrate a 5 cm decrease in edema 15 cm proximal to ulnar styloid process to decrease risk of infection.   ? Baseline 29 cm; 03/02/21- 25.5, 03/09/2021: 23   ? Time 6   ? Period Weeks   ? Status Achieved   ?  ? PT LONG TERM GOAL #2  ? Title Pt will receive trial of FLexiTouch compression pump for long term management of lymphedema   ? Baseline 03/02/21- demographics sent; 03/18/21- pt has recieved her FlexiTouch compression pump   ?  Time 6   ? Period Weeks   ? Status Achieved   ?  ? PT LONG TERM GOAL #3  ? Title Pt will be independent in self MLD for long term management of lymphedema.   ? Time 6   ? Period Weeks   ? Status On-going   ?  ? PT LONG TERM GOAL #4  ? Title Pt will receive an appropriate compression garment for long term management of lymphedema.   ? Baseline 03/02/21- pt is still demonstrating reduction; 03/18/21-pt is awaiting arrival of her compression garments   ? Time 6   ? Period Weeks   ? Status On-going   ?  ? PT LONG TERM GOAL #5  ? Title Pt will demonstrate 120 degrees of L shoulder abduction to allow pt to reach out to the side.   ? Baseline 64; 03/18/21- 105   ? Time 6   ? Period Weeks   ? Status On-going   ?  ? PT LONG TERM GOAL #6  ? Title Pt will be independent in a home exercise program for continued stretching and strengthening.   ? Time 6   ? Period Weeks   ? Status On-going   ? ?  ?  ? ?  ? ? ? ? ? ? ? ? Plan - 04/14/21 1056   ? ? Clinical Impression Statement  Pt is still waiting for her custom compression sleeve to arrive. She demonstrates good reduction with increased skin mobility. She had removed her bandages and washed them last night. Continued with MLD and compression bandaging until her compression sleeve arrives. She called and it had not come in yet.   ? PT Frequency 3x / week   ? PT Duration 4 weeks   ? PT Treatment/Interventions ADLs/Self Care Home Management;Patient/family education;Therapeutic exercise;Manual techniques;Manual lymph drainage;Compression bandaging;Orthotic Fit/Training;Passive range of motion;Vasopneumatic Device   ? PT Next Visit Plan update POC, continue CDT until pt gets new sleeve, PROM, AAROM left shoulder   ? Consulted and Agree with Plan of Care Patient   ? ?  ?  ? ?  ? ? ?Patient will benefit from skilled therapeutic intervention in order to improve the following deficits and impairments:  Postural dysfunction, Impaired UE functional use, Increased fascial restricitons, Decreased strength, Decreased range of motion, Increased edema ? ?Visit Diagnosis: ?Lymphedema, not elsewhere classified ? ?Stiffness of left shoulder, not elsewhere classified ? ?Malignant neoplasm of left breast in female, estrogen receptor positive, unspecified site of breast ([place]) ? ? ? ? ?Problem List ?Patient Active Problem List  ? Diagnosis Date Noted  ? SOB (shortness of breath) 01/22/2021  ? HTN (hypertension) 11/11/2020  ? Hospice care patient 06/05/2020  ? Acute hypoxemic respiratory failure ([place]) 05/28/2020  ? Lymphedema 05/27/2020  ? Bilateral lower extremity edema 05/27/2020  ? Malignant pleural effusion 05/27/2020  ? Anxiety state 05/21/2020  ? Carcinoma of breast metastatic to bone ([place]) 03/19/2020  ? Protein-calorie malnutrition, severe 03/04/2020  ? Pressure injury of skin 02/28/2020  ? Bone metastases ([place]) 02/27/2020  ? Elevated LFTs 02/27/2020  ? Elevated troponin 02/27/2020  ? Pleural effusion 02/27/2020  ? Breast mass, left 02/27/2020  ? Sinus  tachycardia 02/27/2020  ? ? ?[name] [place], PT ?04/14/2021, 10:57 AM ? ?[place] ?[facility] @ [place] ?[place], [place], [address] ?Phone: [phone]   Fax:  [phone] ? ?Name: Morgan Yu ?MRN: [id number] ?Date of Birth: August 02, 1940 ? ? ? ?

## 2021-04-16 ENCOUNTER — Other Ambulatory Visit: Payer: Self-pay

## 2021-04-16 ENCOUNTER — Ambulatory Visit: Payer: Medicare HMO

## 2021-04-16 DIAGNOSIS — Z170 Estrogen receptor positive status [ER+]: Secondary | ICD-10-CM | POA: Diagnosis not present

## 2021-04-16 DIAGNOSIS — I890 Lymphedema, not elsewhere classified: Secondary | ICD-10-CM | POA: Diagnosis not present

## 2021-04-16 DIAGNOSIS — C50912 Malignant neoplasm of unspecified site of left female breast: Secondary | ICD-10-CM | POA: Diagnosis not present

## 2021-04-16 DIAGNOSIS — M25612 Stiffness of left shoulder, not elsewhere classified: Secondary | ICD-10-CM | POA: Diagnosis not present

## 2021-04-16 NOTE — Therapy (Signed)
[place] ?[facility] ?[place], [place], [address] ?Phone: [phone]   Fax:  [phone] ? ?Physical Therapy Treatment ? ?Patient Details  ?Name: Morgan Yu ?MRN: [id number] ?Date of Birth: [birth year] ?Referring Provider (PT): Iruku ? ? ?Encounter Date: 04/16/2021 ? ? PT End of Session - 04/16/21 1359   ? ? Visit Number 27   ? Number of Visits 35   ? Date for PT Re-Evaluation 05/14/21   ? PT Start Time 1400   ? PT Stop Time 5631   ? PT Time Calculation (min) 59 min   ? Activity Tolerance Patient tolerated treatment well   ? Behavior During Therapy [facility] for tasks assessed/performed   ? ?  ?  ? ?  ? ? ?Past Medical History:  ?Diagnosis Date  ? Breast cancer ([place])   ? with bone metastasis  ? ? ?Past Surgical History:  ?Procedure Laterality Date  ? IR PERC PLEURAL DRAIN W/INDWELL CATH W/IMG GUIDE  06/03/2020  ? IR RADIOLOGIST EVAL & MGMT  07/31/2020  ? IR REMOVAL OF PLURAL CATH W/CUFF  10/31/2020  ? ? ?There were no vitals filed for this visit. ? ? Subjective Assessment - 04/16/21 1359   ? ? Subjective I got the new garments but I am not sure they fit right.   ? Pertinent History breast cancer with mets, hepatic, osseous, pulmonary, and axillary nodal metastasis, axillary chest wall mets, biopsy 02/29/20, ER+ PR- HER2+, pt was receiving radiation in April 2022 and had to stop due to anxiety from fluid build up, pt had a drain placed to drain fluid from abdominal cavity from low protein, pt taking herceptin, drain has been out since June 26th, pt has had lymphedema in LUE since Jan 2021 when she was diagnosed   ? Currently in Pain? No/denies   ? Pain Score 0-No pain   ? ?  ?  ? ?  ? ? ? ? ? ? ? ? LYMPHEDEMA/ONCOLOGY QUESTIONNAIRE - 04/16/21 0001   ? ?  ? Left Upper Extremity Lymphedema  ? 10 cm Proximal to Olecranon Process 26.1 cm   ? Olecranon Process 22 cm   ? 15 cm Proximal to Ulnar Styloid Process 22 cm   ? 10 cm Proximal to Ulnar Styloid Process 19.6 cm   ?  Just Proximal to Ulnar Styloid Process 14.8 cm   ? At [facility] of 2nd Digit 5.6 cm   ? ?  ?  ? ?  ? ? ? ? ? ? ? ? ? ? ? ? ? [facility] Adult PT Treatment/Exercise - 04/16/21 0001   ? ?  ? Manual Therapy  ? Edema Management Pt came in with new sleeve and glove;slightly short at top and slightly large. Glove large at wrist and thumb. Called Melissa at [facility]. Pt has appt at 2:15 on Mar 21 to be remeasured   ? Manual Lymphatic Drainage (MLD) in supine : short neck, 5 diaphragmatic breaths, right axillary nodes and establishment of interaxillary pathway, left inguinal nodes and establishment of axillo inguinal pathway, LUE working proximal to distal then retracing all steps   ? Compression Bandaging lotion then thick stockinette from hand to axilla, artiflex from wrist to axilla  then 1 6 cm bandage to hand and halfway up forearm (forearm done in herring bone fashion), 1 8 cm wrist to axilla in herringbone, 1 10cm from wrist to axilla,   ? ?  ?  ? ?  ? ? ? ? ? ? ? ? ? ? ? ? ? ? ?  PT Long Term Goals - 03/18/21 1307   ? ?  ? PT LONG TERM GOAL #1  ? Title Pt will demonstrate a 5 cm decrease in edema 15 cm proximal to ulnar styloid process to decrease risk of infection.   ? Baseline 29 cm; 03/02/21- 25.5, 03/09/2021: 23   ? Time 6   ? Period Weeks   ? Status Achieved   ?  ? PT LONG TERM GOAL #2  ? Title Pt will receive trial of FLexiTouch compression pump for long term management of lymphedema   ? Baseline 03/02/21- demographics sent; 03/18/21- pt has recieved her FlexiTouch compression pump   ? Time 6   ? Period Weeks   ? Status Achieved   ?  ? PT LONG TERM GOAL #3  ? Title Pt will be independent in self MLD for long term management of lymphedema.   ? Time 6   ? Period Weeks   ? Status On-going   ?  ? PT LONG TERM GOAL #4  ? Title Pt will receive an appropriate compression garment for long term management of lymphedema.   ? Baseline 03/02/21- pt is still demonstrating reduction; 04/16/21-pt is awaiting arrival of her compression  garments(garments redone and still don't fit. Remeasuring next Tuesday  ? Time 6   ? Period Weeks   ? Status On-going   ?  ? PT LONG TERM GOAL #5  ? Title Pt will demonstrate 120 degrees of L shoulder abduction to allow pt to reach out to the side.   ? Baseline 64; 03/18/21- 105   ? Time 6   ? Period Weeks   ? Status On-going   ?  ? PT LONG TERM GOAL #6  ? Title Pt will be independent in a home exercise program for continued stretching and strengthening.   ? Time 6   ? Period Weeks   ? Status On-going   ? ?  ?  ? ?  ? ? ? ? ? ? ? ? Plan - 04/16/21 1526   ? ? Clinical Impression Statement Pt received her new glove and sleeve, and again they don't fit properly. The sleeve is slightly short and big at the top, and the glove is large at the wrist and thumb. Performed MLD and rewrapped pts arm while she awaits new garments. Called [facility] and informed them that garments still don't fit. Scheduled another appt for her at 2;15 on March 21.  Appt is actually 2:30 but was told to come early so she could be seen early if Lenna Sciara is  done with the other client. Pt will require further physical therapy to maintain edema reduction so that new garments fit when they come in.   ? Examination-Activity Limitations Caring for Others;Reach Overhead;Lift   ? Stability/Clinical Decision Making Stable/Uncomplicated   ? Rehab Potential Good   ? PT Frequency 3x / week   ? PT Duration 4 weeks   ? PT Treatment/Interventions ADLs/Self Care Home Management;Patient/family education;Therapeutic exercise;Manual techniques;Manual lymph drainage;Compression bandaging;Orthotic Fit/Training;Passive range of motion;Vasopneumatic Device   ? PT Next Visit Plan continue CDT until pt gets new sleeve, PROM, AAROM left shoulder   ? Consulted and Agree with Plan of Care Patient   ? ?  ?  ? ?  ? ? ?Patient will benefit from skilled therapeutic intervention in order to improve the following deficits and impairments:  Postural dysfunction, Impaired UE  functional use, Increased fascial restricitons, Decreased strength, Decreased range of motion, Increased edema ? ?  Visit Diagnosis: ?Lymphedema, not elsewhere classified ? ?Stiffness of left shoulder, not elsewhere classified ? ?Malignant neoplasm of left breast in female, estrogen receptor positive, unspecified site of breast ([place]) ? ? ? ? ?Problem List ?Patient Active Problem List  ? Diagnosis Date Noted  ? SOB (shortness of breath) 01/22/2021  ? HTN (hypertension) 11/11/2020  ? Hospice care patient 06/05/2020  ? Acute hypoxemic respiratory failure ([place]) 05/28/2020  ? Lymphedema 05/27/2020  ? Bilateral lower extremity edema 05/27/2020  ? Malignant pleural effusion 05/27/2020  ? Anxiety state 05/21/2020  ? Carcinoma of breast metastatic to bone ([place]) 03/19/2020  ? Protein-calorie malnutrition, severe 03/04/2020  ? Pressure injury of skin 02/28/2020  ? Bone metastases ([place]) 02/27/2020  ? Elevated LFTs 02/27/2020  ? Elevated troponin 02/27/2020  ? Pleural effusion 02/27/2020  ? Breast mass, left 02/27/2020  ? Sinus tachycardia 02/27/2020  ? ? ?Claris Pong, PT ?04/16/2021, 3:37 PM ? ?[place] [place] ?[facility] ?[place], [place], [address] ?Phone: [phone]   Fax:  [phone] ? ?Name: Morgan Yu ?MRN: [id number] ?Date of Birth: Jul 28, 1940 ? ? ? ?

## 2021-04-16 NOTE — Addendum Note (Signed)
Addended by: Claris Pong on: 04/16/2021 03:41 PM ? ? Modules accepted: Orders ? ?

## 2021-04-21 ENCOUNTER — Other Ambulatory Visit: Payer: Self-pay

## 2021-04-21 ENCOUNTER — Ambulatory Visit: Payer: Medicare HMO | Admitting: Physical Therapy

## 2021-04-21 ENCOUNTER — Encounter: Payer: Self-pay | Admitting: Physical Therapy

## 2021-04-21 DIAGNOSIS — C50912 Malignant neoplasm of unspecified site of left female breast: Secondary | ICD-10-CM | POA: Diagnosis not present

## 2021-04-21 DIAGNOSIS — M25612 Stiffness of left shoulder, not elsewhere classified: Secondary | ICD-10-CM

## 2021-04-21 DIAGNOSIS — I890 Lymphedema, not elsewhere classified: Secondary | ICD-10-CM | POA: Diagnosis not present

## 2021-04-21 DIAGNOSIS — Z170 Estrogen receptor positive status [ER+]: Secondary | ICD-10-CM | POA: Diagnosis not present

## 2021-04-21 NOTE — Therapy (Signed)
[facility] ?[facility] ?[place], [place], [address] ?Phone: [phone]   Fax:  [phone] ? ?Physical Therapy Treatment ? ?Patient Details  ?Name: Morgan Yu ?MRN: [id number] ?Date of Birth: Mar 14, 1940 ?Referring Provider (PT): Iruku ? ? ?Encounter Date: 04/21/2021 ? ? PT End of Session - 04/21/21 1555   ? ? Visit Number 28   ? Number of Visits 35   ? Date for PT Re-Evaluation 05/14/21   ? PT Start Time 1522   pt arrived late due to another appt  ? PT Stop Time 1550   ? PT Time Calculation (min) 28 min   ? Activity Tolerance Patient tolerated treatment well   ? Behavior During Therapy [facility] for tasks assessed/performed   ? ?  ?  ? ?  ? ? ?Past Medical History:  ?Diagnosis Date  ? Breast cancer ([place])   ? with bone metastasis  ? ? ?Past Surgical History:  ?Procedure Laterality Date  ? IR PERC PLEURAL DRAIN W/INDWELL CATH W/IMG GUIDE  06/03/2020  ? IR RADIOLOGIST EVAL & MGMT  07/31/2020  ? IR REMOVAL OF PLURAL CATH W/CUFF  10/31/2020  ? ? ?There were no vitals filed for this visit. ? ? Subjective Assessment - 04/21/21 1557   ? ? Subjective I was getting measured for my sleeve. That is why I am late.   ? Pertinent History breast cancer with mets, hepatic, osseous, pulmonary, and axillary nodal metastasis, axillary chest wall mets, biopsy 02/29/20, ER+ PR- HER2+, pt was receiving radiation in April 2022 and had to stop due to anxiety from fluid build up, pt had a drain placed to drain fluid from abdominal cavity from low protein, pt taking herceptin, drain has been out since June 26th, pt has had lymphedema in LUE since Jan 2021 when she was diagnosed   ? Patient Stated Goals to get arm swelling down   ? Currently in Pain? No/denies   ? Pain Score 0-No pain   ? ?  ?  ? ?  ? ? ? ? ? ? ? ? ? ? ? ? ? ? ? ? ? ? ? ? [facility] Adult PT Treatment/Exercise - 04/21/21 0001   ? ?  ? Manual Therapy  ? Manual Lymphatic Drainage (MLD) in supine : short neck, 5 diaphragmatic breaths,  right axillary nodes and establishment of interaxillary pathway, left inguinal nodes and establishment of axillo inguinal pathway, LUE working proximal to distal then retracing all steps   ? Compression Bandaging lotion then thick stockinette from hand to axilla, artiflex from wrist to axilla  then 1 6 cm bandage to hand and halfway up forearm (forearm done in herring bone fashion), 1 8 cm wrist to axilla in herringbone, 1 10cm from wrist to axilla,   ? ?  ?  ? ?  ? ? ? ? ? ? ? ? ? ? ? ? ? ? ? PT Long Term Goals - 03/18/21 1307   ? ?  ? PT LONG TERM GOAL #1  ? Title Pt will demonstrate a 5 cm decrease in edema 15 cm proximal to ulnar styloid process to decrease risk of infection.   ? Baseline 29 cm; 03/02/21- 25.5, 03/09/2021: 23   ? Time 6   ? Period Weeks   ? Status Achieved   ?  ? PT LONG TERM GOAL #2  ? Title Pt will receive trial of FLexiTouch compression pump for long term management of lymphedema   ?  Baseline 03/02/21- demographics sent; 03/18/21- pt has recieved her FlexiTouch compression pump   ? Time 6   ? Period Weeks   ? Status Achieved   ?  ? PT LONG TERM GOAL #3  ? Title Pt will be independent in self MLD for long term management of lymphedema.   ? Time 6   ? Period Weeks   ? Status On-going   ?  ? PT LONG TERM GOAL #4  ? Title Pt will receive an appropriate compression garment for long term management of lymphedema.   ? Baseline 03/02/21- pt is still demonstrating reduction; 03/18/21-pt is awaiting arrival of her compression garments   ? Time 6   ? Period Weeks   ? Status On-going   ?  ? PT LONG TERM GOAL #5  ? Title Pt will demonstrate 120 degrees of L shoulder abduction to allow pt to reach out to the side.   ? Baseline 64; 03/18/21- 105   ? Time 6   ? Period Weeks   ? Status On-going   ?  ? PT LONG TERM GOAL #6  ? Title Pt will be independent in a home exercise program for continued stretching and strengthening.   ? Time 6   ? Period Weeks   ? Status On-going   ? ?  ?  ? ?  ? ? ? ? ? ? ? ? Plan - 04/21/21  1556   ? ? Clinical Impression Statement Pt arrived late today because she was getting remeasured for her compression sleeve at [facility] since it did not fit well. Continued with MLD and compression bandaging. Awaiting arrival of pt's new sleeve and glove.   ? PT Frequency 3x / week   ? PT Duration 4 weeks   ? PT Treatment/Interventions ADLs/Self Care Home Management;Patient/family education;Therapeutic exercise;Manual techniques;Manual lymph drainage;Compression bandaging;Orthotic Fit/Training;Passive range of motion;Vasopneumatic Device   ? PT Next Visit Plan continue CDT until pt gets new sleeve, PROM, AAROM left shoulder   ? Consulted and Agree with Plan of Care Patient   ? ?  ?  ? ?  ? ? ?Patient will benefit from skilled therapeutic intervention in order to improve the following deficits and impairments:  Postural dysfunction, Impaired UE functional use, Increased fascial restricitons, Decreased strength, Decreased range of motion, Increased edema ? ?Visit Diagnosis: ?Lymphedema, not elsewhere classified ? ?Stiffness of left shoulder, not elsewhere classified ? ?Malignant neoplasm of left breast in female, estrogen receptor positive, unspecified site of breast ([place]) ? ? ? ? ?Problem List ?Patient Active Problem List  ? Diagnosis Date Noted  ? SOB (shortness of breath) 01/22/2021  ? HTN (hypertension) 11/11/2020  ? Hospice care patient 06/05/2020  ? Acute hypoxemic respiratory failure ([place]) 05/28/2020  ? Lymphedema 05/27/2020  ? Bilateral lower extremity edema 05/27/2020  ? Malignant pleural effusion 05/27/2020  ? Anxiety state 05/21/2020  ? Carcinoma of breast metastatic to bone ([place]) 03/19/2020  ? Protein-calorie malnutrition, severe 03/04/2020  ? Pressure injury of skin 02/28/2020  ? Bone metastases ([place]) 02/27/2020  ? Elevated LFTs 02/27/2020  ? Elevated troponin 02/27/2020  ? Pleural effusion 02/27/2020  ? Breast mass, left 02/27/2020  ? Sinus tachycardia 02/27/2020  ? ? ?[name] [place],  PT ?04/21/2021, 3:58 PM ? ?[facility] ?[facility] ?[place], [place], [address] ?Phone: [phone]   Fax:  [phone] ? ?Name: SHAINA GULLATT ?MRN: [id number] ?Date of Birth: 01-Aug-1940 ? ? ? ?

## 2021-04-23 ENCOUNTER — Other Ambulatory Visit: Payer: Self-pay

## 2021-04-23 ENCOUNTER — Ambulatory Visit: Payer: Medicare HMO

## 2021-04-23 DIAGNOSIS — I890 Lymphedema, not elsewhere classified: Secondary | ICD-10-CM

## 2021-04-23 DIAGNOSIS — M25612 Stiffness of left shoulder, not elsewhere classified: Secondary | ICD-10-CM | POA: Diagnosis not present

## 2021-04-23 DIAGNOSIS — C50912 Malignant neoplasm of unspecified site of left female breast: Secondary | ICD-10-CM

## 2021-04-23 DIAGNOSIS — Z170 Estrogen receptor positive status [ER+]: Secondary | ICD-10-CM | POA: Diagnosis not present

## 2021-04-23 NOTE — Therapy (Signed)
[facility] ?[facility] ?[place], [place], [address] ?Phone: [phone]   Fax:  [phone] ? ?Physical Therapy Treatment ? ?Patient Details  ?Name: Morgan Yu ?MRN: [id number] ?Date of Birth: Jun 13, 1940 ?Referring Provider (PT): Iruku ? ? ?Encounter Date: 04/23/2021 ? ? PT End of Session - 04/23/21 1003   ? ? Visit Number 29   ? Number of Visits 35   ? Date for PT Re-Evaluation 05/14/21   ? PT Start Time 0908   pt arrive dlate  ? PT Stop Time 1001   ? PT Time Calculation (min) 53 min   ? Activity Tolerance Patient tolerated treatment well   ? Behavior During Therapy [facility] for tasks assessed/performed   ? ?  ?  ? ?  ? ? ?Past Medical History:  ?Diagnosis Date  ? Breast cancer ([place])   ? with bone metastasis  ? ? ?Past Surgical History:  ?Procedure Laterality Date  ? IR PERC PLEURAL DRAIN W/INDWELL CATH W/IMG GUIDE  06/03/2020  ? IR RADIOLOGIST EVAL & MGMT  07/31/2020  ? IR REMOVAL OF PLURAL CATH W/CUFF  10/31/2020  ? ? ?There were no vitals filed for this visit. ? ? Subjective Assessment - 04/23/21 0915   ? ? Subjective I hope they get my sleeve right this time. I have a red spot in my thumb webspace.   ? Pertinent History breast cancer with mets, hepatic, osseous, pulmonary, and axillary nodal metastasis, axillary chest wall mets, biopsy 02/29/20, ER+ PR- HER2+, pt was receiving radiation in April 2022 and had to stop due to anxiety from fluid build up, pt had a drain placed to drain fluid from abdominal cavity from low protein, pt taking herceptin, drain has been out since June 26th, pt has had lymphedema in LUE since Jan 2021 when she was diagnosed   ? Patient Stated Goals to get arm swelling down   ? Currently in Pain? No/denies   ? ?  ?  ? ?  ? ? ? ? ? ? ? ? LYMPHEDEMA/ONCOLOGY QUESTIONNAIRE - 04/23/21 0001   ? ?  ? Left Upper Extremity Lymphedema  ? 10 cm Proximal to Olecranon Process 25.5 cm   ? Olecranon Process 22.1 cm   ? 15 cm Proximal to Ulnar Styloid  Process 22.2 cm   ? 10 cm Proximal to Ulnar Styloid Process 19.2 cm   ? Just Proximal to Ulnar Styloid Process 14.4 cm   ? At [facility] of 2nd Digit 5.7 cm   ? ?  ?  ? ?  ? ? ? ? ? ? ? ? ? ? ? ? ? [facility] Adult PT Treatment/Exercise - 04/23/21 0001   ? ?  ? Manual Therapy  ? Manual Lymphatic Drainage (MLD) in supine : short neck, 5 diaphragmatic breaths, right axillary nodes and establishment of interaxillary pathway, left inguinal nodes and establishment of axillo inguinal pathway, LUE working proximal to distal then retracing all steps   ? Compression Bandaging lotion then thick stockinette from hand to axilla, artiflex from wrist to axilla with small 1/2" gray foam in TG soft placed at thumb webspace where redness present, then 1 6 cm bandage to hand and halfway up forearm, 1 8 cm wrist to axilla, 1 10cm from wrist to axilla,   ? ?  ?  ? ?  ? ? ? ? ? ? ? ? ? ? ? ? ? ? ? PT Long Term Goals - 03/18/21 1307   ? ?  ?  PT LONG TERM GOAL #1  ? Title Pt will demonstrate a 5 cm decrease in edema 15 cm proximal to ulnar styloid process to decrease risk of infection.   ? Baseline 29 cm; 03/02/21- 25.5, 03/09/2021: 23   ? Time 6   ? Period Weeks   ? Status Achieved   ?  ? PT LONG TERM GOAL #2  ? Title Pt will receive trial of FLexiTouch compression pump for long term management of lymphedema   ? Baseline 03/02/21- demographics sent; 03/18/21- pt has recieved her FlexiTouch compression pump   ? Time 6   ? Period Weeks   ? Status Achieved   ?  ? PT LONG TERM GOAL #3  ? Title Pt will be independent in self MLD for long term management of lymphedema.   ? Time 6   ? Period Weeks   ? Status On-going   ?  ? PT LONG TERM GOAL #4  ? Title Pt will receive an appropriate compression garment for long term management of lymphedema.   ? Baseline 03/02/21- pt is still demonstrating reduction; 03/18/21-pt is awaiting arrival of her compression garments   ? Time 6   ? Period Weeks   ? Status On-going   ?  ? PT LONG TERM GOAL #5  ? Title Pt will demonstrate  120 degrees of L shoulder abduction to allow pt to reach out to the side.   ? Baseline 64; 03/18/21- 105   ? Time 6   ? Period Weeks   ? Status On-going   ?  ? PT LONG TERM GOAL #6  ? Title Pt will be independent in a home exercise program for continued stretching and strengthening.   ? Time 6   ? Period Weeks   ? Status On-going   ? ?  ?  ? ?  ? ? ? ? ? ? ? ? Plan - 04/23/21 1004   ? ? Clinical Impression Statement Pt returns wearing her compresion bandages from  last session. Continue with complete decongestive therapy today to Lt UE. Added gray foam at thumb webspace to help decrease irritation from bandages rubbing here. Pt reports this feeling better once bandage complete. Awaiting arrival of new compression sleeve and glove.   ? Examination-Activity Limitations Caring for Others;Reach Overhead;Lift   ? Stability/Clinical Decision Making Stable/Uncomplicated   ? Rehab Potential Good   ? PT Frequency 3x / week   ? PT Duration 4 weeks   ? PT Treatment/Interventions ADLs/Self Care Home Management;Patient/family education;Therapeutic exercise;Manual techniques;Manual lymph drainage;Compression bandaging;Orthotic Fit/Training;Passive range of motion;Vasopneumatic Device   ? PT Next Visit Plan continue CDT until pt gets new sleeve, PROM, AAROM left shoulder   ? Consulted and Agree with Plan of Care Patient   ? ?  ?  ? ?  ? ? ?Patient will benefit from skilled therapeutic intervention in order to improve the following deficits and impairments:  Postural dysfunction, Impaired UE functional use, Increased fascial restricitons, Decreased strength, Decreased range of motion, Increased edema ? ?Visit Diagnosis: ?Lymphedema, not elsewhere classified ? ?Stiffness of left shoulder, not elsewhere classified ? ?Malignant neoplasm of left breast in female, estrogen receptor positive, unspecified site of breast ([place]) ? ? ? ? ?Problem List ?Patient Active Problem List  ? Diagnosis Date Noted  ? SOB (shortness of breath) 01/22/2021   ? HTN (hypertension) 11/11/2020  ? Hospice care patient 06/05/2020  ? Acute hypoxemic respiratory failure ([place]) 05/28/2020  ? Lymphedema 05/27/2020  ? Bilateral lower extremity edema  05/27/2020  ? Malignant pleural effusion 05/27/2020  ? Anxiety state 05/21/2020  ? Carcinoma of breast metastatic to bone ([place]) 03/19/2020  ? Protein-calorie malnutrition, severe 03/04/2020  ? Pressure injury of skin 02/28/2020  ? Bone metastases ([place]) 02/27/2020  ? Elevated LFTs 02/27/2020  ? Elevated troponin 02/27/2020  ? Pleural effusion 02/27/2020  ? Breast mass, left 02/27/2020  ? Sinus tachycardia 02/27/2020  ? ? ?Otelia Limes, PTA ?04/23/2021, 10:05 AM ? ?[facility] ?[facility] [place] ?[place], [place], [address] ?Phone: [phone]   Fax:  [phone] ? ?Name: RAFAELITA FOISTER ?MRN: [id number] ?Date of Birth: 1940-05-28 ? ? ? ?

## 2021-04-28 ENCOUNTER — Encounter: Payer: Self-pay | Admitting: Physical Therapy

## 2021-04-28 ENCOUNTER — Other Ambulatory Visit: Payer: Self-pay

## 2021-04-28 ENCOUNTER — Ambulatory Visit: Payer: Medicare HMO | Admitting: Physical Therapy

## 2021-04-28 DIAGNOSIS — I890 Lymphedema, not elsewhere classified: Secondary | ICD-10-CM | POA: Diagnosis not present

## 2021-04-28 DIAGNOSIS — M25612 Stiffness of left shoulder, not elsewhere classified: Secondary | ICD-10-CM | POA: Diagnosis not present

## 2021-04-28 DIAGNOSIS — C50912 Malignant neoplasm of unspecified site of left female breast: Secondary | ICD-10-CM | POA: Diagnosis not present

## 2021-04-28 DIAGNOSIS — Z170 Estrogen receptor positive status [ER+]: Secondary | ICD-10-CM | POA: Diagnosis not present

## 2021-04-28 NOTE — Therapy (Signed)
[facility] [place] ?[facility] [place] ?[place], [place], [address] ?Phone: [phone]   Fax:  [phone] ? ?Physical Therapy Treatment ? ?Patient Details  ?Name: Morgan Yu ?MRN: [id number] ?Date of Birth: Jan 25, 1941 ?Referring Provider (PT): Iruku ? ? ?Encounter Date: 04/28/2021 ? ? PT End of Session - 04/28/21 1057   ? ? Visit Number 30   add kx  ? Number of Visits 35   ? Date for PT Re-Evaluation 05/14/21   ? PT Start Time 1001   ? PT Stop Time 1054   ? PT Time Calculation (min) 53 min   ? Activity Tolerance Patient tolerated treatment well   ? Behavior During Therapy [facility] for tasks assessed/performed   ? ?  ?  ? ?  ? ? ?Past Medical History:  ?Diagnosis Date  ? Breast cancer ([place])   ? with bone metastasis  ? ? ?Past Surgical History:  ?Procedure Laterality Date  ? IR PERC PLEURAL DRAIN W/INDWELL CATH W/IMG GUIDE  06/03/2020  ? IR RADIOLOGIST EVAL & MGMT  07/31/2020  ? IR REMOVAL OF PLURAL CATH W/CUFF  10/31/2020  ? ? ?There were no vitals filed for this visit. ? ? Subjective Assessment - 04/28/21 1058   ? ? Subjective I am still waiting on my sleeve.   ? Pertinent History breast cancer with mets, hepatic, osseous, pulmonary, and axillary nodal metastasis, axillary chest wall mets, biopsy 02/29/20, ER+ PR- HER2+, pt was receiving radiation in April 2022 and had to stop due to anxiety from fluid build up, pt had a drain placed to drain fluid from abdominal cavity from low protein, pt taking herceptin, drain has been out since June 26th, pt has had lymphedema in LUE since Jan 2021 when she was diagnosed   ? Patient Stated Goals to get arm swelling down   ? Currently in Pain? No/denies   ? Pain Score 0-No pain   ? ?  ?  ? ?  ? ? ? ? ? ? ? ? ? ? ? ? ? ? ? ? ? ? ? ? [place] Adult PT Treatment/Exercise - 04/28/21 0001   ? ?  ? Manual Therapy  ? Manual Lymphatic Drainage (MLD) in supine : short neck, 5 diaphragmatic breaths, right axillary nodes and establishment of interaxillary  pathway, left inguinal nodes and establishment of axillo inguinal pathway, LUE working proximal to distal then retracing all steps   ? Compression Bandaging thick stockinette from hand to axilla, artiflex from wrist to axilla with small TG soft placed at thumb webspace where redness present, then 1 6 cm bandage to hand and halfway up forearm, 1 8 cm wrist to axilla, 1 10cm from wrist to axilla,   ? ?  ?  ? ?  ? ? ? ? ? ? ? ? ? ? ? ? ? ? ? PT Long Term Goals - 03/18/21 1307   ? ?  ? PT LONG TERM GOAL #1  ? Title Pt will demonstrate a 5 cm decrease in edema 15 cm proximal to ulnar styloid process to decrease risk of infection.   ? Baseline 29 cm; 03/02/21- 25.5, 03/09/2021: 23   ? Time 6   ? Period Weeks   ? Status Achieved   ?  ? PT LONG TERM GOAL #2  ? Title Pt will receive trial of FLexiTouch compression pump for long term management of lymphedema   ? Baseline 03/02/21- demographics sent; 03/18/21- pt has recieved her FlexiTouch compression  pump   ? Time 6   ? Period Weeks   ? Status Achieved   ?  ? PT LONG TERM GOAL #3  ? Title Pt will be independent in self MLD for long term management of lymphedema.   ? Time 6   ? Period Weeks   ? Status On-going   ?  ? PT LONG TERM GOAL #4  ? Title Pt will receive an appropriate compression garment for long term management of lymphedema.   ? Baseline 03/02/21- pt is still demonstrating reduction; 03/18/21-pt is awaiting arrival of her compression garments   ? Time 6   ? Period Weeks   ? Status On-going   ?  ? PT LONG TERM GOAL #5  ? Title Pt will demonstrate 120 degrees of L shoulder abduction to allow pt to reach out to the side.   ? Baseline 64; 03/18/21- 105   ? Time 6   ? Period Weeks   ? Status On-going   ?  ? PT LONG TERM GOAL #6  ? Title Pt will be independent in a home exercise program for continued stretching and strengthening.   ? Time 6   ? Period Weeks   ? Status On-going   ? ?  ?  ? ?  ? ? ? ? ? ? ? ? Plan - 04/28/21 1058   ? ? Clinical Impression Statement Pt is still  waiting on arrival of remade custom sleeve. Continued with MLD and compression bandaging until pt receives re made sleeve and glove. Added less layers to hand to decrease discomfort at fingers and in joints of hand.   ? PT Frequency 3x / week   ? PT Duration 4 weeks   ? PT Treatment/Interventions ADLs/Self Care Home Management;Patient/family education;Therapeutic exercise;Manual techniques;Manual lymph drainage;Compression bandaging;Orthotic Fit/Training;Passive range of motion;Vasopneumatic Device   ? PT Next Visit Plan continue CDT until pt gets new sleeve, PROM, AAROM left shoulder   ? Consulted and Agree with Plan of Care Patient   ? ?  ?  ? ?  ? ? ?Patient will benefit from skilled therapeutic intervention in order to improve the following deficits and impairments:  Postural dysfunction, Impaired UE functional use, Increased fascial restricitons, Decreased strength, Decreased range of motion, Increased edema ? ?Visit Diagnosis: ?Lymphedema, not elsewhere classified ? ?Stiffness of left shoulder, not elsewhere classified ? ?Malignant neoplasm of left breast in female, estrogen receptor positive, unspecified site of breast ([place]) ? ? ? ? ?Problem List ?Patient Active Problem List  ? Diagnosis Date Noted  ? SOB (shortness of breath) 01/22/2021  ? HTN (hypertension) 11/11/2020  ? Hospice care patient 06/05/2020  ? Acute hypoxemic respiratory failure ([place]) 05/28/2020  ? Lymphedema 05/27/2020  ? Bilateral lower extremity edema 05/27/2020  ? Malignant pleural effusion 05/27/2020  ? Anxiety state 05/21/2020  ? Carcinoma of breast metastatic to bone ([place]) 03/19/2020  ? Protein-calorie malnutrition, severe 03/04/2020  ? Pressure injury of skin 02/28/2020  ? Bone metastases ([place]) 02/27/2020  ? Elevated LFTs 02/27/2020  ? Elevated troponin 02/27/2020  ? Pleural effusion 02/27/2020  ? Breast mass, left 02/27/2020  ? Sinus tachycardia 02/27/2020  ? ? ?[name] [place], PT ?04/28/2021, 11:00 AM ? ?[place] ?[facility] [place] ?[place], [place], [address] ?Phone: [phone]   Fax:  [phone] ? ?Name: CHERYLANN HOBDAY ?MRN: [id number] ?Date of Birth: 06-Jan-1941 ? ? ? ?

## 2021-04-30 ENCOUNTER — Inpatient Hospital Stay: Payer: Medicare HMO | Admitting: Hematology and Oncology

## 2021-04-30 ENCOUNTER — Other Ambulatory Visit: Payer: Self-pay

## 2021-04-30 ENCOUNTER — Inpatient Hospital Stay: Payer: Medicare HMO

## 2021-04-30 ENCOUNTER — Encounter: Payer: Self-pay | Admitting: Hematology and Oncology

## 2021-04-30 VITALS — BP 181/64 | HR 73 | Temp 97.9°F | Resp 16 | Ht 61.0 in | Wt 126.1 lb

## 2021-04-30 VITALS — BP 172/74 | HR 81 | Temp 98.2°F | Resp 16

## 2021-04-30 DIAGNOSIS — I890 Lymphedema, not elsewhere classified: Secondary | ICD-10-CM | POA: Diagnosis not present

## 2021-04-30 DIAGNOSIS — C50919 Malignant neoplasm of unspecified site of unspecified female breast: Secondary | ICD-10-CM

## 2021-04-30 DIAGNOSIS — C50412 Malignant neoplasm of upper-outer quadrant of left female breast: Secondary | ICD-10-CM | POA: Diagnosis not present

## 2021-04-30 DIAGNOSIS — C7951 Secondary malignant neoplasm of bone: Secondary | ICD-10-CM | POA: Diagnosis not present

## 2021-04-30 DIAGNOSIS — Z79899 Other long term (current) drug therapy: Secondary | ICD-10-CM | POA: Diagnosis not present

## 2021-04-30 DIAGNOSIS — Z5111 Encounter for antineoplastic chemotherapy: Secondary | ICD-10-CM | POA: Diagnosis not present

## 2021-04-30 DIAGNOSIS — Z170 Estrogen receptor positive status [ER+]: Secondary | ICD-10-CM | POA: Diagnosis not present

## 2021-04-30 DIAGNOSIS — C50912 Malignant neoplasm of unspecified site of left female breast: Secondary | ICD-10-CM

## 2021-04-30 DIAGNOSIS — F411 Generalized anxiety disorder: Secondary | ICD-10-CM

## 2021-04-30 DIAGNOSIS — Z923 Personal history of irradiation: Secondary | ICD-10-CM | POA: Diagnosis not present

## 2021-04-30 LAB — BASIC METABOLIC PANEL - CANCER CENTER ONLY
Anion gap: 9 (ref 5–15)
BUN: 11 mg/dL (ref 8–23)
CO2: 26 mmol/L (ref 22–32)
Calcium: 9.5 mg/dL (ref 8.9–10.3)
Chloride: 103 mmol/L (ref 98–111)
Creatinine: 0.7 mg/dL (ref 0.44–1.00)
GFR, Estimated: >60 mL/min (ref >60)
Glucose, Bld: 177 mg/dL — ABNORMAL HIGH (ref 70–99)
Potassium: 4.2 mmol/L (ref 3.5–5.1)
Sodium: 138 mmol/L (ref 135–145)

## 2021-04-30 MED ORDER — SODIUM CHLORIDE 0.9 % IV SOLN: Freq: Once | INTRAVENOUS | Status: AC

## 2021-04-30 MED ORDER — TRASTUZUMAB-ANNS CHEMO 150 MG IV SOLR
6.0000 mg/kg | Freq: Once | INTRAVENOUS | Status: AC
  Administered 2021-04-30: 336 mg via INTRAVENOUS
  Filled 2021-04-30: qty 16

## 2021-04-30 NOTE — Progress Notes (Signed)
Per Dr. Chryl Heck- the patient is not getting zometa today. ?

## 2021-04-30 NOTE — Patient Instructions (Signed)
[facility] MEDICAL [facility]COLOG[facility]  Discharge Instructions: ?Thank you for choosing [facility] to provide your oncology and hematology care.  ? ?If you have a lab appointment with the Cancer Center, please go directly to the Cancer Center and check in at the registration area. ?  ?Wear comfortable clothing and clothing appropriate for easy access to any Portacath or PICC line.  ? ?We strive to give you quality time with your provider. You may need to reschedule your appointment if you arrive late (15 or more minutes).  Arriving late affects you and other patients whose appointments are after yours.  Also, if you miss three or more appointments without notifying the office, you may be dismissed from the clinic at the provider?s discretion.    ?  ?For prescription refill requests, have your pharmacy contact our office and allow 72 hours for refills to be completed.   ? ?Today you received the following chemotherapy and/or immunotherapy agents: Trastuzumab    ?  ?To help prevent nausea and vomiting after your treatment, we encourage you to take your nausea medication as directed. ? ?BELOW ARE SYMPTOMS THAT SHOULD BE REPORTED IMMEDIATELY: ?*FEVER GREATER THAN 100.4 F (38 ?C) OR HIGHER ?*CHILLS OR SWEATING ?*NAUSEA AND VOMITING THAT IS NOT CONTROLLED WITH YOUR NAUSEA MEDICATION ?*UNUSUAL SHORTNESS OF BREATH ?*UNUSUAL BRUISING OR BLEEDING ?*URINARY PROBLEMS (pain or burning when urinating, or frequent urination) ?*BOWEL PROBLEMS (unusual diarrhea, constipation, pain near the anus) ?TENDERNESS IN MOUTH AND THROAT WITH OR WITHOUT PRESENCE OF ULCERS (sore throat, sores in mouth, or a toothache) ?UNUSUAL RASH, SWELLING OR PAIN  ?UNUSUAL VAGINAL DISCHARGE OR ITCHING  ? ?Items with * indicate a potential emergency and should be followed up as soon as possible or go to the Emergency Department if any problems should occur. ? ?Please show the CHEMOTHERAPY ALERT CARD or IMMUNOTHERAPY ALERT CARD at check-in  to the Emergency Department and triage nurse. ? ?Should you have questions after your visit or need to cancel or reschedule your appointment, please contact [facility]  Dept: [phone]  and follow the prompts.  Office hours are 8:00 a.m. to 4:30 p.m. Monday - Friday. Please note that voicemails left after 4:00 p.m. may not be returned until the following business day.  We are closed weekends and major holidays. You have access to a nurse at all times for urgent questions. Please call the main number to the clinic Dept: [phone] and follow the prompts. ? ? ?For any non-urgent questions, you may also contact your provider using MyChart. We now offer e-Visits for anyone 18 and older to request care online for non-urgent symptoms. For details visit [email or web address]. ?  ?Also download the MyChart app! Go to the app store, search "MyChart", open the app, select , and log in with your MyChart username and password. ? ?Due to Covid, a mask is required upon entering the hospital/clinic. If you do not have a mask, one will be given to you upon arrival. For doctor visits, patients may have 1 support person aged 18 or older with them. For treatment visits, patients cannot have anyone with them due to current Covid guidelines and our immunocompromised population.  ? ?

## 2021-04-30 NOTE — Progress Notes (Signed)
?Morgan Yu   ?Telephone:(336) [phone] [phone]   ?Clinic Follow up Note  ? ?Patient Care Team: ?Pcp, No as PCP - General ?Morgan Pike, MD as Consulting Physician (Hematology and Oncology) ?04/30/2021 ? ?CHIEF COMPLAINT: Stage IV left breast cancer, ER+ / PR- / Her2+, grade 3 ? ?SUMMARY OF ONCOLOGIC HISTORY: ?Oncology History  ?Bone metastases ([place])  ?02/27/2020 Initial Diagnosis  ? Bone metastases ([place]) ?  ?Carcinoma of breast metastatic to bone [facility])  ?03/19/2020 Initial Diagnosis  ? Carcinoma of breast metastatic to bone [facility]) ?  ?03/24/2020 -  Chemotherapy  ? Patient is on Treatment Plan : BREAST Trastuzumab q21d  ?   ?04/15/2020 Cancer Staging  ? Staging form: Breast, AJCC 8th Edition ?- Clinical stage from 04/15/2020: Stage IV (cTX, cNX, cM1, GX, ER+, PR-, HER2+) - Signed by Morgan Pike, MD on 04/15/2020 ?Stage prefix: Initial diagnosis ?Histologic grading system: 3 grade system ? ?  ? ? ?CURRENT THERAPY:  ?Anastrozole 1 mg p.o. daily ?Herceptin/Kanjinti q3 weeks ?Zometa q3 months ? ?INTERVAL HISTORY:  ? ?Morgan Yu returns for follow-up. ?She has been doing remarkably well. ?She is working at [employer], cooking and driving. ?No change in breathing. ?Left arm lymphedema is significantly better. ?No change in bowel or urinary habits. ?BP high today, she says she is mostly because she is stressed. ?No complaints, she is very happy with her QOL. ?She has a follow-up with cardiology next month. ?Rest of the pertinent 10 point ROS reviewed and negative. ? ? ?MEDICAL HISTORY:  ?Past Medical History:  ?Diagnosis Date  ? Breast cancer ([place])   ? with bone metastasis  ? ? ?SURGICAL HISTORY: ?Past Surgical History:  ?Procedure Laterality Date  ? IR PERC PLEURAL DRAIN W/INDWELL CATH W/IMG GUIDE  06/03/2020  ? IR RADIOLOGIST EVAL & MGMT  07/31/2020  ? IR REMOVAL OF PLURAL CATH W/CUFF  10/31/2020  ? ? ?I have reviewed the social history and family history with the patient and they are unchanged from  previous note. ? ?ALLERGIES:  is allergic to vicodin hp [hydrocodone-acetaminophen]. ? ?MEDICATIONS:  ?Current Outpatient Medications  ?Medication Sig Dispense Refill  ? anastrozole (ARIMIDEX) 1 MG tablet Take 1 tablet (1 mg total) by mouth daily. 30 tablet 11  ? carvedilol (COREG) 6.25 MG tablet Take 1 tablet (6.25 mg total) by mouth 2 (two) times daily. 180 tablet 3  ? Chlorphen-Pseudoephed-APAP (TYLENOL ALLERGY SINUS PO) Take 1 tablet by mouth daily as needed (allergy).    ? furosemide (LASIX) 20 MG tablet Take 1 tablet (20 mg total) by mouth daily. (Patient not taking: Reported on 03/26/2021) 30 tablet 0  ? oxymetazoline (AFRIN) 0.05 % nasal spray Place 1 spray into both nostrils daily as needed for congestion.    ? sacubitril-valsartan (ENTRESTO) 49-51 MG Take 1 tablet by mouth 2 (two) times daily. 180 tablet 3  ? ?No current facility-administered medications for this visit.  ? ?Review of Systems  ?Constitutional:  Negative for chills, fever, malaise/fatigue and weight loss.  ?HENT:  Negative for sore throat.   ?Respiratory:  Negative for cough, shortness of breath and wheezing.   ?Cardiovascular:  Negative for chest pain, palpitations and leg swelling.  ?Gastrointestinal:  Negative for abdominal pain, blood in stool, constipation, diarrhea, melena, nausea and vomiting.  ?Musculoskeletal:  Negative for myalgias.  ?Skin:  Negative for itching and rash.  ? ? ?PHYSICAL EXAMINATION: ?ECOG PERFORMANCE STATUS: 0 - Asymptomatic ? ?Vitals:  ? 04/30/21 1342  ?BP: (!) 181/64  ?Pulse: 73  ?Resp:  16  ?Temp: 97.9 ?F (36.6 ?C)  ?SpO2: 97%  ? ?Filed Weights  ? 04/30/21 1342  ?Weight: 126 lb 1.6 oz (57.2 kg)  ? ?Physical Exam ?Constitutional:   ?   Appearance: Normal appearance.  ?HENT:  ?   Head: Normocephalic and atraumatic.  ?Cardiovascular:  ?   Rate and Rhythm: Normal rate and regular rhythm.  ?   Pulses: Normal pulses.  ?   Heart sounds: Normal heart sounds.  ?Pulmonary:  ?   Effort: Pulmonary effort is normal.  ?    Breath sounds: Normal breath sounds.  ?Chest:  ?   Comments: Since last visit left breast changes have been stable.  Significant improvement in left axillary lymphadenopathy ?Abdominal:  ?   General: Abdomen is flat. Bowel sounds are normal.  ?Musculoskeletal:     ?   General: No swelling (LUE in Ace wrap, patient says swelling has decreased remarkably) or tenderness.  ?Skin: ?   General: Skin is warm and dry.  ?Neurological:  ?   General: No focal deficit present.  ?   Mental Status: She is alert.  ? ? ? ?LABORATORY DATA:  ?I have reviewed the data as listed ? ?  Latest Ref Rng & Units 04/09/2021  ?  1:26 PM 02/18/2021  ? 10:12 AM 01/21/2021  ? 12:01 PM  ?CBC  ?WBC 4.0 - 10.5 K/uL 7.2   7.3   5.6    ?Hemoglobin 12.0 - 15.0 g/dL 11.4   12.7   12.2    ?Hematocrit 36.0 - 46.0 % 33.2   37.7   37.4    ?Platelets 150 - 400 K/uL 252   203   200    ? ? ? ? ?  Latest Ref Rng & Units 04/30/2021  ?  1:06 PM 04/09/2021  ?  1:26 PM 03/27/2021  ? 10:45 AM  ?CMP  ?Glucose 70 - 99 mg/dL 177   243   135    ?BUN 8 - 23 mg/dL 11   20   19     ?Creatinine 0.44 - 1.00 mg/dL 0.70   1.03   0.91    ?Sodium 135 - 145 mmol/L 138   139   139    ?Potassium 3.5 - 5.1 mmol/L 4.2   4.5   4.3    ?Chloride 98 - 111 mmol/L 103   104   104    ?CO2 22 - 32 mmol/L 26   26   27     ?Calcium 8.9 - 10.3 mg/dL 9.5   9.9   10.1    ?Total Protein 6.5 - 8.1 g/dL  7.1     ?Total Bilirubin 0.3 - 1.2 mg/dL  0.4     ?Alkaline Phos 38 - 126 U/L  109     ?AST 15 - 41 U/L  29     ?ALT 0 - 44 U/L  17     ? ?Most recent echocardiogram in December 2022 showed left ventricular ejection fraction of 35 to 40% this is significantly different compared to the ejection fraction noticed before in September 2022.  Left ventricle demonstrates global hypokinesis.  Grade 1 diastolic dysfunction noted.  Right ventricular systolic function is normal.  No other major valvular abnormality noted. ?Echo in September 2022 showed ejection fraction of 60 to 65%, normal left ventricular  function. ? ? ? ?RADIOGRAPHIC STUDIES: ?I have personally reviewed the radiological images as listed and agreed with the findings in the report. ?No results found.  ? ?  ASSESSMENT & PLAN: 81 year old female ? ?# Metastatic breast cancer  ?-CT evidence of hepatic, osseous, pulmonary, and axillary nodal metastatic disease  ?-Biopsy 02/29/2020 left axillary/chest wall mass confirmed metastatic carcinoma consistent with breast primary, ER +50% weak, PR negative, HER2 positive 3+.  She is currently on trastuzumab every 21 days started in February 2022, anastrozole 1 mg p.o. daily and Zometa for bone strengthening. ?-Received palliative radiation to the left axilla from 05/28/2020-06/25/2020 ?-Last echocardiogram showed an ejection fraction drop of almost 20 to 25%.  We held trastuzumab and referred her to cardiology for further recommendations. Dr. Haroldine Laws has started her on carvedilol and Entresto for cardioprotection.   ?-I have discussed with cardiology and they are okay with [place] resuming Herceptin.  Clinically since she started Herceptin she has been doing quite well. ?She has a follow-up with cardiology coming up next echo planned in end of May followed by appointment with cardiologist. ?Clinically she continues to do remarkably well hence we will proceed with Herceptin and anastrozole. ? ?# bone health with regards to Zometa, this will be held in anticipation of dental procedures.  We have clearly discussed that although it has been more than 90 days since her last dose of Zometa, she is still at risk of osteonecrosis of the jaw.  We cannot give her written clearance for this procedure.  If this is deemed to be important, she has to assume the risks and she has to proceed with the procedure.  She expressed understanding of everything. ? ?#LUE Lymphedema: with significant improvement. ? ?All questions were answered. The patient knows to call the clinic with any problems, questions or concerns. No barriers to learning  were detected. ? ?I have spent a total of 30 minutes minutes of face-to-face and non-face-to-face time, preparing to see the patient, performing a medically appropriate examination, counseling and educating the patient,

## 2021-05-01 ENCOUNTER — Telehealth: Payer: Self-pay

## 2021-05-01 ENCOUNTER — Encounter: Payer: Self-pay | Admitting: Genetic Counselor

## 2021-05-01 ENCOUNTER — Encounter: Payer: Self-pay | Admitting: Hematology and Oncology

## 2021-05-01 NOTE — Telephone Encounter (Signed)
Received call from [facility] requesting medical clearance for dental work. Provided our office fax number for MD to review.  ?

## 2021-05-04 ENCOUNTER — Telehealth: Payer: Self-pay | Admitting: Hematology and Oncology

## 2021-05-04 NOTE — Telephone Encounter (Signed)
.  Called pt per 4/3 inbasket , Patient was unavailable, a message with appt time and date was left with number on file.   ?

## 2021-05-05 ENCOUNTER — Encounter: Payer: Self-pay | Admitting: Rehabilitation

## 2021-05-05 ENCOUNTER — Ambulatory Visit: Payer: Medicare HMO | Attending: Hematology and Oncology | Admitting: Rehabilitation

## 2021-05-05 DIAGNOSIS — C50912 Malignant neoplasm of unspecified site of left female breast: Secondary | ICD-10-CM | POA: Diagnosis not present

## 2021-05-05 DIAGNOSIS — I890 Lymphedema, not elsewhere classified: Secondary | ICD-10-CM | POA: Diagnosis not present

## 2021-05-05 DIAGNOSIS — Z170 Estrogen receptor positive status [ER+]: Secondary | ICD-10-CM | POA: Insufficient documentation

## 2021-05-05 DIAGNOSIS — M25612 Stiffness of left shoulder, not elsewhere classified: Secondary | ICD-10-CM

## 2021-05-05 NOTE — Therapy (Signed)
[facility] ?[facility] [place] ?[place], [place], [address] ?Phone: [phone]   Fax:  [phone] ? ?Physical Therapy Treatment ? ?Patient Details  ?Name: Morgan Yu ?MRN: [id number] ?Date of Birth: 11-24-1940 ?Referring Provider (PT): Iruku ? ? ?Encounter Date: 05/05/2021 ? ? PT End of Session - 05/05/21 1556   ? ? Visit Number 31   ? Number of Visits 35   ? Date for PT Re-Evaluation 05/14/21   ? PT Start Time 1500   ? PT Stop Time 1550   ? PT Time Calculation (min) 50 min   ? Activity Tolerance Patient tolerated treatment well   ? Behavior During Therapy [facility] for tasks assessed/performed   ? ?  ?  ? ?  ? ? ?Past Medical History:  ?Diagnosis Date  ? Breast cancer ([place])   ? with bone metastasis  ? ? ?Past Surgical History:  ?Procedure Laterality Date  ? IR PERC PLEURAL DRAIN W/INDWELL CATH W/IMG GUIDE  06/03/2020  ? IR RADIOLOGIST EVAL & MGMT  07/31/2020  ? IR REMOVAL OF PLURAL CATH W/CUFF  10/31/2020  ? ? ?There were no vitals filed for this visit. ? ? Subjective Assessment - 05/05/21 1500   ? ? Subjective Bandages have been off since Saturday afteroon.   ? Pertinent History breast cancer with mets, hepatic, osseous, pulmonary, and axillary nodal metastasis, axillary chest wall mets, biopsy 02/29/20, ER+ PR- HER2+, pt was receiving radiation in April 2022 and had to stop due to anxiety from fluid build up, pt had a drain placed to drain fluid from abdominal cavity from low protein, pt taking herceptin, drain has been out since June 26th, pt has had lymphedema in LUE since Jan 2021 when she was diagnosed   ? Currently in Pain? No/denies   ? ?  ?  ? ?  ? ? ? ? ? ? ? ? ? ? ? ? ? ? ? ? ? ? ? ? [place] Adult PT Treatment/Exercise - 05/05/21 0001   ? ?  ? Manual Therapy  ? Manual therapy comments pt declined small tg soft as this did not stay in place   ? Manual Lymphatic Drainage (MLD) in supine : short neck, 5 diaphragmatic breaths, right axillary nodes and establishment of  interaxillary pathway, left inguinal nodes and establishment of axillo inguinal pathway, LUE working proximal to distal then retracing all steps with some focus on pocket of edema near elbow at lateral forearm   ? Compression Bandaging thick stockinette from hand to axilla, artiflex from wrist to axilla, then 1 6 cm bandage to hand and halfway up forearm, 1 8 cm wrist to axilla, 1 10cm from wrist to axilla,   ? ?  ?  ? ?  ? ? ? ? ? ? ? ? ? ? ? ? ? ? ? PT Long Term Goals - 03/18/21 1307   ? ?  ? PT LONG TERM GOAL #1  ? Title Pt will demonstrate a 5 cm decrease in edema 15 cm proximal to ulnar styloid process to decrease risk of infection.   ? Baseline 29 cm; 03/02/21- 25.5, 03/09/2021: 23   ? Time 6   ? Period Weeks   ? Status Achieved   ?  ? PT LONG TERM GOAL #2  ? Title Pt will receive trial of FLexiTouch compression pump for long term management of lymphedema   ? Baseline 03/02/21- demographics sent; 03/18/21- pt has recieved her FlexiTouch compression pump   ?  Time 6   ? Period Weeks   ? Status Achieved   ?  ? PT LONG TERM GOAL #3  ? Title Pt will be independent in self MLD for long term management of lymphedema.   ? Time 6   ? Period Weeks   ? Status On-going   ?  ? PT LONG TERM GOAL #4  ? Title Pt will receive an appropriate compression garment for long term management of lymphedema.   ? Baseline 03/02/21- pt is still demonstrating reduction; 03/18/21-pt is awaiting arrival of her compression garments   ? Time 6   ? Period Weeks   ? Status On-going   ?  ? PT LONG TERM GOAL #5  ? Title Pt will demonstrate 120 degrees of L shoulder abduction to allow pt to reach out to the side.   ? Baseline 64; 03/18/21- 105   ? Time 6   ? Period Weeks   ? Status On-going   ?  ? PT LONG TERM GOAL #6  ? Title Pt will be independent in a home exercise program for continued stretching and strengthening.   ? Time 6   ? Period Weeks   ? Status On-going   ? ?  ?  ? ?  ? ? ? ? ? ? ? ? Plan - 05/05/21 1556   ? ? Clinical Impression Statement Pt  is still waiting on remake of custom sleeve. Continued MLD and compression bandaging.   ? PT Frequency 3x / week   ? PT Duration 4 weeks   ? PT Treatment/Interventions ADLs/Self Care Home Management;Patient/family education;Therapeutic exercise;Manual techniques;Manual lymph drainage;Compression bandaging;Orthotic Fit/Training;Passive range of motion;Vasopneumatic Device   ? PT Next Visit Plan continue CDT until pt gets new sleeve, PROM, AAROM left shoulder   ? Consulted and Agree with Plan of Care Patient   ? ?  ?  ? ?  ? ? ?Patient will benefit from skilled therapeutic intervention in order to improve the following deficits and impairments:    ? ?Visit Diagnosis: ?Lymphedema, not elsewhere classified ? ?Stiffness of left shoulder, not elsewhere classified ? ?Malignant neoplasm of left breast in female, estrogen receptor positive, unspecified site of breast ([place]) ? ? ? ? ?Problem List ?Patient Active Problem List  ? Diagnosis Date Noted  ? SOB (shortness of breath) 01/22/2021  ? HTN (hypertension) 11/11/2020  ? Hospice care patient 06/05/2020  ? Acute hypoxemic respiratory failure ([place]) 05/28/2020  ? Lymphedema 05/27/2020  ? Bilateral lower extremity edema 05/27/2020  ? Malignant pleural effusion 05/27/2020  ? Anxiety state 05/21/2020  ? Carcinoma of breast metastatic to bone ([place]) 03/19/2020  ? Protein-calorie malnutrition, severe 03/04/2020  ? Pressure injury of skin 02/28/2020  ? Bone metastases 02/27/2020  ? Elevated LFTs 02/27/2020  ? Elevated troponin 02/27/2020  ? Pleural effusion 02/27/2020  ? Breast mass, left 02/27/2020  ? Sinus tachycardia 02/27/2020  ? ? ?Stark Bray, PT ?05/05/2021, 3:57 PM ? ?[facility] [place] ?[facility] [place] ?[place], [place], [address] ?Phone: [phone]   Fax:  [phone] ? ?Name: Morgan Yu ?MRN: [id number] ?Date of Birth: 08-14-1940 ? ? ? ?

## 2021-05-07 ENCOUNTER — Ambulatory Visit: Payer: Medicare HMO

## 2021-05-07 DIAGNOSIS — I890 Lymphedema, not elsewhere classified: Secondary | ICD-10-CM

## 2021-05-07 DIAGNOSIS — M25612 Stiffness of left shoulder, not elsewhere classified: Secondary | ICD-10-CM | POA: Diagnosis not present

## 2021-05-07 DIAGNOSIS — Z170 Estrogen receptor positive status [ER+]: Secondary | ICD-10-CM | POA: Diagnosis not present

## 2021-05-07 DIAGNOSIS — C50912 Malignant neoplasm of unspecified site of left female breast: Secondary | ICD-10-CM | POA: Diagnosis not present

## 2021-05-07 NOTE — Therapy (Signed)
[facility] [place] ?[place], [place], [address] ?Phone: [phone]   Fax:  [phone] ? ?Physical Therapy Treatment ? ?Patient Details  ?Name: Morgan Yu ?MRN: [id number] ?Date of Birth: 1940-06-11 ?Referring Provider (PT): Iruku ? ? ?Encounter Date: 05/07/2021 ? ? PT End of Session - 05/07/21 1102   ? ? Visit Number 32   ? Number of Visits 35   ? Date for PT Re-Evaluation 05/14/21   ? PT Start Time 1006   ? PT Stop Time 1101   ? PT Time Calculation (min) 55 min   ? Behavior During Therapy [facility] for tasks assessed/performed   ? ?  ?  ? ?  ? ? ?Past Medical History:  ?Diagnosis Date  ? Breast cancer ([place])   ? with bone metastasis  ? ? ?Past Surgical History:  ?Procedure Laterality Date  ? IR PERC PLEURAL DRAIN W/INDWELL CATH W/IMG GUIDE  06/03/2020  ? IR RADIOLOGIST EVAL & MGMT  07/31/2020  ? IR REMOVAL OF PLURAL CATH W/CUFF  10/31/2020  ? ? ?There were no vitals filed for this visit. ? ? ? ? ? ? ? ? ? LYMPHEDEMA/ONCOLOGY QUESTIONNAIRE - 05/07/21 0001   ? ?  ? Left Upper Extremity Lymphedema  ? 10 cm Proximal to Olecranon Process 25 cm   ? Olecranon Process 23.6 cm   ? 15 cm Proximal to Ulnar Styloid Process 23 cm   ? 10 cm Proximal to Ulnar Styloid Process 21.6 cm   ? Just Proximal to Ulnar Styloid Process 14.7 cm   ? At [facility] of 2nd Digit 5.5 cm   ? ?  ?  ? ?  ? ? ? ? ? ? ? ? ? ? ? ? ? [place] Adult PT Treatment/Exercise - 05/07/21 0001   ? ?  ? Manual Therapy  ? Manual Therapy Manual Lymphatic Drainage (MLD);Compression Bandaging;Passive ROM   ? Manual Lymphatic Drainage (MLD) in supine : short neck, 5 diaphragmatic breaths, right axillary nodes and establishment of anterior interaxillary pathway, left inguinal nodes and establishment of Lt axillo inguinal pathway, LUE working proximal to distal then retracing all steps with some focus on pocket of edema near elbow at lateral forearm   ? Compression Bandaging thick stockinette from hand to axilla, artiflex  from wrist to axilla with 1/2"  gray foam added to dorsal hand where as pt has been reporting her MCP have been squeezed together, then 1 6 cm bandage to hand and halfway up forearm, 1 8 cm wrist to axilla, 1 10cm from wrist to axilla,   ? Passive ROM In supine briefly during MLD to Lt shoulder into flexion, abd and er to pts limited tolerance due to tightness   ? ?  ?  ? ?  ? ? ? ? ? ? ? ? ? ? ? ? ? ? ? PT Long Term Goals - 03/18/21 1307   ? ?  ? PT LONG TERM GOAL #1  ? Title Pt will demonstrate a 5 cm decrease in edema 15 cm proximal to ulnar styloid process to decrease risk of infection.   ? Baseline 29 cm; 03/02/21- 25.5, 03/09/2021: 23   ? Time 6   ? Period Weeks   ? Status Achieved   ?  ? PT LONG TERM GOAL #2  ? Title Pt will receive trial of FLexiTouch compression pump for long term management of lymphedema   ? Baseline 03/02/21- demographics sent; 03/18/21- pt has recieved  her FlexiTouch compression pump   ? Time 6   ? Period Weeks   ? Status Achieved   ?  ? PT LONG TERM GOAL #3  ? Title Pt will be independent in self MLD for long term management of lymphedema.   ? Time 6   ? Period Weeks   ? Status On-going   ?  ? PT LONG TERM GOAL #4  ? Title Pt will receive an appropriate compression garment for long term management of lymphedema.   ? Baseline 03/02/21- pt is still demonstrating reduction; 03/18/21-pt is awaiting arrival of her compression garments   ? Time 6   ? Period Weeks   ? Status On-going   ?  ? PT LONG TERM GOAL #5  ? Title Pt will demonstrate 120 degrees of L shoulder abduction to allow pt to reach out to the side.   ? Baseline 64; 03/18/21- 105   ? Time 6   ? Period Weeks   ? Status On-going   ?  ? PT LONG TERM GOAL #6  ? Title Pt will be independent in a home exercise program for continued stretching and strengthening.   ? Time 6   ? Period Weeks   ? Status On-going   ? ?  ?  ? ?  ? ? ? ? ? ? ? ? Plan - 05/07/21 1103   ? ? Clinical Impression Statement Pt is awaiting remake of new custom compression  sleeve and glove. Continued complete decongestive therapy to Lt UE and included P/ROM to Lt shoulder as she reports this has been feeling tight as she hasn't been able to use her UE easily with the bandages on. Added 1/2" gray foam to dorsal hand to help alleviate pressure she's been feeling in MCP from 6 cm bandage.   ? Examination-Activity Limitations Caring for Others;Reach Overhead;Lift   ? Stability/Clinical Decision Making Stable/Uncomplicated   ? Rehab Potential Good   ? PT Frequency 3x / week   ? PT Duration 4 weeks   ? PT Treatment/Interventions ADLs/Self Care Home Management;Patient/family education;Therapeutic exercise;Manual techniques;Manual lymph drainage;Compression bandaging;Orthotic Fit/Training;Passive range of motion;Vasopneumatic Device   ? PT Next Visit Plan continue CDT until pt gets new sleeve, PROM, AAROM left shoulder   ? Consulted and Agree with Plan of Care Patient   ? ?  ?  ? ?  ? ? ?Patient will benefit from skilled therapeutic intervention in order to improve the following deficits and impairments:  Postural dysfunction, Impaired UE functional use, Increased fascial restricitons, Decreased strength, Decreased range of motion, Increased edema ? ?Visit Diagnosis: ?Lymphedema, not elsewhere classified ? ?Stiffness of left shoulder, not elsewhere classified ? ?Malignant neoplasm of left breast in female, estrogen receptor positive, unspecified site of breast ([place]) ? ? ? ? ?Problem List ?Patient Active Problem List  ? Diagnosis Date Noted  ? SOB (shortness of breath) 01/22/2021  ? HTN (hypertension) 11/11/2020  ? Hospice care patient 06/05/2020  ? Acute hypoxemic respiratory failure ([place]) 05/28/2020  ? Lymphedema 05/27/2020  ? Bilateral lower extremity edema 05/27/2020  ? Malignant pleural effusion 05/27/2020  ? Anxiety state 05/21/2020  ? Carcinoma of breast metastatic to bone ([place]) 03/19/2020  ? Protein-calorie malnutrition, severe 03/04/2020  ? Pressure injury of skin 02/28/2020  ? Bone  metastases 02/27/2020  ? Elevated LFTs 02/27/2020  ? Elevated troponin 02/27/2020  ? Pleural effusion 02/27/2020  ? Breast mass, left 02/27/2020  ? Sinus tachycardia 02/27/2020  ? ? ?Otelia Limes, PTA ?05/07/2021, 11:06  AM ? ?[facility] [place] ?[place], [place], [address] ?Phone: [phone]   Fax:  [phone] ? ?Name: GERMANI GAVILANES ?MRN: [id number] ?Date of Birth: Sep 29, 1940 ? ? ? ?

## 2021-05-13 ENCOUNTER — Ambulatory Visit: Payer: Medicare HMO

## 2021-05-13 DIAGNOSIS — Z170 Estrogen receptor positive status [ER+]: Secondary | ICD-10-CM | POA: Diagnosis not present

## 2021-05-13 DIAGNOSIS — I890 Lymphedema, not elsewhere classified: Secondary | ICD-10-CM

## 2021-05-13 DIAGNOSIS — C50912 Malignant neoplasm of unspecified site of left female breast: Secondary | ICD-10-CM

## 2021-05-13 DIAGNOSIS — M25612 Stiffness of left shoulder, not elsewhere classified: Secondary | ICD-10-CM | POA: Diagnosis not present

## 2021-05-13 NOTE — Therapy (Addendum)
[facility] [place] ?[place], [place], [address] ?Phone: [phone]   Fax:  [phone] ? ?Physical Therapy Treatment ? ?Patient Details  ?Name: Morgan Yu ?MRN: [id number] ?Date of Birth: [birth year] ?Referring Provider (PT): Iruku ? ? ?Encounter Date: 05/13/2021 ? ? PT End of Session - 05/13/21 1159   ? ? Visit Number 33   ? Number of Visits 35   ? Date for PT Re-Evaluation 05/14/21   ? PT Start Time 1103   ? PT Stop Time 0867   ? PT Time Calculation (min) 56 min   ? Activity Tolerance Patient tolerated treatment well   ? Behavior During Therapy [facility] for tasks assessed/performed   ? ?  ?  ? ?  ? ? ?Past Medical History:  ?Diagnosis Date  ? Breast cancer ([place])   ? with bone metastasis  ? ? ?Past Surgical History:  ?Procedure Laterality Date  ? IR PERC PLEURAL DRAIN W/INDWELL CATH W/IMG GUIDE  06/03/2020  ? IR RADIOLOGIST EVAL & MGMT  07/31/2020  ? IR REMOVAL OF PLURAL CATH W/CUFF  10/31/2020  ? ? ?There were no vitals filed for this visit. ? ? ? ? ? ? [facility] PT Assessment - 05/13/21 0001   ? ?  ? AROM  ? Left Shoulder Flexion 108 Degrees   ? Left Shoulder ABduction 109 Degrees   ? ?  ?  ? ?  ? ? ? ? LYMPHEDEMA/ONCOLOGY QUESTIONNAIRE - 05/13/21 0001   ? ?  ? Left Upper Extremity Lymphedema  ? 10 cm Proximal to Olecranon Process 25.4 cm   ? Olecranon Process 23.6 cm   ? 15 cm Proximal to Ulnar Styloid Process 23.8 cm   ? 10 cm Proximal to Ulnar Styloid Process 21.8 cm   ? Just Proximal to Ulnar Styloid Process 15 cm   ? Across Hand at [employer] 16.6 cm   ? At [facility] of 2nd Digit 5.6 cm   ? ?  ?  ? ?  ? ? ? ? ? ? ? ? ? ? ? ? ? [place] Adult PT Treatment/Exercise - 05/13/21 0001   ? ?  ? Shoulder Exercises: Supine  ? Horizontal ABduction AAROM;Left;5 reps   with dowel  ? External Rotation AAROM;Left;5 reps   with dowel  ? External Rotation Limitations tactile cues to keep elbow adducted   ? Flexion AAROM;Both;5 reps   with dowel  ?  ? Shoulder Exercises: Pulleys   ? Flexion 2 minutes   ? Flexion Limitations Pt returned therapist demo   ? Scaption 1 minute   ?  ? Manual Therapy  ? Manual Therapy Manual Lymphatic Drainage (MLD);Compression Bandaging;Passive ROM   ? Manual Lymphatic Drainage (MLD) in supine : short neck, 5 diaphragmatic breaths, right axillary nodes and establishment of anterior interaxillary pathway, left inguinal nodes and establishment of Lt axillo inguinal pathway, LUE working proximal to distal then retracing all steps with some focus on pocket of edema near elbow at lateral forearm   ? Passive ROM In supine briefly during MLD to Lt shoulder into flexion, abd and er to pts limited tolerance due to tightness   ? ?  ?  ? ?  ? ? ? ? ? ? ? ? ? ? PT Education - 05/13/21 1200   ? ? Education Details Supine dowel exercises for Lt shoulder   ? Person(s) Educated Patient   ? Methods Explanation;Demonstration;Handout   ? Comprehension Verbalized understanding;Returned demonstration   ? ?  ?  ? ?  ? ? ? ? ? ?  PT Long Term Goals - 05/13/21 1203   ? ?  ? PT LONG TERM GOAL #3  ? Title Pt will be independent in self MLD for long term management of lymphedema.   ? Status Achieved   ?  ? PT LONG TERM GOAL #4  ? Title Pt will receive an appropriate compression garment for long term management of lymphedema.   ? Baseline 03/02/21- pt is still demonstrating reduction; 03/18/21-pt is awaiting arrival of her compression garments; pt got these 05/13/21   ? Status Achieved   ?  ? PT LONG TERM GOAL #5  ? Title Pt will demonstrate 120 degrees of L shoulder abduction to allow pt to reach out to the side.   ? Baseline 64; 03/18/21- 105; 109 0 05/13/21   ? Status Not Met   ?  ? PT LONG TERM GOAL #6  ? Title Pt will be independent in a home exercise program for continued stretching and strengthening.   ? Baseline Pt has HEP for stretching - 05/13/21   ? Status Partially Met   ? ?  ?  ? ?  ? ? ? ? ? ? ? ? Plan - 05/13/21 1201   ? ? Clinical Impression Statement Pt arrives having just picked  up her new compression sleeve and glove. It is an excellent fit and pt was instructed in proper donning and was able to return correct demo. She has met all goals, except for A/ROM of Lt shoulder but she has HEP to work on progressing this. Pt is ready for D/C at this time.   ? Examination-Activity Limitations Caring for Others;Reach Overhead;Lift   ? Stability/Clinical Decision Making Stable/Uncomplicated   ? Rehab Potential Good   ? PT Frequency 3x / week   ? PT Duration 4 weeks   ? PT Treatment/Interventions ADLs/Self Care Home Management;Patient/family education;Therapeutic exercise;Manual techniques;Manual lymph drainage;Compression bandaging;Orthotic Fit/Training;Passive range of motion;Vasopneumatic Device   ? PT Next Visit Plan D/C this visit   ? PT Home Exercise Plan Wear compression sleeve daily, use Flexitouch daily and nighttime garment each night; supine dowel exercises   ? Consulted and Agree with Plan of Care Patient   ? ?  ?  ? ?  ? ? ?Patient will benefit from skilled therapeutic intervention in order to improve the following deficits and impairments:  Postural dysfunction, Impaired UE functional use, Increased fascial restricitons, Decreased strength, Decreased range of motion, Increased edema ? ?Visit Diagnosis: ?Lymphedema, not elsewhere classified ? ?Stiffness of left shoulder, not elsewhere classified ? ?Malignant neoplasm of left breast in female, estrogen receptor positive, unspecified site of breast ([place]) ? ?PHYSICAL THERAPY DISCHARGE SUMMARY ? ?Visits from Start of Care: 33 ? ?Current functional level related to goals / functional outcomes: ?All goals met ?  ?Remaining deficits: ?Pt having some shoulder pain but would like to work on HEP  ?  ?Education / Equipment: ?HEP, compression garments, compression pump ? ?Patient agrees to discharge. Patient goals were met. Patient is being discharged due to meeting the stated rehab goals. ? ? ? ?Problem List ?Patient Active Problem List  ? Diagnosis  Date Noted  ? SOB (shortness of breath) 01/22/2021  ? HTN (hypertension) 11/11/2020  ? Hospice care patient 06/05/2020  ? Acute hypoxemic respiratory failure ([place]) 05/28/2020  ? Lymphedema 05/27/2020  ? Bilateral lower extremity edema 05/27/2020  ? Malignant pleural effusion 05/27/2020  ? Anxiety state 05/21/2020  ? Carcinoma of breast metastatic to bone ([place]) 03/19/2020  ? Protein-calorie malnutrition, severe 03/04/2020  ?  Pressure injury of skin 02/28/2020  ? Bone metastases 02/27/2020  ? Elevated LFTs 02/27/2020  ? Elevated troponin 02/27/2020  ? Pleural effusion 02/27/2020  ? Breast mass, left 02/27/2020  ? Sinus tachycardia 02/27/2020  ? ? ?Otelia Limes, PTA ?05/13/2021, 12:06 PM ? ?[facility] ?[facility] [place] ?[place], [place], [address] ?Phone: [phone]   Fax:  [phone] ? ?Name: Morgan Yu ?MRN: [id number] ?Date of Birth: 08/09/1940 ? ?[name] Baileyton, PT ?05/13/21 2:16 PM ? ? ?

## 2021-05-13 NOTE — Patient Instructions (Signed)
SHOULDER: Flexion - Supine (Cane)        Cancer Rehab [phone] ? ? ? ?Hold cane in both hands. Raise arms up overhead. Do not allow back to arch. Hold _5__ seconds. Do __5-10__ times; __1-2__ times a day. ? ? ?SELF ASSISTED WITH OBJECT: Shoulder Abduction / Adduction - Supine ? ? ? ?Hold cane with both hands. Move both arms from side to side, keep elbows straight.  Hold when stretch felt for __5__ seconds. Repeat __5-10__ times; __1-2__ times a day. Once this becomes easier progress to third picture bringing affected arm towards ear by staying out to side. Same hold for _5_seconds. Repeat  _5-10_ times, _1-2_ times/day. ? ?SHOULDER: External Rotation - Supine (Cane) ? ? ? ?Hold cane with both hands. Rotate arm away from body. Keep elbow on floor and next to body. _5-10__ reps per set, hold 5 seconds, _1-2__ sets per day. ?Add towel to keep elbow at side. ? ?Copyright ? VHI. All rights reserved.  ? ? ? ?  ?

## 2021-05-19 ENCOUNTER — Other Ambulatory Visit: Payer: Self-pay

## 2021-05-19 DIAGNOSIS — C50919 Malignant neoplasm of unspecified site of unspecified female breast: Secondary | ICD-10-CM

## 2021-05-21 ENCOUNTER — Inpatient Hospital Stay: Payer: Medicare HMO

## 2021-05-21 ENCOUNTER — Other Ambulatory Visit: Payer: Self-pay

## 2021-05-21 ENCOUNTER — Inpatient Hospital Stay: Payer: Medicare HMO | Attending: Hematology and Oncology

## 2021-05-21 VITALS — BP 159/68 | HR 78 | Temp 98.5°F | Resp 16 | Wt 125.8 lb

## 2021-05-21 DIAGNOSIS — C50919 Malignant neoplasm of unspecified site of unspecified female breast: Secondary | ICD-10-CM

## 2021-05-21 DIAGNOSIS — Z170 Estrogen receptor positive status [ER+]: Secondary | ICD-10-CM | POA: Diagnosis not present

## 2021-05-21 DIAGNOSIS — C7951 Secondary malignant neoplasm of bone: Secondary | ICD-10-CM | POA: Insufficient documentation

## 2021-05-21 DIAGNOSIS — F411 Generalized anxiety disorder: Secondary | ICD-10-CM

## 2021-05-21 DIAGNOSIS — C50912 Malignant neoplasm of unspecified site of left female breast: Secondary | ICD-10-CM | POA: Diagnosis not present

## 2021-05-21 DIAGNOSIS — Z5112 Encounter for antineoplastic immunotherapy: Secondary | ICD-10-CM | POA: Diagnosis not present

## 2021-05-21 LAB — CBC WITH DIFFERENTIAL (CANCER CENTER ONLY)
Abs Immature Granulocytes: 0.03 10*3/uL (ref 0.00–0.07)
Basophils Absolute: 0 10*3/uL (ref 0.0–0.1)
Basophils Relative: 0 %
Eosinophils Absolute: 0.1 10*3/uL (ref 0.0–0.5)
Eosinophils Relative: 1 %
HCT: 29.7 % — ABNORMAL LOW (ref 36.0–46.0)
Hemoglobin: 9.9 g/dL — ABNORMAL LOW (ref 12.0–15.0)
Immature Granulocytes: 0 %
Lymphocytes Relative: 16 %
Lymphs Abs: 1.4 10*3/uL (ref 0.7–4.0)
MCH: 25.9 pg — ABNORMAL LOW (ref 26.0–34.0)
MCHC: 33.3 g/dL (ref 30.0–36.0)
MCV: 77.7 fL — ABNORMAL LOW (ref 80.0–100.0)
Monocytes Absolute: 1.3 10*3/uL — ABNORMAL HIGH (ref 0.1–1.0)
Monocytes Relative: 15 %
Neutro Abs: 5.7 10*3/uL (ref 1.7–7.7)
Neutrophils Relative %: 68 %
Platelet Count: 304 10*3/uL (ref 150–400)
RBC: 3.82 MIL/uL — ABNORMAL LOW (ref 3.87–5.11)
RDW: 13.4 % (ref 11.5–15.5)
WBC Count: 8.5 10*3/uL (ref 4.0–10.5)
nRBC: 0 % (ref 0.0–0.2)

## 2021-05-21 LAB — CMP (CANCER CENTER ONLY)
ALT: 9 U/L (ref 0–44)
AST: 12 U/L — ABNORMAL LOW (ref 15–41)
Albumin: 3.4 g/dL — ABNORMAL LOW (ref 3.5–5.0)
Alkaline Phosphatase: 106 U/L (ref 38–126)
Anion gap: 6 (ref 5–15)
BUN: 14 mg/dL (ref 8–23)
CO2: 28 mmol/L (ref 22–32)
Calcium: 9.2 mg/dL (ref 8.9–10.3)
Chloride: 102 mmol/L (ref 98–111)
Creatinine: 0.91 mg/dL (ref 0.44–1.00)
GFR, Estimated: >60 mL/min (ref >60)
Glucose, Bld: 197 mg/dL — ABNORMAL HIGH (ref 70–99)
Potassium: 4.2 mmol/L (ref 3.5–5.1)
Sodium: 136 mmol/L (ref 135–145)
Total Bilirubin: 0.3 mg/dL (ref 0.3–1.2)
Total Protein: 7.3 g/dL (ref 6.5–8.1)

## 2021-05-21 MED ORDER — TRASTUZUMAB-ANNS CHEMO 150 MG IV SOLR
6.0000 mg/kg | Freq: Once | INTRAVENOUS | Status: AC
  Administered 2021-05-21: 336 mg via INTRAVENOUS
  Filled 2021-05-21: qty 16

## 2021-05-21 MED ORDER — SODIUM CHLORIDE 0.9 % IV SOLN: Freq: Once | INTRAVENOUS | Status: AC

## 2021-05-21 NOTE — Patient Instructions (Signed)
[facility]  Discharge Instructions: ?Thank you for choosing [facility] to provide your oncology and hematology care.  ? ?If you have a lab appointment with the Cancer Center, please go directly to the Cancer Center and check in at the registration area. ?  ?Wear comfortable clothing and clothing appropriate for easy access to any Portacath or PICC line.  ? ?We strive to give you quality time with your provider. You may need to reschedule your appointment if you arrive late (15 or more minutes).  Arriving late affects you and other patients whose appointments are after yours.  Also, if you miss three or more appointments without notifying the office, you may be dismissed from the clinic at the provider?s discretion.    ?  ?For prescription refill requests, have your pharmacy contact our office and allow 72 hours for refills to be completed.   ? ?Today you received the following chemotherapy and/or immunotherapy agents: Trastuzumab    ?  ?To help prevent nausea and vomiting after your treatment, we encourage you to take your nausea medication as directed. ? ?BELOW ARE SYMPTOMS THAT SHOULD BE REPORTED IMMEDIATELY: ?*FEVER GREATER THAN 100.4 F (38 ?C) OR HIGHER ?*CHILLS OR SWEATING ?*NAUSEA AND VOMITING THAT IS NOT CONTROLLED WITH YOUR NAUSEA MEDICATION ?*UNUSUAL SHORTNESS OF BREATH ?*UNUSUAL BRUISING OR BLEEDING ?*URINARY PROBLEMS (pain or burning when urinating, or frequent urination) ?*BOWEL PROBLEMS (unusual diarrhea, constipation, pain near the anus) ?TENDERNESS IN MOUTH AND THROAT WITH OR WITHOUT PRESENCE OF ULCERS (sore throat, sores in mouth, or a toothache) ?UNUSUAL RASH, SWELLING OR PAIN  ?UNUSUAL VAGINAL DISCHARGE OR ITCHING  ? ?Items with * indicate a potential emergency and should be followed up as soon as possible or go to the Emergency Department if any problems should occur. ? ?Please show the CHEMOTHERAPY ALERT CARD or IMMUNOTHERAPY ALERT CARD at check-in  to the Emergency Department and triage nurse. ? ?Should you have questions after your visit or need to cancel or reschedule your appointment, please contact [facility]  Dept: [phone]  and follow the prompts.  Office hours are 8:00 a.m. to 4:30 p.m. Monday - Friday. Please note that voicemails left after 4:00 p.m. may not be returned until the following business day.  We are closed weekends and major holidays. You have access to a nurse at all times for urgent questions. Please call the main number to the clinic Dept: [phone] and follow the prompts. ? ? ?For any non-urgent questions, you may also contact your provider using MyChart. We now offer e-Visits for anyone 18 and older to request care online for non-urgent symptoms. For details visit [email or web address]. ?  ?Also download the MyChart app! Go to the app store, search "MyChart", open the app, select [place] [place], and log in with your MyChart username and password. ? ?Due to Covid, a mask is required upon entering the hospital/clinic. If you do not have a mask, one will be given to you upon arrival. For doctor visits, patients may have 1 support person aged 18 or older with them. For treatment visits, patients cannot have anyone with them due to current Covid guidelines and our immunocompromised population.  ? ?

## 2021-05-21 NOTE — Progress Notes (Signed)
Per pt, unable to get paperwork signed by dentist for zometa clearance.   ?

## 2021-06-11 ENCOUNTER — Inpatient Hospital Stay: Payer: Medicare HMO | Admitting: Hematology and Oncology

## 2021-06-11 ENCOUNTER — Inpatient Hospital Stay: Payer: Medicare HMO | Attending: Hematology and Oncology

## 2021-06-11 ENCOUNTER — Other Ambulatory Visit: Payer: Self-pay

## 2021-06-11 ENCOUNTER — Inpatient Hospital Stay (HOSPITAL_BASED_OUTPATIENT_CLINIC_OR_DEPARTMENT_OTHER): Payer: Medicare HMO | Admitting: Genetic Counselor

## 2021-06-11 ENCOUNTER — Other Ambulatory Visit: Payer: Self-pay | Admitting: Genetic Counselor

## 2021-06-11 ENCOUNTER — Encounter: Payer: Self-pay | Admitting: Hematology and Oncology

## 2021-06-11 ENCOUNTER — Encounter: Payer: Self-pay | Admitting: Genetic Counselor

## 2021-06-11 ENCOUNTER — Inpatient Hospital Stay: Payer: Medicare HMO

## 2021-06-11 VITALS — BP 160/64 | HR 78 | Temp 97.9°F | Resp 16 | Wt 122.3 lb

## 2021-06-11 DIAGNOSIS — Z8051 Family history of malignant neoplasm of kidney: Secondary | ICD-10-CM

## 2021-06-11 DIAGNOSIS — C50919 Malignant neoplasm of unspecified site of unspecified female breast: Secondary | ICD-10-CM

## 2021-06-11 DIAGNOSIS — C7951 Secondary malignant neoplasm of bone: Secondary | ICD-10-CM | POA: Diagnosis not present

## 2021-06-11 DIAGNOSIS — C50412 Malignant neoplasm of upper-outer quadrant of left female breast: Secondary | ICD-10-CM | POA: Diagnosis not present

## 2021-06-11 DIAGNOSIS — C773 Secondary and unspecified malignant neoplasm of axilla and upper limb lymph nodes: Secondary | ICD-10-CM | POA: Diagnosis not present

## 2021-06-11 DIAGNOSIS — Z803 Family history of malignant neoplasm of breast: Secondary | ICD-10-CM | POA: Diagnosis not present

## 2021-06-11 DIAGNOSIS — C50912 Malignant neoplasm of unspecified site of left female breast: Secondary | ICD-10-CM

## 2021-06-11 DIAGNOSIS — Z170 Estrogen receptor positive status [ER+]: Secondary | ICD-10-CM | POA: Diagnosis not present

## 2021-06-11 DIAGNOSIS — Z5112 Encounter for antineoplastic immunotherapy: Secondary | ICD-10-CM | POA: Diagnosis not present

## 2021-06-11 DIAGNOSIS — Z8042 Family history of malignant neoplasm of prostate: Secondary | ICD-10-CM | POA: Diagnosis not present

## 2021-06-11 DIAGNOSIS — F411 Generalized anxiety disorder: Secondary | ICD-10-CM

## 2021-06-11 DIAGNOSIS — Z923 Personal history of irradiation: Secondary | ICD-10-CM | POA: Diagnosis not present

## 2021-06-11 HISTORY — DX: Family history of malignant neoplasm of kidney: Z80.51

## 2021-06-11 HISTORY — DX: Family history of malignant neoplasm of breast: Z80.3

## 2021-06-11 LAB — CBC WITH DIFFERENTIAL/PLATELET
Abs Immature Granulocytes: 0.05 10*3/uL (ref 0.00–0.07)
Basophils Absolute: 0 10*3/uL (ref 0.0–0.1)
Basophils Relative: 0 %
Eosinophils Absolute: 0.1 10*3/uL (ref 0.0–0.5)
Eosinophils Relative: 1 %
HCT: 30.3 % — ABNORMAL LOW (ref 36.0–46.0)
Hemoglobin: 9.8 g/dL — ABNORMAL LOW (ref 12.0–15.0)
Immature Granulocytes: 1 %
Lymphocytes Relative: 10 %
Lymphs Abs: 1 10*3/uL (ref 0.7–4.0)
MCH: 25.1 pg — ABNORMAL LOW (ref 26.0–34.0)
MCHC: 32.3 g/dL (ref 30.0–36.0)
MCV: 77.7 fL — ABNORMAL LOW (ref 80.0–100.0)
Monocytes Absolute: 1.2 10*3/uL — ABNORMAL HIGH (ref 0.1–1.0)
Monocytes Relative: 12 %
Neutro Abs: 7.3 10*3/uL (ref 1.7–7.7)
Neutrophils Relative %: 76 %
Platelets: 361 10*3/uL (ref 150–400)
RBC: 3.9 MIL/uL (ref 3.87–5.11)
RDW: 13.5 % (ref 11.5–15.5)
WBC: 9.6 10*3/uL (ref 4.0–10.5)
nRBC: 0 % (ref 0.0–0.2)

## 2021-06-11 LAB — BASIC METABOLIC PANEL - CANCER CENTER ONLY
Anion gap: 7 (ref 5–15)
BUN: 12 mg/dL (ref 8–23)
CO2: 29 mmol/L (ref 22–32)
Calcium: 9.4 mg/dL (ref 8.9–10.3)
Chloride: 96 mmol/L — ABNORMAL LOW (ref 98–111)
Creatinine: 0.99 mg/dL (ref 0.44–1.00)
GFR, Estimated: 57 mL/min — ABNORMAL LOW (ref >60)
Glucose, Bld: 356 mg/dL — ABNORMAL HIGH (ref 70–99)
Potassium: 4.5 mmol/L (ref 3.5–5.1)
Sodium: 132 mmol/L — ABNORMAL LOW (ref 135–145)

## 2021-06-11 LAB — IRON AND IRON BINDING CAPACITY (CC-WL,HP ONLY)
Iron: 14 ug/dL — ABNORMAL LOW (ref 28–170)
Saturation Ratios: 5 % — ABNORMAL LOW (ref 10.4–31.8)
TIBC: 279 ug/dL (ref 250–450)
UIBC: 265 ug/dL (ref 148–442)

## 2021-06-11 LAB — GENETIC SCREENING ORDER

## 2021-06-11 LAB — FERRITIN: Ferritin: 263 ng/mL (ref 11–307)

## 2021-06-11 MED ORDER — SODIUM CHLORIDE 0.9 % IV SOLN: Freq: Once | INTRAVENOUS | Status: AC

## 2021-06-11 MED ORDER — TRASTUZUMAB-ANNS CHEMO 150 MG IV SOLR
6.0000 mg/kg | Freq: Once | INTRAVENOUS | Status: AC
  Administered 2021-06-11: 336 mg via INTRAVENOUS
  Filled 2021-06-11: qty 16

## 2021-06-11 NOTE — Progress Notes (Signed)
REFERRING PROVIDER: ?[name], MD ?[facility] ?[place],  [place] [address] ? ?PRIMARY PROVIDER:  ?None listed  ? ?PRIMARY REASON FOR VISIT:  ?Encounter Diagnoses  ?Name Primary?  ? Carcinoma of breast metastatic to bone, unspecified laterality ([place]) Yes  ? Family history of breast cancer   ? Family history of kidney cancer   ? ? ? ?HISTORY OF PRESENT ILLNESS:   ?[name], a 81 y.o. female, was seen for a [place] cancer genetics consultation at the request of Dr. Chryl Heck due to a personal history of breast cancer.  [name] presents to clinic today to discuss the possibility of a hereditary predisposition to cancer, to discuss genetic testing, and to further clarify her future cancer risks, as well as potential cancer risks for family members.  ? ?In January 2022, at the age of 28, [name] was diagnosed with metastatic breast cancer.  ? ?CANCER HISTORY:  ?Oncology History  ?Bone metastases  ?02/27/2020 Initial Diagnosis  ? Bone metastases ([place]) ?  ?Carcinoma of breast metastatic to bone [facility])  ?03/19/2020 Initial Diagnosis  ? Carcinoma of breast metastatic to bone [facility]) ? ?  ?03/24/2020 -  Chemotherapy  ? Patient is on Treatment Plan : BREAST Trastuzumab q21d  ? ?   ?04/15/2020 Cancer Staging  ? Staging form: Breast, AJCC 8th Edition ?- Clinical stage from 04/15/2020: Stage IV (cTX, cNX, cM1, GX, ER+, PR-, HER2+) - Signed by [name], MD on 04/15/2020 ?Stage prefix: Initial diagnosis ?Histologic grading system: 3 grade system ? ?  ? ? ? ?RISK FACTORS:  ?Colonoscopy: no; not examined. ?Hysterectomy: no.  ?Ovaries intact: yes.  ?Menarche was at age 79.  ?Nulliparous.   ?Menopausal status: postmenopausal.  ?OCP use for approximately 10 years.  ?HRT use: 1 years. ? ?Past Medical History:  ?Diagnosis Date  ? Breast cancer ([place])   ? with bone metastasis  ? ? ?Past Surgical History:  ?Procedure Laterality Date  ? IR PERC PLEURAL DRAIN W/INDWELL CATH W/IMG GUIDE  06/03/2020  ? IR RADIOLOGIST EVAL & MGMT   07/31/2020  ? IR REMOVAL OF PLURAL CATH W/CUFF  10/31/2020  ? ? ?FAMILY HISTORY:  ?We obtained a detailed, 4-generation family history.  Significant diagnoses are listed below: ?Family History  ?Problem Relation Age of Onset  ? Kidney cancer Brother 78  ? Esophageal cancer Maternal Aunt   ? Cancer Maternal Uncle   ?     unknown type; metastatic; d. 4s  ? Breast cancer Paternal Aunt   ?     dx 27s  ? Breast cancer Paternal Uncle   ?     dx 63s  ? Cancer Paternal Grandmother   ?     unknown type; ? breast ? lung; dx after 62  ? Breast cancer Cousin   ?     paternal female cousin; dx after 70  ? ? ? ?In addition the cancer listed above, she also reported metastatic prostate cancer, female breast cancer, and metastatic melanoma in maternal first cousins once removed. Ms. Farve is [place] Bills of previous family history of genetic testing for hereditary cancer risks. There is no reported Ashkenazi Jewish ancestry. There is no known consanguinity. ? ?GENETIC COUNSELING ASSESSMENT: [name] is a 81 y.o. female with a personal history of cancer which is somewhat suggestive of a hereditary cancer syndrome and predisposition to cancer given her diagnosis of metastatic breast cancer and the presence of related cancers in the family. We, therefore, discussed and recommended the following at  today's visit.  ? ?DISCUSSION: We discussed that 5 - 10% of cancer is hereditary.  Most cases of hereditary breast cancer are associated with mutations in BRCA1/2.  There are other genes that can be associated with hereditary breast cancer syndromes.  We discussed that testing is beneficial for several reasons including knowing how to follow individuals for their cancer risks, identifying whether potential treatment options, such as PARP inhibitors, would be beneficial, and understanding if other family members could be at risk for cancer and allowing them to undergo genetic testing.  ? ?We reviewed the characteristics, features and  inheritance patterns of hereditary cancer syndromes. We also discussed genetic testing, including the appropriate family members to test, the process of testing, insurance coverage and turn-around-time for results. We discussed the implications of a negative, positive, carrier and/or variant of uncertain significant result. We recommended Ms. Markel pursue genetic testing for a panel that includes genes associated with breast, kidney, prostate, and skin cancer.  ? ?The CancerNext-Expanded gene panel offered by [facility] and includes sequencing, rearrangement, and RNA analysis for the following 77 genes: AIP, ALK, APC, ATM, AXIN2, BAP1, BARD1, BLM, BMPR1A, BRCA1, BRCA2, BRIP1, CDC73, CDH1, CDK4, CDKN1B, CDKN2A, CHEK2, CTNNA1, DICER1, FANCC, FH, FLCN, GALNT12, KIF1B, LZTR1, MAX, MEN1, MET, MLH1, MSH2, MSH3, MSH6, MUTYH, NBN, NF1, NF2, NTHL1, PALB2, PHOX2B, PMS2, POT1, PRKAR1A, PTCH1, PTEN, RAD51C, RAD51D, RB1, RECQL, RET, SDHA, SDHAF2, SDHB, SDHC, SDHD, SMAD4, SMARCA4, SMARCB1, SMARCE1, STK11, SUFU, TMEM127, TP53, TSC1, TSC2, VHL and XRCC2 (sequencing and deletion/duplication); EGFR, EGLN1, HOXB13, KIT, MITF, PDGFRA, POLD1, and POLE (sequencing only); EPCAM and GREM1 (deletion/duplication only).  ? ?Based on Ms. Uliano's personal history of metastatic breast cancer, she meets medical criteria for genetic testing. Despite that she meets criteria, she may still have an out of pocket cost. We discussed that if her out of pocket cost for testing is over $100, the laboratory should contact her and discuss the self-pay prices and/or patient pay assistance programs.   ? ?PLAN: After considering the risks, benefits, and limitations, [name] provided informed consent to pursue genetic testing and the blood sample was sent to [facility] for analysis of the CancerNext-Expanded +RNAinsight Panel. Results should be available within approximately 3 weeks' time, at which point they will be disclosed by telephone to  Ms. Remlinger, as will any additional recommendations warranted by these results. Ms. Nall will receive a summary of her genetic counseling visit and a copy of her results once available. This information will also be available in Epic.  ? ?Lastly, we encouraged Ms. Steffey to remain in contact with cancer genetics annually so that we can continuously update the family history and inform her of any changes in cancer genetics and testing that may be of benefit for this family.  ? ?Ms. Siedschlag's questions were answered to her satisfaction today. Our contact information was provided should additional questions or concerns arise. Thank you for the referral and allowing [place] to share in the care of your patient.  ? ?Pranit Owensby M. Joette Catching, [place], [place] ?Genetic Counselor ?[email or web address] ?(P) [phone] ? ?The patient was seen for a total of 40 minutes in face-to-face genetic counseling.  The patient was seen alone.  Drs. Lindi Adie and/or Burr Medico were available to discuss this case as needed.  ? ? ?_______________________________________________________________________ ?For Office Staff:  ?Number of people involved in session: 1 ?Was an Intern/ student involved with case: no ? ?

## 2021-06-11 NOTE — Progress Notes (Signed)
?[facility]   ?[phone] [phone]   ?Clinic Follow up Note  ? ?Patient Care Team: ?Pcp, No as PCP - General ?[name], MD as Consulting Physician (Hematology and Oncology) ?06/11/2021 ? ?CHIEF COMPLAINT: Stage IV left breast cancer, ER+ / PR- / Her2+, grade 3 ? ?SUMMARY OF ONCOLOGIC HISTORY: ?Oncology History  ?Bone metastases  ?02/27/2020 Initial Diagnosis  ? Bone metastases ([place]) ?  ?Carcinoma of breast metastatic to bone [facility])  ?03/19/2020 Initial Diagnosis  ? Carcinoma of breast metastatic to bone [facility]) ?  ?03/24/2020 -  Chemotherapy  ? Patient is on Treatment Plan : BREAST Trastuzumab q21d  ?   ?04/15/2020 Cancer Staging  ? Staging form: Breast, AJCC 8th Edition ?- Clinical stage from 04/15/2020: Stage IV (cTX, cNX, cM1, GX, ER+, PR-, HER2+) - Signed by [name], MD on 04/15/2020 ?Stage prefix: Initial diagnosis ?Histologic grading system: 3 grade system ?  ? ? ?CURRENT THERAPY:  ?Anastrozole 1 mg p.o. daily ?Herceptin/Kanjinti q3 weeks ?Zometa q3 months ? ?INTERVAL HISTORY:  ? ?Ms. Ellzey returns for follow-up except for fatigue. ?She says the heart medication has been making more tired. ?NO change in LE swelling. ?She denies any change in breathing. ?NO change in bowel habits. ?Weight is stable over all. ?No chest pain, chest pressure. ?Rest of the pertinent 10 point ROS reviewed and neg ? ?MEDICAL HISTORY:  ?Past Medical History:  ?Diagnosis Date  ? Breast cancer ([place])   ? with bone metastasis  ? ? ?SURGICAL HISTORY: ?Past Surgical History:  ?Procedure Laterality Date  ? IR PERC PLEURAL DRAIN W/INDWELL CATH W/IMG GUIDE  06/03/2020  ? IR RADIOLOGIST EVAL & MGMT  07/31/2020  ? IR REMOVAL OF PLURAL CATH W/CUFF  10/31/2020  ? ? ?I have reviewed the social history and family history with the patient and they are unchanged from previous note. ? ?ALLERGIES:  is allergic to vicodin hp [hydrocodone-acetaminophen]. ? ?MEDICATIONS:  ?Current Outpatient Medications   ?Medication Sig Dispense Refill  ? anastrozole (ARIMIDEX) 1 MG tablet Take 1 tablet (1 mg total) by mouth daily. 30 tablet 11  ? carvedilol (COREG) 6.25 MG tablet Take 1 tablet (6.25 mg total) by mouth 2 (two) times daily. 180 tablet 3  ? Chlorphen-Pseudoephed-APAP (TYLENOL ALLERGY SINUS PO) Take 1 tablet by mouth daily as needed (allergy).    ? furosemide (LASIX) 20 MG tablet Take 1 tablet (20 mg total) by mouth daily. 30 tablet 0  ? oxymetazoline (AFRIN) 0.05 % nasal spray Place 1 spray into both nostrils daily as needed for congestion.    ? sacubitril-valsartan (ENTRESTO) 49-51 MG Take 1 tablet by mouth 2 (two) times daily. 180 tablet 3  ? ?No current facility-administered medications for this visit.  ? ?Review of Systems  ?Constitutional:  Negative for chills, fever, malaise/fatigue and weight loss.  ?HENT:  Negative for sore throat.   ?Respiratory:  Negative for cough, shortness of breath and wheezing.   ?Cardiovascular:  Negative for chest pain, palpitations and leg swelling.  ?Gastrointestinal:  Negative for abdominal pain, blood in stool, constipation, diarrhea, melena, nausea and vomiting.  ?Musculoskeletal:  Negative for myalgias.  ?Skin:  Negative for itching and rash.  ? ? ?PHYSICAL EXAMINATION: ?ECOG PERFORMANCE STATUS: 0 - Asymptomatic ? ?Vitals:  ? 06/11/21 1332  ?BP: (!) 160/64  ?Pulse: 78  ?Resp: 16  ?Temp: 97.9 ?F (36.6 ?C)  ?SpO2: 99%  ? ?Filed Weights  ? 06/11/21 1332  ?Weight: 122 lb 4.8 oz (55.5 kg)  ? ?Physical Exam ?  Constitutional:   ?   Appearance: Normal appearance.  ?HENT:  ?   Head: Normocephalic and atraumatic.  ?Cardiovascular:  ?   Rate and Rhythm: Normal rate and regular rhythm.  ?   Pulses: Normal pulses.  ?   Heart sounds: Normal heart sounds.  ?Pulmonary:  ?   Effort: Pulmonary effort is normal.  ?   Breath sounds: Normal breath sounds.  ?Chest:  ?   Comments: Left breast continues to show areas which are cleared, no palpable left axillary LN ?Abdominal:  ?   General: Abdomen is  flat. Bowel sounds are normal.  ?Musculoskeletal:     ?   General: No swelling (LUE in Ace wrap, patient says swelling has decreased remarkably) or tenderness.  ?Skin: ?   General: Skin is warm and dry.  ?Neurological:  ?   General: No focal deficit present.  ?   Mental Status: She is alert.  ? ? ? ?LABORATORY DATA:  ?I have reviewed the data as listed ? ?  Latest Ref Rng & Units 05/21/2021  ?  1:47 PM 04/09/2021  ?  1:26 PM 02/18/2021  ? 10:12 AM  ?CBC  ?WBC 4.0 - 10.5 K/uL 8.5   7.2   7.3    ?Hemoglobin 12.0 - 15.0 g/dL 9.9   11.4   12.7    ?Hematocrit 36.0 - 46.0 % 29.7   33.2   37.7    ?Platelets 150 - 400 K/uL 304   252   203    ? ? ? ? ?  Latest Ref Rng & Units 06/11/2021  ? 12:03 PM 05/21/2021  ?  1:47 PM 04/30/2021  ?  1:06 PM  ?CMP  ?Glucose 70 - 99 mg/dL 356   197   177    ?BUN 8 - 23 mg/dL 12   14   11     ?Creatinine 0.44 - 1.00 mg/dL 0.99   0.91   0.70    ?Sodium 135 - 145 mmol/L 132   136   138    ?Potassium 3.5 - 5.1 mmol/L 4.5   4.2   4.2    ?Chloride 98 - 111 mmol/L 96   102   103    ?CO2 22 - 32 mmol/L 29   28   26     ?Calcium 8.9 - 10.3 mg/dL 9.4   9.2   9.5    ?Total Protein 6.5 - 8.1 g/dL  7.3     ?Total Bilirubin 0.3 - 1.2 mg/dL  0.3     ?Alkaline Phos 38 - 126 U/L  106     ?AST 15 - 41 U/L  12     ?ALT 0 - 44 U/L  9     ? ? ? ? ?RADIOGRAPHIC STUDIES: ?I have personally reviewed the radiological images as listed and agreed with the findings in the report. ?No results found.  ? ?ASSESSMENT & PLAN: 81 year old female ? ?# Metastatic breast cancer  ?-CT evidence of hepatic, osseous, pulmonary, and axillary nodal metastatic disease  ?-Biopsy 02/29/2020 left axillary/chest wall mass confirmed metastatic carcinoma consistent with breast primary, ER +50% weak, PR negative, HER2 positive 3+.  She is currently on trastuzumab every 21 days started in February 2022, anastrozole 1 mg p.o. daily and Zometa for bone strengthening. ?-Received palliative radiation to the left axilla from 05/28/2020-06/25/2020 ?-Last  echocardiogram showed an ejection fraction drop of almost 20 to 25%.  We held trastuzumab and referred her to cardiology for further recommendations. Dr.  Bensimhon has started her on carvedilol and Entresto for cardioprotection.   ?--Repeat echo in February showed EF of 55 to 60% ?-I have discussed with cardiology and they are okay with [place] resuming Herceptin.   ?Clinically since she started Herceptin she has been doing quite well. ?No new concerns today except for some fatigue, ok to proceed with herceptin ?She has FU with cardiology coming up. ? ?# bone health awaiting completion of dental procedures, ? ?#LUE Lymphedema: with significant improvement. ? ?Overall, I do not see any significant concerns suggestive of disease progression.  We will proceed with repeat imaging ? ?# Microcytic hypochromic anemia.  Iron saturation seems to be low at 5%, awaiting ferritin results.  I will start her on oral iron supplementation if she has low ferritin on labs from today. ? ?Total time spent: 30 minutes ? ?All questions were answered. The patient knows to call the clinic with any problems, questions or concerns. No barriers to learning were detected. ? ?I have spent a total of 30 minutes minutes of face-to-face and non-face-to-face time, preparing to see the patient, performing a medically appropriate examination, counseling and educating the patient, referring and communicating with other health care professionals, documenting clinical information in the electronic health record, and care coordination.  ? ? ?[name], MD ?Hematology and Oncology ?[facility] ?P: [phone] ?  ?

## 2021-06-11 NOTE — Patient Instructions (Signed)
[facility]  Discharge Instructions: ?Thank you for choosing [facility] to provide your oncology and hematology care.  ? ?If you have a lab appointment with the [facility], please go directly to the [facility] and check in at the registration area. ?  ?Wear comfortable clothing and clothing appropriate for easy access to any Portacath or PICC line.  ? ?We strive to give you quality time with your provider. You may need to reschedule your appointment if you arrive late (15 or more minutes).  Arriving late affects you and other patients whose appointments are after yours.  Also, if you miss three or more appointments without notifying the office, you may be dismissed from the clinic at the provider?s discretion.    ?  ?For prescription refill requests, have your pharmacy contact our office and allow 72 hours for refills to be completed.   ? ?Today you received the following chemotherapy and/or immunotherapy agents: Kanjinti ?  ?To help prevent nausea and vomiting after your treatment, we encourage you to take your nausea medication as directed. ? ?BELOW ARE SYMPTOMS THAT SHOULD BE REPORTED IMMEDIATELY: ?*FEVER GREATER THAN 100.4 F (38 ?C) OR HIGHER ?*CHILLS OR SWEATING ?*NAUSEA AND VOMITING THAT IS NOT CONTROLLED WITH YOUR NAUSEA MEDICATION ?*UNUSUAL SHORTNESS OF BREATH ?*UNUSUAL BRUISING OR BLEEDING ?*URINARY PROBLEMS (pain or burning when urinating, or frequent urination) ?*BOWEL PROBLEMS (unusual diarrhea, constipation, pain near the anus) ?TENDERNESS IN MOUTH AND THROAT WITH OR WITHOUT PRESENCE OF ULCERS (sore throat, sores in mouth, or a toothache) ?UNUSUAL RASH, SWELLING OR PAIN  ?UNUSUAL VAGINAL DISCHARGE OR ITCHING  ? ?Items with * indicate a potential emergency and should be followed up as soon as possible or go to the Emergency Department if any problems should occur. ? ?Please show the CHEMOTHERAPY ALERT CARD or IMMUNOTHERAPY ALERT CARD at check-in to the  Emergency Department and triage nurse. ? ?Should you have questions after your visit or need to cancel or reschedule your appointment, please contact [facility]  Dept: [phone]  and follow the prompts.  Office hours are 8:00 a.m. to 4:30 p.m. Monday - Friday. Please note that voicemails left after 4:00 p.m. may not be returned until the following business day.  We are closed weekends and major holidays. You have access to a nurse at all times for urgent questions. Please call the main number to the clinic Dept: [phone] and follow the prompts. ? ? ?For any non-urgent questions, you may also contact your provider using MyChart. We now offer e-Visits for anyone 43 and older to request care online for non-urgent symptoms. For details visit [email or web address]. ?  ?Also download the MyChart app! Go to the app store, search "MyChart", open the app, select [facility], and log in with your MyChart username and password. ? ?Due to Covid, a mask is required upon entering the hospital/clinic. If you do not have a mask, one will be given to you upon arrival. For doctor visits, patients may have 1 support person aged 40 or older with them. For treatment visits, patients cannot have anyone with them due to current Covid guidelines and our immunocompromised population.  ? ?

## 2021-06-12 ENCOUNTER — Telehealth: Payer: Self-pay | Admitting: *Deleted

## 2021-06-12 NOTE — Telephone Encounter (Signed)
This RN contacted [employer] post authorization received for CT. ?Scan scheduled for 5/15 at 3:30 arrive by 3:15- NPO 4 hours before and need for pt to pick up contrast. ? ?This RN called pt's home number with no answer x 15 rings. ?This RN then called cell number - obtained identified message - message left per above and how to obtain the contrast. ? ?This RN did ask for a return call to verify she is aware of appt. ?

## 2021-06-15 DIAGNOSIS — H04123 Dry eye syndrome of bilateral lacrimal glands: Secondary | ICD-10-CM | POA: Diagnosis not present

## 2021-06-15 DIAGNOSIS — H26493 Other secondary cataract, bilateral: Secondary | ICD-10-CM | POA: Diagnosis not present

## 2021-06-16 ENCOUNTER — Telehealth: Payer: Self-pay

## 2021-06-16 ENCOUNTER — Ambulatory Visit (HOSPITAL_COMMUNITY)
Admission: RE | Admit: 2021-06-16 | Discharge: 2021-06-16 | Disposition: A | Discharge status: Home / Self Care | Payer: Medicare HMO | Source: Ambulatory Visit | Attending: Hematology and Oncology | Admitting: Hematology and Oncology

## 2021-06-16 DIAGNOSIS — S22080A Wedge compression fracture of T11-T12 vertebra, initial encounter for closed fracture: Secondary | ICD-10-CM | POA: Diagnosis not present

## 2021-06-16 DIAGNOSIS — C7951 Secondary malignant neoplasm of bone: Secondary | ICD-10-CM | POA: Insufficient documentation

## 2021-06-16 DIAGNOSIS — C50919 Malignant neoplasm of unspecified site of unspecified female breast: Secondary | ICD-10-CM | POA: Diagnosis not present

## 2021-06-16 DIAGNOSIS — C787 Secondary malignant neoplasm of liver and intrahepatic bile duct: Secondary | ICD-10-CM | POA: Diagnosis not present

## 2021-06-16 DIAGNOSIS — K8020 Calculus of gallbladder without cholecystitis without obstruction: Secondary | ICD-10-CM | POA: Diagnosis not present

## 2021-06-16 DIAGNOSIS — K449 Diaphragmatic hernia without obstruction or gangrene: Secondary | ICD-10-CM | POA: Diagnosis not present

## 2021-06-16 MED ORDER — IOHEXOL 9 MG/ML PO SOLN
ORAL | Status: AC
  Filled 2021-06-16: qty 1000

## 2021-06-16 MED ORDER — IOHEXOL 300 MG/ML  SOLN
80.0000 mL | Freq: Once | INTRAMUSCULAR | Status: AC | PRN
  Administered 2021-06-16: 80 mL via INTRAVENOUS

## 2021-06-16 NOTE — Telephone Encounter (Signed)
Pt called and asks if it is OK for her to have water-based contrast, and asks how to get it. Advised pt she would need to drink contrast in CT and she should arrive 2 hours early for this. Spoke with CT techs and they advised pt to go ahead and come to have contrast then the scan. Pt is aware and verbalized agreement.  ?

## 2021-06-25 ENCOUNTER — Ambulatory Visit (HOSPITAL_COMMUNITY)
Admission: RE | Admit: 2021-06-25 | Discharge: 2021-06-25 | Disposition: A | Discharge status: Home / Self Care | Payer: Medicare HMO | Source: Ambulatory Visit | Attending: Internal Medicine | Admitting: Internal Medicine

## 2021-06-25 ENCOUNTER — Encounter (HOSPITAL_COMMUNITY): Payer: Self-pay | Admitting: Internal Medicine

## 2021-06-25 ENCOUNTER — Ambulatory Visit (HOSPITAL_BASED_OUTPATIENT_CLINIC_OR_DEPARTMENT_OTHER)
Admission: RE | Admit: 2021-06-25 | Discharge: 2021-06-25 | Disposition: A | Discharge status: Home / Self Care | Payer: Medicare HMO | Source: Ambulatory Visit | Attending: Internal Medicine | Admitting: Internal Medicine

## 2021-06-25 VITALS — BP 162/78 | HR 73 | Wt 121.0 lb

## 2021-06-25 DIAGNOSIS — T451X5A Adverse effect of antineoplastic and immunosuppressive drugs, initial encounter: Secondary | ICD-10-CM

## 2021-06-25 DIAGNOSIS — H5462 Unqualified visual loss, left eye, normal vision right eye: Secondary | ICD-10-CM | POA: Diagnosis not present

## 2021-06-25 DIAGNOSIS — Z7901 Long term (current) use of anticoagulants: Secondary | ICD-10-CM | POA: Diagnosis not present

## 2021-06-25 DIAGNOSIS — E8809 Other disorders of plasma-protein metabolism, not elsewhere classified: Secondary | ICD-10-CM | POA: Diagnosis not present

## 2021-06-25 DIAGNOSIS — Z170 Estrogen receptor positive status [ER+]: Secondary | ICD-10-CM | POA: Insufficient documentation

## 2021-06-25 DIAGNOSIS — C50912 Malignant neoplasm of unspecified site of left female breast: Secondary | ICD-10-CM

## 2021-06-25 DIAGNOSIS — J90 Pleural effusion, not elsewhere classified: Secondary | ICD-10-CM | POA: Insufficient documentation

## 2021-06-25 DIAGNOSIS — Z0189 Encounter for other specified special examinations: Secondary | ICD-10-CM | POA: Diagnosis not present

## 2021-06-25 DIAGNOSIS — Z79899 Other long term (current) drug therapy: Secondary | ICD-10-CM | POA: Insufficient documentation

## 2021-06-25 DIAGNOSIS — R9431 Abnormal electrocardiogram [ECG] [EKG]: Secondary | ICD-10-CM | POA: Insufficient documentation

## 2021-06-25 DIAGNOSIS — R0600 Dyspnea, unspecified: Secondary | ICD-10-CM | POA: Insufficient documentation

## 2021-06-25 DIAGNOSIS — I10 Essential (primary) hypertension: Secondary | ICD-10-CM

## 2021-06-25 DIAGNOSIS — R000 Tachycardia, unspecified: Secondary | ICD-10-CM | POA: Diagnosis not present

## 2021-06-25 DIAGNOSIS — I509 Heart failure, unspecified: Secondary | ICD-10-CM | POA: Insufficient documentation

## 2021-06-25 DIAGNOSIS — I427 Cardiomyopathy due to drug and external agent: Secondary | ICD-10-CM | POA: Diagnosis not present

## 2021-06-25 DIAGNOSIS — C7951 Secondary malignant neoplasm of bone: Secondary | ICD-10-CM | POA: Diagnosis not present

## 2021-06-25 DIAGNOSIS — I110 Hypertensive heart disease with heart failure: Secondary | ICD-10-CM | POA: Diagnosis not present

## 2021-06-25 LAB — ECHOCARDIOGRAM COMPLETE
AR max vel: 2.51 cm2
AV Area VTI: 2.63 cm2
AV Area mean vel: 2.41 cm2
AV Mean grad: 5 mmHg
AV Peak grad: 9.6 mmHg
Ao pk vel: 1.55 m/s
Area-P 1/2: 3.27 cm2
S' Lateral: 2.8 cm

## 2021-06-25 MED ORDER — CARVEDILOL 6.25 MG PO TABS: 6.2500 mg | ORAL_TABLET | Freq: Two times a day (BID) | ORAL | 6 refills | Status: AC

## 2021-06-25 MED ORDER — ENTRESTO 49-51 MG PO TABS: 1.0000 | ORAL_TABLET | Freq: Two times a day (BID) | ORAL | 6 refills | Status: DC

## 2021-06-25 MED ORDER — PERFLUTREN LIPID MICROSPHERE
1.0000 mL | INTRAVENOUS | Status: DC | PRN
  Administered 2021-06-25: 2 mL via INTRAVENOUS
  Filled 2021-06-25: qty 10

## 2021-06-25 NOTE — Progress Notes (Signed)
CARDIO-ONCOLOGY CLINIC CONSULT NOTE  Referring Physician: Dr. Chryl Heck Primary Care: Pcp, No Primary Cardiologist: New  HPI:  Ms. Appelhans is 81 y.o. female with h/o HTN (new diagnosis)  with stage IV left breast cancer referred by Dr. Chryl Heck for enrollment into the Cardio-Oncology program due to possible chemotherapy-induced cardiomyopathy.   Carcinoma of breast metastatic to bone ([place])  03/19/2020 Initial Diagnosis    Carcinoma of breast metastatic to bone ([place])    03/24/2020 -  Chemotherapy    Patient is on Treatment Plan : BREAST Trastuzumab q21d     04/15/2020 Cancer Staging    Staging form: Breast, AJCC 8th Edition - Clinical stage from 04/15/2020: Stage IV (cTX, cNX, cM1, GX, ER+, PR-, HER2+) - Signed by [name], MD on 04/15/2020 Stage prefix: Initial diagnosis Histologic grading system: 3 grade system       Has been very healthy. No real past medical issues. Diagnosed with left breast breast CA in 1/22 with mets to bone. Started on herceptin in 2/22 and arimidex. Also had XRT to left arm lymph nodes. Has been on Herceptin q3 weeks x March as monotherapy + Zometa  In 4/22 admitted with respiratory failure and LE edema. Found to have bilateral pleural effusions. Underwent thorcenteses and placement of Pleurex tube on left. Effusions negative for malignant cells. Pleurex removed in May. Remains on po lasix with resolution of LE edema. Hypoalbuminemia felt to be playing a role. Echo 05/28/20 EF 60-65%.  Received palliative radiation to the left axilla from 05/28/2020-06/25/2020  Echo 12/22 EF 40-45%. Herceptin held.   Echo today 03/26/21 EF 55-60% GLS -18.1  Herceptin restarted  Here with her daughter. Has had 4 more rounds of Herceptin. Tolerated well. Not as active due to problems with vision loss in left eye. Denies SOB, orthopnea or PND. Cut Entresto and carvedilol back to once per day due to cough about 2 weeks ago.  Echo today 06/25/21: EF 60-65% G1DD.     ECHO (01/15/21)  EF 40-45% Echo 10/20/20 EF 60-65%  Echo 05/28/20 EF 60-65%   Past Medical History:  Diagnosis Date   Breast cancer ([place])    with bone metastasis   Family history of breast cancer 06/11/2021   Family history of kidney cancer 06/11/2021    Current Outpatient Medications  Medication Sig Dispense Refill   anastrozole (ARIMIDEX) 1 MG tablet Take 1 tablet (1 mg total) by mouth daily. 30 tablet 11   carvedilol (COREG) 6.25 MG tablet Take 6.25 mg by mouth daily.     Chlorphen-Pseudoephed-APAP (TYLENOL ALLERGY SINUS PO) Take 1 tablet by mouth daily as needed (allergy).     oxymetazoline (AFRIN) 0.05 % nasal spray Place 1 spray into both nostrils daily as needed for congestion.     sacubitril-valsartan (ENTRESTO) 49-51 MG Take 1 tablet by mouth daily.     No current facility-administered medications for this encounter.   Facility-Administered Medications Ordered in Other Encounters  Medication Dose Route Frequency Provider Last Rate Last Admin   perflutren lipid microspheres (DEFINITY) IV suspension  1-10 mL Intravenous PRN Lelon Perla, MD   2 mL at 06/25/21 1420    Allergies  Allergen Reactions   Vicodin Hp [Hydrocodone-Acetaminophen]     Brain fog      Social History   Socioeconomic History   Marital status: Widowed    Spouse name: Not on file   Number of children: Not on file   Years of education: Not on file   Highest education level: Not  on file  Occupational History   Not on file  Tobacco Use   Smoking status: Never   Smokeless tobacco: Never  Vaping Use   Vaping Use: Never used  Substance and Sexual Activity   Alcohol use: Not Currently   Drug use: Never   Sexual activity: Not on file  Other Topics Concern   Not on file  Social History Narrative   No biological children   Has 2 stepchildren   Social Determinants of Health   Financial Resource Strain: Not on file  Food Insecurity: Not on file  Transportation Needs: Not on file  Physical Activity: Not on  file  Stress: Not on file  Social Connections: Not on file  Intimate Partner Violence: Not on file      Family History  Problem Relation Age of Onset   Stroke Mother    Heart attack Father    Kidney cancer Brother 7   Esophageal cancer Maternal Aunt    Cancer Maternal Uncle        unknown type; metastatic; d. 76s   Breast cancer Paternal Aunt        dx 1s   Breast cancer Paternal Uncle        dx 57s   Cancer Paternal Grandmother        unknown type; ? breast ? lung; dx after 35   Breast cancer Cousin        paternal female cousin; dx after 67    Vitals:   06/25/21 1416  BP: (!) 162/78  Pulse: 73  SpO2: 96%  Weight: 54.9 kg (121 lb)    PHYSICAL EXAM: General:  Elderly. Well appearing. No resp difficulty HEENT: normal Neck: supple. no JVD. Carotids 2+ bilat; no bruits. No lymphadenopathy or thryomegaly appreciated. Cor: PMI nondisplaced. Regular rate & rhythm. No rubs, gallops or murmurs. Lungs: clear Abdomen: soft, nontender, nondistended. No hepatosplenomegaly. No bruits or masses. Good bowel sounds. Extremities: no cyanosis, clubbing, rash, edema + lymphedema sleeve on left arm Neuro: alert & orientedx3, cranial nerves grossly intact. moves all 4 extremities w/o difficulty. Affect pleasant   ASSESSMENT & PLAN:  1. Chemotherapy-induced cardiomyopathy  - suspect she had herceptin-induced cardiomyopathy that is now resolved - started herceptin 3/22 - Echo 05/28/20 EF 60-65% - Echo 10/20/20 EF 60-65% - 01/15/21 EF 40-45% -> herceptin held  - Echo 2/23 55-60% GLS -18.1 -> herceptin restarted - Echo today 06/25/21 EF 60-65% G1DD - Increase entresto  back to 49-51 mg twice a day.  - Increase  carvedilol back to 3.125 bid  - Continue Herceptin - Continue echos q3 months  2. Carcinoma of left breast metastatic to bone ([facility]) - diagnosesd2/22 ER positive, PR negative and HER2 amplified breast cancer currently on Arimidex and Herceptin  - plan as above   3. HTN -  Elevated.  - changes as above  4. Pleural effusions - s/p previous pleurex   Glori Bickers, MD  2:51 PM

## 2021-06-25 NOTE — Progress Notes (Signed)
  Echocardiogram 2D Echocardiogram with contrast has been performed.  Merrie Roof F 06/25/2021, 2:19 PM

## 2021-06-25 NOTE — Progress Notes (Signed)
ReDS Vest / Clip - 06/25/21 1500       ReDS Vest / Clip   Station Marker A    Ruler Value 28    ReDS Value Range Low volume    ReDS Actual Value 29

## 2021-06-25 NOTE — Patient Instructions (Addendum)
Increase Entresto to 49/51 mg Twice daily   Increase Carvedilol to 6.25 mg Twice daily   Your physician recommends that you schedule a follow-up appointment in: 3 months with echocardiogram  If you have any questions or concerns before your next appointment please send [place] a message through [place] or call our office at [phone].    TO LEAVE A MESSAGE FOR THE NURSE SELECT OPTION 2, PLEASE LEAVE A MESSAGE INCLUDING: YOUR NAME DATE OF BIRTH CALL BACK NUMBER REASON FOR CALL**this is important as we prioritize the call backs  YOU WILL RECEIVE A CALL BACK THE SAME DAY AS LONG AS YOU CALL BEFORE 4:00 PM  At the [facility], you and your health needs are our priority. As part of our continuing mission to provide you with exceptional heart care, we have created designated Provider Care Teams. These Care Teams include your primary Cardiologist (physician) and Advanced Practice Providers (APPs- Physician Assistants and Nurse Practitioners) who all work together to provide you with the care you need, when you need it.   You may see any of the following providers on your designated Care Team at your next follow up: Dr Glori Bickers Dr Haynes Kerns, NP Lyda Jester, [place] [facility] [place], [place] Audry Riles, PharmD   Please be sure to bring in all your medications bottles to every appointment.

## 2021-06-25 NOTE — Addendum Note (Signed)
Encounter addended by: Scarlette Calico, RN on: 06/25/2021 3:32 PM  Actions taken: Order list changed, Diagnosis association updated, Clinical Note Signed

## 2021-07-02 ENCOUNTER — Encounter (HOSPITAL_COMMUNITY): Payer: Self-pay | Admitting: Internal Medicine

## 2021-07-02 ENCOUNTER — Other Ambulatory Visit: Payer: Self-pay

## 2021-07-02 ENCOUNTER — Inpatient Hospital Stay: Payer: Medicare HMO | Attending: Hematology and Oncology

## 2021-07-02 ENCOUNTER — Inpatient Hospital Stay: Payer: Medicare HMO | Admitting: Hematology and Oncology

## 2021-07-02 ENCOUNTER — Encounter: Payer: Self-pay | Admitting: Hematology and Oncology

## 2021-07-02 ENCOUNTER — Inpatient Hospital Stay: Payer: Medicare HMO

## 2021-07-02 VITALS — BP 146/64 | HR 74 | Temp 97.7°F | Wt 119.2 lb

## 2021-07-02 DIAGNOSIS — Z803 Family history of malignant neoplasm of breast: Secondary | ICD-10-CM | POA: Diagnosis not present

## 2021-07-02 DIAGNOSIS — Z79899 Other long term (current) drug therapy: Secondary | ICD-10-CM | POA: Diagnosis not present

## 2021-07-02 DIAGNOSIS — Z170 Estrogen receptor positive status [ER+]: Secondary | ICD-10-CM | POA: Insufficient documentation

## 2021-07-02 DIAGNOSIS — C787 Secondary malignant neoplasm of liver and intrahepatic bile duct: Secondary | ICD-10-CM | POA: Diagnosis not present

## 2021-07-02 DIAGNOSIS — Z8052 Family history of malignant neoplasm of bladder: Secondary | ICD-10-CM | POA: Insufficient documentation

## 2021-07-02 DIAGNOSIS — C50912 Malignant neoplasm of unspecified site of left female breast: Secondary | ICD-10-CM

## 2021-07-02 DIAGNOSIS — C50919 Malignant neoplasm of unspecified site of unspecified female breast: Secondary | ICD-10-CM

## 2021-07-02 DIAGNOSIS — Z923 Personal history of irradiation: Secondary | ICD-10-CM | POA: Insufficient documentation

## 2021-07-02 DIAGNOSIS — Z79811 Long term (current) use of aromatase inhibitors: Secondary | ICD-10-CM | POA: Insufficient documentation

## 2021-07-02 DIAGNOSIS — C7800 Secondary malignant neoplasm of unspecified lung: Secondary | ICD-10-CM | POA: Insufficient documentation

## 2021-07-02 DIAGNOSIS — C773 Secondary and unspecified malignant neoplasm of axilla and upper limb lymph nodes: Secondary | ICD-10-CM | POA: Diagnosis not present

## 2021-07-02 DIAGNOSIS — C7931 Secondary malignant neoplasm of brain: Secondary | ICD-10-CM | POA: Insufficient documentation

## 2021-07-02 DIAGNOSIS — C7951 Secondary malignant neoplasm of bone: Secondary | ICD-10-CM | POA: Diagnosis not present

## 2021-07-02 DIAGNOSIS — Z66 Do not resuscitate: Secondary | ICD-10-CM | POA: Diagnosis not present

## 2021-07-02 DIAGNOSIS — C50911 Malignant neoplasm of unspecified site of right female breast: Secondary | ICD-10-CM | POA: Insufficient documentation

## 2021-07-02 DIAGNOSIS — R197 Diarrhea, unspecified: Secondary | ICD-10-CM | POA: Insufficient documentation

## 2021-07-02 DIAGNOSIS — I890 Lymphedema, not elsewhere classified: Secondary | ICD-10-CM | POA: Diagnosis not present

## 2021-07-02 DIAGNOSIS — Z5112 Encounter for antineoplastic immunotherapy: Secondary | ICD-10-CM | POA: Insufficient documentation

## 2021-07-02 DIAGNOSIS — R739 Hyperglycemia, unspecified: Secondary | ICD-10-CM | POA: Diagnosis not present

## 2021-07-02 DIAGNOSIS — F411 Generalized anxiety disorder: Secondary | ICD-10-CM

## 2021-07-02 LAB — CBC WITH DIFFERENTIAL/PLATELET
Abs Immature Granulocytes: 0.04 10*3/uL (ref 0.00–0.07)
Basophils Absolute: 0 10*3/uL (ref 0.0–0.1)
Basophils Relative: 0 %
Eosinophils Absolute: 0 10*3/uL (ref 0.0–0.5)
Eosinophils Relative: 0 %
HCT: 30.4 % — ABNORMAL LOW (ref 36.0–46.0)
Hemoglobin: 9.8 g/dL — ABNORMAL LOW (ref 12.0–15.0)
Immature Granulocytes: 1 %
Lymphocytes Relative: 16 %
Lymphs Abs: 1.3 10*3/uL (ref 0.7–4.0)
MCH: 24.1 pg — ABNORMAL LOW (ref 26.0–34.0)
MCHC: 32.2 g/dL (ref 30.0–36.0)
MCV: 74.9 fL — ABNORMAL LOW (ref 80.0–100.0)
Monocytes Absolute: 1.1 10*3/uL — ABNORMAL HIGH (ref 0.1–1.0)
Monocytes Relative: 14 %
Neutro Abs: 5.3 10*3/uL (ref 1.7–7.7)
Neutrophils Relative %: 69 %
Platelets: 314 10*3/uL (ref 150–400)
RBC: 4.06 MIL/uL (ref 3.87–5.11)
RDW: 14.6 % (ref 11.5–15.5)
WBC: 7.8 10*3/uL (ref 4.0–10.5)
nRBC: 0 % (ref 0.0–0.2)

## 2021-07-02 LAB — BASIC METABOLIC PANEL - CANCER CENTER ONLY
Anion gap: 7 (ref 5–15)
BUN: 12 mg/dL (ref 8–23)
CO2: 29 mmol/L (ref 22–32)
Calcium: 9.7 mg/dL (ref 8.9–10.3)
Chloride: 94 mmol/L — ABNORMAL LOW (ref 98–111)
Creatinine: 0.92 mg/dL (ref 0.44–1.00)
GFR, Estimated: >60 mL/min (ref >60)
Glucose, Bld: 478 mg/dL — ABNORMAL HIGH (ref 70–99)
Potassium: 4.3 mmol/L (ref 3.5–5.1)
Sodium: 130 mmol/L — ABNORMAL LOW (ref 135–145)

## 2021-07-02 MED ORDER — SODIUM CHLORIDE 0.9 % IV SOLN: Freq: Once | INTRAVENOUS | Status: AC

## 2021-07-02 MED ORDER — METFORMIN HCL 500 MG PO TABS: 500.0000 mg | ORAL_TABLET | Freq: Every day | ORAL | 0 refills | Status: AC

## 2021-07-02 MED ORDER — TRASTUZUMAB-ANNS CHEMO 150 MG IV SOLR
6.0000 mg/kg | Freq: Once | INTRAVENOUS | Status: AC
  Administered 2021-07-02: 336 mg via INTRAVENOUS
  Filled 2021-07-02: qty 16

## 2021-07-02 NOTE — Patient Instructions (Signed)
[facility] ONCOLOGY  Discharge Instructions: Thank you for choosing [facility] to provide your oncology and hematology care.   If you have a lab appointment with the [facility], please go directly to the [facility] and check in at the registration area.   Wear comfortable clothing and clothing appropriate for easy access to any Portacath or PICC line.   We strive to give you quality time with your provider. You may need to reschedule your appointment if you arrive late (15 or more minutes).  Arriving late affects you and other patients whose appointments are after yours.  Also, if you miss three or more appointments without notifying the office, you may be dismissed from the clinic at the provider's discretion.      For prescription refill requests, have your pharmacy contact our office and allow 72 hours for refills to be completed.    Today you received the following chemotherapy and/or immunotherapy agents herceptin      To help prevent nausea and vomiting after your treatment, we encourage you to take your nausea medication as directed.  BELOW ARE SYMPTOMS THAT SHOULD BE REPORTED IMMEDIATELY: *FEVER GREATER THAN 100.4 F (38 C) OR HIGHER *CHILLS OR SWEATING *NAUSEA AND VOMITING THAT IS NOT CONTROLLED WITH YOUR NAUSEA MEDICATION *UNUSUAL SHORTNESS OF BREATH *UNUSUAL BRUISING OR BLEEDING *URINARY PROBLEMS (pain or burning when urinating, or frequent urination) *BOWEL PROBLEMS (unusual diarrhea, constipation, pain near the anus) TENDERNESS IN MOUTH AND THROAT WITH OR WITHOUT PRESENCE OF ULCERS (sore throat, sores in mouth, or a toothache) UNUSUAL RASH, SWELLING OR PAIN  UNUSUAL VAGINAL DISCHARGE OR ITCHING   Items with * indicate a potential emergency and should be followed up as soon as possible or go to the Emergency Department if any problems should occur.  Please show the CHEMOTHERAPY ALERT CARD or IMMUNOTHERAPY ALERT CARD at check-in to  the Emergency Department and triage nurse.  Should you have questions after your visit or need to cancel or reschedule your appointment, please contact [facility]  Dept: [phone]  and follow the prompts.  Office hours are 8:00 a.m. to 4:30 p.m. Monday - Friday. Please note that voicemails left after 4:00 p.m. may not be returned until the following business day.  We are closed weekends and major holidays. You have access to a nurse at all times for urgent questions. Please call the main number to the clinic Dept: [phone] and follow the prompts.   For any non-urgent questions, you may also contact your provider using MyChart. We now offer e-Visits for anyone 67 and older to request care online for non-urgent symptoms. For details visit [email or web address].   Also download the MyChart app! Go to the app store, search "MyChart", open the app, select [facility], and log in with your MyChart username and password.  Due to Covid, a mask is required upon entering the hospital/clinic. If you do not have a mask, one will be given to you upon arrival. For doctor visits, patients may have 1 support person aged 87 or older with them. For treatment visits, patients cannot have anyone with them due to current Covid guidelines and our immunocompromised population.

## 2021-07-02 NOTE — Progress Notes (Signed)
[facility]   Telephone:(336) [phone] Fax:([phone]) [phone]   Clinic Follow up Note   Patient Care Team: Pcp, No as PCP - Sherryl Manges, MD as Consulting Physician (Hematology and Oncology) 07/02/2021  CHIEF COMPLAINT: Stage IV left breast cancer, ER+ / PR- / Her2+, grade 3  SUMMARY OF ONCOLOGIC HISTORY: Oncology History  Bone metastases  02/27/2020 Initial Diagnosis   Bone metastases ([place])   Carcinoma of breast metastatic to bone ([place])  03/19/2020 Initial Diagnosis   Carcinoma of breast metastatic to bone ([place])   03/24/2020 -  Chemotherapy   Patient is on Treatment Plan : BREAST Trastuzumab q21d     04/15/2020 Cancer Staging   Staging form: Breast, AJCC 8th Edition - Clinical stage from 04/15/2020: Stage IV (cTX, cNX, cM1, GX, ER+, PR-, HER2+) - Signed by [name], MD on 04/15/2020 Stage prefix: Initial diagnosis Histologic grading system: 3 grade system     CURRENT THERAPY:  Anastrozole 1 mg p.o. daily Herceptin/Kanjinti q3 weeks Zometa q3 months  INTERVAL HISTORY:   Ms. Donelan returns for follow-up after her recent imaging results.  She complains of cough since she started the cardiac medication.  She stopped it for 2-1/2 weeks and her cough has resolved completely.  Besides cough she denies any new complaints.  She continues to feel strong.  No chest pain, chest pressure or shortness of breath.  No change in bowel habits or urinary habits.  No change in the breast mass.   Rest of the pertinent 10 point ROS reviewed and neg  MEDICAL HISTORY:  Past Medical History:  Diagnosis Date   Breast cancer ([place])    with bone metastasis   Family history of breast cancer 06/11/2021   Family history of kidney cancer 06/11/2021    SURGICAL HISTORY: Past Surgical History:  Procedure Laterality Date   IR PERC PLEURAL DRAIN W/INDWELL CATH W/IMG GUIDE  06/03/2020   IR RADIOLOGIST EVAL & MGMT  07/31/2020   IR REMOVAL OF PLURAL CATH W/CUFF  10/31/2020    I  have reviewed the social history and family history with the patient and they are unchanged from previous note.  ALLERGIES:  is allergic to vicodin hp [hydrocodone-acetaminophen].  MEDICATIONS:  Current Outpatient Medications  Medication Sig Dispense Refill   metFORMIN (GLUCOPHAGE) 500 MG tablet Take 1 tablet (500 mg total) by mouth daily with breakfast. 60 tablet 0   anastrozole (ARIMIDEX) 1 MG tablet Take 1 tablet (1 mg total) by mouth daily. 30 tablet 11   carvedilol (COREG) 6.25 MG tablet Take 1 tablet (6.25 mg total) by mouth 2 (two) times daily with a meal. 60 tablet 6   Chlorphen-Pseudoephed-APAP (TYLENOL ALLERGY SINUS PO) Take 1 tablet by mouth daily as needed (allergy).     oxymetazoline (AFRIN) 0.05 % nasal spray Place 1 spray into both nostrils daily as needed for congestion.     sacubitril-valsartan (ENTRESTO) 49-51 MG Take 1 tablet by mouth 2 (two) times daily. 60 tablet 6   No current facility-administered medications for this visit.   Facility-Administered Medications Ordered in Other Visits  Medication Dose Route Frequency Provider Last Rate Last Admin   0.9 %  sodium chloride infusion   Intravenous Once Peter Daquila, MD       trastuzumab-anns (KANJINTI) 336 mg in sodium chloride 0.9 % 250 mL chemo infusion  6 mg/kg (Order-Specific) Intravenous Once [name], MD       Review of Systems  Constitutional:  Negative for chills, fever, malaise/fatigue and weight  loss.  HENT:  Negative for sore throat.   Respiratory:  Negative for cough, shortness of breath and wheezing.   Cardiovascular:  Negative for chest pain, palpitations and leg swelling.  Gastrointestinal:  Negative for abdominal pain, blood in stool, constipation, diarrhea, melena, nausea and vomiting.  Musculoskeletal:  Negative for myalgias.  Skin:  Negative for itching and rash.    PHYSICAL EXAMINATION: ECOG PERFORMANCE STATUS: 0 - Asymptomatic  Vitals:   07/02/21 1341  BP: (!) 146/64  Pulse: 74   Temp: 97.7 F (36.5 C)  SpO2: 99%   Filed Weights   07/02/21 1341  Weight: 119 lb 3.2 oz (54.1 kg)   Physical Exam Constitutional:      Appearance: Normal appearance.  HENT:     Head: Normocephalic and atraumatic.  Cardiovascular:     Rate and Rhythm: Normal rate and regular rhythm.     Pulses: Normal pulses.     Heart sounds: Normal heart sounds.  Pulmonary:     Effort: Pulmonary effort is normal.     Breath sounds: Normal breath sounds.  Chest:     Comments: No change in the left breast.  No palpable axillary lymph nodes on exam today Abdominal:     General: Abdomen is flat. Bowel sounds are normal.  Musculoskeletal:        General: No swelling (LUE in Ace wrap, patient says swelling has decreased remarkably) or tenderness.  Skin:    General: Skin is warm and dry.  Neurological:     General: No focal deficit present.     Mental Status: She is alert.     LABORATORY DATA:  I have reviewed the data as listed    Latest Ref Rng & Units 07/02/2021    1:16 PM 06/11/2021    2:07 PM 05/21/2021    1:47 PM  CBC  WBC 4.0 - 10.5 K/uL 7.8   9.6   8.5    Hemoglobin 12.0 - 15.0 g/dL 9.8   9.8   9.9    Hematocrit 36.0 - 46.0 % 30.4   30.3   29.7    Platelets 150 - 400 K/uL 314   361   304          Latest Ref Rng & Units 07/02/2021    1:16 PM 06/11/2021   12:03 PM 05/21/2021    1:47 PM  CMP  Glucose 70 - 99 mg/dL 478   356   197    BUN 8 - 23 mg/dL 12   12   14     Creatinine 0.44 - 1.00 mg/dL 0.92   0.99   0.91    Sodium 135 - 145 mmol/L 130   132   136    Potassium 3.5 - 5.1 mmol/L 4.3   4.5   4.2    Chloride 98 - 111 mmol/L 94   96   102    CO2 22 - 32 mmol/L 29   29   28     Calcium 8.9 - 10.3 mg/dL 9.7   9.4   9.2    Total Protein 6.5 - 8.1 g/dL   7.3    Total Bilirubin 0.3 - 1.2 mg/dL   0.3    Alkaline Phos 38 - 126 U/L   106    AST 15 - 41 U/L   12    ALT 0 - 44 U/L   9        RADIOGRAPHIC STUDIES: I have personally reviewed the radiological images  as listed  and agreed with the findings in the report. No results found.   ASSESSMENT & PLAN: 81 year old female  # Metastatic breast cancer  -CT evidence of hepatic, osseous, pulmonary, and axillary nodal metastatic disease  -Biopsy 02/29/2020 left axillary/chest wall mass confirmed metastatic carcinoma consistent with breast primary, ER +50% weak, PR negative, HER2 positive 3+.  She is currently on trastuzumab every 21 days started in February 2022, anastrozole 1 mg p.o. daily and Zometa for bone strengthening. -Received palliative radiation to the left axilla from 05/28/2020-06/25/2020 -Last echocardiogram showed an ejection fraction drop of almost 20 to 25%.  We held trastuzumab and referred her to cardiology for further recommendations. Dr. Haroldine Laws has started her on carvedilol and Entresto for cardioprotection.   --Repeat echo in February showed EF of 55 to 60% -I have discussed with cardiology and they are okay with [place] resuming Herceptin.   --She has started Herceptin back in February and continues to tolerate it well. Most recent imaging however discussed about the mild progression of disease especially with her pulmonary metastasis.  We have reviewed this imaging and compared it to the December imaging.  I do agree there is some progression based on her imaging.  We have discussed about continuing 2 more cycles of Herceptin and repeating imaging in 6 weeks versus adding pertuzumab to her current regimen.  She is very worried about diarrhea from pertuzumab and would rather continue Herceptin and repeat imaging in a short duration.  I think this is certainly reasonable given her age and given her preference for good quality of life.  # bone health awaiting completion of dental procedures,  #LUE Lymphedema: with significant improvement.  #Hyperglycemia without any anion gap.  She does not have a PCP at this time.  Have started her on metformin 500 mg daily, once again discussed about adverse effects of  metformin.  She arrived today with her godchild. I recommended if her family members physician will take over her care as well.  She was strongly encouraged to establish with a primary care.  They were asked to contact us if for any reason they are unable to establish with a primary care.  Once again she can certainly go to the nearest hospital with any new or worsening concerns and she expressed understanding Total time spent: 40 minutes  All questions were answered. The patient knows to call the clinic with any problems, questions or concerns. No barriers to learning were detected.  I have spent a total of 40 minutes minutes of face-to-face and non-face-to-face time, preparing to see the patient, performing a medically appropriate examination, counseling and educating the patient, referring and communicating with other health care professionals, documenting clinical information in the electronic health record, and care coordination.    [name], MD Hematology and [facility] at [facility]: [phone]

## 2021-07-06 ENCOUNTER — Other Ambulatory Visit (HOSPITAL_COMMUNITY): Payer: Self-pay | Admitting: *Deleted

## 2021-07-06 MED ORDER — LOSARTAN POTASSIUM 50 MG PO TABS: 50.0000 mg | ORAL_TABLET | Freq: Every day | ORAL | 3 refills | Status: AC

## 2021-07-06 NOTE — Telephone Encounter (Signed)
Medication sent.

## 2021-07-08 ENCOUNTER — Encounter: Payer: Self-pay | Admitting: Genetic Counselor

## 2021-07-08 ENCOUNTER — Telehealth: Payer: Self-pay | Admitting: Genetic Counselor

## 2021-07-08 DIAGNOSIS — Z1379 Encounter for other screening for genetic and chromosomal anomalies: Secondary | ICD-10-CM | POA: Insufficient documentation

## 2021-07-08 NOTE — Telephone Encounter (Signed)
Contacted patient in attempt to disclose results of genetic testing.  LVM with contact information requesting a call back.

## 2021-07-10 ENCOUNTER — Telehealth: Payer: Self-pay | Admitting: *Deleted

## 2021-07-10 ENCOUNTER — Other Ambulatory Visit: Payer: Self-pay | Admitting: *Deleted

## 2021-07-10 DIAGNOSIS — C50919 Malignant neoplasm of unspecified site of unspecified female breast: Secondary | ICD-10-CM

## 2021-07-10 DIAGNOSIS — R739 Hyperglycemia, unspecified: Secondary | ICD-10-CM

## 2021-07-10 NOTE — Telephone Encounter (Addendum)
-----   Message from [name], MD sent at 07/03/2021 11:56 PM EDT ----- Tivis Ringer, can you ask the patient if she could get hold of any primary doc, if not can we send her stat to one of our PCP's Can she return to lab for repeat BMP next week   This RN spoke with pt per noted continued increase in blood sugar- with pt stating she feels reading was made worse due to " I bought this muscadine grape juice and it is so good and I had probably 8 or 9 glasses and then I also had 3 Dr Samson Frederic "  " I do not want to start on the metformin yet if I do not really need to and just because my number is up because I over did the muscadine juice and soda"  This RN validated her concerns but also concern that level can lead to more severe issues.  Plan - pt is monitoring sugar intake and will not drink the Muscadine juice. She will arrange transportation for fasting lab next week for further work up with understanding of need for fasting labs.  Talecia verbalized understanding.  Appt made by this RN.

## 2021-07-14 DIAGNOSIS — I081 Rheumatic disorders of both mitral and tricuspid valves: Secondary | ICD-10-CM | POA: Diagnosis not present

## 2021-07-14 DIAGNOSIS — C787 Secondary malignant neoplasm of liver and intrahepatic bile duct: Secondary | ICD-10-CM | POA: Diagnosis not present

## 2021-07-14 DIAGNOSIS — C7800 Secondary malignant neoplasm of unspecified lung: Secondary | ICD-10-CM | POA: Diagnosis not present

## 2021-07-14 DIAGNOSIS — T451X5A Adverse effect of antineoplastic and immunosuppressive drugs, initial encounter: Secondary | ICD-10-CM | POA: Diagnosis not present

## 2021-07-14 DIAGNOSIS — H04123 Dry eye syndrome of bilateral lacrimal glands: Secondary | ICD-10-CM | POA: Diagnosis not present

## 2021-07-14 DIAGNOSIS — H26492 Other secondary cataract, left eye: Secondary | ICD-10-CM | POA: Diagnosis not present

## 2021-07-14 DIAGNOSIS — Y929 Unspecified place or not applicable: Secondary | ICD-10-CM | POA: Diagnosis not present

## 2021-07-14 DIAGNOSIS — I10 Essential (primary) hypertension: Secondary | ICD-10-CM | POA: Diagnosis not present

## 2021-07-14 DIAGNOSIS — J341 Cyst and mucocele of nose and nasal sinus: Secondary | ICD-10-CM | POA: Diagnosis not present

## 2021-07-14 DIAGNOSIS — C7951 Secondary malignant neoplasm of bone: Secondary | ICD-10-CM | POA: Diagnosis not present

## 2021-07-14 DIAGNOSIS — C50919 Malignant neoplasm of unspecified site of unspecified female breast: Secondary | ICD-10-CM | POA: Diagnosis not present

## 2021-07-14 DIAGNOSIS — C7931 Secondary malignant neoplasm of brain: Secondary | ICD-10-CM | POA: Diagnosis not present

## 2021-07-14 DIAGNOSIS — Z170 Estrogen receptor positive status [ER+]: Secondary | ICD-10-CM | POA: Diagnosis not present

## 2021-07-14 DIAGNOSIS — Z79811 Long term (current) use of aromatase inhibitors: Secondary | ICD-10-CM | POA: Diagnosis not present

## 2021-07-14 DIAGNOSIS — R69 Illness, unspecified: Secondary | ICD-10-CM | POA: Diagnosis not present

## 2021-07-14 DIAGNOSIS — G9389 Other specified disorders of brain: Secondary | ICD-10-CM | POA: Diagnosis not present

## 2021-07-14 DIAGNOSIS — I427 Cardiomyopathy due to drug and external agent: Secondary | ICD-10-CM | POA: Diagnosis not present

## 2021-07-14 DIAGNOSIS — H5462 Unqualified visual loss, left eye, normal vision right eye: Secondary | ICD-10-CM | POA: Diagnosis not present

## 2021-07-14 DIAGNOSIS — Z79899 Other long term (current) drug therapy: Secondary | ICD-10-CM | POA: Diagnosis not present

## 2021-07-14 DIAGNOSIS — E119 Type 2 diabetes mellitus without complications: Secondary | ICD-10-CM | POA: Diagnosis not present

## 2021-07-14 DIAGNOSIS — F419 Anxiety disorder, unspecified: Secondary | ICD-10-CM | POA: Diagnosis not present

## 2021-07-14 DIAGNOSIS — Z7984 Long term (current) use of oral hypoglycemic drugs: Secondary | ICD-10-CM | POA: Diagnosis not present

## 2021-07-14 DIAGNOSIS — D509 Iron deficiency anemia, unspecified: Secondary | ICD-10-CM | POA: Diagnosis not present

## 2021-07-14 DIAGNOSIS — H3412 Central retinal artery occlusion, left eye: Secondary | ICD-10-CM | POA: Diagnosis not present

## 2021-07-15 DIAGNOSIS — C773 Secondary and unspecified malignant neoplasm of axilla and upper limb lymph nodes: Secondary | ICD-10-CM | POA: Diagnosis not present

## 2021-07-15 DIAGNOSIS — D539 Nutritional anemia, unspecified: Secondary | ICD-10-CM | POA: Diagnosis not present

## 2021-07-15 DIAGNOSIS — R739 Hyperglycemia, unspecified: Secondary | ICD-10-CM | POA: Diagnosis not present

## 2021-07-15 DIAGNOSIS — C787 Secondary malignant neoplasm of liver and intrahepatic bile duct: Secondary | ICD-10-CM | POA: Diagnosis not present

## 2021-07-15 DIAGNOSIS — I10 Essential (primary) hypertension: Secondary | ICD-10-CM | POA: Diagnosis not present

## 2021-07-15 DIAGNOSIS — H479 Unspecified disorder of visual pathways: Secondary | ICD-10-CM | POA: Diagnosis not present

## 2021-07-15 DIAGNOSIS — C7951 Secondary malignant neoplasm of bone: Secondary | ICD-10-CM | POA: Diagnosis not present

## 2021-07-15 DIAGNOSIS — Z170 Estrogen receptor positive status [ER+]: Secondary | ICD-10-CM | POA: Diagnosis not present

## 2021-07-15 DIAGNOSIS — G9389 Other specified disorders of brain: Secondary | ICD-10-CM | POA: Diagnosis not present

## 2021-07-15 DIAGNOSIS — H5462 Unqualified visual loss, left eye, normal vision right eye: Secondary | ICD-10-CM | POA: Diagnosis not present

## 2021-07-15 DIAGNOSIS — I358 Other nonrheumatic aortic valve disorders: Secondary | ICD-10-CM | POA: Diagnosis not present

## 2021-07-15 DIAGNOSIS — H3412 Central retinal artery occlusion, left eye: Secondary | ICD-10-CM | POA: Diagnosis not present

## 2021-07-15 DIAGNOSIS — C50919 Malignant neoplasm of unspecified site of unspecified female breast: Secondary | ICD-10-CM | POA: Diagnosis not present

## 2021-07-15 DIAGNOSIS — C7800 Secondary malignant neoplasm of unspecified lung: Secondary | ICD-10-CM | POA: Diagnosis not present

## 2021-07-16 ENCOUNTER — Other Ambulatory Visit: Payer: Self-pay | Admitting: Lab

## 2021-07-16 ENCOUNTER — Telehealth: Payer: Self-pay

## 2021-07-16 ENCOUNTER — Inpatient Hospital Stay: Payer: Medicare HMO

## 2021-07-16 ENCOUNTER — Other Ambulatory Visit: Payer: Self-pay

## 2021-07-16 DIAGNOSIS — Z79899 Other long term (current) drug therapy: Secondary | ICD-10-CM | POA: Diagnosis not present

## 2021-07-16 DIAGNOSIS — Z79811 Long term (current) use of aromatase inhibitors: Secondary | ICD-10-CM | POA: Diagnosis not present

## 2021-07-16 DIAGNOSIS — C7931 Secondary malignant neoplasm of brain: Secondary | ICD-10-CM | POA: Diagnosis not present

## 2021-07-16 DIAGNOSIS — Z170 Estrogen receptor positive status [ER+]: Secondary | ICD-10-CM | POA: Diagnosis not present

## 2021-07-16 DIAGNOSIS — R197 Diarrhea, unspecified: Secondary | ICD-10-CM | POA: Diagnosis not present

## 2021-07-16 DIAGNOSIS — C7800 Secondary malignant neoplasm of unspecified lung: Secondary | ICD-10-CM | POA: Diagnosis not present

## 2021-07-16 DIAGNOSIS — R739 Hyperglycemia, unspecified: Secondary | ICD-10-CM | POA: Diagnosis not present

## 2021-07-16 DIAGNOSIS — C50919 Malignant neoplasm of unspecified site of unspecified female breast: Secondary | ICD-10-CM

## 2021-07-16 DIAGNOSIS — Z66 Do not resuscitate: Secondary | ICD-10-CM | POA: Diagnosis not present

## 2021-07-16 DIAGNOSIS — C50911 Malignant neoplasm of unspecified site of right female breast: Secondary | ICD-10-CM | POA: Diagnosis not present

## 2021-07-16 DIAGNOSIS — Z5112 Encounter for antineoplastic immunotherapy: Secondary | ICD-10-CM | POA: Diagnosis not present

## 2021-07-16 DIAGNOSIS — C787 Secondary malignant neoplasm of liver and intrahepatic bile duct: Secondary | ICD-10-CM | POA: Diagnosis not present

## 2021-07-16 DIAGNOSIS — I890 Lymphedema, not elsewhere classified: Secondary | ICD-10-CM | POA: Diagnosis not present

## 2021-07-16 DIAGNOSIS — Z803 Family history of malignant neoplasm of breast: Secondary | ICD-10-CM | POA: Diagnosis not present

## 2021-07-16 DIAGNOSIS — C7951 Secondary malignant neoplasm of bone: Secondary | ICD-10-CM | POA: Diagnosis not present

## 2021-07-16 DIAGNOSIS — C773 Secondary and unspecified malignant neoplasm of axilla and upper limb lymph nodes: Secondary | ICD-10-CM | POA: Diagnosis not present

## 2021-07-16 DIAGNOSIS — Z923 Personal history of irradiation: Secondary | ICD-10-CM | POA: Diagnosis not present

## 2021-07-16 DIAGNOSIS — Z8052 Family history of malignant neoplasm of bladder: Secondary | ICD-10-CM | POA: Diagnosis not present

## 2021-07-16 LAB — CBC WITH DIFFERENTIAL/PLATELET
Abs Immature Granulocytes: 0.07 10*3/uL (ref 0.00–0.07)
Basophils Absolute: 0 10*3/uL (ref 0.0–0.1)
Basophils Relative: 0 %
Eosinophils Absolute: 0 10*3/uL (ref 0.0–0.5)
Eosinophils Relative: 1 %
HCT: 29.2 % — ABNORMAL LOW (ref 36.0–46.0)
Hemoglobin: 9.3 g/dL — ABNORMAL LOW (ref 12.0–15.0)
Immature Granulocytes: 1 %
Lymphocytes Relative: 17 %
Lymphs Abs: 1.2 10*3/uL (ref 0.7–4.0)
MCH: 23.8 pg — ABNORMAL LOW (ref 26.0–34.0)
MCHC: 31.8 g/dL (ref 30.0–36.0)
MCV: 74.9 fL — ABNORMAL LOW (ref 80.0–100.0)
Monocytes Absolute: 1.1 10*3/uL — ABNORMAL HIGH (ref 0.1–1.0)
Monocytes Relative: 16 %
Neutro Abs: 4.6 10*3/uL (ref 1.7–7.7)
Neutrophils Relative %: 65 %
Platelets: 314 10*3/uL (ref 150–400)
RBC: 3.9 MIL/uL (ref 3.87–5.11)
RDW: 15.4 % (ref 11.5–15.5)
WBC: 7 10*3/uL (ref 4.0–10.5)
nRBC: 0 % (ref 0.0–0.2)

## 2021-07-16 LAB — CMP (CANCER CENTER ONLY)
ALT: 5 U/L (ref 0–44)
AST: 9 U/L — ABNORMAL LOW (ref 15–41)
Albumin: 3.2 g/dL — ABNORMAL LOW (ref 3.5–5.0)
Alkaline Phosphatase: 96 U/L (ref 38–126)
Anion gap: 6 (ref 5–15)
BUN: 11 mg/dL (ref 8–23)
CO2: 30 mmol/L (ref 22–32)
Calcium: 9.3 mg/dL (ref 8.9–10.3)
Chloride: 98 mmol/L (ref 98–111)
Creatinine: 1 mg/dL (ref 0.44–1.00)
GFR, Estimated: 57 mL/min — ABNORMAL LOW (ref >60)
Glucose, Bld: 222 mg/dL — ABNORMAL HIGH (ref 70–99)
Potassium: 3.9 mmol/L (ref 3.5–5.1)
Sodium: 134 mmol/L — ABNORMAL LOW (ref 135–145)
Total Bilirubin: 0.4 mg/dL (ref 0.3–1.2)
Total Protein: 7 g/dL (ref 6.5–8.1)

## 2021-07-16 LAB — HEMOGLOBIN A1C
Hgb A1c MFr Bld: 10.1 % — ABNORMAL HIGH (ref 4.8–5.6)
Mean Plasma Glucose: 243.17 mg/dL

## 2021-07-16 NOTE — Telephone Encounter (Signed)
Received call from Rexene Edison - Nurse Navigator at [facility]. Patient discharged from hospital today with the findings below: -MRI brain wwo demonstrates multifocal supratentorial and infratentorial brain mets with some vasogenic edema but no significant mass effect. Discussed treatment options with patient and she prefers to f/u with her primary oncologist for referral to [place].  Left message on patient's VM confirming apt with Dr. Chryl Heck on 07/25/22.  Routed message to Dr. Chryl Heck and Hinda Lenis RN

## 2021-07-24 ENCOUNTER — Telehealth: Payer: Self-pay | Admitting: Radiation Oncology

## 2021-07-24 ENCOUNTER — Other Ambulatory Visit: Payer: Self-pay

## 2021-07-24 ENCOUNTER — Inpatient Hospital Stay: Payer: Medicare HMO

## 2021-07-24 ENCOUNTER — Inpatient Hospital Stay: Payer: Medicare HMO | Admitting: Hematology and Oncology

## 2021-07-24 ENCOUNTER — Other Ambulatory Visit: Payer: Self-pay | Admitting: *Deleted

## 2021-07-24 VITALS — BP 154/70 | HR 74 | Temp 97.9°F | Resp 16 | Ht 61.0 in | Wt 115.1 lb

## 2021-07-24 DIAGNOSIS — C50919 Malignant neoplasm of unspecified site of unspecified female breast: Secondary | ICD-10-CM | POA: Diagnosis not present

## 2021-07-24 DIAGNOSIS — Z79811 Long term (current) use of aromatase inhibitors: Secondary | ICD-10-CM | POA: Diagnosis not present

## 2021-07-24 DIAGNOSIS — Z79899 Other long term (current) drug therapy: Secondary | ICD-10-CM | POA: Diagnosis not present

## 2021-07-24 DIAGNOSIS — Z170 Estrogen receptor positive status [ER+]: Secondary | ICD-10-CM | POA: Diagnosis not present

## 2021-07-24 DIAGNOSIS — Z8052 Family history of malignant neoplasm of bladder: Secondary | ICD-10-CM | POA: Diagnosis not present

## 2021-07-24 DIAGNOSIS — Z923 Personal history of irradiation: Secondary | ICD-10-CM | POA: Diagnosis not present

## 2021-07-24 DIAGNOSIS — I890 Lymphedema, not elsewhere classified: Secondary | ICD-10-CM | POA: Diagnosis not present

## 2021-07-24 DIAGNOSIS — Z66 Do not resuscitate: Secondary | ICD-10-CM | POA: Diagnosis not present

## 2021-07-24 DIAGNOSIS — C50912 Malignant neoplasm of unspecified site of left female breast: Secondary | ICD-10-CM

## 2021-07-24 DIAGNOSIS — C7931 Secondary malignant neoplasm of brain: Secondary | ICD-10-CM | POA: Diagnosis not present

## 2021-07-24 DIAGNOSIS — R197 Diarrhea, unspecified: Secondary | ICD-10-CM | POA: Diagnosis not present

## 2021-07-24 DIAGNOSIS — Z5112 Encounter for antineoplastic immunotherapy: Secondary | ICD-10-CM | POA: Diagnosis not present

## 2021-07-24 DIAGNOSIS — Z803 Family history of malignant neoplasm of breast: Secondary | ICD-10-CM | POA: Diagnosis not present

## 2021-07-24 DIAGNOSIS — R739 Hyperglycemia, unspecified: Secondary | ICD-10-CM | POA: Diagnosis not present

## 2021-07-24 DIAGNOSIS — C787 Secondary malignant neoplasm of liver and intrahepatic bile duct: Secondary | ICD-10-CM | POA: Diagnosis not present

## 2021-07-24 DIAGNOSIS — C7951 Secondary malignant neoplasm of bone: Secondary | ICD-10-CM | POA: Diagnosis not present

## 2021-07-24 DIAGNOSIS — C50911 Malignant neoplasm of unspecified site of right female breast: Secondary | ICD-10-CM | POA: Diagnosis not present

## 2021-07-24 DIAGNOSIS — C7800 Secondary malignant neoplasm of unspecified lung: Secondary | ICD-10-CM | POA: Diagnosis not present

## 2021-07-24 DIAGNOSIS — C773 Secondary and unspecified malignant neoplasm of axilla and upper limb lymph nodes: Secondary | ICD-10-CM | POA: Diagnosis not present

## 2021-07-24 LAB — BASIC METABOLIC PANEL - CANCER CENTER ONLY
Anion gap: 10 (ref 5–15)
BUN: 13 mg/dL (ref 8–23)
CO2: 25 mmol/L (ref 22–32)
Calcium: 9.4 mg/dL (ref 8.9–10.3)
Chloride: 100 mmol/L (ref 98–111)
Creatinine: 0.86 mg/dL (ref 0.44–1.00)
GFR, Estimated: >60 mL/min (ref >60)
Glucose, Bld: 255 mg/dL — ABNORMAL HIGH (ref 70–99)
Potassium: 4.1 mmol/L (ref 3.5–5.1)
Sodium: 135 mmol/L (ref 135–145)

## 2021-07-24 LAB — CBC WITH DIFFERENTIAL/PLATELET
Abs Immature Granulocytes: 0.05 10*3/uL (ref 0.00–0.07)
Basophils Absolute: 0 10*3/uL (ref 0.0–0.1)
Basophils Relative: 0 %
Eosinophils Absolute: 0 10*3/uL (ref 0.0–0.5)
Eosinophils Relative: 0 %
HCT: 30.2 % — ABNORMAL LOW (ref 36.0–46.0)
Hemoglobin: 9.8 g/dL — ABNORMAL LOW (ref 12.0–15.0)
Immature Granulocytes: 1 %
Lymphocytes Relative: 13 %
Lymphs Abs: 1 10*3/uL (ref 0.7–4.0)
MCH: 23.7 pg — ABNORMAL LOW (ref 26.0–34.0)
MCHC: 32.5 g/dL (ref 30.0–36.0)
MCV: 72.9 fL — ABNORMAL LOW (ref 80.0–100.0)
Monocytes Absolute: 1 10*3/uL (ref 0.1–1.0)
Monocytes Relative: 13 %
Neutro Abs: 5.7 10*3/uL (ref 1.7–7.7)
Neutrophils Relative %: 73 %
Platelets: 281 10*3/uL (ref 150–400)
RBC: 4.14 MIL/uL (ref 3.87–5.11)
RDW: 15.6 % — ABNORMAL HIGH (ref 11.5–15.5)
WBC: 7.8 10*3/uL (ref 4.0–10.5)
nRBC: 0 % (ref 0.0–0.2)

## 2021-07-24 MED ORDER — DEXAMETHASONE 2 MG PO TABS: 2.0000 mg | ORAL_TABLET | Freq: Every day | ORAL | 0 refills | Status: DC

## 2021-07-24 NOTE — Telephone Encounter (Signed)
Called patient to schedule a consultation w. Dr. Kinard. No answer, LVM for a return call.  

## 2021-07-24 NOTE — Progress Notes (Signed)
[facility]   Telephone:([phone] Fax:(336) [phone]   Clinic Follow up Note   Patient Care Team: Pcp, No as PCP - Morgan Deforest, MD as Consulting Physician (Hematology and Oncology) 07/24/2021  CHIEF COMPLAINT: Stage IV left breast cancer, ER+ / PR- / Her2+, grade 3  SUMMARY OF ONCOLOGIC HISTORY: Oncology History  Bone metastases  02/27/2020 Initial Diagnosis   Bone metastases (HCC)   Carcinoma of breast metastatic to bone (HCC)  03/19/2020 Initial Diagnosis   Carcinoma of breast metastatic to bone (HCC)   03/24/2020 -  Chemotherapy   Patient is on Treatment Plan : BREAST Trastuzumab q21d     04/15/2020 Cancer Staging   Staging form: Breast, AJCC 8th Edition - Clinical stage from 04/15/2020: Stage IV (cTX, cNX, cM1, GX, ER+, PR-, HER2+) - Signed by Rachel Moulds, MD on 04/15/2020 Stage prefix: Initial diagnosis Histologic grading system: 3 grade system   07/08/2021 Genetic Testing   Negative hereditary cancer genetic testing: no pathogenic variants detected in [facility] CancerNext-Expanded +RNAinsight Panel.  Variant of uncertain significance detected in RET at  p.Y1015H (c.3043T>C).  Report date is July 08, 2021.  The CancerNext-Expanded gene panel offered by [facility] and includes sequencing, rearrangement, and RNA analysis for the following 77 genes: AIP, ALK, APC, ATM, AXIN2, BAP1, BARD1, BLM, BMPR1A, BRCA1, BRCA2, BRIP1, CDC73, CDH1, CDK4, CDKN1B, CDKN2A, CHEK2, CTNNA1, DICER1, FANCC, FH, FLCN, GALNT12, KIF1B, LZTR1, MAX, MEN1, MET, MLH1, MSH2, MSH3, MSH6, MUTYH, NBN, NF1, NF2, NTHL1, PALB2, PHOX2B, PMS2, POT1, PRKAR1A, PTCH1, PTEN, RAD51C, RAD51D, RB1, RECQL, RET, SDHA, SDHAF2, SDHB, SDHC, SDHD, SMAD4, SMARCA4, SMARCB1, SMARCE1, STK11, SUFU, TMEM127, TP53, TSC1, TSC2, VHL and XRCC2 (sequencing and deletion/duplication); EGFR, EGLN1, HOXB13, KIT, MITF, PDGFRA, POLD1, and POLE (sequencing only); EPCAM and GREM1 (deletion/duplication only).         CURRENT THERAPY:  Anastrozole 1 mg p.o. daily Herceptin/Kanjinti q3 weeks Zometa q3 months  INTERVAL HISTORY:   Morgan Yu returns for follow-up.  Morgan Yu presented to ophthalmology with acute evaluation of central retinal artery occlusion. Morgan Yu was recently seen at the ED [facility] with new bilateral visual deficit.  Morgan Yu had CT head in the ER which showed multifocal hyperattenuating foci which could be metastasis. Morgan Yu had MRI which showed multifocal supratentorial and infratentorial brain mets with some vasogenic edema but no significant mass effect. Morgan Yu wanted to return to [place] for further guidance. Morgan Yu absolutely denies any headaches, weakness or incontinence issues.  Morgan Yu states the vision has actually improved, Morgan Yu is able to function in her home okay, fix herself a sandwich but Morgan Yu cannot drive.  Morgan Yu denies any other pain.  Rest of the pertinent 10 point ROS reviewed and negative.   MEDICAL HISTORY:  Past Medical History:  Diagnosis Date   Breast cancer [facility])    with bone metastasis   Family history of breast cancer 06/11/2021   Family history of kidney cancer 06/11/2021    SURGICAL HISTORY: Past Surgical History:  Procedure Laterality Date   IR PERC PLEURAL DRAIN W/INDWELL CATH W/IMG GUIDE  06/03/2020   IR RADIOLOGIST EVAL & MGMT  07/31/2020   IR REMOVAL OF PLURAL CATH W/CUFF  10/31/2020    I have reviewed the social history and family history with the patient and they are unchanged from previous note.  ALLERGIES:  is allergic to vicodin hp [hydrocodone-acetaminophen].  MEDICATIONS:  Current Outpatient Medications  Medication Sig Dispense Refill   dexamethasone (DECADRON) 2 MG tablet Take 1 tablet (2 mg total) by mouth daily. 10  tablet 0   anastrozole (ARIMIDEX) 1 MG tablet Take 1 tablet (1 mg total) by mouth daily. 30 tablet 11   carvedilol (COREG) 6.25 MG tablet Take 1 tablet (6.25 mg total) by mouth 2 (two) times daily with a meal. 60 tablet 6   Chlorphen-Pseudoephed-APAP  (TYLENOL ALLERGY SINUS PO) Take 1 tablet by mouth daily as needed (allergy).     losartan (COZAAR) 50 MG tablet Take 1 tablet (50 mg total) by mouth daily. 30 tablet 3   metFORMIN (GLUCOPHAGE) 500 MG tablet Take 1 tablet (500 mg total) by mouth daily with breakfast. 60 tablet 0   oxymetazoline (AFRIN) 0.05 % nasal spray Place 1 spray into both nostrils daily as needed for congestion.     No current facility-administered medications for this visit.   Review of Systems  Constitutional:  Negative for chills, fever, malaise/fatigue and weight loss.  HENT:  Negative for sore throat.   Respiratory:  Negative for cough, shortness of breath and wheezing.   Cardiovascular:  Negative for chest pain, palpitations and leg swelling.  Gastrointestinal:  Negative for abdominal pain, blood in stool, constipation, diarrhea, melena, nausea and vomiting.  Musculoskeletal:  Negative for myalgias.  Skin:  Negative for itching and rash.     PHYSICAL EXAMINATION: ECOG PERFORMANCE STATUS: 0 - Asymptomatic  Vitals:   07/24/21 1136  BP: (!) 154/70  Pulse: 74  Resp: 16  Temp: 97.9 F (36.6 C)  SpO2: 97%   Filed Weights   07/24/21 1136  Weight: 115 lb 1.6 oz (52.2 kg)   Physical Exam Constitutional:      Appearance: Normal appearance.     Comments: Morgan Yu was able to ambulate to the examination table today without much help.  HENT:     Head: Normocephalic and atraumatic.  Cardiovascular:     Rate and Rhythm: Normal rate and regular rhythm.     Pulses: Normal pulses.     Heart sounds: Normal heart sounds.  Pulmonary:     Effort: Pulmonary effort is normal.     Breath sounds: Normal breath sounds.  Chest:     Comments: No change in the left breast.  No palpable axillary lymph nodes on exam today Abdominal:     General: Abdomen is flat. Bowel sounds are normal.  Musculoskeletal:        General: No swelling (LUE in Ace wrap, patient says swelling has decreased remarkably) or tenderness.  Skin:     General: Skin is warm and dry.  Neurological:     Mental Status: Morgan Yu is alert.     Motor: No weakness.     Gait: Gait normal.      LABORATORY DATA:  I have reviewed the data as listed    Latest Ref Rng & Units 07/24/2021   11:31 AM 07/16/2021   10:39 AM 07/02/2021    1:16 PM  CBC  WBC 4.0 - 10.5 K/uL 7.8  7.0  7.8   Hemoglobin 12.0 - 15.0 g/dL 9.8  9.3  9.8   Hematocrit 36.0 - 46.0 % 30.2  29.2  30.4   Platelets 150 - 400 K/uL 281  314  314         Latest Ref Rng & Units 07/24/2021   11:31 AM 07/16/2021   10:39 AM 07/02/2021    1:16 PM  CMP  Glucose 70 - 99 mg/dL 782  956  213   BUN 8 - 23 mg/dL 13  11  12    Creatinine 0.44 - 1.00  mg/dL 6.96  2.95  2.84   Sodium 135 - 145 mmol/L 135  134  130   Potassium 3.5 - 5.1 mmol/L 4.1  3.9  4.3   Chloride 98 - 111 mmol/L 100  98  94   CO2 22 - 32 mmol/L 25  30  29    Calcium 8.9 - 10.3 mg/dL 9.4  9.3  9.7   Total Protein 6.5 - 8.1 g/dL  7.0    Total Bilirubin 0.3 - 1.2 mg/dL  0.4    Alkaline Phos 38 - 126 U/L  96    AST 15 - 41 U/L  9    ALT 0 - 44 U/L  5        RADIOGRAPHIC STUDIES: I have personally reviewed the radiological images as listed and agreed with the findings in the report. No results found.   ASSESSMENT & PLAN: 81 year old female  # Metastatic breast cancer  -CT evidence of hepatic, osseous, pulmonary, and axillary nodal metastatic disease  -Biopsy 02/29/2020 left axillary/chest wall mass confirmed metastatic carcinoma consistent with breast primary, ER +50% weak, PR negative, HER2 positive 3+.  Morgan Yu is currently on trastuzumab every 21 days started in February 2022, anastrozole 1 mg p.o. daily and Zometa for bone strengthening. -Received palliative radiation to the left axilla from 05/28/2020-06/25/2020 -Last echocardiogram showed an ejection fraction drop of almost 20 to 25%.  We held trastuzumab and referred her to cardiology for further recommendations. Dr. Gala Romney has started her on carvedilol and Entresto for  cardioprotection.   --Repeat echo in February showed EF of 55 to 60% -I have discussed with cardiology and they are okay with [place] resuming Herceptin.   --Morgan Yu has started Herceptin back in February and continues to tolerate it well. --Last systemic imaging showed some concern for systemic progression.  Morgan Yu is also had some visual deficit hence was seen by of ophthalmology for sudden onset central retinal artery occlusion.  CT head demonstrated brain metastasis.  MRI also confirmed supratentorial and infratentorial brain metastasis with some vasogenic edema however Morgan Yu did not want to proceed with any treatment in [place], wanted to come back to see [place] for follow-up for further recommendations. Morgan Yu is not on any dexamethasone today.  Morgan Yu also has uncontrolled hyperglycemia which makes it difficult to administer high doses of dexamethasone however given vasogenic edema reported on the MR imaging, have started her on 2 mg once a day.  We have also reached out to Morgan Yu team if Morgan Yu can meet them to discuss about whole brain radiation.  Morgan Yu is not quite sure if Morgan Yu wants to go through whole brain radiation but wants to have that discussion. In the past as well as today, Morgan Yu is very reluctant about aggressive treatments.  Morgan Yu is very reluctant to compromise her quality of life.  We have discussed about considering hospice if Morgan Yu decides to not proceed with any treatment.  We have also discussed about the CODE STATUS, Morgan Yu wants to be DNR and does not want to be resuscitated.  I have once again discussed that Morgan Yu should start metformin,  get into PCPs office as soon as possible.  Had to hold Zometa from end of March because of some dental procedures.  Morgan Yu will return to clinic in 1 week for further recommendations All questions were answered. The patient knows to call the clinic with any problems, questions or concerns. No barriers to learning were detected.  I have spent a total of 40 minutes minutes of  face-to-face and non-face-to-face time, preparing to see the patient, performing a medically appropriate examination, counseling and educating the patient, referring and communicating with other health care professionals, documenting clinical information in the electronic health record, and care coordination.    Rachel Moulds, MD Hematology and Oncology [facility] at [place] P: [phone]

## 2021-07-27 ENCOUNTER — Other Ambulatory Visit: Payer: Self-pay

## 2021-07-27 ENCOUNTER — Encounter: Payer: Self-pay | Admitting: Radiation Oncology

## 2021-07-27 ENCOUNTER — Ambulatory Visit
Admission: RE | Admit: 2021-07-27 | Discharge: 2021-07-27 | Disposition: A | Discharge status: Home / Self Care | Payer: Medicare HMO | Source: Ambulatory Visit | Attending: Radiation Oncology | Admitting: Radiation Oncology

## 2021-07-27 ENCOUNTER — Ambulatory Visit
Admission: RE | Admit: 2021-07-27 | Discharge: 2021-07-27 | Disposition: A | Discharge status: Home / Self Care | Payer: Self-pay | Source: Ambulatory Visit | Attending: Radiation Oncology | Admitting: Radiation Oncology

## 2021-07-27 ENCOUNTER — Other Ambulatory Visit: Payer: Self-pay | Admitting: Oncology

## 2021-07-27 VITALS — BP 160/66 | HR 69 | Temp 97.8°F | Resp 18 | Ht 61.0 in | Wt 116.0 lb

## 2021-07-27 DIAGNOSIS — J90 Pleural effusion, not elsewhere classified: Secondary | ICD-10-CM | POA: Diagnosis not present

## 2021-07-27 DIAGNOSIS — Z7984 Long term (current) use of oral hypoglycemic drugs: Secondary | ICD-10-CM | POA: Diagnosis not present

## 2021-07-27 DIAGNOSIS — R918 Other nonspecific abnormal finding of lung field: Secondary | ICD-10-CM | POA: Diagnosis not present

## 2021-07-27 DIAGNOSIS — Z79811 Long term (current) use of aromatase inhibitors: Secondary | ICD-10-CM | POA: Diagnosis not present

## 2021-07-27 DIAGNOSIS — Z8051 Family history of malignant neoplasm of kidney: Secondary | ICD-10-CM | POA: Insufficient documentation

## 2021-07-27 DIAGNOSIS — C7931 Secondary malignant neoplasm of brain: Secondary | ICD-10-CM | POA: Diagnosis not present

## 2021-07-27 DIAGNOSIS — Z7952 Long term (current) use of systemic steroids: Secondary | ICD-10-CM | POA: Diagnosis not present

## 2021-07-27 DIAGNOSIS — C7951 Secondary malignant neoplasm of bone: Secondary | ICD-10-CM | POA: Diagnosis not present

## 2021-07-27 DIAGNOSIS — C787 Secondary malignant neoplasm of liver and intrahepatic bile duct: Secondary | ICD-10-CM | POA: Diagnosis not present

## 2021-07-27 DIAGNOSIS — Z170 Estrogen receptor positive status [ER+]: Secondary | ICD-10-CM | POA: Insufficient documentation

## 2021-07-27 DIAGNOSIS — C50919 Malignant neoplasm of unspecified site of unspecified female breast: Secondary | ICD-10-CM

## 2021-07-27 DIAGNOSIS — C778 Secondary and unspecified malignant neoplasm of lymph nodes of multiple regions: Secondary | ICD-10-CM | POA: Insufficient documentation

## 2021-07-27 DIAGNOSIS — Z803 Family history of malignant neoplasm of breast: Secondary | ICD-10-CM | POA: Insufficient documentation

## 2021-07-27 DIAGNOSIS — C50912 Malignant neoplasm of unspecified site of left female breast: Secondary | ICD-10-CM | POA: Insufficient documentation

## 2021-07-27 NOTE — Progress Notes (Signed)
Order for outside MRI done at [facility] on 07/15/21.

## 2021-07-28 ENCOUNTER — Inpatient Hospital Stay (HOSPITAL_BASED_OUTPATIENT_CLINIC_OR_DEPARTMENT_OTHER): Payer: Medicare HMO | Admitting: Hematology and Oncology

## 2021-07-28 ENCOUNTER — Encounter: Payer: Self-pay | Admitting: Hematology and Oncology

## 2021-07-28 VITALS — BP 182/69 | HR 74 | Temp 97.7°F | Resp 18 | Wt 115.4 lb

## 2021-07-28 DIAGNOSIS — C7931 Secondary malignant neoplasm of brain: Secondary | ICD-10-CM

## 2021-07-28 DIAGNOSIS — C773 Secondary and unspecified malignant neoplasm of axilla and upper limb lymph nodes: Secondary | ICD-10-CM | POA: Diagnosis not present

## 2021-07-28 DIAGNOSIS — Z79899 Other long term (current) drug therapy: Secondary | ICD-10-CM | POA: Diagnosis not present

## 2021-07-28 DIAGNOSIS — C7800 Secondary malignant neoplasm of unspecified lung: Secondary | ICD-10-CM | POA: Diagnosis not present

## 2021-07-28 DIAGNOSIS — C787 Secondary malignant neoplasm of liver and intrahepatic bile duct: Secondary | ICD-10-CM | POA: Diagnosis not present

## 2021-07-28 DIAGNOSIS — Z170 Estrogen receptor positive status [ER+]: Secondary | ICD-10-CM | POA: Diagnosis not present

## 2021-07-28 DIAGNOSIS — I890 Lymphedema, not elsewhere classified: Secondary | ICD-10-CM | POA: Diagnosis not present

## 2021-07-28 DIAGNOSIS — Z803 Family history of malignant neoplasm of breast: Secondary | ICD-10-CM | POA: Diagnosis not present

## 2021-07-28 DIAGNOSIS — Z79811 Long term (current) use of aromatase inhibitors: Secondary | ICD-10-CM | POA: Diagnosis not present

## 2021-07-28 DIAGNOSIS — R197 Diarrhea, unspecified: Secondary | ICD-10-CM | POA: Diagnosis not present

## 2021-07-28 DIAGNOSIS — Z8052 Family history of malignant neoplasm of bladder: Secondary | ICD-10-CM | POA: Diagnosis not present

## 2021-07-28 DIAGNOSIS — Z5112 Encounter for antineoplastic immunotherapy: Secondary | ICD-10-CM | POA: Diagnosis not present

## 2021-07-28 DIAGNOSIS — C50919 Malignant neoplasm of unspecified site of unspecified female breast: Secondary | ICD-10-CM | POA: Diagnosis not present

## 2021-07-28 DIAGNOSIS — Z66 Do not resuscitate: Secondary | ICD-10-CM | POA: Diagnosis not present

## 2021-07-28 DIAGNOSIS — C7951 Secondary malignant neoplasm of bone: Secondary | ICD-10-CM | POA: Diagnosis not present

## 2021-07-28 DIAGNOSIS — Z923 Personal history of irradiation: Secondary | ICD-10-CM | POA: Diagnosis not present

## 2021-07-28 DIAGNOSIS — R739 Hyperglycemia, unspecified: Secondary | ICD-10-CM | POA: Diagnosis not present

## 2021-07-28 DIAGNOSIS — C50911 Malignant neoplasm of unspecified site of right female breast: Secondary | ICD-10-CM | POA: Diagnosis not present

## 2021-07-28 NOTE — Progress Notes (Signed)
[facility]   Telephone:(336) [phone] Fax:(336) [phone]   Clinic Follow up Note   Patient Care Team: Pcp, No as PCP - Almyra Deforest, MD as Consulting Physician (Hematology and Oncology) 07/28/2021  CHIEF COMPLAINT: Stage IV left breast cancer, ER+ / PR- / Her2+, grade 3  SUMMARY OF ONCOLOGIC HISTORY: Oncology History  Bone metastases  02/27/2020 Initial Diagnosis   Bone metastases (HCC)   Carcinoma of breast metastatic to bone (HCC)  03/19/2020 Initial Diagnosis   Carcinoma of breast metastatic to bone (HCC)   03/24/2020 -  Chemotherapy   Patient is on Treatment Plan : BREAST Trastuzumab q21d     04/15/2020 Cancer Staging   Staging form: Breast, AJCC 8th Edition - Clinical stage from 04/15/2020: Stage IV (cTX, cNX, cM1, GX, ER+, PR-, HER2+) - Signed by Rachel Moulds, MD on 04/15/2020 Stage prefix: Initial diagnosis Histologic grading system: 3 grade system   07/08/2021 Genetic Testing   Negative hereditary cancer genetic testing: no pathogenic variants detected in [facility] CancerNext-Expanded +RNAinsight Panel.  Variant of uncertain significance detected in RET at  p.Y1015H (c.3043T>C).  Report date is July 08, 2021.  The CancerNext-Expanded gene panel offered by [facility] and includes sequencing, rearrangement, and RNA analysis for the following 77 genes: AIP, ALK, APC, ATM, AXIN2, BAP1, BARD1, BLM, BMPR1A, BRCA1, BRCA2, BRIP1, CDC73, CDH1, CDK4, CDKN1B, CDKN2A, CHEK2, CTNNA1, DICER1, FANCC, FH, FLCN, GALNT12, KIF1B, LZTR1, MAX, MEN1, MET, MLH1, MSH2, MSH3, MSH6, MUTYH, NBN, NF1, NF2, NTHL1, PALB2, PHOX2B, PMS2, POT1, PRKAR1A, PTCH1, PTEN, RAD51C, RAD51D, RB1, RECQL, RET, SDHA, SDHAF2, SDHB, SDHC, SDHD, SMAD4, SMARCA4, SMARCB1, SMARCE1, STK11, SUFU, TMEM127, TP53, TSC1, TSC2, VHL and XRCC2 (sequencing and deletion/duplication); EGFR, EGLN1, HOXB13, KIT, MITF, PDGFRA, POLD1, and POLE (sequencing only); EPCAM and GREM1 (deletion/duplication only).         CURRENT THERAPY:  Anastrozole 1 mg p.o. daily Herceptin/Kanjinti q3 weeks Zometa q3 months  INTERVAL HISTORY:   Ms. Geerdes returns for follow-up with Ms. Dondra Spry and her niece.  Since her last visit with her she met with Dr. Peri [name] and they have discussed about whole brain radiation therapy.  Family mentions that she had hallucinations on the dexamethasone which have since resolved.    She continues to deny any headaches, weakness or incontinence issues.  No pain.  Rest of the pertinent 10 point ROS reviewed and negative.   MEDICAL HISTORY:  Past Medical History:  Diagnosis Date   Breast cancer [facility])    with bone metastasis   Family history of breast cancer 06/11/2021   Family history of kidney cancer 06/11/2021    SURGICAL HISTORY: Past Surgical History:  Procedure Laterality Date   IR PERC PLEURAL DRAIN W/INDWELL CATH W/IMG GUIDE  06/03/2020   IR RADIOLOGIST EVAL & MGMT  07/31/2020   IR REMOVAL OF PLURAL CATH W/CUFF  10/31/2020    I have reviewed the social history and family history with the patient and they are unchanged from previous note.  ALLERGIES:  is allergic to vicodin hp [hydrocodone-acetaminophen].  MEDICATIONS:  Current Outpatient Medications  Medication Sig Dispense Refill   anastrozole (ARIMIDEX) 1 MG tablet Take 1 tablet (1 mg total) by mouth daily. 30 tablet 11   carvedilol (COREG) 6.25 MG tablet Take 1 tablet (6.25 mg total) by mouth 2 (two) times daily with a meal. 60 tablet 6   Chlorphen-Pseudoephed-APAP (TYLENOL ALLERGY SINUS PO) Take 1 tablet by mouth daily as needed (allergy).     dexamethasone (DECADRON) 2 MG tablet Take 1 tablet (  2 mg total) by mouth daily. 10 tablet 0   losartan (COZAAR) 50 MG tablet Take 1 tablet (50 mg total) by mouth daily. 30 tablet 3   metFORMIN (GLUCOPHAGE) 500 MG tablet Take 1 tablet (500 mg total) by mouth daily with breakfast. (Patient not taking: Reported on 07/27/2021) 60 tablet 0   oxymetazoline (AFRIN) 0.05 % nasal spray  Place 1 spray into both nostrils daily as needed for congestion.     No current facility-administered medications for this visit.   Review of Systems  Constitutional:  Negative for chills, fever, malaise/fatigue and weight loss.  HENT:  Negative for sore throat.   Respiratory:  Negative for cough, shortness of breath and wheezing.   Cardiovascular:  Negative for chest pain, palpitations and leg swelling.  Gastrointestinal:  Negative for abdominal pain, blood in stool, constipation, diarrhea, melena, nausea and vomiting.  Musculoskeletal:  Negative for myalgias.  Skin:  Negative for itching and rash.     PHYSICAL EXAMINATION: ECOG PERFORMANCE STATUS: 0 - Asymptomatic  Vitals:   07/28/21 1348  BP: (!) 182/69  Pulse: 74  Resp: 18  Temp: 97.7 F (36.5 C)  SpO2: 97%   Filed Weights   07/28/21 1348  Weight: 115 lb 7 oz (52.4 kg)    Physical exam deferred today, recently had an exam  LABORATORY DATA:  I have reviewed the data as listed    Latest Ref Rng & Units 07/24/2021   11:31 AM 07/16/2021   10:39 AM 07/02/2021    1:16 PM  CBC  WBC 4.0 - 10.5 K/uL 7.8  7.0  7.8   Hemoglobin 12.0 - 15.0 g/dL 9.8  9.3  9.8   Hematocrit 36.0 - 46.0 % 30.2  29.2  30.4   Platelets 150 - 400 K/uL 281  314  314         Latest Ref Rng & Units 07/24/2021   11:31 AM 07/16/2021   10:39 AM 07/02/2021    1:16 PM  CMP  Glucose 70 - 99 mg/dL 161  096  045   BUN 8 - 23 mg/dL 13  11  12    Creatinine 0.44 - 1.00 mg/dL 4.09  8.11  9.14   Sodium 135 - 145 mmol/L 135  134  130   Potassium 3.5 - 5.1 mmol/L 4.1  3.9  4.3   Chloride 98 - 111 mmol/L 100  98  94   CO2 22 - 32 mmol/L 25  30  29    Calcium 8.9 - 10.3 mg/dL 9.4  9.3  9.7   Total Protein 6.5 - 8.1 g/dL  7.0    Total Bilirubin 0.3 - 1.2 mg/dL  0.4    Alkaline Phos 38 - 126 U/L  96    AST 15 - 41 U/L  9    ALT 0 - 44 U/L  5        RADIOGRAPHIC STUDIES: I have personally reviewed the radiological images as listed and agreed with the  findings in the report. No results found.   ASSESSMENT & PLAN: 81 year old female  # Metastatic breast cancer  -CT evidence of hepatic, osseous, pulmonary, and axillary nodal metastatic disease  -Biopsy 02/29/2020 left axillary/chest wall mass confirmed metastatic carcinoma consistent with breast primary, ER +50% weak, PR negative, HER2 positive 3+.  She is currently on trastuzumab every 21 days started in February 2022, anastrozole 1 mg p.o. daily and Zometa for bone strengthening. -Received palliative radiation to the left axilla from 05/28/2020-06/25/2020 -Last echocardiogram showed  an ejection fraction drop of almost 20 to 25%.  We held trastuzumab and referred her to cardiology for further recommendations. Dr. Gala Romney has started her on carvedilol and Entresto for cardioprotection.   --Repeat echo in February showed EF of 55 to 60% -I have discussed with cardiology and they are okay with [place] resuming Herceptin.   --She has started Herceptin back in February and continues to tolerate it well. --Last systemic imaging showed some concern for systemic progression.  She is also had some visual deficit hence was seen by of ophthalmology for sudden onset central retinal artery occlusion.  CT head demonstrated brain metastasis.  MRI also confirmed supratentorial and infratentorial brain metastasis with some vasogenic edema however she did not want to proceed with any treatment in [place], wanted to come back to see [place] for follow-up for further recommendations. During her visit with [place] last week, we have discussed about options for treatment.  We have referred her to Dr. Trenton Founds for consideration of whole brain radiation therapy.  From systemic standpoint, have recommended Kadcyla at a modified dose to see if she can tolerate it if she decides to proceed with whole brain radiation.  Other option would be to try doing Herceptin and pertuzumab together although this may not elicit as good of response.  She is  not interested in any chemotherapy and she may not be a good candidate for aggressive chemotherapy.  She is not quite sure if she wants to proceed with any kind of treatment at this time.    She was clearly instructed to call us and let us know about her decision.  She has previously expressed to be DNR.  Ms. Dondra Spry will be her medical power of attorney.  If she does not wish to proceed with any treatment, she was recommended to consider hospice.  We have discussed about mechanism of action of Kadcyla, adverse effects of Kadcyla in detail today.  I have spent a total of 30 minutes minutes of face-to-face and non-face-to-face time, preparing to see the patient,  counseling and educating the patient, referring and communicating with other health care professionals, documenting clinical information in the electronic health record, and care coordination.    Rachel Moulds, MD Hematology and Oncology [facility] at [place] P: [phone]

## 2021-08-03 DIAGNOSIS — H04123 Dry eye syndrome of bilateral lacrimal glands: Secondary | ICD-10-CM | POA: Diagnosis not present

## 2021-08-03 DIAGNOSIS — C50919 Malignant neoplasm of unspecified site of unspecified female breast: Secondary | ICD-10-CM | POA: Diagnosis not present

## 2021-08-03 DIAGNOSIS — H5340 Unspecified visual field defects: Secondary | ICD-10-CM | POA: Diagnosis not present

## 2021-08-03 DIAGNOSIS — H53453 Other localized visual field defect, bilateral: Secondary | ICD-10-CM | POA: Diagnosis not present

## 2021-08-03 DIAGNOSIS — C7931 Secondary malignant neoplasm of brain: Secondary | ICD-10-CM | POA: Diagnosis not present

## 2021-08-05 ENCOUNTER — Telehealth: Payer: Self-pay | Admitting: *Deleted

## 2021-08-05 MED ORDER — DEXAMETHASONE 2 MG PO TABS: 2.0000 mg | ORAL_TABLET | Freq: Every day | ORAL | 0 refills | Status: DC

## 2021-08-05 MED ORDER — DEXAMETHASONE 2 MG PO TABS: 2.0000 mg | ORAL_TABLET | Freq: Every day | ORAL | 0 refills | Status: AC

## 2021-08-05 NOTE — Telephone Encounter (Signed)
This RN spoke with pt's caregiver- Dwyane Luo - who states the pt wanted her call and tell Dr Chryl Heck what she has decided -   " she states she does not want to do any radiation"   " she also wanted to know if she needed to continue use of the steroid because she took her last one today "  Per discussion - Langley Gauss states pt does not have any noted sensorium,balance or headaches.  Pt is scheduled for visit and infusion on 7/13.  They are following up with ophthalmology and her visual changes.  Per review with MD - refill sent for continued use of dexamethasone 2 mg daily.  Treatment may be cancelled if pt does not want to pursue more aggressive therapy for brain mets.  This RN called and spoke with Langley Gauss per above- with request if MD appt could be changed due to "she has a dental appt also on the 13th "  This RN was able to reschedule MD appt to 7/10 per opening.  Of note pt was in the presence of call per Langley Gauss continually would ask [place] questions during the call.  Langley Gauss understands refill has been sent for continued steroid use ( 10 additional days ).  Pt has no further needs per Langley Gauss.

## 2021-08-08 ENCOUNTER — Ambulatory Visit (HOSPITAL_COMMUNITY)
Admission: RE | Admit: 2021-08-08 | Discharge: 2021-08-08 | Disposition: A | Discharge status: Home / Self Care | Payer: Medicare HMO | Source: Ambulatory Visit | Attending: Hematology and Oncology | Admitting: Hematology and Oncology

## 2021-08-08 DIAGNOSIS — C7951 Secondary malignant neoplasm of bone: Secondary | ICD-10-CM | POA: Insufficient documentation

## 2021-08-08 DIAGNOSIS — C50919 Malignant neoplasm of unspecified site of unspecified female breast: Secondary | ICD-10-CM | POA: Insufficient documentation

## 2021-08-08 DIAGNOSIS — C7800 Secondary malignant neoplasm of unspecified lung: Secondary | ICD-10-CM | POA: Diagnosis not present

## 2021-08-08 DIAGNOSIS — Z853 Personal history of malignant neoplasm of breast: Secondary | ICD-10-CM | POA: Diagnosis not present

## 2021-08-08 DIAGNOSIS — C787 Secondary malignant neoplasm of liver and intrahepatic bile duct: Secondary | ICD-10-CM | POA: Diagnosis not present

## 2021-08-08 DIAGNOSIS — I700 Atherosclerosis of aorta: Secondary | ICD-10-CM | POA: Diagnosis not present

## 2021-08-08 MED ORDER — IOHEXOL 300 MG/ML  SOLN
80.0000 mL | Freq: Once | INTRAMUSCULAR | Status: AC | PRN
  Administered 2021-08-08: 80 mL via INTRAVENOUS

## 2021-08-10 ENCOUNTER — Encounter: Payer: Self-pay | Admitting: Hematology and Oncology

## 2021-08-10 ENCOUNTER — Inpatient Hospital Stay: Payer: Medicare HMO | Attending: Hematology and Oncology | Admitting: Hematology and Oncology

## 2021-08-10 ENCOUNTER — Other Ambulatory Visit: Payer: Self-pay

## 2021-08-10 VITALS — BP 154/73 | HR 62 | Temp 97.5°F | Resp 18 | Wt 110.5 lb

## 2021-08-10 DIAGNOSIS — Z79811 Long term (current) use of aromatase inhibitors: Secondary | ICD-10-CM | POA: Diagnosis not present

## 2021-08-10 DIAGNOSIS — C50912 Malignant neoplasm of unspecified site of left female breast: Secondary | ICD-10-CM | POA: Insufficient documentation

## 2021-08-10 DIAGNOSIS — Z803 Family history of malignant neoplasm of breast: Secondary | ICD-10-CM | POA: Insufficient documentation

## 2021-08-10 DIAGNOSIS — C7931 Secondary malignant neoplasm of brain: Secondary | ICD-10-CM

## 2021-08-10 DIAGNOSIS — Z79899 Other long term (current) drug therapy: Secondary | ICD-10-CM | POA: Diagnosis not present

## 2021-08-10 DIAGNOSIS — Z8051 Family history of malignant neoplasm of kidney: Secondary | ICD-10-CM | POA: Diagnosis not present

## 2021-08-10 DIAGNOSIS — Z7984 Long term (current) use of oral hypoglycemic drugs: Secondary | ICD-10-CM | POA: Diagnosis not present

## 2021-08-10 DIAGNOSIS — C7951 Secondary malignant neoplasm of bone: Secondary | ICD-10-CM | POA: Insufficient documentation

## 2021-08-10 DIAGNOSIS — C50919 Malignant neoplasm of unspecified site of unspecified female breast: Secondary | ICD-10-CM

## 2021-08-10 DIAGNOSIS — Z5112 Encounter for antineoplastic immunotherapy: Secondary | ICD-10-CM | POA: Diagnosis not present

## 2021-08-10 DIAGNOSIS — Z923 Personal history of irradiation: Secondary | ICD-10-CM | POA: Diagnosis not present

## 2021-08-10 NOTE — Progress Notes (Signed)
[place]   [phone] [phone]   Clinic Follow up Note   Patient Care Team: Pcp, No as PCP - Sherryl Manges, MD as Consulting Physician (Hematology and Oncology) 08/10/2021  CHIEF COMPLAINT: Stage IV left breast cancer, ER+ / PR- / Her2+, grade 3  SUMMARY OF ONCOLOGIC HISTORY: Oncology History  Bone metastases  02/27/2020 Initial Diagnosis   Bone metastases ([place])   Carcinoma of breast metastatic to bone ([place])  03/19/2020 Initial Diagnosis   Carcinoma of breast metastatic to bone ([place])   03/24/2020 -  Chemotherapy   Patient is on Treatment Plan : BREAST Trastuzumab q21d     04/15/2020 Cancer Staging   Staging form: Breast, AJCC 8th Edition - Clinical stage from 04/15/2020: Stage IV (cTX, cNX, cM1, GX, ER+, PR-, HER2+) - Signed by [name], MD on 04/15/2020 Stage prefix: Initial diagnosis Histologic grading system: 3 grade system   07/08/2021 Genetic Testing   Negative hereditary cancer genetic testing: no pathogenic variants detected in [facility] CancerNext-Expanded +RNAinsight Panel.  Variant of uncertain significance detected in RET at  p.Y1015H (c.3043T>C).  Report date is July 08, 2021.  The CancerNext-Expanded gene panel offered by [facility] and includes sequencing, rearrangement, and RNA analysis for the following 77 genes: AIP, ALK, APC, ATM, AXIN2, BAP1, BARD1, BLM, BMPR1A, BRCA1, BRCA2, BRIP1, CDC73, CDH1, CDK4, CDKN1B, CDKN2A, CHEK2, CTNNA1, DICER1, FANCC, FH, FLCN, GALNT12, KIF1B, LZTR1, MAX, MEN1, MET, MLH1, MSH2, MSH3, MSH6, MUTYH, NBN, NF1, NF2, NTHL1, PALB2, PHOX2B, PMS2, POT1, PRKAR1A, PTCH1, PTEN, RAD51C, RAD51D, RB1, RECQL, RET, SDHA, SDHAF2, SDHB, SDHC, SDHD, SMAD4, SMARCA4, SMARCB1, SMARCE1, STK11, SUFU, TMEM127, TP53, TSC1, TSC2, VHL and XRCC2 (sequencing and deletion/duplication); EGFR, EGLN1, HOXB13, KIT, MITF, PDGFRA, POLD1, and POLE (sequencing only); EPCAM and GREM1 (deletion/duplication only).         CURRENT THERAPY:  Anastrozole 1 mg p.o. daily Herceptin/Kanjinti q3 weeks Zometa q3 months  INTERVAL HISTORY:   Ms. Biswell returns for follow-up with [name] and her niece.   She is doing about the same.  She says her vision is not GERD and she has also been dealing with some balance issues, using a walker now.  She is still unable to decide if she wants to consider whole brain radiation therapy.  She was wondering if she can however just continue the Herceptin alone.  She is tolerating 2 mg of Dex daily well.  She tells me that this helps her.  No other pain reported.  She continues to deny any headaches, weakness or incontinence issues.  No pain.  Rest of the pertinent 10 point ROS reviewed and negative.   MEDICAL HISTORY:  Past Medical History:  Diagnosis Date   Breast cancer [facility])    with bone metastasis   Family history of breast cancer 06/11/2021   Family history of kidney cancer 06/11/2021    SURGICAL HISTORY: Past Surgical History:  Procedure Laterality Date   IR PERC PLEURAL DRAIN W/INDWELL CATH W/IMG GUIDE  06/03/2020   IR RADIOLOGIST EVAL & MGMT  07/31/2020   IR REMOVAL OF PLURAL CATH W/CUFF  10/31/2020    I have reviewed the social history and family history with the patient and they are unchanged from previous note.  ALLERGIES:  is allergic to vicodin hp [hydrocodone-acetaminophen].  MEDICATIONS:  Current Outpatient Medications  Medication Sig Dispense Refill   anastrozole (ARIMIDEX) 1 MG tablet Take 1 tablet (1 mg total) by mouth daily. 30 tablet 11   carvedilol (COREG) 6.25 MG tablet Take  1 tablet (6.25 mg total) by mouth 2 (two) times daily with a meal. 60 tablet 6   Chlorphen-Pseudoephed-APAP (TYLENOL ALLERGY SINUS PO) Take 1 tablet by mouth daily as needed (allergy).     dexamethasone (DECADRON) 2 MG tablet Take 1 tablet (2 mg total) by mouth daily. 10 tablet 0   losartan (COZAAR) 50 MG tablet Take 1 tablet (50 mg total) by mouth daily. 30 tablet 3    metFORMIN (GLUCOPHAGE) 500 MG tablet Take 1 tablet (500 mg total) by mouth daily with breakfast. (Patient not taking: Reported on 07/27/2021) 60 tablet 0   oxymetazoline (AFRIN) 0.05 % nasal spray Place 1 spray into both nostrils daily as needed for congestion.     No current facility-administered medications for this visit.      PHYSICAL EXAMINATION: ECOG PERFORMANCE STATUS: 0 - Asymptomatic  Vitals:   08/10/21 1120  BP: (!) 154/73  Pulse: 62  Resp: 18  Temp: (!) 97.5 F (36.4 C)  SpO2: 94%   Filed Weights   08/10/21 1120  Weight: 110 lb 8 oz (50.1 kg)    Physical exam deferred today, She does appear to have some crossed eye today. Again, she arrived today with a walker.  Rest of the physical exam deferred  LABORATORY DATA:  I have reviewed the data as listed    Latest Ref Rng & Units 07/24/2021   11:31 AM 07/16/2021   10:39 AM 07/02/2021    1:16 PM  CBC  WBC 4.0 - 10.5 K/uL 7.8  7.0  7.8   Hemoglobin 12.0 - 15.0 g/dL 9.8  9.3  9.8   Hematocrit 36.0 - 46.0 % 30.2  29.2  30.4   Platelets 150 - 400 K/uL 281  314  314         Latest Ref Rng & Units 07/24/2021   11:31 AM 07/16/2021   10:39 AM 07/02/2021    1:16 PM  CMP  Glucose 70 - 99 mg/dL 255  222  478   BUN 8 - 23 mg/dL _0 Creatinine 0.44 - 1.00 mg/dL 0.86  1.00  0.92   Sodium 135 - 145 mmol/L 135  134  130   Potassium 3.5 - 5.1 mmol/L 4.1  3.9  4.3   Chloride 98 - 111 mmol/L 100  98  94   CO2 22 - 32 mmol/L _1 Calcium 8.9 - 10.3 mg/dL 9.4  9.3  9.7   Total Protein 6.5 - 8.1 g/dL  7.0    Total Bilirubin 0.3 - 1.2 mg/dL  0.4    Alkaline Phos 38 - 126 U/L  96    AST 15 - 41 U/L  9    ALT 0 - 44 U/L  5        RADIOGRAPHIC STUDIES: I have personally reviewed the radiological images as listed and agreed with the findings in the report. No results found.   ASSESSMENT & PLAN: 81 year old female  # Metastatic breast cancer  -CT evidence of hepatic, osseous, pulmonary, and axillary nodal  metastatic disease  -Biopsy 02/29/2020 left axillary/chest wall mass confirmed metastatic carcinoma consistent with breast primary, ER +50% weak, PR negative, HER2 positive 3+.  She is currently on trastuzumab every 21 days started in February 2022, anastrozole 1 mg p.o. daily and Zometa for bone strengthening. -Received palliative radiation to the left axilla from 05/28/2020-06/25/2020 -Last echocardiogram showed an ejection fraction drop of almost 20 to 25%.  We  held trastuzumab and referred her to cardiology for further recommendations. Dr. Haroldine Laws has started her on carvedilol and Entresto for cardioprotection.   --Repeat echo in February showed EF of 55 to 60% -I have discussed with cardiology and they are okay with [place] resuming Herceptin.   --She has started Herceptin back in February and continues to tolerate it well. --Last systemic imaging showed some concern for systemic progression.  She is also had some visual deficit hence was seen by of ophthalmology for sudden onset central retinal artery occlusion.  CT head demonstrated brain metastasis.  MRI also confirmed supratentorial and infratentorial brain metastasis with some vasogenic edema however she did not want to proceed with any treatment in [place], wanted to come back to see [place] for follow-up for further recommendations. She met with Dr. Sondra Come from radiation oncology who recommended whole brain radiation.  We have once again ordered systemic imaging which did not show evidence of overt progression, stable to slightly improved systemic disease. She wondered if she can just proceed with Herceptin alone.  We have discussed that this is not ideal especially since she had intracranial progression on Herceptin.  If she wishes to pursue whole brain radiation therapy, then I can continue Herceptin alone and monitor.  She is however very worried about the adverse effects from whole brain radiation and would like to continue thinking about it.  I have  prescribed her dexamethasone 2 mg daily, higher doses were not tolerated well 1 because of her age, two because of her severe hyperglycemia-she also had some hallucinations. She was clearly instructed to call us and let us know about her decision.  She was also instructed to call Dr. Clabe Seal office and let them know about her decision We will cancel infusion in the interim.  If she does not wish to proceed with any treatment, she was recommended to consider hospice.    I have spent a total of 30 minutes minutes of face-to-face and non-face-to-face time, preparing to see the patient,  counseling and educating the patient, referring and communicating with other health care professionals, documenting clinical information in the electronic health record, and care coordination.    [name], MD Hematology and [place] at [place]: [phone]

## 2021-08-13 ENCOUNTER — Inpatient Hospital Stay: Payer: Medicare HMO

## 2021-08-24 ENCOUNTER — Other Ambulatory Visit: Payer: Self-pay

## 2021-08-24 ENCOUNTER — Telehealth: Payer: Self-pay | Admitting: Genetic Counselor

## 2021-08-24 ENCOUNTER — Ambulatory Visit: Payer: Self-pay | Admitting: Genetic Counselor

## 2021-08-24 ENCOUNTER — Encounter: Payer: Self-pay | Admitting: Hematology and Oncology

## 2021-08-24 DIAGNOSIS — Z1379 Encounter for other screening for genetic and chromosomal anomalies: Secondary | ICD-10-CM

## 2021-08-24 DIAGNOSIS — Z803 Family history of malignant neoplasm of breast: Secondary | ICD-10-CM

## 2021-08-24 DIAGNOSIS — C50919 Malignant neoplasm of unspecified site of unspecified female breast: Secondary | ICD-10-CM

## 2021-08-24 NOTE — Progress Notes (Signed)
HPI:   Morgan Yu was previously seen in the [place] clinic due to a personal and family history of breast cancer and concerns regarding a hereditary predisposition to cancer. Please refer to our prior cancer genetics clinic note for more information regarding our discussion, assessment and recommendations, at the time. Morgan Yu's recent genetic test results were disclosed to her niece, Morgan Yu, as were recommendations warranted by these results.   CANCER HISTORY:  Oncology History  Bone metastases  02/27/2020 Initial Diagnosis   Bone metastases ([place])   Carcinoma of breast metastatic to bone ([place])  03/19/2020 Initial Diagnosis   Carcinoma of breast metastatic to bone ([place])   03/24/2020 -  Chemotherapy   Patient is on Treatment Plan : BREAST Trastuzumab q21d     04/15/2020 Cancer Staging   Staging form: Breast, AJCC 8th Edition - Clinical stage from 04/15/2020: Stage IV (cTX, cNX, cM1, GX, ER+, PR-, HER2+) - Signed by [name], MD on 04/15/2020 Stage prefix: Initial diagnosis Histologic grading system: 3 grade system   07/08/2021 Genetic Testing   Negative hereditary cancer genetic testing: no pathogenic variants detected in [facility] CancerNext-Expanded +RNAinsight Panel.  Variant of uncertain significance detected in RET at  p.Y1015H (c.3043T>C).  Report date is July 08, 2021.  The CancerNext-Expanded gene panel offered by [facility] and includes sequencing, rearrangement, and RNA analysis for the following 77 genes: AIP, ALK, APC, ATM, AXIN2, BAP1, BARD1, BLM, BMPR1A, BRCA1, BRCA2, BRIP1, CDC73, CDH1, CDK4, CDKN1B, CDKN2A, CHEK2, CTNNA1, DICER1, FANCC, FH, FLCN, GALNT12, KIF1B, LZTR1, MAX, MEN1, MET, MLH1, MSH2, MSH3, MSH6, MUTYH, NBN, NF1, NF2, NTHL1, PALB2, PHOX2B, PMS2, POT1, PRKAR1A, PTCH1, PTEN, RAD51C, RAD51D, RB1, RECQL, RET, SDHA, SDHAF2, SDHB, SDHC, SDHD, SMAD4, SMARCA4, SMARCB1, SMARCE1, STK11, SUFU, TMEM127, TP53, TSC1, TSC2, VHL and XRCC2 (sequencing and  deletion/duplication); EGFR, EGLN1, HOXB13, KIT, MITF, PDGFRA, POLD1, and POLE (sequencing only); EPCAM and GREM1 (deletion/duplication only).         FAMILY HISTORY:  We obtained a detailed, 4-generation family history.  Significant diagnoses are listed below:      Family History  Problem Relation Age of Onset   Kidney cancer Brother 51   Esophageal cancer Maternal Aunt     Cancer Maternal Uncle          unknown type; metastatic; d. 2s   Breast cancer Paternal Aunt          dx 7s   Breast cancer Paternal Uncle          dx 71s   Cancer Paternal Grandmother          unknown type; ? breast ? lung; dx after 59   Breast cancer Cousin          paternal female cousin; dx after 54       In addition the cancer listed above, she also reported metastatic prostate cancer, female breast cancer, and metastatic melanoma in maternal first cousins once removed. Morgan Yu is Morgan Yu of previous family history of genetic testing for hereditary cancer risks. There is no reported Ashkenazi Jewish ancestry. There is no known consanguinity.  GENETIC TEST RESULTS:  The [facility] CancerNext-Expanded +RNAinsight Panel found no pathogenic mutations.   The CancerNext-Expanded gene panel offered by [facility] and includes sequencing, rearrangement, and RNA analysis for the following 77 genes: AIP, ALK, APC, ATM, AXIN2, BAP1, BARD1, BLM, BMPR1A, BRCA1, BRCA2, BRIP1, CDC73, CDH1, CDK4, CDKN1B, CDKN2A, CHEK2, CTNNA1, DICER1, FANCC, FH, FLCN, GALNT12, KIF1B, LZTR1, MAX, MEN1, MET, MLH1, MSH2, MSH3, MSH6,  MUTYH, NBN, NF1, NF2, NTHL1, PALB2, PHOX2B, PMS2, POT1, PRKAR1A, PTCH1, PTEN, RAD51C, RAD51D, RB1, RECQL, RET, SDHA, SDHAF2, SDHB, SDHC, SDHD, SMAD4, SMARCA4, SMARCB1, SMARCE1, STK11, SUFU, TMEM127, TP53, TSC1, TSC2, VHL and XRCC2 (sequencing and deletion/duplication); EGFR, EGLN1, HOXB13, KIT, MITF, PDGFRA, POLD1, and POLE (sequencing only); EPCAM and GREM1 (deletion/duplication only).   The test report has  been scanned into EPIC and is located under the Molecular Pathology section of the Results Review tab.  A portion of the result report is included below for reference. Genetic testing reported out on July 08, 2021.      Genetic testing identified a variant of uncertain significance (VUS) in the RET gene called c.3043T>C (p.Y1015H).  At this time, it is unknown if this variant is associated with an increased risk for cancer or if it is benign, but most uncertain variants are reclassified to benign. It should not be used to make medical management decisions. With time, we suspect the laboratory will determine the significance of this variant, if any. If the laboratory reclassifies this variant, we will attempt to contact Ms. Scruggs to discuss it further.   Even though a pathogenic variant was not identified, possible explanations for the cancer in the family may include: There may be no hereditary risk for cancer in the family. The cancers in Morgan Yu and/or her family may be sporadic/familial or due to other genetic and environmental factors. There may be a gene mutation in one of these genes that current testing methods cannot detect but that chance is small. There could be another gene that has not yet been discovered, or that we have not yet tested, that is responsible for the cancer diagnoses in the family.  It is also possible there is a hereditary cause for the cancer in the family that Ms. Terada did not inherit.  Therefore, it is important to remain in touch with cancer genetics in the future so that we can continue to offer Morgan Yu the most up to date genetic testing.     ADDITIONAL GENETIC TESTING:  We discussed with Ms. Garciagarcia that her genetic testing was fairly extensive.  If there are additional relevant genes identified to increase cancer risk that can be analyzed in the future, we would be happy to discuss and coordinate this testing at that time.     CANCER SCREENING  RECOMMENDATIONS:  Ms. Serio's test result is considered negative (normal).  This means that we have not identified a hereditary cause for her personal history of breast cancer at this time.   An individual's cancer risk and medical management are not determined by genetic test results alone. Overall cancer risk assessment incorporates additional factors, including personal medical history, family history, and any available genetic information that may result in a personalized plan for cancer prevention and surveillance. Therefore, it is recommended she continue to follow the cancer management and screening guidelines provided by her oncology and primary healthcare provider.  RECOMMENDATIONS FOR FAMILY MEMBERS:   Individuals in this family might be at some increased risk of developing cancer, over the general population risk, due to the family history of cancer.  Individuals in the family should notify their providers of the family history of cancer. We recommend women in this family have a yearly mammogram beginning at age 73, or 19 years younger than the earliest onset of cancer, an annual clinical breast exam, and perform monthly breast self-exams.  Other members of the family may still carry a pathogenic variant in one of  these genes that Ms. Desmarais did not inherit. Based on the family history, we recommend her paternal relatives, who were diagnosed with breast cancer (or, if deceased, their first degree relatives) have genetic counseling and testing. Niece Morgan Yu stated they are not available for genetic testing.  We discussed that Morgan Yu should consider genetic counseling/testing given that her father and paternal grandfather are deceased and affected relatives are deceased.  We do not recommend familial testing for the RET variant of uncertain significance (VUS).  FOLLOW-UP:  Lastly, we discussed with Ms. Augusta that cancer genetics is a rapidly advancing field and it is possible that new genetic  tests will be appropriate for her and/or her family members in the future. We encouraged her to remain in contact with cancer genetics on an annual basis so we can update her personal and family histories and let her know of advances in cancer genetics that may benefit this family.   Our contact number was provided. Ms. Duddy's questions were answered to her satisfaction, and she knows she is welcome to call us at anytime with additional questions or concerns.   Zyire Eidson M. Joette Catching, [place], [facility] Genetic Counselor [email or web address] (P) [phone]

## 2021-08-24 NOTE — Telephone Encounter (Signed)
Spoke w/ niece Linus Orn who was with patient Morgan Yu.  Revealed negative genetics w/ VUS in RET.

## 2021-08-25 ENCOUNTER — Telehealth: Payer: Self-pay | Admitting: *Deleted

## 2021-08-25 NOTE — Telephone Encounter (Signed)
This RN received a My Chart message regarding obtaining hospice services.  Per above - call made to pt- with the patient's niece - Morgan Yu the phone. Morgan Yu states " she has taken to her bed now and only gets up to the bathroom" " She is not eating and early this morning asked " how much longer do you think I have- I am ready to go- I am tired "  Per above - hospice as inquired by the pt to her niece who then sent the my chart note.  Pt is presently sleeping.  Per phone discussion hospice services discussed with note that pt has declined any further treatments for her known metastatic breast cancer.  This RN inquired about who is pt's HCPOA Morgan Yu is next of kin ) with Morgan Yu stating it is Lupie's cousin - Morgan Yu.  Morgan Yu will be coming to the home today as well.  Per MD review- [facility] contacted - this RN spoke with Morgan Yu in intake per above who stated they will be able to visit today- she will pull records from Epic and contact the patient's home.  This RN informed Morgan Yu of above and to expect a call from Morgan Yu.

## 2021-08-31 ENCOUNTER — Other Ambulatory Visit: Payer: Self-pay | Admitting: Hematology and Oncology

## 2021-09-03 ENCOUNTER — Other Ambulatory Visit: Payer: Self-pay

## 2021-09-09 ENCOUNTER — Inpatient Hospital Stay: Payer: Medicare HMO | Admitting: Hematology and Oncology

## 2021-10-01 ENCOUNTER — Encounter (HOSPITAL_COMMUNITY): Payer: Medicare HMO | Admitting: Internal Medicine

## 2021-10-01 ENCOUNTER — Other Ambulatory Visit (HOSPITAL_COMMUNITY): Payer: Medicare HMO

## 2022-01-30 IMAGING — DX DG CHEST 2V
2 series · 2 of 2 positions shown · non-contrast
Comparison: May 27, 2020

CLINICAL DATA: Shortness of breath

EXAM:
CHEST - 2 VIEW

[chest pa]
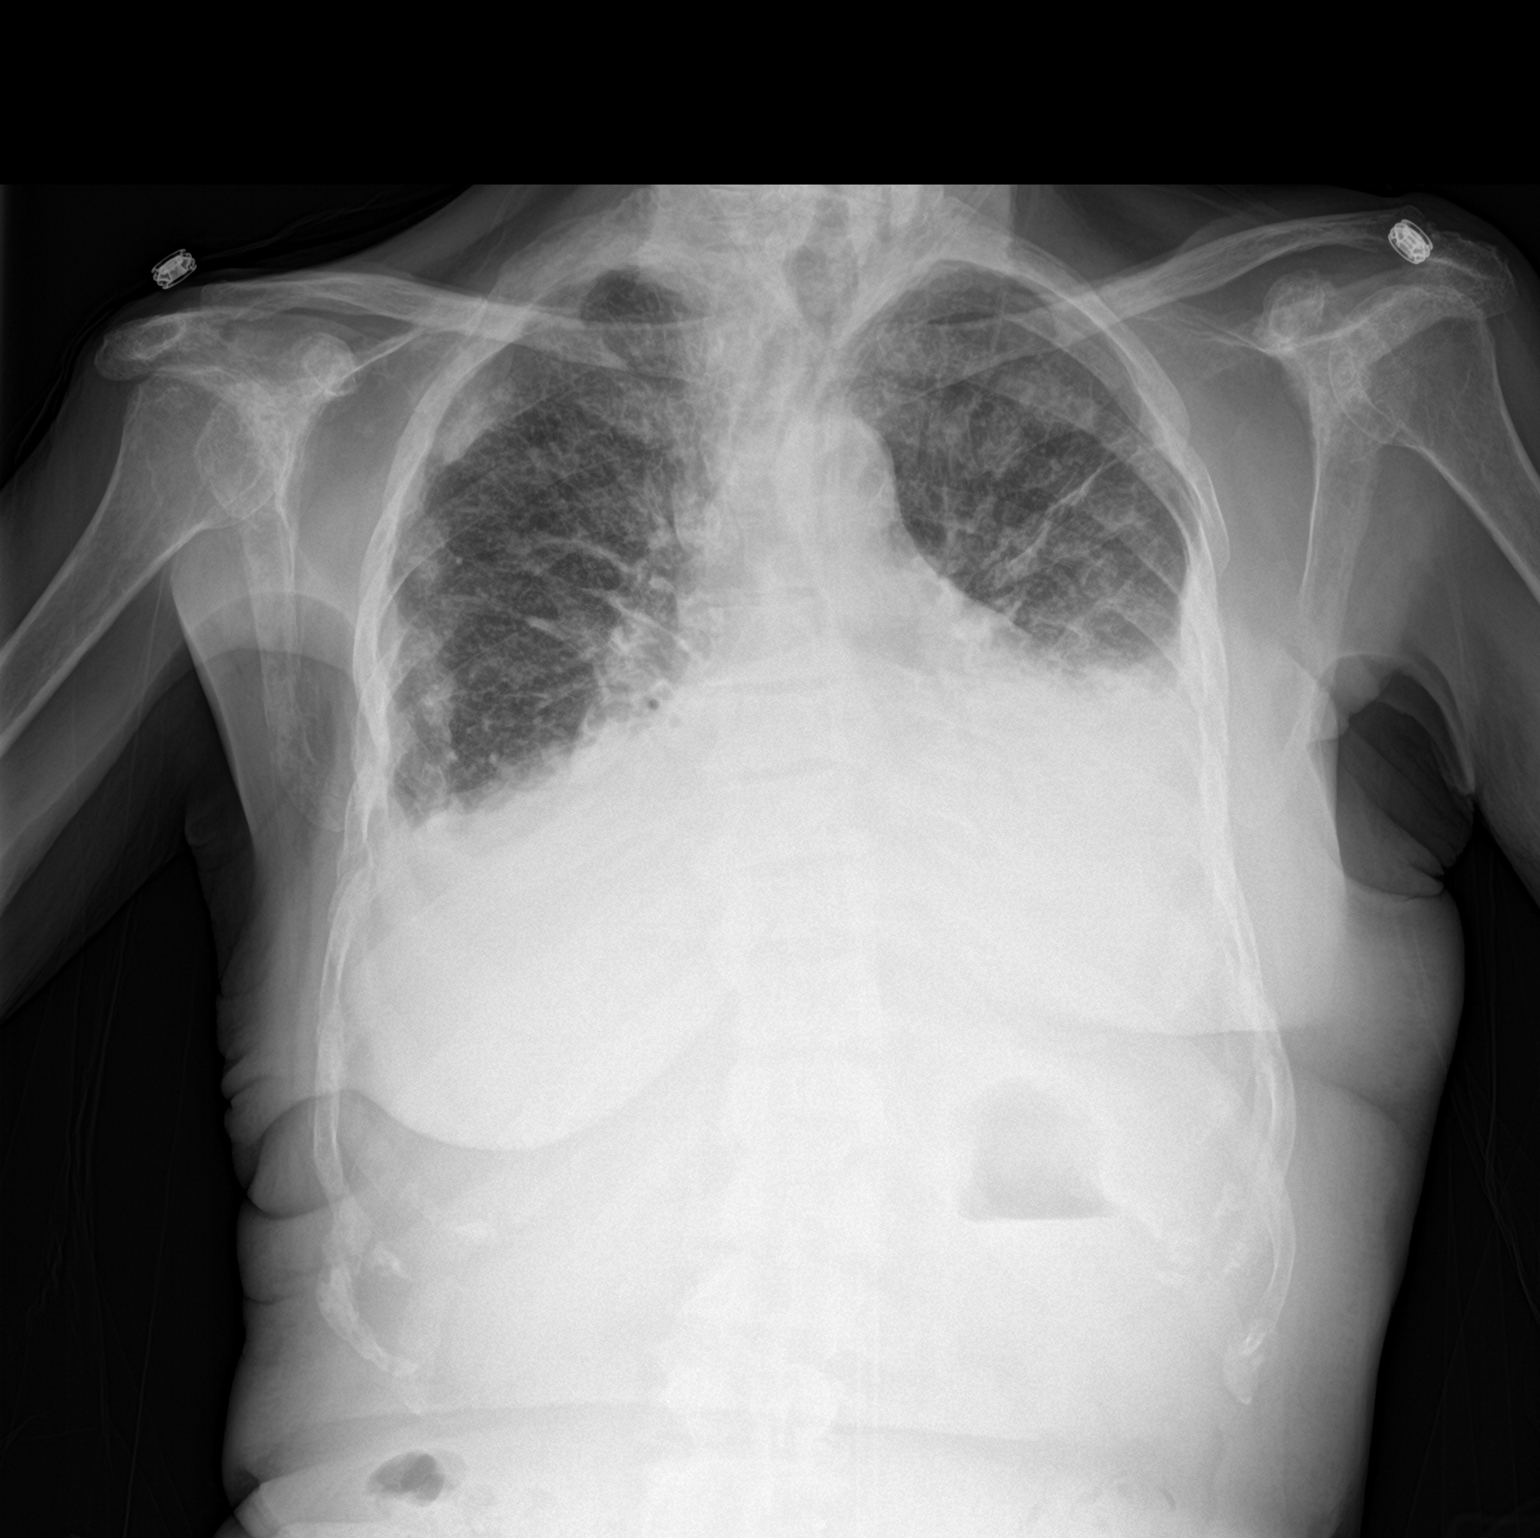

[chest lat]
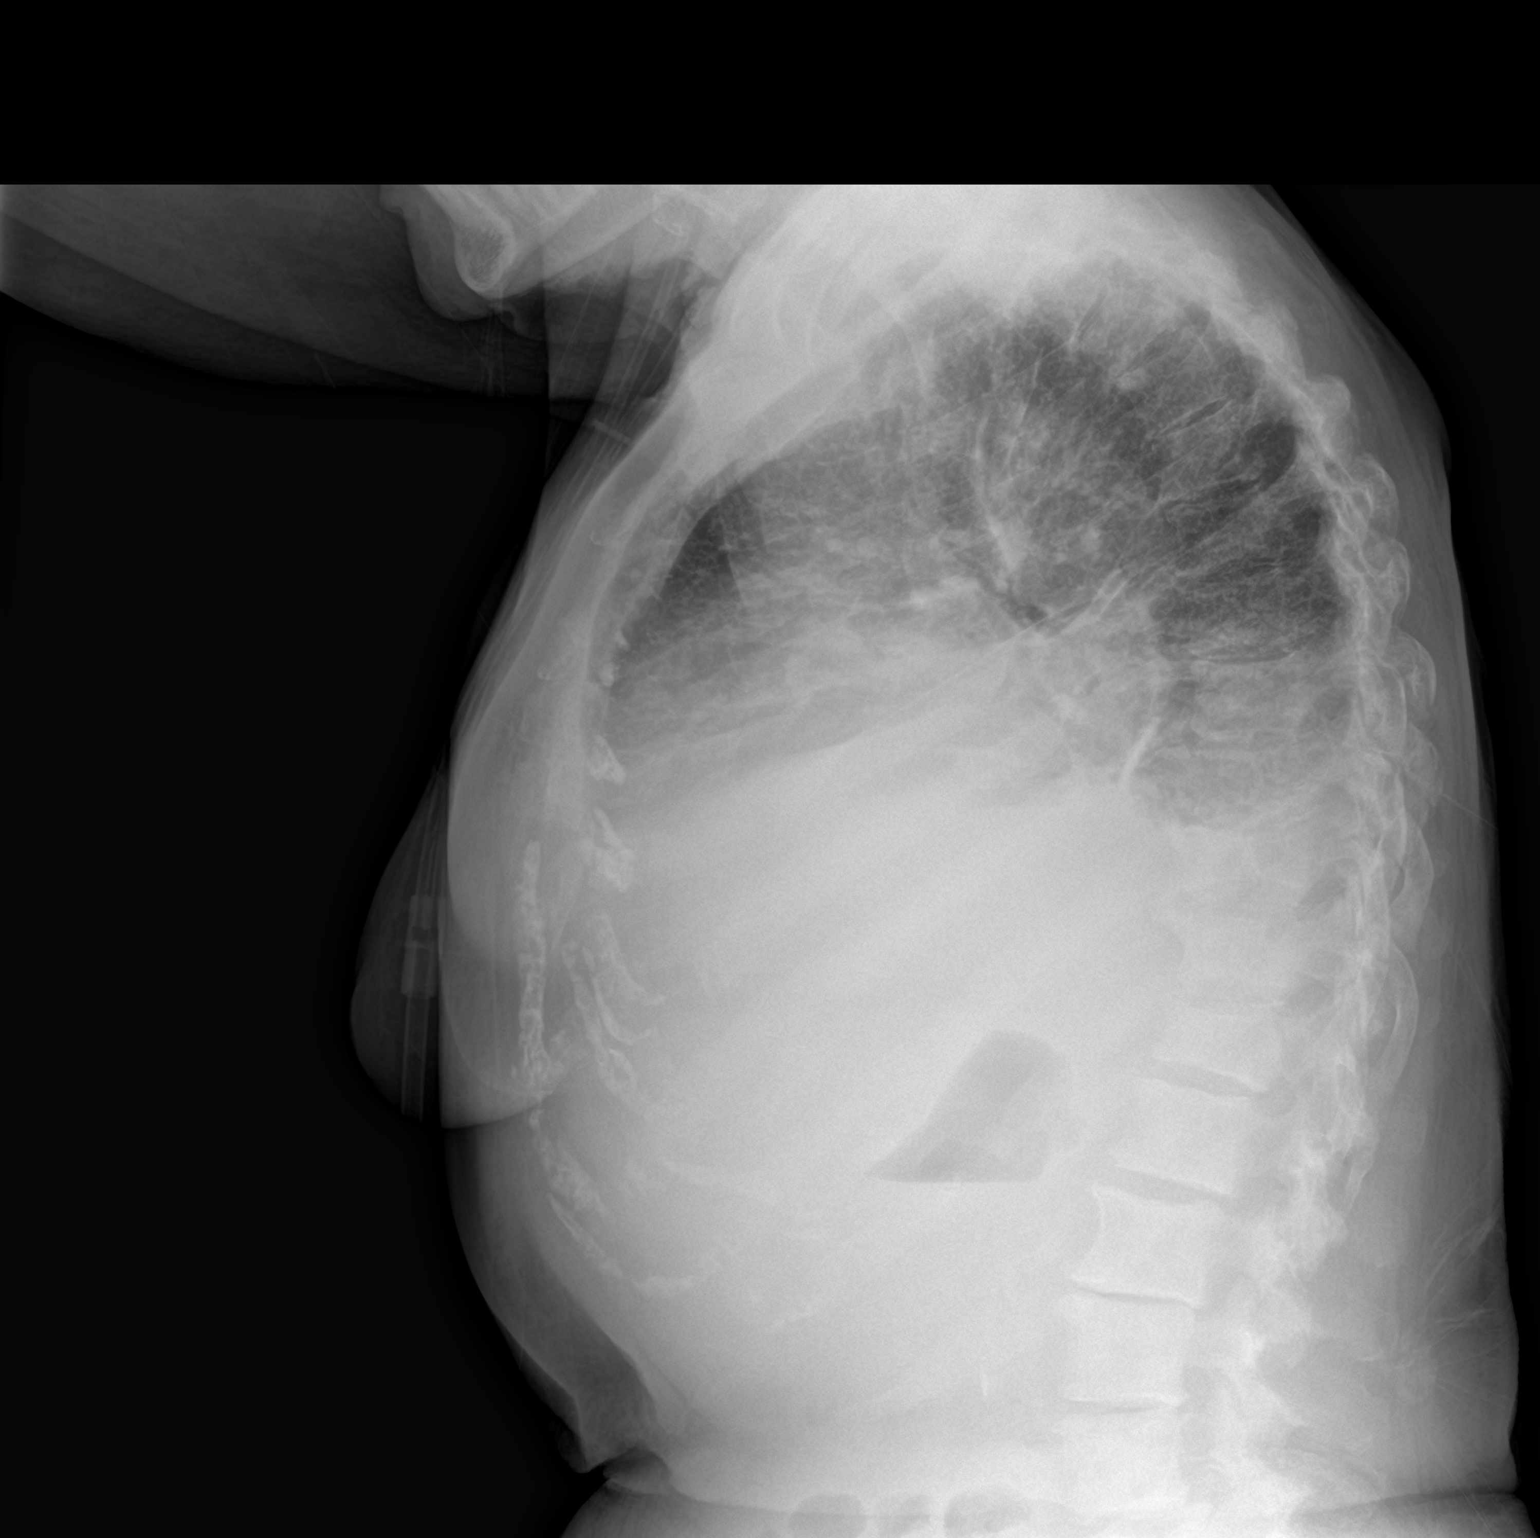

[2 of 2 positions shown; findings below may reference images not displayed]

FINDINGS: Pleural effusions bilaterally again noted. There is interstitial
pulmonary edema with areas of upper lobe atelectatic change
bilaterally. There is consolidation in portions of each lung base.
Heart is mildly enlarged with a degree of pulmonary venous
hypertension. No adenopathy. There is aortic atherosclerosis.
Multiple sclerotic bone lesions identified consistent with
metastatic disease from known breast carcinoma.
IMPRESSION: 1. Pleural effusions bilaterally with a degree of interstitial
edema. There is cardiomegaly with pulmonary vascular congestion.
Appearance felt to be indicative of a degree of underlying
congestive heart failure.

2. Bibasilar atelectasis. A degree of bibasilar consolidation is
questioned.

3.  Sclerotic bony metastases again noted.

4.  Aortic Atherosclerosis ([id number]).

## 2022-01-31 IMAGING — DX DG CHEST 2V
2 series · 2 of 2 positions shown · non-contrast
Comparison: 05/29/2020

CLINICAL DATA: Dyspnea

EXAM:
CHEST - 2 VIEW

[chest pa]
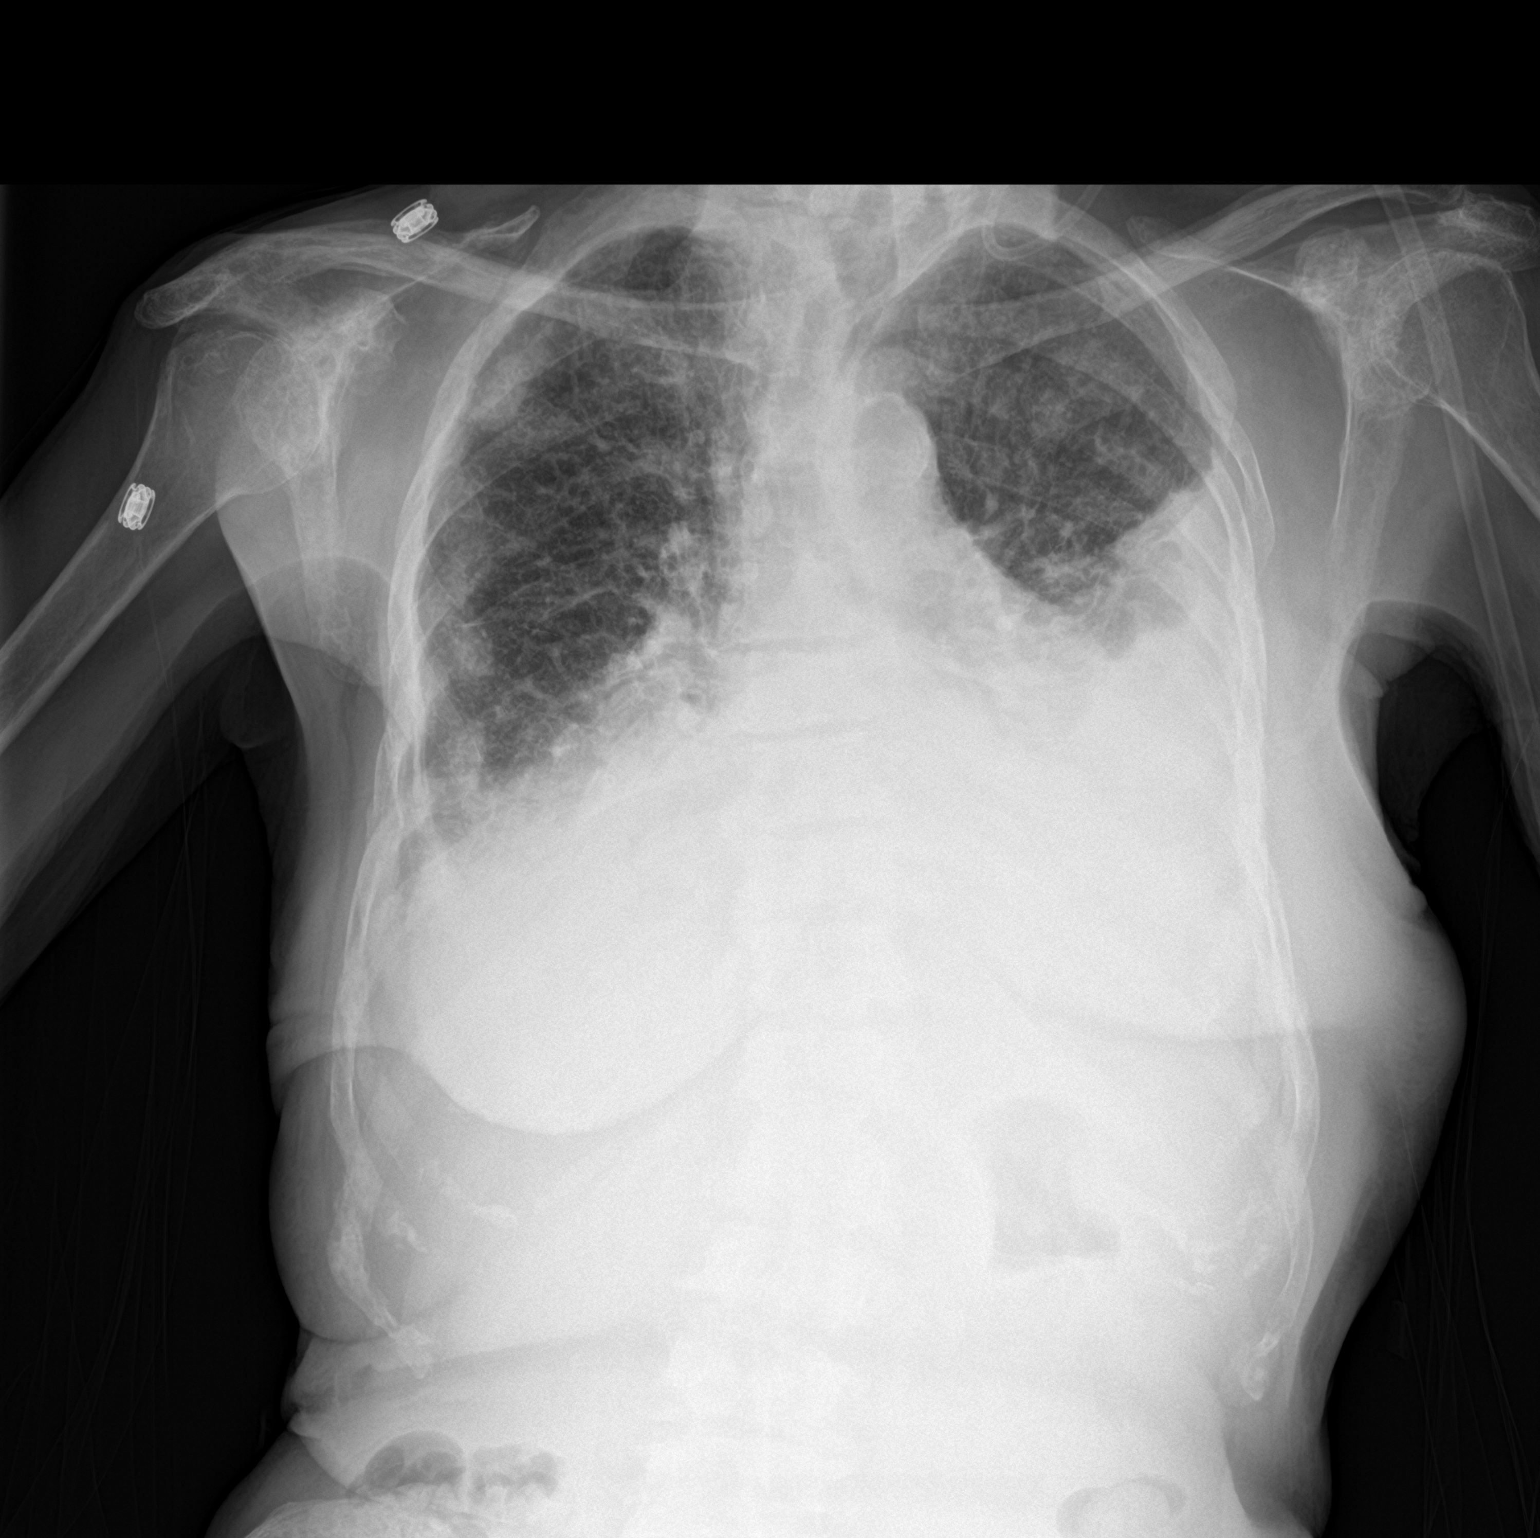

[chest lat]
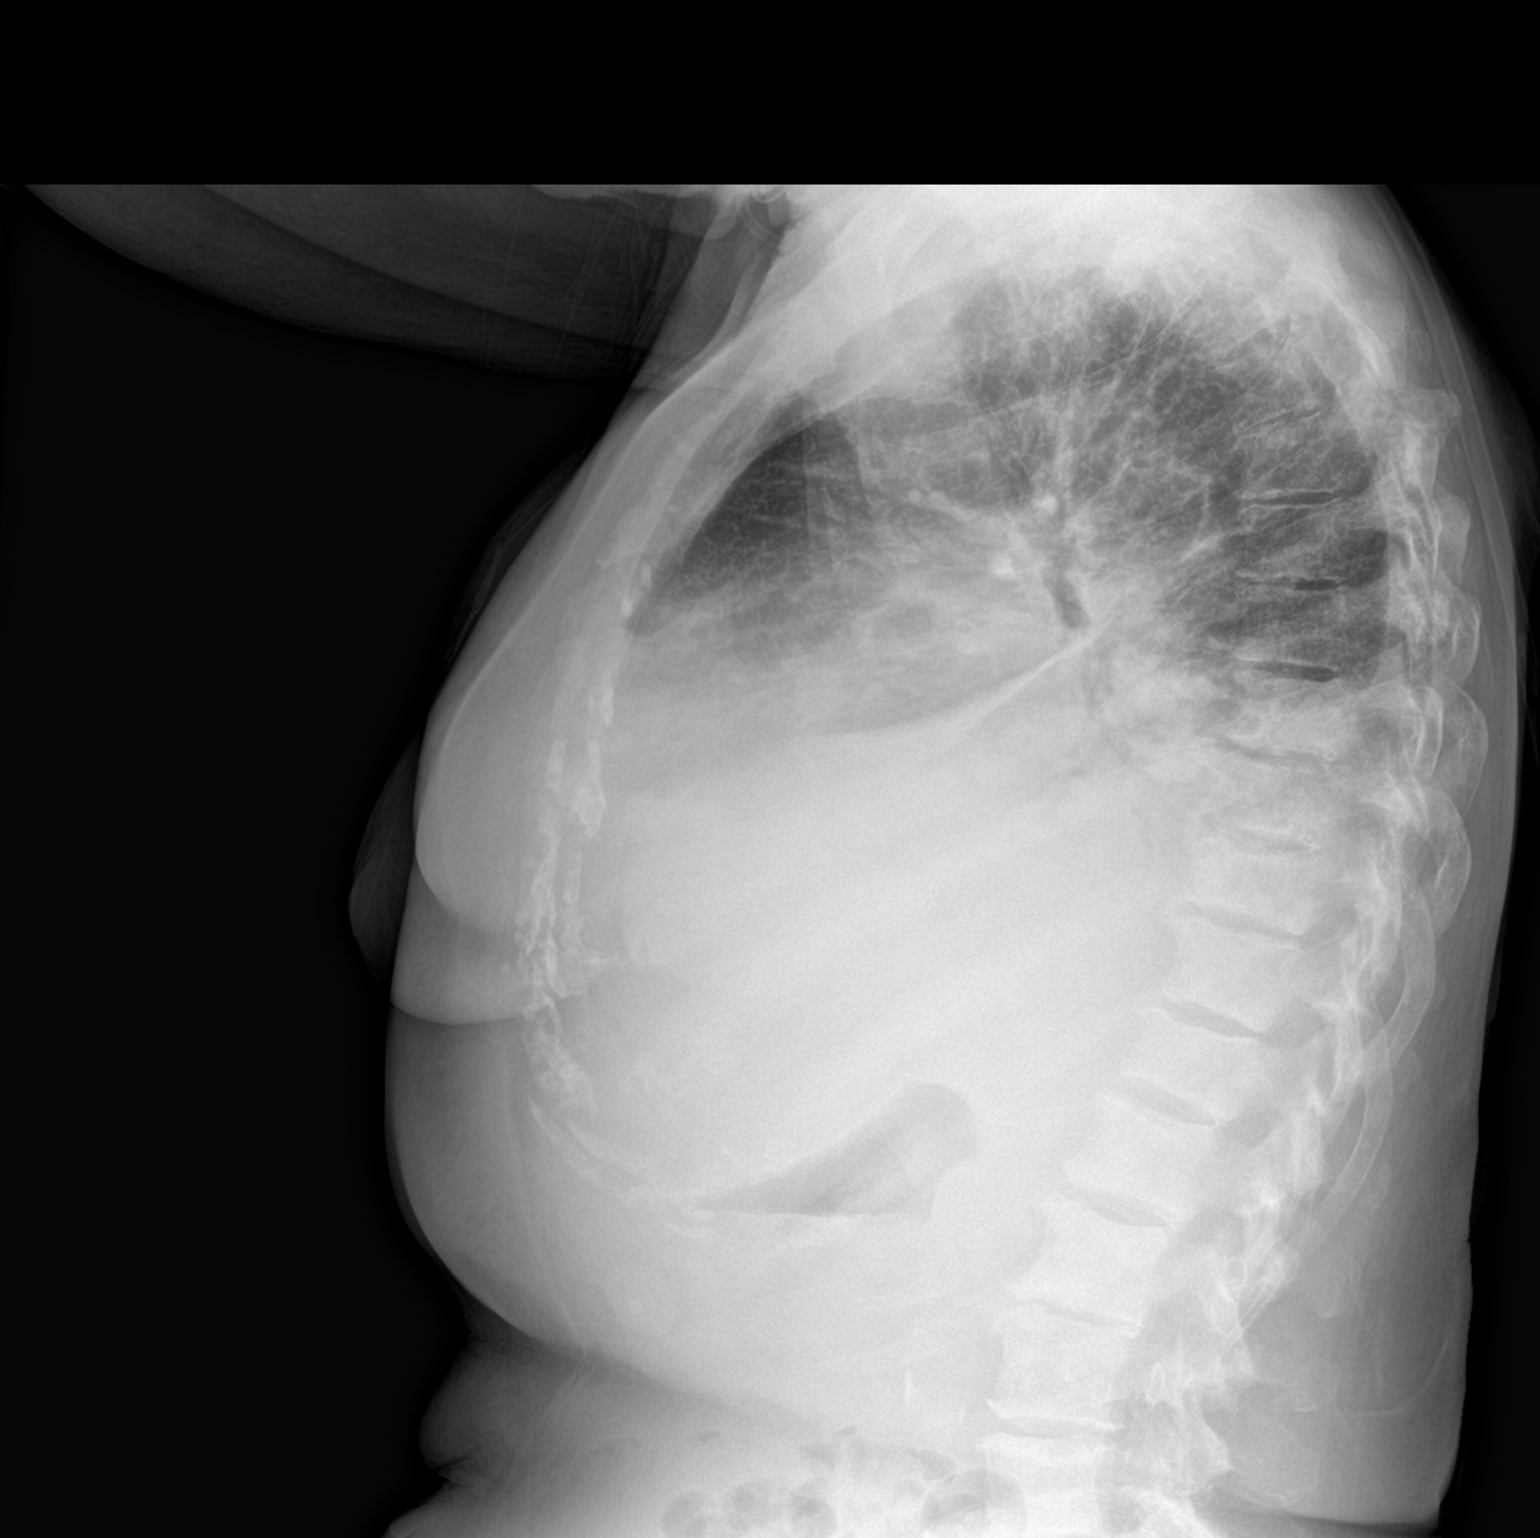

[2 of 2 positions shown; findings below may reference images not displayed]

FINDINGS: Increased moderate left pleural effusion with adjacent atelectasis.
Increased mild right pleural effusion with adjacent atelectasis.
There may be superimposed consolidation. Similar cardiomediastinal
contours. Sclerotic osseous metastatic disease.
IMPRESSION: Increased left greater than right pleural effusions with adjacent
atelectasis/consolidation.

## 2022-02-03 IMAGING — DX DG CHEST 2V
2 series · 2 of 2 positions shown · non-contrast
Comparison: May 30, 2020

CLINICAL DATA: Pleural effusions.  Breast carcinoma

EXAM:
CHEST - 2 VIEW

[chest pa]
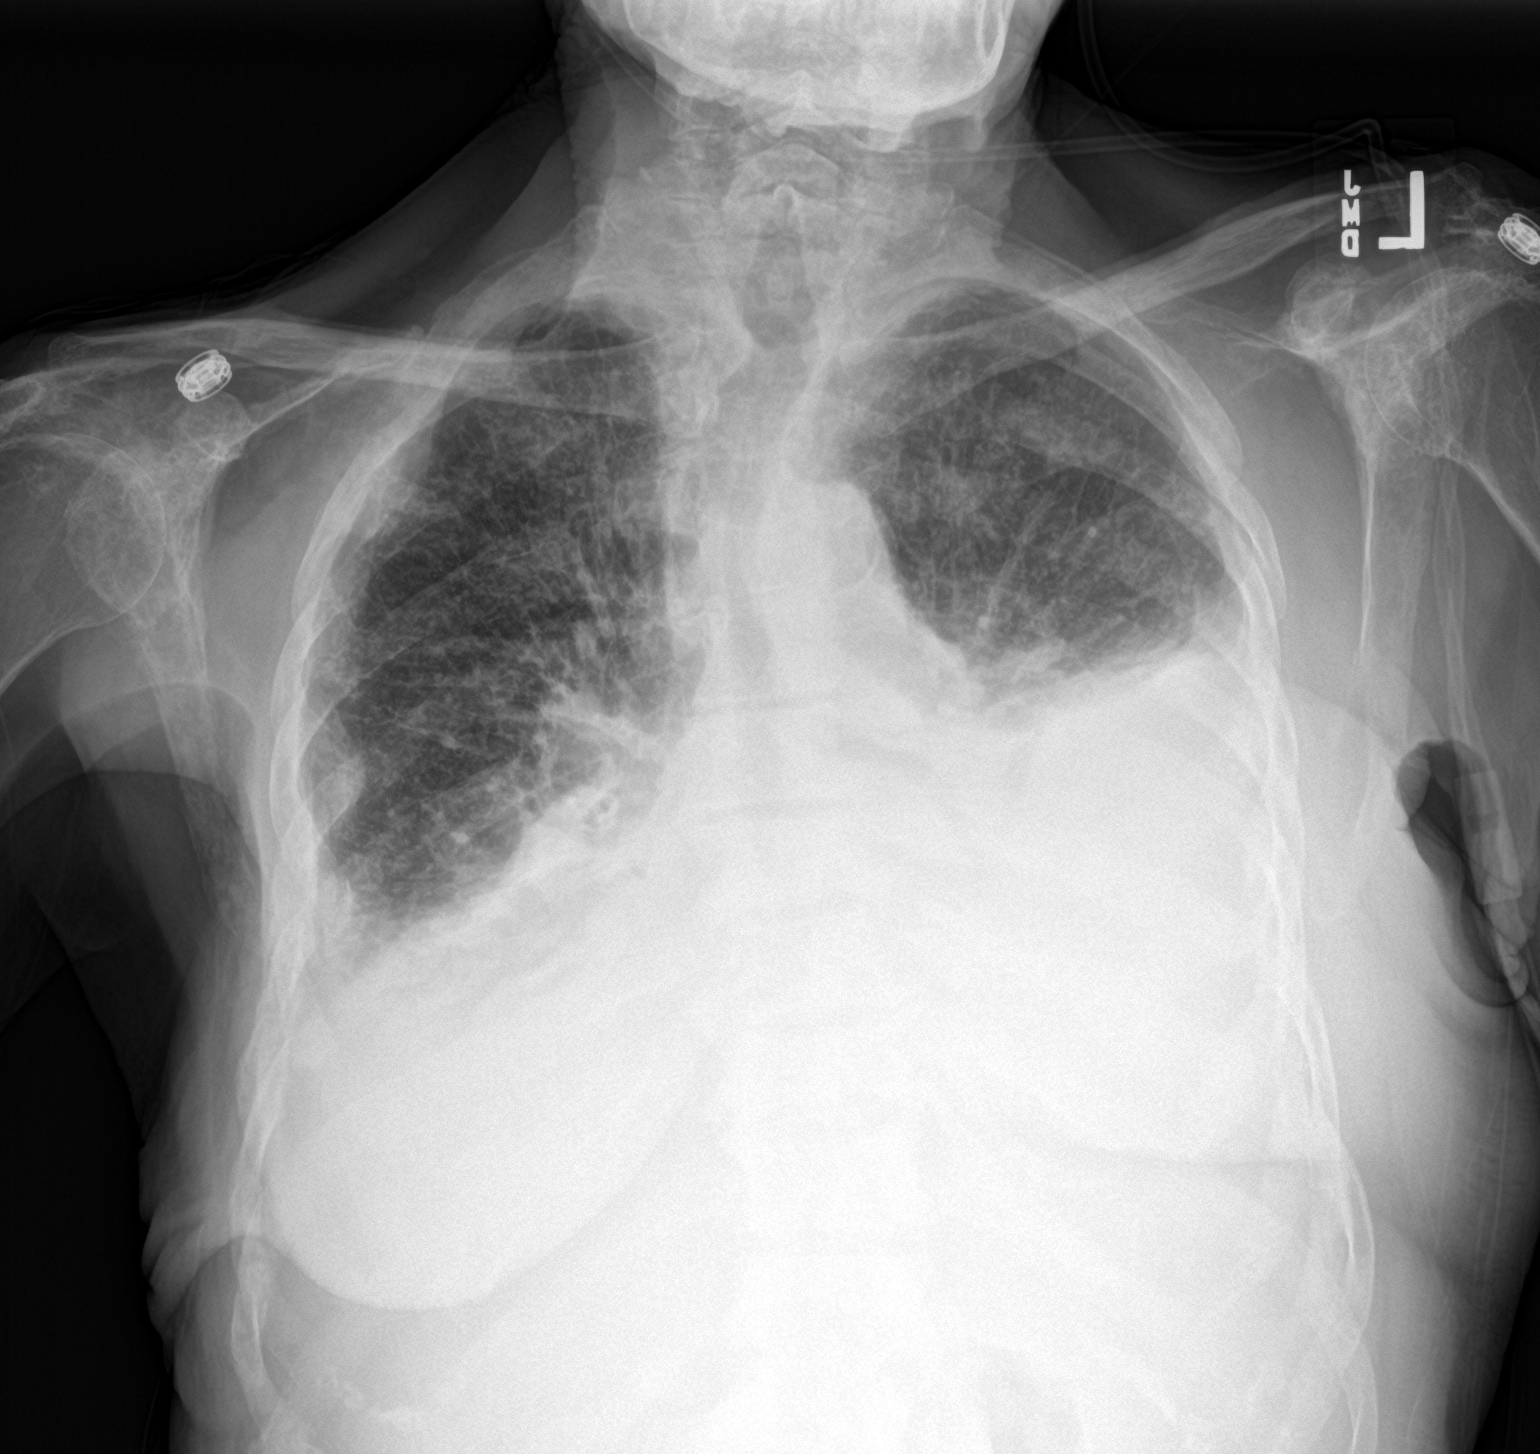

[chest lat]
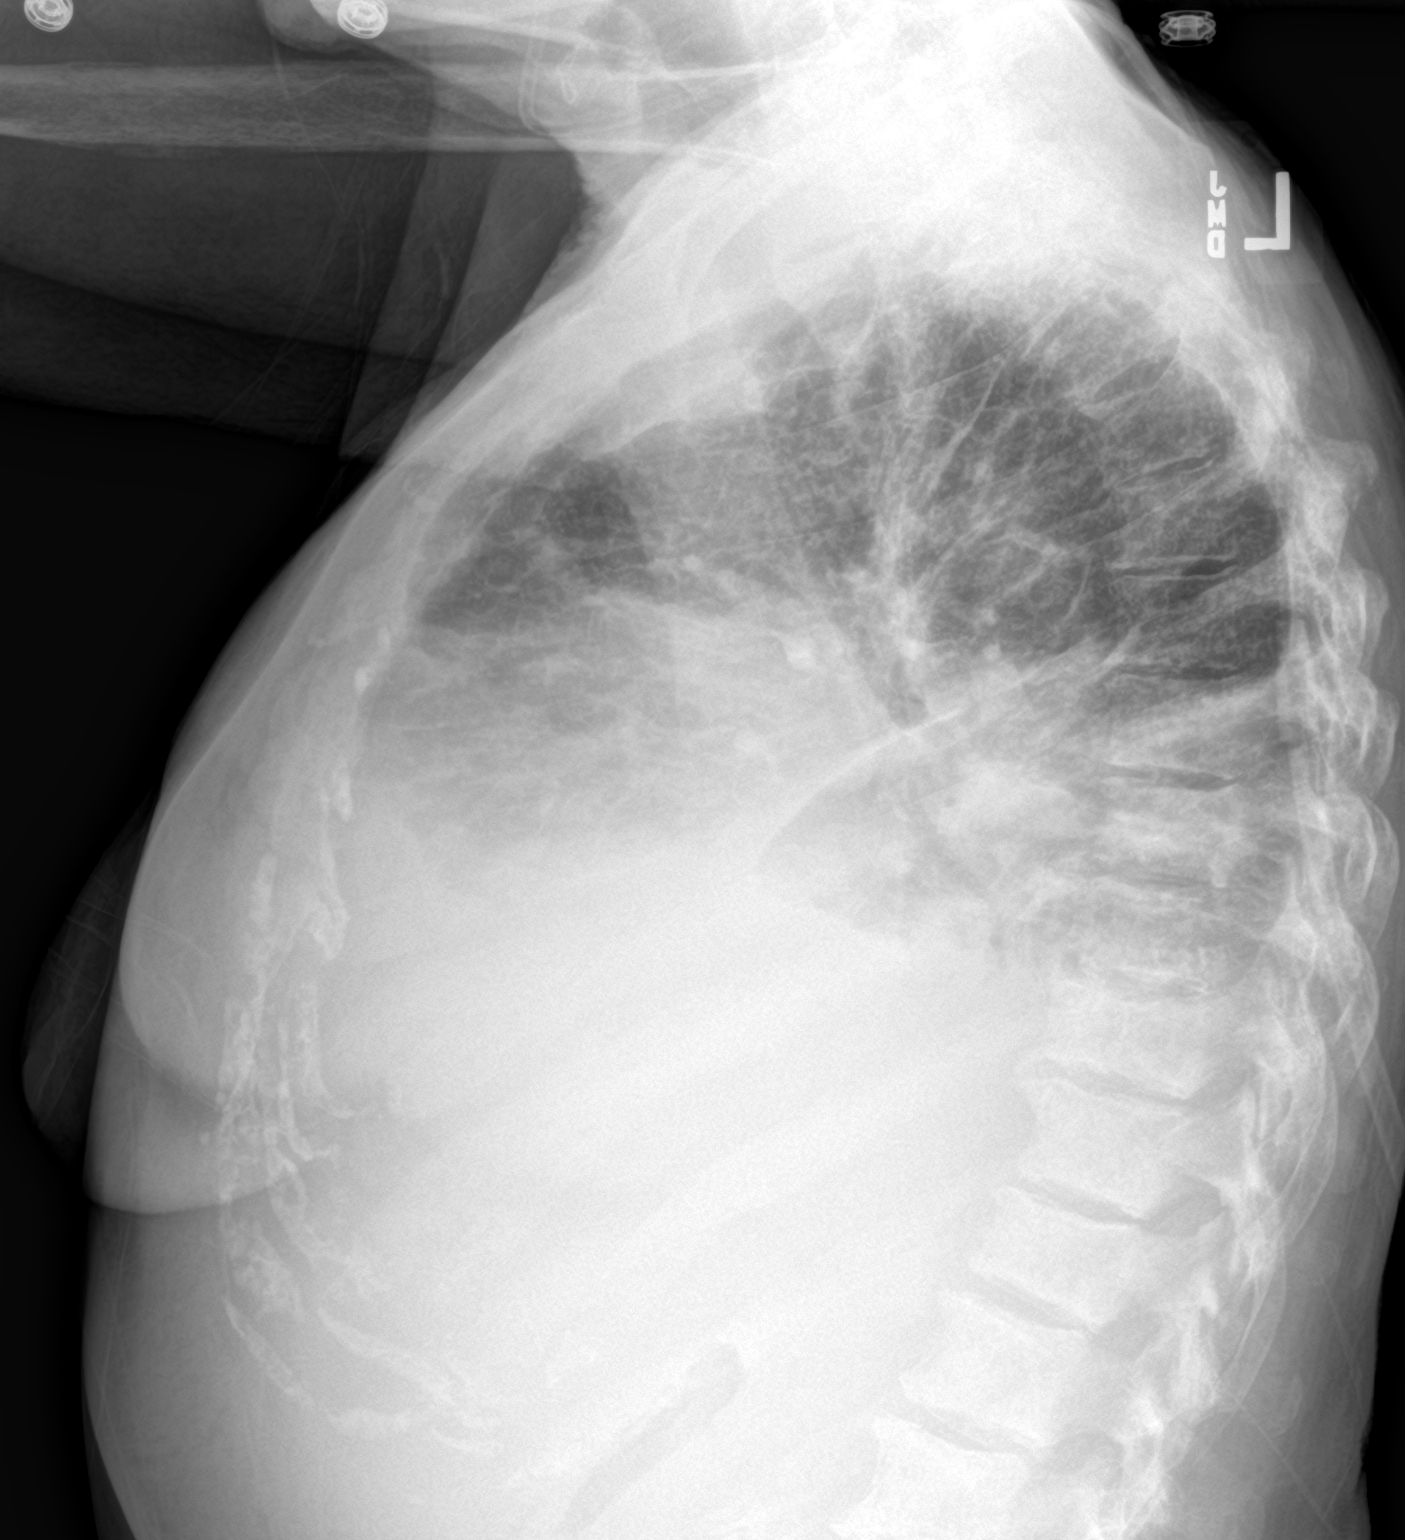

[2 of 2 positions shown; findings below may reference images not displayed]

FINDINGS: Pleural effusions, larger on the left than on the right, essentially
stable compared to prior study. Airspace opacity in portions of the
inferior lingula and left lower lobe are again noted as is
atelectatic change in the right base. Heart size and pulmonary
vascularity are within normal limits. No adenopathy appreciable.
There is aortic atherosclerosis. Multiple sclerotic bony metastases
again noted.
IMPRESSION: Persistent pleural effusions, larger on the left than on the right,
stable. Lower lung region atelectatic change with suspected
superimposed consolidation left lower lobe and potentially in a
portion of the inferior lingula. Stable cardiac silhouette.
Sclerotic bony metastases consistent with known breast carcinoma.
Aortic Atherosclerosis ([id number]).
